# Patient Record
Sex: Female | Born: 1955 | Race: White | Hispanic: No | Marital: Married | Smoking: Never smoker
Health system: Southern US, Community
[De-identification: ages and names within clinical notes are randomized; demographics above are authoritative.]

## PROBLEM LIST (undated history)

## (undated) DIAGNOSIS — E0590 Thyrotoxicosis, unspecified without thyrotoxic crisis or storm: Secondary | ICD-10-CM

## (undated) DIAGNOSIS — F329 Major depressive disorder, single episode, unspecified: Secondary | ICD-10-CM

## (undated) DIAGNOSIS — N12 Tubulo-interstitial nephritis, not specified as acute or chronic: Secondary | ICD-10-CM

## (undated) DIAGNOSIS — E785 Hyperlipidemia, unspecified: Secondary | ICD-10-CM

## (undated) DIAGNOSIS — K219 Gastro-esophageal reflux disease without esophagitis: Secondary | ICD-10-CM

## (undated) DIAGNOSIS — C4491 Basal cell carcinoma of skin, unspecified: Secondary | ICD-10-CM

## (undated) DIAGNOSIS — T7840XA Allergy, unspecified, initial encounter: Secondary | ICD-10-CM

## (undated) DIAGNOSIS — G709 Myoneural disorder, unspecified: Secondary | ICD-10-CM

## (undated) HISTORY — DX: Hyperlipidemia, unspecified: E78.5

## (undated) HISTORY — DX: Tubulo-interstitial nephritis, not specified as acute or chronic: N12

## (undated) HISTORY — DX: Gastro-esophageal reflux disease without esophagitis: K21.9

## (undated) HISTORY — DX: Basal cell carcinoma of skin, unspecified: C44.91

## (undated) HISTORY — DX: Major depressive disorder, single episode, unspecified: F32.9

## (undated) HISTORY — PX: EYE SURGERY: SHX253

## (undated) HISTORY — PX: OTHER SURGICAL HISTORY: SHX169

## (undated) HISTORY — DX: Myoneural disorder, unspecified: G70.9

## (undated) HISTORY — DX: Thyrotoxicosis, unspecified without thyrotoxic crisis or storm: E05.90

## (undated) HISTORY — DX: Allergy, unspecified, initial encounter: T78.40XA

## (undated) HISTORY — PX: DILATION AND CURETTAGE OF UTERUS: SHX78

---
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
## 1983-01-28 DIAGNOSIS — N12 Tubulo-interstitial nephritis, not specified as acute or chronic: Secondary | ICD-10-CM

## 1983-01-28 HISTORY — DX: Tubulo-interstitial nephritis, not specified as acute or chronic: N12

## 2011-03-16 LAB — HM MAMMOGRAPHY

## 2011-03-16 LAB — HM PAP SMEAR

## 2011-03-21 DIAGNOSIS — K219 Gastro-esophageal reflux disease without esophagitis: Secondary | ICD-10-CM | POA: Insufficient documentation

## 2011-03-21 DIAGNOSIS — F411 Generalized anxiety disorder: Secondary | ICD-10-CM | POA: Insufficient documentation

## 2011-03-21 HISTORY — DX: Generalized anxiety disorder: F41.1

## 2012-01-05 DIAGNOSIS — N950 Postmenopausal bleeding: Secondary | ICD-10-CM | POA: Insufficient documentation

## 2012-01-05 DIAGNOSIS — B9689 Other specified bacterial agents as the cause of diseases classified elsewhere: Secondary | ICD-10-CM | POA: Insufficient documentation

## 2012-01-19 DIAGNOSIS — D219 Benign neoplasm of connective and other soft tissue, unspecified: Secondary | ICD-10-CM | POA: Insufficient documentation

## 2012-03-09 ENCOUNTER — Encounter: Payer: Self-pay | Admitting: *Deleted

## 2012-03-09 ENCOUNTER — Other Ambulatory Visit: Payer: Self-pay | Admitting: *Deleted

## 2012-03-11 ENCOUNTER — Ambulatory Visit: Payer: Self-pay | Admitting: Internal Medicine

## 2012-03-15 ENCOUNTER — Encounter: Payer: Self-pay | Admitting: Internal Medicine

## 2012-03-15 ENCOUNTER — Ambulatory Visit (INDEPENDENT_AMBULATORY_CARE_PROVIDER_SITE_OTHER): Payer: BC Managed Care – PPO | Admitting: Internal Medicine

## 2012-03-15 VITALS — BP 120/80 | HR 54 | Temp 98.3°F | Ht 67.71 in | Wt 153.0 lb

## 2012-03-15 DIAGNOSIS — E0590 Thyrotoxicosis, unspecified without thyrotoxic crisis or storm: Secondary | ICD-10-CM

## 2012-03-15 DIAGNOSIS — R5381 Other malaise: Secondary | ICD-10-CM

## 2012-03-15 DIAGNOSIS — C4491 Basal cell carcinoma of skin, unspecified: Secondary | ICD-10-CM

## 2012-03-15 DIAGNOSIS — R5383 Other fatigue: Secondary | ICD-10-CM

## 2012-03-15 DIAGNOSIS — E7800 Pure hypercholesterolemia, unspecified: Secondary | ICD-10-CM

## 2012-03-15 DIAGNOSIS — Z1211 Encounter for screening for malignant neoplasm of colon: Secondary | ICD-10-CM

## 2012-03-17 ENCOUNTER — Encounter: Payer: Self-pay | Admitting: Internal Medicine

## 2012-03-17 ENCOUNTER — Telehealth: Payer: Self-pay | Admitting: Internal Medicine

## 2012-03-17 DIAGNOSIS — C4491 Basal cell carcinoma of skin, unspecified: Secondary | ICD-10-CM | POA: Insufficient documentation

## 2012-03-17 DIAGNOSIS — E0590 Thyrotoxicosis, unspecified without thyrotoxic crisis or storm: Secondary | ICD-10-CM | POA: Insufficient documentation

## 2012-03-17 NOTE — Assessment & Plan Note (Signed)
Off tapazole.  Thyroid function just checked and within normal limits.  Follow. Sees endocrinology.

## 2012-03-17 NOTE — Assessment & Plan Note (Signed)
Followed by dermatology.  Up to date with skin checks.

## 2012-03-17 NOTE — Telephone Encounter (Signed)
Lab appointment 2/25 office visit 6/23 pt aware of both appointments

## 2012-03-17 NOTE — Telephone Encounter (Signed)
Pt left without scheduling appt or labs.  Please schedule her for a fasting lab appt within the next 2 weeks.  Also schedule her for a 4 month follow up (30 min)

## 2012-03-17 NOTE — Progress Notes (Signed)
Subjective:    Patient ID: Amber Richardson, female    DOB: 03-03-1955, 57 y.o.   MRN: [id number]  HPI 57 year old female with past history of hyperthyroidism and basal cell carcinoma who comes in today to follow up on these issues as well as to establish care.  She has been having some issues with increased congestion.  Ears - in barrell.  Throat on fire.  Some increased coughing.  Some congestion with mucus production.  Colored mucus production.  Swallowing ok.  No chest congestion.  No sob.  Symptoms progressing.  She has been seeing an endocrinologist for her thyroid.  Was diagnosed with Graves disease.  Was on Tapazole.  Now off.  Thyroid function just checked and wnl.  She reports some increased fatigue and decreased energy.  Hands and feet cold.  More irritable.  Gets angry - quicker.  Dry skin.  Cold all of the time.  Has been seeing GYN (Dr Agapito Games).  Had previously had some post menopausal spotting.  Had work up - negative.  Some pain with intercourse.  Dryness.  Has cervical prolapse.     Past Medical History  Diagnosis Date  . Pyelonephritis 1985  . Hyperthyroidism   . GERD (gastroesophageal reflux disease)   . Allergy   . Basal cell carcinoma     Outpatient Encounter Prescriptions as of 03/15/2012  Medication Sig Dispense Refill  . ALPRAZolam (XANAX) 0.25 MG tablet Take 0.25 mg by mouth at bedtime as needed for sleep.      . citalopram (CELEXA) 20 MG tablet Take 20 mg by mouth daily.      Marland Kitchen ibuprofen (ADVIL,MOTRIN) 600 MG tablet Take 600 mg by mouth as needed for pain.      . Multiple Vitamin (MULTIVITAMIN) tablet Take 1 tablet by mouth daily.      . valACYclovir (VALTREX) 500 MG tablet Take one to two tablets by mouth daily      . Prenatal Vit-Fe Fumarate-FA (PRENATAL MULTIVITAMIN) TABS Take 1 tablet by mouth daily.       No facility-administered encounter medications on file as of 03/15/2012.    Review of Systems Patient denies any headache, lightheadedness or  dizziness.  She does report the increased congestion, sinus issues and sore throat as outlined.  No chest pain, tightness or palpitations.  No increased shortness of breath.  No chest congestion.   No nausea or vomiting.  No acid reflux.  No abdominal pain or cramping.  No bowel change, such as diarrhea, constipation, BRBPR or melana.  No urine change.  Some vaginal dryness as outlined.  No further spotting.  Decreased energy.       Objective:   Physical Exam Filed Vitals:   03/15/12 1618  BP: 120/80  Pulse: 54  Temp: 98.3 F (36.8 C)   Pulse 2  57 year old female in no acute distress.   HEENT:  Nares- clear/slightly erythematous turbinates.   Oropharynx - without lesions.  TMs without erythema.  No significant tenderness to palpation over the sinus.  NECK:  Supple.  Nontender.  No audible bruit.  HEART:  Appears to be regular. LUNGS:  No crackles or wheezing audible.  Respirations even and unlabored.  RADIAL PULSE:  Equal bilaterally.    BREASTS:  Exam performed by gyn.  ABDOMEN:  Soft, nontender.  Bowel sounds present and normal.  No audible abdominal bruit.  GU:  Exam performed by gyn.    EXTREMITIES:  No increased edema present.  DP pulses  palpable and equal bilaterally.   SKIN:  No increased erythema or discoloration of the lower extremities.           Assessment & Plan:  FATIGUE.  Worsening.  See above.  Will check cbc, met c and tsh.    CARDIOVASCULAR.  No chest pain or tightness.  Currently asymptomatic.   PROBABLE SINUSITIS/URI.  Will have her use saline nasal spray and restart Flonase.  Gave her a rx for amoxicillin.  If progression or worsening or symptoms, can start abx.    PSYCH.  Describes being more irritable.  Increased stress.  Takes xanax regularly.  On citalopram.  She just saw her therapist today [employer]).  Plans to increase her citalopram to 20mg  q day.  Follow.    MSK.  Describes cold feet and hands.  Circulation appears to be ok.  No skin changes  visible.  Check ESR.    HEALTH MAINTENANCE.  Gets her breast, pelvic and pap smears through gyn.  Mammograms through there as well.  Has not had a colonoscopy.  Will refer for screening colonoscopy.

## 2012-03-23 ENCOUNTER — Other Ambulatory Visit (INDEPENDENT_AMBULATORY_CARE_PROVIDER_SITE_OTHER): Payer: BC Managed Care – PPO

## 2012-03-23 DIAGNOSIS — R5381 Other malaise: Secondary | ICD-10-CM

## 2012-03-23 DIAGNOSIS — E7800 Pure hypercholesterolemia, unspecified: Secondary | ICD-10-CM

## 2012-03-23 LAB — COMPREHENSIVE METABOLIC PANEL
ALT: 23 U/L (ref 0–35)
AST: 28 U/L (ref 0–37)
Alkaline Phosphatase: 76 U/L (ref 39–117)
CO2: 29 mEq/L (ref 19–32)
Chloride: 105 mEq/L (ref 96–112)
Potassium: 4.6 mEq/L (ref 3.5–5.1)
Total Bilirubin: 0.6 mg/dL (ref 0.3–1.2)
Total Protein: 6.8 g/dL (ref 6.0–8.3)

## 2012-03-23 LAB — CBC WITH DIFFERENTIAL/PLATELET
Basophils Absolute: 0 10*3/uL (ref 0.0–0.1)
HCT: 39.1 % (ref 36.0–46.0)
Lymphs Abs: 2.4 10*3/uL (ref 0.7–4.0)
MCV: 96.5 fl (ref 78.0–100.0)
Monocytes Absolute: 0.5 10*3/uL (ref 0.1–1.0)
Platelets: 203 10*3/uL (ref 150.0–400.0)
RDW: 12.7 % (ref 11.5–14.6)

## 2012-03-23 LAB — LDL CHOLESTEROL, DIRECT: Direct LDL: 165.6 mg/dL

## 2012-03-23 LAB — LIPID PANEL
Cholesterol: 261 mg/dL — ABNORMAL HIGH (ref 0–200)
Total CHOL/HDL Ratio: 3

## 2012-05-10 ENCOUNTER — Ambulatory Visit: Payer: Self-pay | Admitting: Internal Medicine

## 2012-07-19 ENCOUNTER — Encounter: Payer: Self-pay | Admitting: Internal Medicine

## 2012-07-19 ENCOUNTER — Ambulatory Visit (INDEPENDENT_AMBULATORY_CARE_PROVIDER_SITE_OTHER): Payer: BC Managed Care – PPO | Admitting: Internal Medicine

## 2012-07-19 VITALS — BP 110/70 | HR 62 | Temp 98.0°F | Ht 67.71 in | Wt 152.2 lb

## 2012-07-19 DIAGNOSIS — E7800 Pure hypercholesterolemia, unspecified: Secondary | ICD-10-CM

## 2012-07-19 DIAGNOSIS — C4491 Basal cell carcinoma of skin, unspecified: Secondary | ICD-10-CM

## 2012-07-19 DIAGNOSIS — E0590 Thyrotoxicosis, unspecified without thyrotoxic crisis or storm: Secondary | ICD-10-CM

## 2012-07-19 MED ORDER — METAXALONE 800 MG PO TABS: 800.0000 mg | ORAL_TABLET | Freq: Every evening | ORAL | Status: DC | PRN

## 2012-07-30 ENCOUNTER — Encounter: Payer: Self-pay | Admitting: Internal Medicine

## 2012-07-30 NOTE — Progress Notes (Signed)
Subjective:    Patient ID: Amber Richardson, female    DOB: 1955-05-22, 57 y.o.   MRN: [id number]  HPI 57 year old female with past history of hyperthyroidism and basal cell carcinoma who comes in today for a scheduled follow up.  No chest congestion.  No sob.  She has been seeing an endocrinologist for her thyroid.  Was diagnosed with Graves disease.  Was on Tapazole.  Now off.  Thyroid function just checked and wnl.  Last visit we decreased her citalopram to 10mg  q day.  She feels better on this dose.  Work is better.  She did get hit with a shelf.  A shelf fell down stairs and she was knocked on her back.  No head injury.  (Workman's comp).  Still with pain in her upper shoulders, neck and mid back.  She was evaluated.  Had xray T-spine, C-spine, L-spine, right forearm and right hip.  Has been taking Flexeril and Naproxen.  Tolerates skelaxin better.  Went to physical therapy for a few weeks.       Past Medical History  Diagnosis Date  . Pyelonephritis 1985  . Hyperthyroidism   . GERD (gastroesophageal reflux disease)   . Allergy   . Basal cell carcinoma     Outpatient Encounter Prescriptions as of 07/19/2012  Medication Sig Dispense Refill  . ALPRAZolam (XANAX) 0.25 MG tablet Take 0.25 mg by mouth at bedtime as needed for sleep.      . citalopram (CELEXA) 20 MG tablet Take 20 mg by mouth daily.      Marland Kitchen ibuprofen (ADVIL,MOTRIN) 600 MG tablet Take 600 mg by mouth as needed for pain.      . Multiple Vitamin (MULTIVITAMIN) tablet Take 1 tablet by mouth daily.      . valACYclovir (VALTREX) 500 MG tablet Take one to two tablets by mouth daily      . metaxalone (SKELAXIN) 800 MG tablet Take 1 tablet (800 mg total) by mouth at bedtime as needed for pain.  30 tablet  0  . [DISCONTINUED] Prenatal Vit-Fe Fumarate-FA (PRENATAL MULTIVITAMIN) TABS Take 1 tablet by mouth daily.       No facility-administered encounter medications on file as of 07/19/2012.    Review of Systems Patient denies any  headache, lightheadedness or dizziness.  Previous congestion has resolved.  No chest pain, tightness or palpitations.  No increased shortness of breath.  No chest congestion.   No nausea or vomiting.  No acid reflux.  No abdominal pain or cramping.  No bowel change, such as diarrhea, constipation, BRBPR or melana.  No urine change.  Feels better since decreasing the dose of citalopram.  Persistent shoulder, neck and mid back pain as outlined.         Objective:   Physical Exam  Filed Vitals:   07/19/12 1130  BP: 110/70  Pulse: 62  Temp: 98 F (18.32 C)   57 year old female in no acute distress.   HEENT:  Nares- clear.   Oropharynx - without lesions.   NECK:  Supple.  Nontender.  No audible bruit.  HEART:  Appears to be regular. LUNGS:  No crackles or wheezing audible.  Respirations even and unlabored.  RADIAL PULSE:  Equal bilaterally.   ABDOMEN:  Soft, nontender.  Bowel sounds present and normal.  No audible abdominal bruit.    EXTREMITIES:  No increased edema present.  DP pulses palpable and equal bilaterally.   MSK:  Some minimal discomfort with palpation over the neck  and upper back.  Some minimal discomfort with rotation of her head.           Assessment & Plan:  FATIGUE.  Better since decreasing citalopram dose.     CARDIOVASCULAR.  No chest pain or tightness.  Currently asymptomatic.    PSYCH.  On citalopram.  Feeling better on the lower dose.  Sees a therapist [employer]).  Work is better.  Follow.   MSK.  Persistent pain s/p injury.  Wants to continue to monitor.  Will notify me if desires any further testing or work up.       HEALTH MAINTENANCE.  Gets her breast, pelvic and pap smears through gyn.  Mammograms through there as well.  Has not had a colonoscopy.  Was referred for screening colonoscopy.

## 2012-07-30 NOTE — Assessment & Plan Note (Signed)
Followed by dermatology.  Up to date with skin checks.

## 2012-07-30 NOTE — Assessment & Plan Note (Signed)
Off tapazole.  Thyroid function just checked and within normal limits.  Follow. Sees endocrinology.

## 2012-09-08 LAB — HM COLONOSCOPY

## 2012-09-28 ENCOUNTER — Encounter: Payer: Self-pay | Admitting: Internal Medicine

## 2012-09-28 DIAGNOSIS — M6780 Other specified disorders of synovium and tendon, unspecified site: Secondary | ICD-10-CM

## 2012-10-14 ENCOUNTER — Encounter: Payer: Self-pay | Admitting: Internal Medicine

## 2012-12-02 ENCOUNTER — Other Ambulatory Visit: Payer: Self-pay

## 2012-12-16 ENCOUNTER — Ambulatory Visit: Payer: Self-pay | Admitting: Unknown Physician Specialty

## 2013-09-12 DIAGNOSIS — M6780 Other specified disorders of synovium and tendon, unspecified site: Secondary | ICD-10-CM

## 2013-09-12 HISTORY — DX: Other specified disorders of synovium and tendon, unspecified site: M67.80

## 2014-08-29 ENCOUNTER — Other Ambulatory Visit: Payer: Self-pay | Admitting: Unknown Physician Specialty

## 2014-08-29 ENCOUNTER — Encounter: Payer: Self-pay | Admitting: Internal Medicine

## 2014-08-29 ENCOUNTER — Ambulatory Visit (INDEPENDENT_AMBULATORY_CARE_PROVIDER_SITE_OTHER): Payer: 59 | Admitting: Internal Medicine

## 2014-08-29 VITALS — BP 110/70 | HR 52 | Temp 98.0°F | Ht 67.5 in | Wt 154.2 lb

## 2014-08-29 DIAGNOSIS — Z13220 Encounter for screening for lipoid disorders: Secondary | ICD-10-CM

## 2014-08-29 DIAGNOSIS — M79661 Pain in right lower leg: Secondary | ICD-10-CM

## 2014-08-29 DIAGNOSIS — R1084 Generalized abdominal pain: Secondary | ICD-10-CM

## 2014-08-29 DIAGNOSIS — E0590 Thyrotoxicosis, unspecified without thyrotoxic crisis or storm: Secondary | ICD-10-CM

## 2014-08-29 DIAGNOSIS — R109 Unspecified abdominal pain: Secondary | ICD-10-CM | POA: Insufficient documentation

## 2014-08-29 DIAGNOSIS — R079 Chest pain, unspecified: Secondary | ICD-10-CM | POA: Diagnosis not present

## 2014-08-29 DIAGNOSIS — Z658 Other specified problems related to psychosocial circumstances: Secondary | ICD-10-CM | POA: Diagnosis not present

## 2014-08-29 DIAGNOSIS — F439 Reaction to severe stress, unspecified: Secondary | ICD-10-CM

## 2014-08-29 DIAGNOSIS — Z0000 Encounter for general adult medical examination without abnormal findings: Secondary | ICD-10-CM

## 2014-08-29 DIAGNOSIS — R5383 Other fatigue: Secondary | ICD-10-CM

## 2014-08-29 LAB — CBC WITH DIFFERENTIAL/PLATELET
BASOS ABS: 0 10*3/uL (ref 0.0–0.1)
BASOS PCT: 0.6 % (ref 0.0–3.0)
EOS ABS: 0 10*3/uL (ref 0.0–0.7)
Eosinophils Relative: 0.6 % (ref 0.0–5.0)
HCT: 42.1 % (ref 36.0–46.0)
Hemoglobin: 13.9 g/dL (ref 12.0–15.0)
LYMPHS PCT: 40.5 % (ref 12.0–46.0)
Lymphs Abs: 2.2 10*3/uL (ref 0.7–4.0)
MCHC: 33 g/dL (ref 30.0–36.0)
MCV: 97 fl (ref 78.0–100.0)
MONOS PCT: 8.4 % (ref 3.0–12.0)
Monocytes Absolute: 0.4 10*3/uL (ref 0.1–1.0)
Neutro Abs: 2.7 10*3/uL (ref 1.4–7.7)
Neutrophils Relative %: 49.9 % (ref 43.0–77.0)
PLATELETS: 212 10*3/uL (ref 150.0–400.0)
RBC: 4.34 Mil/uL (ref 3.87–5.11)
RDW: 13.7 % (ref 11.5–15.5)
WBC: 5.4 10*3/uL (ref 4.0–10.5)

## 2014-08-29 LAB — HEPATIC FUNCTION PANEL
ALBUMIN: 4.7 g/dL (ref 3.5–5.2)
ALT: 30 U/L (ref 0–35)
AST: 31 U/L (ref 0–37)
Alkaline Phosphatase: 107 U/L (ref 39–117)
BILIRUBIN TOTAL: 0.7 mg/dL (ref 0.2–1.2)
Bilirubin, Direct: 0.1 mg/dL (ref 0.0–0.3)
Total Protein: 7 g/dL (ref 6.0–8.3)

## 2014-08-29 LAB — AMYLASE: Amylase: 43 U/L (ref 27–131)

## 2014-08-29 LAB — BASIC METABOLIC PANEL
BUN: 14 mg/dL (ref 6–23)
CHLORIDE: 100 meq/L (ref 96–112)
CO2: 33 mEq/L — ABNORMAL HIGH (ref 19–32)
CREATININE: 0.76 mg/dL (ref 0.40–1.20)
Calcium: 9.6 mg/dL (ref 8.4–10.5)
GFR: 83.48 mL/min (ref >60.00)
GLUCOSE: 76 mg/dL (ref 70–99)
POTASSIUM: 4.5 meq/L (ref 3.5–5.1)
SODIUM: 138 meq/L (ref 135–145)

## 2014-08-29 LAB — LIPASE: LIPASE: 21 U/L (ref 11.0–59.0)

## 2014-08-29 LAB — LIPID PANEL
Cholesterol: 292 mg/dL — ABNORMAL HIGH (ref 0–200)
HDL: 77.3 mg/dL (ref >39.00)
LDL CALC: 185 mg/dL — AB (ref 0–99)
NonHDL: 214.27
Total CHOL/HDL Ratio: 4
Triglycerides: 144 mg/dL (ref 0.0–149.0)
VLDL: 28.8 mg/dL (ref 0.0–40.0)

## 2014-08-29 LAB — TSH: TSH: 4.91 u[IU]/mL — AB (ref 0.35–4.50)

## 2014-08-29 NOTE — Progress Notes (Signed)
Pre visit review using our clinic review tool, if applicable. No additional management support is needed unless otherwise documented below in the visit note. 

## 2014-08-29 NOTE — Progress Notes (Signed)
Patient ID: Amber Richardson, female   DOB: August 31, 1955, 59 y.o.   MRN: [id number]   Subjective:    Patient ID: Amber Richardson, female    DOB: 05/14/1955, 59 y.o.   MRN: [id number]  HPI  Patient here for a follow up.  She was scheduled for a physical, but she sees gyn for her breast, pelvic and pap smears.  Is up to date.  Has f/u planned in 09/2014.  She is seeing psychiatry.  On cymbalta now.  Takes xanax bid.  Feels she is doing better on the cymbalta.  She has developed RUQ pain and right side pain.  She also describes central pelvic pain.  Had urinalysis yesterday - negative.  No nausea or vomiting.  No bowel change.  She also reports increased fatigue, weight gain and has been more angry and more irritable.  She also reports dry skin and thin nails.  Due to see Dr Tiffany Kocher this pm.  No reflux.  She also reports pain - right calf - localized pain.  Wears compression hose.  Previously noticed some chest heaviness. Fatigue as outlined.     Past Medical History  Diagnosis Date  . Pyelonephritis 1985  . Hyperthyroidism   . GERD (gastroesophageal reflux disease)   . Allergy   . Basal cell carcinoma     Outpatient Encounter Prescriptions as of 08/29/2014  Medication Sig  . ALPRAZolam (XANAX) 0.25 MG tablet Take 0.25 mg by mouth 3 (three) times daily as needed for anxiety or sleep.   . DULoxetine (CYMBALTA) 20 MG capsule Take 20 mg by mouth daily.  Marland Kitchen ibuprofen (ADVIL,MOTRIN) 600 MG tablet Take 600 mg by mouth as needed for pain.  . metaxalone (SKELAXIN) 800 MG tablet Take 1 tablet (800 mg total) by mouth at bedtime as needed for pain.  . Multiple Vitamin (MULTIVITAMIN) tablet Take 1 tablet by mouth daily.  . valACYclovir (VALTREX) 500 MG tablet Take one to two tablets by mouth daily  . [DISCONTINUED] citalopram (CELEXA) 20 MG tablet Take 20 mg by mouth daily.   No facility-administered encounter medications on file as of 08/29/2014.    Review of Systems  Constitutional: Positive for fatigue.         Concerned about weight gain.    HENT: Negative for congestion and sinus pressure.   Respiratory: Negative for cough, chest tightness and shortness of breath.   Cardiovascular: Negative for chest pain, palpitations and leg swelling.  Gastrointestinal: Positive for abdominal pain (RUQ and right side pain.  ). Negative for nausea, vomiting and diarrhea.  Genitourinary: Negative for dysuria and difficulty urinating.  Musculoskeletal: Negative for joint swelling and neck pain.  Skin: Negative for color change and rash.  Neurological: Negative for dizziness, light-headedness and headaches.  Psychiatric/Behavioral: Negative for dysphoric mood and agitation.       Objective:    Physical Exam  Constitutional: She appears well-developed and well-nourished. No distress.  HENT:  Nose: Nose normal.  Mouth/Throat: Oropharynx is clear and moist.  Neck: Neck supple. No thyromegaly present.  Cardiovascular: Normal rate and regular rhythm.   Pulmonary/Chest: Breath sounds normal. No respiratory distress. She has no wheezes.  Abdominal: Soft. Bowel sounds are normal.  Minimal tenderness to palpation - right side.   Musculoskeletal: She exhibits no edema or tenderness.  Lymphadenopathy:    She has no cervical adenopathy.  Skin: No rash noted. No erythema.  Psychiatric: She has a normal mood and affect. Her behavior is normal.    BP 110/70 mmHg  Pulse  52  Temp(Src) 98 F (36.7 C) (Oral)  Ht 5' 7.5" (1.715 m)  Wt 154 lb 4 oz (69.967 kg)  BMI 23.79 kg/m2  SpO2 98%  LMP 01/13/2008 Wt Readings from Last 3 Encounters:  08/29/14 154 lb 4 oz (69.967 kg)  07/19/12 152 lb 4 oz (69.06 kg)  03/15/12 153 lb (69.4 kg)     Lab Results  Component Value Date   WBC 5.4 08/29/2014   HGB 13.9 08/29/2014   HCT 42.1 08/29/2014   PLT 212.0 08/29/2014   GLUCOSE 76 08/29/2014   CHOL 292* 08/29/2014   TRIG 144.0 08/29/2014   HDL 77.30 08/29/2014   LDLDIRECT 165.6 03/23/2012   LDLCALC 185*  08/29/2014   ALT 30 08/29/2014   AST 31 08/29/2014   NA 138 08/29/2014   K 4.5 08/29/2014   CL 100 08/29/2014   CREATININE 0.76 08/29/2014   BUN 14 08/29/2014   CO2 33* 08/29/2014   TSH 4.91* 08/29/2014       Assessment & Plan:   Problem List Items Addressed This Visit    Abdominal pain - Primary   Relevant Orders   TSH (Completed)   CBC with Differential/Platelet (Completed)   Hepatic function panel (Completed)   Basic metabolic panel (Completed)   Amylase (Completed)   Lipase (Completed)   Chest pain    Chest tightness as outlined.  Discussed further cardiac w/up.  Will refer to cardiology for further evaluation and testing.   Has strong family history.        Relevant Orders   EKG 12-Lead (Completed)   Ambulatory referral to Cardiology   Fatigue    Check cbc, metabolic panel and thyroid test.  Further w/up for abdominal pain as outlined.        Health care maintenance    Sees gyn.  Up to date with breast, pelvic and pap smears.  Also states up to date with mammograms.  Will need to get records.        Hyperthyroidism    Off tapazole.  Check thyroid function tests.        Right calf pain    No erythema.  No significant pain to palpation.  Continue support hose.  Follow.        Right sided abdominal pain    Persistent pain.  Symptoms as outlined.  Discussed further w/up.  We discussed CT.  She is seeing Dr Tiffany Kocher this pm.  Will discuss further w/up with him.  Check cbc, liver panel, pancreas tests, etc.        Stress    On cymbalta.  Seeing psychiatry.         Other Visit Diagnoses    Screening cholesterol level        Relevant Orders    Lipid panel (Completed)      I spent 40 minutes with the patient and more than 50% of the time was spent in consultation regarding the above.     Einar Pheasant, MD

## 2014-08-30 ENCOUNTER — Encounter: Payer: Self-pay | Admitting: Internal Medicine

## 2014-08-30 DIAGNOSIS — M79661 Pain in right lower leg: Secondary | ICD-10-CM | POA: Insufficient documentation

## 2014-08-30 DIAGNOSIS — F439 Reaction to severe stress, unspecified: Secondary | ICD-10-CM | POA: Insufficient documentation

## 2014-08-30 DIAGNOSIS — Z0000 Encounter for general adult medical examination without abnormal findings: Secondary | ICD-10-CM | POA: Insufficient documentation

## 2014-08-30 DIAGNOSIS — R5383 Other fatigue: Secondary | ICD-10-CM

## 2014-08-30 DIAGNOSIS — R079 Chest pain, unspecified: Secondary | ICD-10-CM | POA: Insufficient documentation

## 2014-08-30 DIAGNOSIS — R109 Unspecified abdominal pain: Secondary | ICD-10-CM | POA: Insufficient documentation

## 2014-08-30 HISTORY — DX: Other fatigue: R53.83

## 2014-08-30 NOTE — Assessment & Plan Note (Signed)
On cymbalta.  Seeing psychiatry.

## 2014-08-30 NOTE — Assessment & Plan Note (Signed)
Off tapazole.  Check thyroid function tests.

## 2014-08-30 NOTE — Assessment & Plan Note (Signed)
Chest tightness as outlined.  Discussed further cardiac w/up.  Will refer to cardiology for further evaluation and testing.   Has strong family history.

## 2014-08-30 NOTE — Assessment & Plan Note (Signed)
Persistent pain.  Symptoms as outlined.  Discussed further w/up.  We discussed CT.  She is seeing Dr Tiffany Kocher this pm.  Will discuss further w/up with him.  Check cbc, liver panel, pancreas tests, etc.

## 2014-08-30 NOTE — Assessment & Plan Note (Signed)
Sees gyn.  Up to date with breast, pelvic and pap smears.  Also states up to date with mammograms.  Will need to get records.

## 2014-08-30 NOTE — Assessment & Plan Note (Signed)
No erythema.  No significant pain to palpation.  Continue support hose.  Follow.

## 2014-08-30 NOTE — Assessment & Plan Note (Signed)
Check cbc, metabolic panel and thyroid test.  Further w/up for abdominal pain as outlined.

## 2014-09-01 NOTE — Telephone Encounter (Signed)
Unread mychart message mailed to patient 

## 2014-09-06 ENCOUNTER — Ambulatory Visit
Admission: RE | Admit: 2014-09-06 | Discharge: 2014-09-06 | Disposition: A | Discharge status: Home / Self Care | Payer: 59 | Source: Ambulatory Visit | Attending: Unknown Physician Specialty | Admitting: Unknown Physician Specialty

## 2014-09-06 DIAGNOSIS — R1084 Generalized abdominal pain: Secondary | ICD-10-CM | POA: Insufficient documentation

## 2014-09-20 DIAGNOSIS — N952 Postmenopausal atrophic vaginitis: Secondary | ICD-10-CM | POA: Insufficient documentation

## 2014-09-20 DIAGNOSIS — R102 Pelvic and perineal pain: Secondary | ICD-10-CM | POA: Insufficient documentation

## 2014-09-20 DIAGNOSIS — B009 Herpesviral infection, unspecified: Secondary | ICD-10-CM

## 2014-09-20 HISTORY — DX: Herpesviral infection, unspecified: B00.9

## 2014-10-27 ENCOUNTER — Encounter: Payer: Self-pay | Admitting: Internal Medicine

## 2014-10-27 ENCOUNTER — Ambulatory Visit
Admission: RE | Admit: 2014-10-27 | Discharge: 2014-10-27 | Disposition: A | Discharge status: Home / Self Care | Payer: 59 | Source: Ambulatory Visit | Attending: Internal Medicine | Admitting: Internal Medicine

## 2014-10-27 ENCOUNTER — Ambulatory Visit (INDEPENDENT_AMBULATORY_CARE_PROVIDER_SITE_OTHER): Payer: 59 | Admitting: Internal Medicine

## 2014-10-27 VITALS — BP 120/78 | HR 53 | Temp 97.9°F | Resp 18 | Ht 67.5 in | Wt 158.8 lb

## 2014-10-27 DIAGNOSIS — R1084 Generalized abdominal pain: Secondary | ICD-10-CM | POA: Diagnosis not present

## 2014-10-27 DIAGNOSIS — Z658 Other specified problems related to psychosocial circumstances: Secondary | ICD-10-CM | POA: Diagnosis not present

## 2014-10-27 DIAGNOSIS — M25572 Pain in left ankle and joints of left foot: Secondary | ICD-10-CM | POA: Diagnosis not present

## 2014-10-27 DIAGNOSIS — M79672 Pain in left foot: Secondary | ICD-10-CM

## 2014-10-27 DIAGNOSIS — F439 Reaction to severe stress, unspecified: Secondary | ICD-10-CM

## 2014-10-27 NOTE — Progress Notes (Signed)
Pre visit review using our clinic review tool, if applicable. No additional management support is needed unless otherwise documented below in the visit note. 

## 2014-10-27 NOTE — Progress Notes (Signed)
Patient ID: Amber Richardson, female   DOB: [birth year], 57 y.o.   MRN: [id number]   Subjective:    Patient ID: Amber Richardson, female    DOB: 15-Jan-1956, 59 y.o.   MRN: [id number]  HPI  Patient with past history of uterine fibroids and increased stress.  She comes in today for a scheduled follow up.  She is accompanied by her husband.  History obtained from both of them.  Her abdominal pain is better.  Saw gyn.  Had pelvic ultrasound.  Stable fibroids.  Had abdominal ultrasound with results as outlined.  Her main complaint now is that of left ler leg pain (ankle pain) and pain in her feet.  Describes burning around the left ankle.  Increased pain in her foot - with inversion.  No cardiac symptoms with increased activity or exertion.  No sob.     Past Medical History  Diagnosis Date  . Pyelonephritis 1985  . Hyperthyroidism   . GERD (gastroesophageal reflux disease)   . Allergy   . Basal cell carcinoma    Past Surgical History  Procedure Laterality Date  . Tonsillectomy  1963  . Miscarriage      x1  . Dilation and curettage of uterus     Family History  Problem Relation Age of Onset  . Hypertension Mother   . Heart disease Father   . Hyperlipidemia Father   . Hypercholesterolemia Father   . Hypercholesterolemia Mother    Social History   Social History  . Marital Status: Married    Spouse Name: N/A  . Number of Children: 0  . Years of Education: N/A   Social History Main Topics  . Smoking status: Never Smoker   . Smokeless tobacco: Never Used  . Alcohol Use: No  . Drug Use: No  . Sexual Activity: Not Asked   Other Topics Concern  . None   Social History Narrative    Outpatient Encounter Prescriptions as of 10/27/2014  Medication Sig  . ALPRAZolam (XANAX) 0.25 MG tablet Take 0.25 mg by mouth 3 (three) times daily as needed for anxiety or sleep.   . DULoxetine (CYMBALTA) 20 MG capsule Take 20 mg by mouth daily.  Marland Kitchen ibuprofen (ADVIL,MOTRIN) 600 MG tablet Take 600  mg by mouth as needed for pain.  . Multiple Vitamin (MULTIVITAMIN) tablet Take 1 tablet by mouth daily.  . valACYclovir (VALTREX) 500 MG tablet Take one to two tablets by mouth daily  . [DISCONTINUED] metaxalone (SKELAXIN) 800 MG tablet Take 1 tablet (800 mg total) by mouth at bedtime as needed for pain.   No facility-administered encounter medications on file as of 10/27/2014.    Review of Systems  Constitutional: Negative for appetite change and unexpected weight change.  HENT: Negative for congestion and sinus pressure.   Eyes: Negative for pain and visual disturbance.  Respiratory: Negative for cough, chest tightness and shortness of breath.   Cardiovascular: Negative for chest pain, palpitations and leg swelling.  Gastrointestinal: Negative for nausea, vomiting, abdominal pain and diarrhea.  Genitourinary: Negative for dysuria and difficulty urinating.  Musculoskeletal:       Increased foot and leg pain as outlined.    Skin: Negative for color change and rash.  Neurological: Negative for dizziness, light-headedness and headaches.  Psychiatric/Behavioral: Negative for dysphoric mood and agitation.       Increased stress with her job.        Objective:    Physical Exam  Constitutional: She appears well-developed and well-nourished. No distress.  HENT:  Nose: Nose normal.  Mouth/Throat: Oropharynx is clear and moist.  Eyes: Conjunctivae are normal. Right eye exhibits no discharge. Left eye exhibits no discharge.  Neck: Neck supple. No thyromegaly present.  Cardiovascular: Normal rate and regular rhythm.   Pulmonary/Chest: Breath sounds normal. No respiratory distress. She has no wheezes.  Abdominal: Soft. Bowel sounds are normal. There is no tenderness.  Musculoskeletal: She exhibits no edema or tenderness.  Lymphadenopathy:    She has no cervical adenopathy.  Skin: No rash noted. No erythema.  Psychiatric: She has a normal mood and affect. Her behavior is normal.    BP  120/78 mmHg  Pulse 53  Temp(Src) 97.9 F (36.6 C) (Oral)  Resp 18  Ht 5' 7.5" (1.715 m)  Wt 158 lb 12 oz (72.009 kg)  BMI 24.48 kg/m2  SpO2 96%  LMP 01/13/2008 Wt Readings from Last 3 Encounters:  10/27/14 158 lb 12 oz (72.009 kg)  08/29/14 154 lb 4 oz (69.967 kg)  07/19/12 152 lb 4 oz (69.06 kg)     Lab Results  Component Value Date   WBC 5.4 08/29/2014   HGB 13.9 08/29/2014   HCT 42.1 08/29/2014   PLT 212.0 08/29/2014   GLUCOSE 76 08/29/2014   CHOL 292* 08/29/2014   TRIG 144.0 08/29/2014   HDL 77.30 08/29/2014   LDLDIRECT 165.6 03/23/2012   LDLCALC 185* 08/29/2014   ALT 30 08/29/2014   AST 31 08/29/2014   NA 138 08/29/2014   K 4.5 08/29/2014   CL 100 08/29/2014   CREATININE 0.76 08/29/2014   BUN 14 08/29/2014   CO2 33* 08/29/2014   TSH 4.91* 08/29/2014    US Abdomen Complete  09/06/2014   CLINICAL DATA:  2-3 month history of generalized abdominal pain without nausea or vomiting.  EXAM: ULTRASOUND ABDOMEN COMPLETE  COMPARISON:  Abdominal and pelvic CT scan dated December 16, 2012  FINDINGS: Gallbladder: No gallstones or wall thickening visualized. No sonographic Murphy sign noted.  Common bile duct: Diameter: 1.6 cm  Liver: No focal lesion identified. Within normal limits in parenchymal echogenicity.  IVC: No abnormality visualized.  Pancreas: The pancreatic head and body are normal in echotexture and contour. The pancreatic tail is obscured by bowel gas.  Spleen: Size and appearance within normal limits.  Right Kidney: Length: 10.8 cm. In the upper there is a 6 x 8 x 10 mm hypoechoic focus with enhanced through transmission and a mural echogenic focus. Elsewhere the cortical echotexture is normal. There is no hydronephrosis.  Left Kidney: Length: 12 cm. In the mid pole there is a 9 x 6 x 7 mm cystic structure with focal calcification. There is no hydronephrosis.  Abdominal aorta: No aneurysm visualized.  Other findings: There is no ascites.  IMPRESSION: 1. There is no acute  hepatobiliary abnormality. The pancreatic tail was obscured by bowel gas. 2. No acute renal abnormality is observed. There are tiny cystic appearing structures with possible mural calcifications bilaterally.   Electronically Signed   By: David  Martinique M.D.   On: 09/06/2014 11:14       Assessment & Plan:   Problem List Items Addressed This Visit    Abdominal pain    Abdominal and pelvic ultrasound as outlined.  Has resolved.  Saw gyn.  Follow.       Left foot pain    Persistent left foot and ankle pain.  Has seen podiatry.  Tried orthotics.  Will xray.  Will probably need referral back to podiatry.  Stress    Followed by psychiatry.  On cymbalta.         Other Visit Diagnoses    Foot pain, left    -  Primary    Relevant Orders    DG Foot 2 Views Left (Completed)    Left ankle pain        Relevant Orders    DG Ankle 2 Views Left (Completed)        Einar Pheasant, MD

## 2014-10-28 ENCOUNTER — Encounter: Payer: Self-pay | Admitting: Internal Medicine

## 2014-10-28 DIAGNOSIS — M79672 Pain in left foot: Secondary | ICD-10-CM

## 2014-10-31 NOTE — Telephone Encounter (Signed)
Unread mychart message mailed to patient 

## 2014-11-02 NOTE — Addendum Note (Signed)
Addended by: Alisa Graff on: 11/02/2014 03:52 AM   Modules accepted: Orders

## 2014-11-02 NOTE — Telephone Encounter (Signed)
Order placed for referral to podiatry.   

## 2014-11-04 ENCOUNTER — Encounter: Payer: Self-pay | Admitting: Internal Medicine

## 2014-11-04 DIAGNOSIS — M79672 Pain in left foot: Secondary | ICD-10-CM | POA: Insufficient documentation

## 2014-11-04 NOTE — Assessment & Plan Note (Signed)
Abdominal and pelvic ultrasound as outlined.  Has resolved.  Saw gyn.  Follow.

## 2014-11-04 NOTE — Assessment & Plan Note (Signed)
Followed by psychiatry.  On cymbalta.

## 2014-11-04 NOTE — Assessment & Plan Note (Signed)
Persistent left foot and ankle pain.  Has seen podiatry.  Tried orthotics.  Will xray.  Will probably need referral back to podiatry.

## 2014-11-15 ENCOUNTER — Other Ambulatory Visit: Payer: Self-pay | Admitting: Podiatry

## 2014-11-15 DIAGNOSIS — M25572 Pain in left ankle and joints of left foot: Secondary | ICD-10-CM

## 2014-11-22 ENCOUNTER — Ambulatory Visit
Admission: RE | Admit: 2014-11-22 | Discharge: 2014-11-22 | Disposition: A | Discharge status: Home / Self Care | Payer: 59 | Source: Ambulatory Visit | Attending: Podiatry | Admitting: Podiatry

## 2014-11-22 DIAGNOSIS — M25572 Pain in left ankle and joints of left foot: Secondary | ICD-10-CM | POA: Insufficient documentation

## 2014-11-22 DIAGNOSIS — M659 Synovitis and tenosynovitis, unspecified: Secondary | ICD-10-CM | POA: Diagnosis present

## 2015-01-25 ENCOUNTER — Ambulatory Visit: Payer: 59 | Admitting: Internal Medicine

## 2015-02-08 ENCOUNTER — Encounter: Payer: Self-pay | Admitting: Internal Medicine

## 2015-02-08 ENCOUNTER — Ambulatory Visit (INDEPENDENT_AMBULATORY_CARE_PROVIDER_SITE_OTHER): Payer: 59 | Admitting: Internal Medicine

## 2015-02-08 VITALS — BP 110/80 | HR 67 | Temp 97.5°F | Resp 18 | Ht 67.5 in

## 2015-02-08 DIAGNOSIS — Z87312 Personal history of (healed) stress fracture: Secondary | ICD-10-CM

## 2015-02-08 DIAGNOSIS — E7800 Pure hypercholesterolemia, unspecified: Secondary | ICD-10-CM

## 2015-02-08 DIAGNOSIS — Z658 Other specified problems related to psychosocial circumstances: Secondary | ICD-10-CM

## 2015-02-08 DIAGNOSIS — R946 Abnormal results of thyroid function studies: Secondary | ICD-10-CM | POA: Diagnosis not present

## 2015-02-08 DIAGNOSIS — E2839 Other primary ovarian failure: Secondary | ICD-10-CM | POA: Diagnosis not present

## 2015-02-08 DIAGNOSIS — M79672 Pain in left foot: Secondary | ICD-10-CM

## 2015-02-08 DIAGNOSIS — F439 Reaction to severe stress, unspecified: Secondary | ICD-10-CM

## 2015-02-08 DIAGNOSIS — R7989 Other specified abnormal findings of blood chemistry: Secondary | ICD-10-CM

## 2015-02-08 DIAGNOSIS — R109 Unspecified abdominal pain: Secondary | ICD-10-CM

## 2015-02-08 DIAGNOSIS — E0590 Thyrotoxicosis, unspecified without thyrotoxic crisis or storm: Secondary | ICD-10-CM

## 2015-02-08 HISTORY — DX: Pure hypercholesterolemia, unspecified: E78.00

## 2015-02-08 LAB — COMPREHENSIVE METABOLIC PANEL
ALBUMIN: 4.9 g/dL (ref 3.5–5.2)
ALT: 22 U/L (ref 0–35)
AST: 26 U/L (ref 0–37)
Alkaline Phosphatase: 95 U/L (ref 39–117)
BILIRUBIN TOTAL: 0.7 mg/dL (ref 0.2–1.2)
BUN: 22 mg/dL (ref 6–23)
CALCIUM: 10 mg/dL (ref 8.4–10.5)
CO2: 33 mEq/L — ABNORMAL HIGH (ref 19–32)
CREATININE: 0.96 mg/dL (ref 0.40–1.20)
Chloride: 100 mEq/L (ref 96–112)
GFR: 63.65 mL/min (ref >60.00)
Glucose, Bld: 90 mg/dL (ref 70–99)
Potassium: 4.6 mEq/L (ref 3.5–5.1)
Sodium: 138 mEq/L (ref 135–145)
TOTAL PROTEIN: 7 g/dL (ref 6.0–8.3)

## 2015-02-08 LAB — LIPID PANEL
CHOLESTEROL: 335 mg/dL — AB (ref 0–200)
HDL: 78.5 mg/dL (ref >39.00)
LDL Cholesterol: 229 mg/dL — ABNORMAL HIGH (ref 0–99)
NonHDL: 256.86
TRIGLYCERIDES: 137 mg/dL (ref 0.0–149.0)
Total CHOL/HDL Ratio: 4
VLDL: 27.4 mg/dL (ref 0.0–40.0)

## 2015-02-08 LAB — TSH: TSH: 4.91 u[IU]/mL — ABNORMAL HIGH (ref 0.35–4.50)

## 2015-02-08 NOTE — Progress Notes (Signed)
Pre-visit discussion using our clinic review tool. No additional management support is needed unless otherwise documented below in the visit note.  

## 2015-02-08 NOTE — Progress Notes (Signed)
Patient ID: Amber Richardson, female   DOB: December 10, 1955, 60 y.o.   MRN: [id number]   Subjective:    Patient ID: Amber Richardson, female    DOB: 10/20/1955, 60 y.o.   MRN: [id number]  HPI  Patient with past history of hyperthyroidism, GERD and allergies.  Is being followed by Dr Vickki Muff for left foot fracture.  In a boot.  Non weight bearing.  Not exercising.  Gaining weight.  Discussed her cholesterol.  Discussed diet and exercise.  She is unable to exercise.  We discussed obtaining a bone density.  She has noticed being more irritable.  She would like her thyroid checked.  No abdominal pain or cramping.  Bowels stable.  No acid reflux.     Past Medical History  Diagnosis Date  . Pyelonephritis 1985  . Hyperthyroidism   . GERD (gastroesophageal reflux disease)   . Allergy   . Basal cell carcinoma    Past Surgical History  Procedure Laterality Date  . Tonsillectomy  1963  . Miscarriage      x1  . Dilation and curettage of uterus     Family History  Problem Relation Age of Onset  . Hypertension Mother   . Heart disease Father   . Hyperlipidemia Father   . Hypercholesterolemia Father   . Hypercholesterolemia Mother    Social History   Social History  . Marital Status: Married    Spouse Name: N/A  . Number of Children: 0  . Years of Education: N/A   Social History Main Topics  . Smoking status: Never Smoker   . Smokeless tobacco: Never Used  . Alcohol Use: No  . Drug Use: No  . Sexual Activity: Not Asked   Other Topics Concern  . None   Social History Narrative    Outpatient Encounter Prescriptions as of 02/08/2015  Medication Sig  . ALPRAZolam (XANAX) 0.25 MG tablet Take 0.25 mg by mouth 3 (three) times daily as needed for anxiety or sleep.   . Calcium Citrate-Vitamin D (CITRACAL + D PO) Take by mouth. Take one in the morning & 2 in the afternoon  . DULoxetine (CYMBALTA) 20 MG capsule Take 20 mg by mouth daily.  Marland Kitchen ibuprofen (ADVIL,MOTRIN) 600 MG tablet Take 600 mg  by mouth as needed for pain.  . Multiple Vitamin (MULTIVITAMIN) tablet Take 1 tablet by mouth daily.  . valACYclovir (VALTREX) 500 MG tablet Take one to two tablets by mouth daily   No facility-administered encounter medications on file as of 02/08/2015.    Review of Systems  Constitutional: Negative for appetite change.       Has gained weight.  Not as active.    HENT: Negative for congestion and sinus pressure.   Respiratory: Negative for cough, chest tightness and shortness of breath.   Cardiovascular: Negative for chest pain, palpitations and leg swelling.  Gastrointestinal: Negative for nausea, vomiting, abdominal pain and diarrhea.  Genitourinary: Negative for dysuria and difficulty urinating.  Musculoskeletal: Negative for back pain and joint swelling.  Skin: Negative for color change and rash.  Neurological: Negative for dizziness, light-headedness and headaches.  Psychiatric/Behavioral: Negative for dysphoric mood.       Does feel she is more irritable.         Objective:     Blood pressure rechecked by me:  122/78  Physical Exam  Constitutional: She appears well-developed and well-nourished. No distress.  HENT:  Nose: Nose normal.  Mouth/Throat: Oropharynx is clear and moist.  Eyes: Conjunctivae are normal. Right  eye exhibits no discharge. Left eye exhibits no discharge.  Neck: Neck supple. No thyromegaly present.  Cardiovascular: Normal rate and regular rhythm.   Pulmonary/Chest: Breath sounds normal. No respiratory distress. She has no wheezes.  Abdominal: Soft. Bowel sounds are normal. There is no tenderness.  Musculoskeletal: She exhibits no edema or tenderness.  Lymphadenopathy:    She has no cervical adenopathy.  Skin: No rash noted. No erythema.  Psychiatric: She has a normal mood and affect. Her behavior is normal.    BP 110/80 mmHg  Pulse 67  Temp(Src) 97.5 F (36.4 C) (Oral)  Resp 18  Ht 5' 7.5" (1.715 m)  Wt   SpO2 95%  LMP 01/13/2008 Wt  Readings from Last 3 Encounters:  10/27/14 158 lb 12 oz (72.009 kg)  08/29/14 154 lb 4 oz (69.967 kg)  07/19/12 152 lb 4 oz (69.06 kg)     Lab Results  Component Value Date   WBC 5.4 08/29/2014   HGB 13.9 08/29/2014   HCT 42.1 08/29/2014   PLT 212.0 08/29/2014   GLUCOSE 90 02/08/2015   CHOL 335* 02/08/2015   TRIG 137.0 02/08/2015   HDL 78.50 02/08/2015   LDLDIRECT 165.6 03/23/2012   LDLCALC 229* 02/08/2015   ALT 22 02/08/2015   AST 26 02/08/2015   NA 138 02/08/2015   K 4.6 02/08/2015   CL 100 02/08/2015   CREATININE 0.96 02/08/2015   BUN 22 02/08/2015   CO2 33* 02/08/2015   TSH 4.91* 02/08/2015    Mr Ankle Left  Wo Contrast  11/22/2014  CLINICAL DATA:  Left ankle and foot pain and swelling since August, 2016. Left ankle weakness. No known injury. Subsequent encounter. EXAM: MRI OF THE LEFT ANKLE WITHOUT CONTRAST TECHNIQUE: Multiplanar, multisequence MR imaging of the ankle was performed. No intravenous contrast was administered. COMPARISON:  Plain films of the left foot and ankle 10/27/2014. FINDINGS: TENDONS Peroneal: Intact. Posteromedial: Intact. Anterior: Intact. Achilles: Intact. Plantar Fascia: Appears normal. LIGAMENTS Lateral: The patient has a chronic appearing tear of the anterior talofibular ligament. The ligament is markedly attenuated without surrounding soft tissue edema. There appears to be a thin band of intact fibers present. Lateral ligaments are otherwise unremarkable. Medial: Intact. CARTILAGE Ankle Joint: Unremarkable. Subtalar Joints/Sinus Tarsi: Unremarkable. Bones: Marked marrow edema is seen in the head and neck of the talus. Sagittal T1 weighted images demonstrate a subtle hypointense line through the neck of the talus most compatible with a nondisplaced fracture. IMPRESSION: Findings most consistent with an acute or subacute nondisplaced and incomplete stress fracture of the talar neck with associated marrow edema. Markedly attenuated anterior talofibular  ligament is compatible with chronic sprain and partial tear. Electronically Signed   By: Inge Rise M.D.   On: 11/22/2014 11:56       Assessment & Plan:   Problem List Items Addressed This Visit    Abdominal pain   Relevant Orders   Urinalysis, Routine w reflex microscopic (not at [facility])   CULTURE, URINE COMPREHENSIVE (Completed)   Hypercholesterolemia    Last cholesterol checked - elevated.  Recheck cholesterol level today.  Low cholesterol diet and exercise.        Relevant Orders   Lipid panel (Completed)   Comprehensive metabolic panel (Completed)   Hyperthyroidism    Off tapazole.  Last tsh slightly elevated.  Recheck tsh.        Left foot pain    S/p fracture.  Followed by podiatry.  In a boot.  Non weight bearing.  Stress    Followed by psychiatry.  Feels more irritible.  On cymbalta.  Check thyroid.         Other Visit Diagnoses    Estrogen deficiency    -  Primary    Relevant Orders    DG Bone Density    History of stress fracture        Relevant Orders    DG Bone Density    Elevated TSH        Relevant Orders    TSH (Completed)        Einar Pheasant, MD

## 2015-02-09 ENCOUNTER — Encounter: Payer: Self-pay | Admitting: Internal Medicine

## 2015-02-10 LAB — CULTURE, URINE COMPREHENSIVE
Colony Count: NO GROWTH
Organism ID, Bacteria: NO GROWTH

## 2015-02-10 MED ORDER — ROSUVASTATIN CALCIUM 10 MG PO TABS
10.0000 mg | ORAL_TABLET | Freq: Every day | ORAL | Status: DC
Start: 2015-02-10 — End: 2015-04-03

## 2015-02-10 NOTE — Telephone Encounter (Signed)
rx sent in for crestor.  

## 2015-02-11 ENCOUNTER — Encounter: Payer: Self-pay | Admitting: Internal Medicine

## 2015-02-11 NOTE — Assessment & Plan Note (Signed)
S/p fracture.  Followed by podiatry.  In a boot.  Non weight bearing.

## 2015-02-11 NOTE — Assessment & Plan Note (Signed)
Followed by psychiatry.  Feels more irritible.  On cymbalta.  Check thyroid.

## 2015-02-11 NOTE — Assessment & Plan Note (Signed)
Last cholesterol checked - elevated.  Recheck cholesterol level today.  Low cholesterol diet and exercise.

## 2015-02-11 NOTE — Assessment & Plan Note (Signed)
Off tapazole.  Last tsh slightly elevated.  Recheck tsh.

## 2015-03-02 ENCOUNTER — Other Ambulatory Visit: Payer: Self-pay | Admitting: Podiatry

## 2015-03-02 DIAGNOSIS — S92115G Nondisplaced fracture of neck of left talus, subsequent encounter for fracture with delayed healing: Secondary | ICD-10-CM

## 2015-03-05 ENCOUNTER — Ambulatory Visit
Admission: RE | Admit: 2015-03-05 | Discharge: 2015-03-05 | Disposition: A | Discharge status: Home / Self Care | Payer: 59 | Source: Ambulatory Visit | Attending: Podiatry | Admitting: Podiatry

## 2015-03-05 DIAGNOSIS — S92115S Nondisplaced fracture of neck of left talus, sequela: Secondary | ICD-10-CM | POA: Insufficient documentation

## 2015-03-05 DIAGNOSIS — M2548 Effusion, other site: Secondary | ICD-10-CM | POA: Diagnosis not present

## 2015-03-05 DIAGNOSIS — M24272 Disorder of ligament, left ankle: Secondary | ICD-10-CM | POA: Diagnosis not present

## 2015-03-05 DIAGNOSIS — S92115G Nondisplaced fracture of neck of left talus, subsequent encounter for fracture with delayed healing: Secondary | ICD-10-CM | POA: Diagnosis present

## 2015-03-27 ENCOUNTER — Other Ambulatory Visit: Payer: 59

## 2015-03-29 ENCOUNTER — Telehealth: Payer: Self-pay | Admitting: *Deleted

## 2015-03-29 ENCOUNTER — Other Ambulatory Visit (INDEPENDENT_AMBULATORY_CARE_PROVIDER_SITE_OTHER): Payer: 59

## 2015-03-29 DIAGNOSIS — E7800 Pure hypercholesterolemia, unspecified: Secondary | ICD-10-CM | POA: Diagnosis not present

## 2015-03-29 DIAGNOSIS — R946 Abnormal results of thyroid function studies: Secondary | ICD-10-CM

## 2015-03-29 DIAGNOSIS — R7989 Other specified abnormal findings of blood chemistry: Secondary | ICD-10-CM

## 2015-03-29 LAB — HEPATIC FUNCTION PANEL
ALT: 21 U/L (ref 0–35)
AST: 26 U/L (ref 0–37)
Albumin: 4.4 g/dL (ref 3.5–5.2)
Alkaline Phosphatase: 85 U/L (ref 39–117)
BILIRUBIN DIRECT: 0.1 mg/dL (ref 0.0–0.3)
BILIRUBIN TOTAL: 0.7 mg/dL (ref 0.2–1.2)
TOTAL PROTEIN: 6.3 g/dL (ref 6.0–8.3)

## 2015-03-29 LAB — TSH: TSH: 2.9 u[IU]/mL (ref 0.35–4.50)

## 2015-03-29 NOTE — Telephone Encounter (Signed)
Labs and dx?  

## 2015-03-29 NOTE — Telephone Encounter (Signed)
Order placed for labs.

## 2015-03-30 ENCOUNTER — Encounter: Payer: Self-pay | Admitting: Internal Medicine

## 2015-04-02 ENCOUNTER — Encounter: Payer: Self-pay | Admitting: Internal Medicine

## 2015-04-02 DIAGNOSIS — M79672 Pain in left foot: Secondary | ICD-10-CM

## 2015-04-02 DIAGNOSIS — M25572 Pain in left ankle and joints of left foot: Secondary | ICD-10-CM

## 2015-04-03 ENCOUNTER — Other Ambulatory Visit: Payer: Self-pay | Admitting: Surgical

## 2015-04-03 MED ORDER — ROSUVASTATIN CALCIUM 10 MG PO TABS: 10.0000 mg | ORAL_TABLET | Freq: Every day | ORAL | Status: DC

## 2015-04-04 NOTE — Telephone Encounter (Signed)
Unread mychart message mailed to patient 

## 2015-04-06 NOTE — Telephone Encounter (Signed)
Order placed for ortho referral.   

## 2015-04-06 NOTE — Addendum Note (Signed)
Addended by: Alisa Graff on: 04/06/2015 04:19 AM   Modules accepted: Orders

## 2015-05-08 ENCOUNTER — Encounter: Payer: Self-pay | Admitting: Internal Medicine

## 2015-05-08 ENCOUNTER — Ambulatory Visit (INDEPENDENT_AMBULATORY_CARE_PROVIDER_SITE_OTHER): Payer: 59 | Admitting: Internal Medicine

## 2015-05-08 VITALS — BP 110/70 | HR 55 | Temp 97.6°F | Resp 18 | Ht 67.5 in | Wt 157.4 lb

## 2015-05-08 DIAGNOSIS — M810 Age-related osteoporosis without current pathological fracture: Secondary | ICD-10-CM

## 2015-05-08 DIAGNOSIS — M79672 Pain in left foot: Secondary | ICD-10-CM

## 2015-05-08 DIAGNOSIS — Z658 Other specified problems related to psychosocial circumstances: Secondary | ICD-10-CM

## 2015-05-08 DIAGNOSIS — E7800 Pure hypercholesterolemia, unspecified: Secondary | ICD-10-CM | POA: Diagnosis not present

## 2015-05-08 DIAGNOSIS — F439 Reaction to severe stress, unspecified: Secondary | ICD-10-CM

## 2015-05-08 MED ORDER — ATORVASTATIN CALCIUM 10 MG PO TABS
10.0000 mg | ORAL_TABLET | Freq: Every day | ORAL | Status: DC
Start: 2015-05-08 — End: 2015-09-10

## 2015-05-08 MED ORDER — ALENDRONATE SODIUM 70 MG PO TABS: 70.0000 mg | ORAL_TABLET | ORAL | Status: DC

## 2015-05-08 NOTE — Progress Notes (Signed)
Patient ID: Amber Richardson, female   DOB: [birth year], 60 y.o.   MRN: [id number]   Subjective:    Patient ID: Amber Richardson, female    DOB: [birth year], 60 y.o.   MRN: [id number]  HPI  Patient here for a scheduled follow up.  She has been seeing podiatry for stress fracture.  Also found to have a Morton's neuroma.   S/p injection.  Helped.  Still with some pain.  Request referral to physical therapy for evaluation and treatment.  Also had recent bone density.  Reviewed results.  Discussed osteoporosis.  Discussed treatment options.  Discussed possible side effects of the medication. Also had aching with crestor.  The aching stopped when she stopped the crestor.  Discussed her cholesterol levels and other treatment.  She agreed to try lipitor.  No chest pain or tightness.  No sob.  No acid reflux.  No abdominal pain or cramping.  Bowels stable.     Past Medical History  Diagnosis Date  . Pyelonephritis 1985  . Hyperthyroidism   . GERD (gastroesophageal reflux disease)   . Allergy   . Basal cell carcinoma    Past Surgical History  Procedure Laterality Date  . Tonsillectomy  1963  . Miscarriage      x1  . Dilation and curettage of uterus     Family History  Problem Relation Age of Onset  . Hypertension Mother   . Heart disease Father   . Hyperlipidemia Father   . Hypercholesterolemia Father   . Hypercholesterolemia Mother    Social History   Social History  . Marital Status: Married    Spouse Name: N/A  . Number of Children: 0  . Years of Education: N/A   Social History Main Topics  . Smoking status: Never Smoker   . Smokeless tobacco: Never Used  . Alcohol Use: No  . Drug Use: No  . Sexual Activity: Not Asked   Other Topics Concern  . None   Social History Narrative    Outpatient Encounter Prescriptions as of 05/08/2015  Medication Sig  . ALPRAZolam (XANAX) 0.25 MG tablet Take 0.25 mg by mouth 3 (three) times daily as needed for anxiety or sleep.   . Calcium  Citrate-Vitamin D (CITRACAL + D PO) Take by mouth. Take one in the morning & 2 in the afternoon  . DULoxetine HCl 40 MG CPEP TAKE 1 ORAL CAPSULE DAILY  . ibuprofen (ADVIL,MOTRIN) 600 MG tablet Take 600 mg by mouth as needed for pain.  . Multiple Vitamin (MULTIVITAMIN) tablet Take 1 tablet by mouth daily.  . valACYclovir (VALTREX) 500 MG tablet Take one to two tablets by mouth daily  . [DISCONTINUED] DULoxetine (CYMBALTA) 20 MG capsule Take 20 mg by mouth daily.  Marland Kitchen alendronate (FOSAMAX) 70 MG tablet Take 1 tablet (70 mg total) by mouth every 7 (seven) days. Take with a full glass of water on an empty stomach.  Marland Kitchen atorvastatin (LIPITOR) 10 MG tablet Take 1 tablet (10 mg total) by mouth daily.  . [DISCONTINUED] rosuvastatin (CRESTOR) 10 MG tablet Take 1 tablet (10 mg total) by mouth daily. (Patient not taking: Reported on 05/08/2015)   No facility-administered encounter medications on file as of 05/08/2015.    Review of Systems  Constitutional: Negative for appetite change and unexpected weight change.  HENT: Negative for congestion and sinus pressure.   Respiratory: Negative for cough, chest tightness and shortness of breath.   Cardiovascular: Negative for chest pain, palpitations and leg swelling.  Gastrointestinal: Negative for nausea,  vomiting, abdominal pain and diarrhea.  Genitourinary: Negative for dysuria and difficulty urinating.  Musculoskeletal: Negative for back pain.       Persistent foot pain.    Skin: Negative for color change and rash.  Neurological: Negative for dizziness, light-headedness and headaches.  Psychiatric/Behavioral: Negative for dysphoric mood and agitation.       Objective:    Physical Exam  Constitutional: She appears well-developed and well-nourished. No distress.  HENT:  Nose: Nose normal.  Mouth/Throat: Oropharynx is clear and moist.  Neck: Neck supple. No thyromegaly present.  Cardiovascular: Normal rate and regular rhythm.   Pulmonary/Chest: Breath  sounds normal. No respiratory distress. She has no wheezes.  Abdominal: Soft. Bowel sounds are normal. There is no tenderness.  Musculoskeletal: She exhibits no edema or tenderness.  Lymphadenopathy:    She has no cervical adenopathy.  Skin: No rash noted. No erythema.  Psychiatric: She has a normal mood and affect. Her behavior is normal.    BP 110/70 mmHg  Pulse 55  Temp(Src) 97.6 F (36.4 C) (Oral)  Resp 18  Ht 5' 7.5" (1.715 m)  Wt 157 lb 6 oz (71.385 kg)  BMI 24.27 kg/m2  SpO2 97%  LMP 01/13/2008 Wt Readings from Last 3 Encounters:  05/08/15 157 lb 6 oz (71.385 kg)  10/27/14 158 lb 12 oz (72.009 kg)  08/29/14 154 lb 4 oz (69.967 kg)     Lab Results  Component Value Date   WBC 5.4 08/29/2014   HGB 13.9 08/29/2014   HCT 42.1 08/29/2014   PLT 212.0 08/29/2014   GLUCOSE 90 02/08/2015   CHOL 335* 02/08/2015   TRIG 137.0 02/08/2015   HDL 78.50 02/08/2015   LDLDIRECT 165.6 03/23/2012   LDLCALC 229* 02/08/2015   ALT 21 03/29/2015   AST 26 03/29/2015   NA 138 02/08/2015   K 4.6 02/08/2015   CL 100 02/08/2015   CREATININE 0.96 02/08/2015   BUN 22 02/08/2015   CO2 33* 02/08/2015   TSH 2.90 03/29/2015    Mr Ankle Left  Wo Contrast  03/05/2015  CLINICAL DATA:  Persistent ankle pain for 6 months. The patient was diagnosed with a stress fracture of the distal talus in October 2016. EXAM: MRI OF THE LEFT ANKLE WITHOUT CONTRAST TECHNIQUE: Multiplanar, multisequence MR imaging of the ankle was performed. No intravenous contrast was administered. COMPARISON:  MRI dated 11/22/2014 FINDINGS: TENDONS Peroneal: Normal. Posteromedial: Minimal fluid in the tendon sheath of the posterior tibialis tendon, essentially unchanged. Anterior: Normal. Achilles: Slight inflammation around the distal Achilles tendon with a tiny amount of fluid in the retrocalcaneal bursa. This is slightly more prominent than on the prior exam. Plantar Fascia: Normal. LIGAMENTS Lateral: Again noted is a very  diminutive anterior talofibular ligament, unchanged. Posterior talofibular ligament and calcaneofibular ligaments are intact. Medial: Normal. CARTILAGE Ankle Joint: Small ankle effusion, unchanged. Subtalar Joints/Sinus Tarsi: Small subtalar joint effusion, unchanged. Bones: The stress fracture in the distal talus has completely healed. There is some minimal subcortical edema of the head of the talus at the talonavicular joint. IMPRESSION: 1. Complete healing of the stress fracture of the distal talus. 2. Small nonspecific ankle and subtalar joint effusions. 3. Slight inflammation around the distal Achilles tendon. 4. Chronic sprain/partial tear of the anterior talofibular ligament, unchanged. In Electronically Signed   By: Lorriane Shire M.D.   On: 03/05/2015 10:00       Assessment & Plan:   Problem List Items Addressed This Visit    Hypercholesterolemia  Had aching with crestor.  Resolved with stopping the medication.  Start lipitor.  Pt in agreement.        Relevant Medications   atorvastatin (LIPITOR) 10 MG tablet   Other Relevant Orders   Lipid panel   Hepatic function panel   Basic metabolic panel   Left foot pain    S/p fracture.  Has been followed by podiatry.  Has seen ortho.  Refer to physical therapy.        Relevant Orders   Ambulatory referral to Physical Therapy   Osteoporosis    Discussed bone density results and treatment options.  She was in agreement to start fosamax.  Discussed proper way to take medications and possible side effects of medication.        Relevant Medications   alendronate (FOSAMAX) 70 MG tablet   Other Relevant Orders   VITAMIN D 25 Hydroxy (Vit-D Deficiency, Fractures)   Stress - Primary    Followed by psychiatry.  On cymbalta.  Increased stress.  Does not feel needs any further intervention at this time.  Follow.           Einar Pheasant, MD

## 2015-05-08 NOTE — Progress Notes (Signed)
Pre-visit discussion using our clinic review tool. No additional management support is needed unless otherwise documented below in the visit note.  

## 2015-05-13 ENCOUNTER — Encounter: Payer: Self-pay | Admitting: Internal Medicine

## 2015-05-13 DIAGNOSIS — M810 Age-related osteoporosis without current pathological fracture: Secondary | ICD-10-CM | POA: Insufficient documentation

## 2015-05-13 HISTORY — DX: Age-related osteoporosis without current pathological fracture: M81.0

## 2015-05-13 NOTE — Assessment & Plan Note (Signed)
S/p fracture.  Has been followed by podiatry.  Has seen ortho.  Refer to physical therapy.

## 2015-05-13 NOTE — Assessment & Plan Note (Addendum)
Discussed bone density results and treatment options.  She was in agreement to start fosamax.  Discussed proper way to take medications and possible side effects of medication.

## 2015-05-13 NOTE — Assessment & Plan Note (Signed)
Had aching with crestor.  Resolved with stopping the medication.  Start lipitor.  Pt in agreement.

## 2015-05-13 NOTE — Assessment & Plan Note (Signed)
Followed by psychiatry.  On cymbalta.  Increased stress.  Does not feel needs any further intervention at this time.  Follow.

## 2015-06-13 ENCOUNTER — Encounter: Payer: Self-pay | Admitting: Internal Medicine

## 2015-06-13 ENCOUNTER — Ambulatory Visit (INDEPENDENT_AMBULATORY_CARE_PROVIDER_SITE_OTHER): Payer: 59 | Admitting: Internal Medicine

## 2015-06-13 VITALS — BP 98/60 | HR 55 | Temp 97.5°F | Resp 18

## 2015-06-13 DIAGNOSIS — Z658 Other specified problems related to psychosocial circumstances: Secondary | ICD-10-CM

## 2015-06-13 DIAGNOSIS — R208 Other disturbances of skin sensation: Secondary | ICD-10-CM

## 2015-06-13 DIAGNOSIS — E7800 Pure hypercholesterolemia, unspecified: Secondary | ICD-10-CM

## 2015-06-13 DIAGNOSIS — F439 Reaction to severe stress, unspecified: Secondary | ICD-10-CM

## 2015-06-13 DIAGNOSIS — R200 Anesthesia of skin: Secondary | ICD-10-CM

## 2015-06-13 NOTE — Progress Notes (Signed)
Pre visit review using our clinic review tool, if applicable. No additional management support is needed unless otherwise documented below in the visit note. 

## 2015-06-13 NOTE — Progress Notes (Signed)
Patient ID: Amber Richardson, female   DOB: 1955-06-09, 60 y.o.   MRN: [id number]   Subjective:    Patient ID: Amber Richardson, female    DOB: 1955/03/01, 60 y.o.   MRN: [id number]  HPI  Patient here as a work in with concerns regarding left side head - numbness and tingling.  She report that she had been standing and cooking a lot on Mother's Day.  Noticed the numbness and tingling started acutely.  Has continued since.  Only noticed one time yesterday, but has been intermittent today.  The episodes today are short episodes.  No pain.  Previous very mild headache.  No severe headache. No vision change.  States her head does not feel the same.  No facial drooping.  No other neurological symptoms.  Does report increased stress and some tension in her neck.  Increased stress with her job and her family.     Past Medical History  Diagnosis Date  . Pyelonephritis 1985  . Hyperthyroidism   . GERD (gastroesophageal reflux disease)   . Allergy   . Basal cell carcinoma    Past Surgical History  Procedure Laterality Date  . Tonsillectomy  1963  . Miscarriage      x1  . Dilation and curettage of uterus     Family History  Problem Relation Age of Onset  . Hypertension Mother   . Heart disease Father   . Hyperlipidemia Father   . Hypercholesterolemia Father   . Hypercholesterolemia Mother    Social History   Social History  . Marital Status: Married    Spouse Name: N/A  . Number of Children: 0  . Years of Education: N/A   Social History Main Topics  . Smoking status: Never Smoker   . Smokeless tobacco: Never Used  . Alcohol Use: No  . Drug Use: No  . Sexual Activity: Not Asked   Other Topics Concern  . None   Social History Narrative    Review of Systems  Constitutional: Negative for appetite change and unexpected weight change.  HENT: Negative for congestion and sinus pressure.   Eyes: Negative for visual disturbance.  Respiratory: Negative for chest tightness and  shortness of breath.   Cardiovascular: Negative for chest pain and leg swelling.  Gastrointestinal: Negative for nausea and vomiting.  Skin: Negative for color change and rash.  Neurological: Positive for numbness (facial - left side. ) and headaches (only minimal headache. ). Negative for dizziness and tremors.  Psychiatric/Behavioral: Negative for dysphoric mood.       Increased stress.        Objective:    Physical Exam  Constitutional: She appears well-developed and well-nourished. No distress.  HENT:  Nose: Nose normal.  Mouth/Throat: Oropharynx is clear and moist.  Eyes: EOM are normal.  Neck: Neck supple.  Cardiovascular: Normal rate and regular rhythm.   Pulmonary/Chest: Breath sounds normal. No respiratory distress. She has no wheezes.  Abdominal: Soft. Bowel sounds are normal. There is no tenderness.  Musculoskeletal: She exhibits no edema or tenderness.  Lymphadenopathy:    She has no cervical adenopathy.  Neurological:  CNs appear to be intact.  Sensation appears to be equal on both sides of face.    Skin: No rash noted. No erythema.  Psychiatric: She has a normal mood and affect. Her behavior is normal.    BP 98/60 mmHg  Pulse 55  Temp(Src) 97.5 F (36.4 C) (Oral)  Resp 18  SpO2 97%  LMP 01/13/2008 Wt Readings from  Last 3 Encounters:  05/08/15 157 lb 6 oz (71.385 kg)  10/27/14 158 lb 12 oz (72.009 kg)  08/29/14 154 lb 4 oz (69.967 kg)     Lab Results  Component Value Date   WBC 5.4 08/29/2014   HGB 13.9 08/29/2014   HCT 42.1 08/29/2014   PLT 212.0 08/29/2014   GLUCOSE 90 02/08/2015   CHOL 335* 02/08/2015   TRIG 137.0 02/08/2015   HDL 78.50 02/08/2015   LDLDIRECT 165.6 03/23/2012   LDLCALC 229* 02/08/2015   ALT 21 03/29/2015   AST 26 03/29/2015   NA 138 02/08/2015   K 4.6 02/08/2015   CL 100 02/08/2015   CREATININE 0.96 02/08/2015   BUN 22 02/08/2015   CO2 33* 02/08/2015   TSH 2.90 03/29/2015       Assessment & Plan:   Problem List  Items Addressed This Visit    Hypercholesterolemia    Had intolerance to crestor.  On lipitor now.  Follow.       Left facial numbness - Primary    Acute onset of left facial numbness.  Intermittent episodes as outlined.  No significant pain.  No significant headache.  Some different sensation in her head.  No dizziness or visual disturbance.  No rash.  No facial drooping.  Unclear etiology.  Is persistent.  Discussed trigeminal neuralgia.  Discussed further w/up.  Will obtain MRI brain.  Start baby aspirin.  Will hold on any further testing.  Rest.  Treat muscle tension - neck with conservative measures.  No neck pain on exam.  Good rom.  Follow.        Relevant Orders   MR Brain W Wo Contrast   Stress    Increased stress.  Discussed with her today.  Does not feel needs any further intervention.  Follow.          I spent 25 minutes with the patient and more than 50% of the time was spent in consultation regarding the above.     Einar Pheasant, MD

## 2015-06-14 DIAGNOSIS — R200 Anesthesia of skin: Secondary | ICD-10-CM | POA: Insufficient documentation

## 2015-06-14 NOTE — Assessment & Plan Note (Signed)
Acute onset of left facial numbness.  Intermittent episodes as outlined.  No significant pain.  No significant headache.  Some different sensation in her head.  No dizziness or visual disturbance.  No rash.  No facial drooping.  Unclear etiology.  Is persistent.  Discussed trigeminal neuralgia.  Discussed further w/up.  Will obtain MRI brain.  Start baby aspirin.  Will hold on any further testing.  Rest.  Treat muscle tension - neck with conservative measures.  No neck pain on exam.  Good rom.  Follow.

## 2015-06-14 NOTE — Assessment & Plan Note (Signed)
Increased stress.  Discussed with her today.  Does not feel needs any further intervention.  Follow.   

## 2015-06-14 NOTE — Assessment & Plan Note (Signed)
Had intolerance to crestor.  On lipitor now.  Follow.

## 2015-06-19 ENCOUNTER — Other Ambulatory Visit (INDEPENDENT_AMBULATORY_CARE_PROVIDER_SITE_OTHER): Payer: 59

## 2015-06-19 DIAGNOSIS — E7800 Pure hypercholesterolemia, unspecified: Secondary | ICD-10-CM | POA: Diagnosis not present

## 2015-06-19 DIAGNOSIS — M810 Age-related osteoporosis without current pathological fracture: Secondary | ICD-10-CM

## 2015-06-19 LAB — LIPID PANEL
CHOLESTEROL: 196 mg/dL (ref 0–200)
HDL: 66.9 mg/dL (ref >39.00)
LDL Cholesterol: 112 mg/dL — ABNORMAL HIGH (ref 0–99)
NonHDL: 128.75
Total CHOL/HDL Ratio: 3
Triglycerides: 84 mg/dL (ref 0.0–149.0)
VLDL: 16.8 mg/dL (ref 0.0–40.0)

## 2015-06-19 LAB — BASIC METABOLIC PANEL
BUN: 19 mg/dL (ref 6–23)
CALCIUM: 9.5 mg/dL (ref 8.4–10.5)
CO2: 31 mEq/L (ref 19–32)
Chloride: 103 mEq/L (ref 96–112)
Creatinine, Ser: 0.88 mg/dL (ref 0.40–1.20)
GFR: 70.29 mL/min (ref >60.00)
GLUCOSE: 83 mg/dL (ref 70–99)
Potassium: 4.3 mEq/L (ref 3.5–5.1)
Sodium: 138 mEq/L (ref 135–145)

## 2015-06-19 LAB — HEPATIC FUNCTION PANEL
ALT: 30 U/L (ref 0–35)
AST: 29 U/L (ref 0–37)
Albumin: 4.6 g/dL (ref 3.5–5.2)
Alkaline Phosphatase: 81 U/L (ref 39–117)
BILIRUBIN DIRECT: 0.1 mg/dL (ref 0.0–0.3)
BILIRUBIN TOTAL: 0.6 mg/dL (ref 0.2–1.2)
Total Protein: 6.8 g/dL (ref 6.0–8.3)

## 2015-06-19 LAB — VITAMIN D 25 HYDROXY (VIT D DEFICIENCY, FRACTURES): VITD: 33.47 ng/mL (ref 30.00–100.00)

## 2015-06-20 ENCOUNTER — Encounter: Payer: Self-pay | Admitting: Internal Medicine

## 2015-06-26 ENCOUNTER — Other Ambulatory Visit: Payer: Self-pay | Admitting: Internal Medicine

## 2015-06-26 NOTE — Telephone Encounter (Signed)
Error

## 2015-06-28 NOTE — Telephone Encounter (Signed)
Unread mychart message mailed to patient 

## 2015-06-29 ENCOUNTER — Other Ambulatory Visit: Payer: Self-pay | Admitting: *Deleted

## 2015-06-29 MED ORDER — ALENDRONATE SODIUM 70 MG PO TABS: 70.0000 mg | ORAL_TABLET | ORAL | Status: DC

## 2015-07-04 ENCOUNTER — Ambulatory Visit: Payer: 59

## 2015-07-11 ENCOUNTER — Ambulatory Visit
Admission: RE | Admit: 2015-07-11 | Discharge: 2015-07-11 | Disposition: A | Discharge status: Home / Self Care | Payer: 59 | Source: Ambulatory Visit | Attending: Internal Medicine | Admitting: Internal Medicine

## 2015-07-11 DIAGNOSIS — R200 Anesthesia of skin: Secondary | ICD-10-CM

## 2015-07-11 DIAGNOSIS — R208 Other disturbances of skin sensation: Secondary | ICD-10-CM | POA: Diagnosis present

## 2015-07-11 MED ORDER — GADOBENATE DIMEGLUMINE 529 MG/ML IV SOLN
15.0000 mL | Freq: Once | INTRAVENOUS | Status: AC | PRN
  Administered 2015-07-11: 14 mL via INTRAVENOUS

## 2015-07-12 ENCOUNTER — Encounter: Payer: Self-pay | Admitting: Internal Medicine

## 2015-07-24 NOTE — Telephone Encounter (Signed)
Unread mychart message mailed to patient 

## 2015-09-10 ENCOUNTER — Encounter: Payer: Self-pay | Admitting: Internal Medicine

## 2015-09-10 ENCOUNTER — Ambulatory Visit (INDEPENDENT_AMBULATORY_CARE_PROVIDER_SITE_OTHER): Payer: 59 | Admitting: Internal Medicine

## 2015-09-10 DIAGNOSIS — E0590 Thyrotoxicosis, unspecified without thyrotoxic crisis or storm: Secondary | ICD-10-CM

## 2015-09-10 DIAGNOSIS — F439 Reaction to severe stress, unspecified: Secondary | ICD-10-CM

## 2015-09-10 DIAGNOSIS — R208 Other disturbances of skin sensation: Secondary | ICD-10-CM | POA: Diagnosis not present

## 2015-09-10 DIAGNOSIS — Z658 Other specified problems related to psychosocial circumstances: Secondary | ICD-10-CM

## 2015-09-10 DIAGNOSIS — E7800 Pure hypercholesterolemia, unspecified: Secondary | ICD-10-CM | POA: Diagnosis not present

## 2015-09-10 DIAGNOSIS — M79672 Pain in left foot: Secondary | ICD-10-CM | POA: Diagnosis not present

## 2015-09-10 DIAGNOSIS — R200 Anesthesia of skin: Secondary | ICD-10-CM

## 2015-09-10 DIAGNOSIS — M810 Age-related osteoporosis without current pathological fracture: Secondary | ICD-10-CM

## 2015-09-10 NOTE — Progress Notes (Signed)
Pre-visit discussion using our clinic review tool. No additional management support is needed unless otherwise documented below in the visit note.  

## 2015-09-10 NOTE — Progress Notes (Signed)
Patient ID: Amber Richardson, female   DOB: 1955/04/23, 60 y.o.   MRN: [id number]   Subjective:    Patient ID: Amber Richardson, female    DOB: 1955-10-17, 60 y.o.   MRN: [id number]  HPI  Patient here for a scheduled follow up.  She is accompanied by her husband.  History obtained from both of them.  She has been under increased stress recently.  Her husband recently had heart cath.  S/p stent placement.  Doing well.  Persistent foot pain.  Has been seeing ortho.  Is planning to have arthroscopic surgery 10/26/15.  Seeing Dr Diona Browner.  Has been to PT.  Persistent pain.  Taking fosamax.  No chest pain.  No sob.  Tolerating her cholesterol medication.  Last LDL has overall improved.  No abdominal pain or cramping.  Bowels stable.     Past Medical History:  Diagnosis Date  . Allergy   . Basal cell carcinoma   . GERD (gastroesophageal reflux disease)   . Hyperthyroidism   . Pyelonephritis 1985   Past Surgical History:  Procedure Laterality Date  . DILATION AND CURETTAGE OF UTERUS    . Miscarriage     x1  . TONSILLECTOMY  1963   Family History  Problem Relation Age of Onset  . Hypertension Mother   . Hypercholesterolemia Mother   . Heart disease Father   . Hyperlipidemia Father   . Hypercholesterolemia Father    Social History   Social History  . Marital status: Married    Spouse name: N/A  . Number of children: 0  . Years of education: N/A   Social History Main Topics  . Smoking status: Never Smoker  . Smokeless tobacco: Never Used  . Alcohol use No  . Drug use: No  . Sexual activity: Not Asked   Other Topics Concern  . None   Social History Narrative  . None    Outpatient Encounter Prescriptions as of 09/10/2015  Medication Sig  . alendronate (FOSAMAX) 70 MG tablet Take 1 tablet (70 mg total) by mouth every 7 (seven) days. Take with a full glass of water on an empty stomach.  . ALPRAZolam (XANAX) 0.25 MG tablet Take 0.25 mg by mouth 3 (three) times daily as needed for  anxiety or sleep.   Marland Kitchen atorvastatin (LIPITOR) 10 MG tablet Take 1 tablet (10 mg total) by mouth daily.  . Calcium Citrate-Vitamin D (CITRACAL + D PO) Take by mouth. Take one in the morning & 2 in the afternoon  . DULoxetine HCl 40 MG CPEP TAKE 1 ORAL CAPSULE DAILY  . ibuprofen (ADVIL,MOTRIN) 600 MG tablet Take 600 mg by mouth as needed for pain.  . Multiple Vitamin (MULTIVITAMIN) tablet Take 1 tablet by mouth daily.  . valACYclovir (VALTREX) 500 MG tablet Take one to two tablets by mouth daily  . [DISCONTINUED] atorvastatin (LIPITOR) 10 MG tablet Take 1 tablet (10 mg total) by mouth daily.   No facility-administered encounter medications on file as of 09/10/2015.     Review of Systems  Constitutional: Negative for appetite change and unexpected weight change.  HENT: Negative for congestion and sinus pressure.   Respiratory: Negative for cough, chest tightness and shortness of breath.   Cardiovascular: Negative for chest pain, palpitations and leg swelling.  Gastrointestinal: Negative for abdominal pain, diarrhea, nausea and vomiting.  Genitourinary: Negative for difficulty urinating and dysuria.  Musculoskeletal: Negative for myalgias.       Persistent left foot pain.   Skin: Negative for color change  and rash.  Neurological: Negative for dizziness, light-headedness and headaches.  Psychiatric/Behavioral: Negative for agitation and dysphoric mood.       Increased stress as outlined.        Objective:     Blood pressure rechecked by me:  134/80  Physical Exam  Constitutional: She appears well-developed and well-nourished. No distress.  HENT:  Nose: Nose normal.  Mouth/Throat: Oropharynx is clear and moist.  Neck: Neck supple. No thyromegaly present.  Cardiovascular: Normal rate and regular rhythm.   Pulmonary/Chest: Breath sounds normal. No respiratory distress. She has no wheezes.  Abdominal: Soft. Bowel sounds are normal. There is no tenderness.  Musculoskeletal: She exhibits  no edema or tenderness.  Lymphadenopathy:    She has no cervical adenopathy.  Skin: No rash noted. No erythema.  Psychiatric: She has a normal mood and affect. Her behavior is normal.    BP 110/80   Pulse (!) 59   Temp 97.9 F (36.6 C) (Oral)   Resp 17   Ht 5' 7.5" (1.715 m)   Wt 157 lb 4 oz (71.3 kg)   LMP 01/13/2008   SpO2 98%   BMI 24.27 kg/m  Wt Readings from Last 3 Encounters:  09/10/15 157 lb 4 oz (71.3 kg)  05/08/15 157 lb 6 oz (71.4 kg)  10/27/14 158 lb 12 oz (72 kg)     Lab Results  Component Value Date   WBC 5.4 08/29/2014   HGB 13.9 08/29/2014   HCT 42.1 08/29/2014   PLT 212.0 08/29/2014   GLUCOSE 83 06/19/2015   CHOL 196 06/19/2015   TRIG 84.0 06/19/2015   HDL 66.90 06/19/2015   LDLDIRECT 165.6 03/23/2012   LDLCALC 112 (H) 06/19/2015   ALT 30 06/19/2015   AST 29 06/19/2015   NA 138 06/19/2015   K 4.3 06/19/2015   CL 103 06/19/2015   CREATININE 0.88 06/19/2015   BUN 19 06/19/2015   CO2 31 06/19/2015   TSH 2.90 03/29/2015    Mr Brain W Wo Contrast  Result Date: 07/11/2015 CLINICAL DATA:  Left facial numbness EXAM: MRI HEAD WITHOUT AND WITH CONTRAST TECHNIQUE: Multiplanar, multiecho pulse sequences of the brain and surrounding structures were obtained without and with intravenous contrast. CONTRAST:  13mL MULTIHANCE GADOBENATE DIMEGLUMINE 529 MG/ML IV SOLN COMPARISON:  None. FINDINGS: Ventricle size normal.  Cerebral volume normal. Negative for acute or chronic infarction Negative for demyelinating disease. Cerebral white matter normal. Brainstem and cerebellum normal. Negative for hemorrhage or fluid collection. Negative for mass or edema. Cavernous sinus is normal. Skull base normal. Pituitary not enlarged. Postcontrast imaging demonstrates normal enhancement. No enhancing mass lesion. Normal vascular enhancement. Paranasal sinuses clear.  Normal orbit. IMPRESSION: Negative Electronically Signed   By: Franchot Gallo M.D.   On: 07/11/2015 09:49         Assessment & Plan:   Problem List Items Addressed This Visit    Hypercholesterolemia    Tolerating lipitor.  Cholesterol has improved.  Low cholesterol diet and exercise.  Follow lipid panel and liver function tests.        Relevant Medications   atorvastatin (LIPITOR) 10 MG tablet   Other Relevant Orders   Lipid panel   Hepatic function panel   Basic metabolic panel   Hyperthyroidism    Off tapazole.  Follow tsh.       Relevant Orders   CBC with Differential/Platelet   TSH   Left facial numbness    Had MRI.  Not an issue for her now.  Follow.        Left foot pain    Persistent.  Has been to physical therapy.  Pain persistent.  Planning for arthroscopic surgery.  Continue f/u with ortho.       Osteoporosis    On fosamax.  Follow.       Stress    Followed by psychiatry.  Increased stress recently with her husband's health issues.  Follow.  Discussed with her today.  Continue f/u with psych.        Other Visit Diagnoses   None.      Einar Pheasant, MD

## 2015-09-18 ENCOUNTER — Encounter: Payer: Self-pay | Admitting: Internal Medicine

## 2015-09-18 MED ORDER — ATORVASTATIN CALCIUM 10 MG PO TABS: 10.0000 mg | ORAL_TABLET | Freq: Every day | ORAL | 1 refills | Status: DC

## 2015-09-18 NOTE — Assessment & Plan Note (Addendum)
Followed by psychiatry.  Increased stress recently with her husband's health issues.  Follow.  Discussed with her today.  Continue f/u with psych.

## 2015-09-18 NOTE — Assessment & Plan Note (Signed)
Had MRI.  Not an issue for her now.  Follow.

## 2015-09-18 NOTE — Assessment & Plan Note (Signed)
Tolerating lipitor.  Cholesterol has improved.  Low cholesterol diet and exercise.  Follow lipid panel and liver function tests.

## 2015-09-18 NOTE — Assessment & Plan Note (Signed)
Off tapazole.  Follow tsh.

## 2015-09-18 NOTE — Assessment & Plan Note (Signed)
On fosamax.  Follow  

## 2015-09-18 NOTE — Assessment & Plan Note (Signed)
Persistent.  Has been to physical therapy.  Pain persistent.  Planning for arthroscopic surgery.  Continue f/u with ortho.

## 2015-10-17 ENCOUNTER — Other Ambulatory Visit: Payer: 59

## 2015-10-18 ENCOUNTER — Other Ambulatory Visit (INDEPENDENT_AMBULATORY_CARE_PROVIDER_SITE_OTHER): Payer: 59

## 2015-10-18 DIAGNOSIS — E7800 Pure hypercholesterolemia, unspecified: Secondary | ICD-10-CM

## 2015-10-18 DIAGNOSIS — E0590 Thyrotoxicosis, unspecified without thyrotoxic crisis or storm: Secondary | ICD-10-CM | POA: Diagnosis not present

## 2015-10-18 LAB — BASIC METABOLIC PANEL
BUN: 18 mg/dL (ref 6–23)
CALCIUM: 9.1 mg/dL (ref 8.4–10.5)
CO2: 32 mEq/L (ref 19–32)
Chloride: 105 mEq/L (ref 96–112)
Creatinine, Ser: 0.94 mg/dL (ref 0.40–1.20)
GFR: 65.06 mL/min (ref >60.00)
Glucose, Bld: 89 mg/dL (ref 70–99)
POTASSIUM: 4.5 meq/L (ref 3.5–5.1)
SODIUM: 141 meq/L (ref 135–145)

## 2015-10-18 LAB — LIPID PANEL
CHOLESTEROL: 211 mg/dL — AB (ref 0–200)
HDL: 82.1 mg/dL (ref >39.00)
LDL Cholesterol: 118 mg/dL — ABNORMAL HIGH (ref 0–99)
NonHDL: 128.87
Total CHOL/HDL Ratio: 3
Triglycerides: 56 mg/dL (ref 0.0–149.0)
VLDL: 11.2 mg/dL (ref 0.0–40.0)

## 2015-10-18 LAB — HEPATIC FUNCTION PANEL
ALBUMIN: 4.3 g/dL (ref 3.5–5.2)
ALT: 31 U/L (ref 0–35)
AST: 26 U/L (ref 0–37)
Alkaline Phosphatase: 73 U/L (ref 39–117)
BILIRUBIN TOTAL: 0.6 mg/dL (ref 0.2–1.2)
Bilirubin, Direct: 0 mg/dL (ref 0.0–0.3)
Total Protein: 7.1 g/dL (ref 6.0–8.3)

## 2015-10-18 LAB — CBC WITH DIFFERENTIAL/PLATELET
BASOS ABS: 0 10*3/uL (ref 0.0–0.1)
Basophils Relative: 0.4 % (ref 0.0–3.0)
EOS ABS: 0 10*3/uL (ref 0.0–0.7)
Eosinophils Relative: 0.7 % (ref 0.0–5.0)
HCT: 39.9 % (ref 36.0–46.0)
HEMOGLOBIN: 13.4 g/dL (ref 12.0–15.0)
Lymphocytes Relative: 33.6 % (ref 12.0–46.0)
Lymphs Abs: 2 10*3/uL (ref 0.7–4.0)
MCHC: 33.6 g/dL (ref 30.0–36.0)
MCV: 96 fl (ref 78.0–100.0)
MONO ABS: 0.5 10*3/uL (ref 0.1–1.0)
Monocytes Relative: 8.3 % (ref 3.0–12.0)
Neutro Abs: 3.4 10*3/uL (ref 1.4–7.7)
Neutrophils Relative %: 57 % (ref 43.0–77.0)
Platelets: 237 10*3/uL (ref 150.0–400.0)
RBC: 4.16 Mil/uL (ref 3.87–5.11)
RDW: 14 % (ref 11.5–15.5)
WBC: 5.9 10*3/uL (ref 4.0–10.5)

## 2015-10-18 LAB — TSH: TSH: 7.27 u[IU]/mL — AB (ref 0.35–4.50)

## 2015-10-19 ENCOUNTER — Other Ambulatory Visit: Payer: Self-pay | Admitting: Internal Medicine

## 2015-10-19 DIAGNOSIS — R7989 Other specified abnormal findings of blood chemistry: Secondary | ICD-10-CM

## 2015-10-21 ENCOUNTER — Encounter: Payer: Self-pay | Admitting: Internal Medicine

## 2015-10-22 ENCOUNTER — Telehealth: Payer: Self-pay | Admitting: Internal Medicine

## 2015-10-22 ENCOUNTER — Telehealth: Payer: Self-pay | Admitting: *Deleted

## 2015-10-22 NOTE — Telephone Encounter (Signed)
Pt given results see results note for information.

## 2015-10-22 NOTE — Telephone Encounter (Signed)
Pt called returning your call regarding labs and a question about medication dosage. Thank you!  Call pt @ [phone]

## 2015-10-22 NOTE — Telephone Encounter (Signed)
See lab result note.   TSH is addressed in note.  Just need to recheck.  See note.   Please inform pt.  Thanks

## 2015-10-22 NOTE — Telephone Encounter (Signed)
Patient requested labs result Pt contact [phone]

## 2015-11-29 ENCOUNTER — Other Ambulatory Visit (INDEPENDENT_AMBULATORY_CARE_PROVIDER_SITE_OTHER): Payer: 59

## 2015-11-29 DIAGNOSIS — R7989 Other specified abnormal findings of blood chemistry: Secondary | ICD-10-CM

## 2015-11-29 DIAGNOSIS — R946 Abnormal results of thyroid function studies: Secondary | ICD-10-CM

## 2015-11-29 LAB — TSH: TSH: 7.95 u[IU]/mL — AB (ref 0.35–4.50)

## 2015-12-03 ENCOUNTER — Other Ambulatory Visit: Payer: Self-pay | Admitting: Internal Medicine

## 2015-12-03 ENCOUNTER — Telehealth: Payer: Self-pay

## 2015-12-03 DIAGNOSIS — E039 Hypothyroidism, unspecified: Secondary | ICD-10-CM

## 2015-12-03 MED ORDER — LEVOTHYROXINE SODIUM 50 MCG PO TABS: 50.0000 ug | ORAL_TABLET | Freq: Every day | ORAL | 0 refills | Status: DC

## 2015-12-03 NOTE — Telephone Encounter (Signed)
FYI pt uses the [facility] in [place]

## 2015-12-03 NOTE — Progress Notes (Signed)
Order placed for f/u tsh.  

## 2015-12-03 NOTE — Telephone Encounter (Signed)
-----   Message from Einar Pheasant, MD sent at 12/03/2015  5:38 AM EST ----- Notify pt that her tsh is still slightly elevated.  Given persistent elevation, I would like to start her on synthroid 35mcg q day.  Will need tsh checked in 6 weeks.  Will need to send in rx for synthroid and schedule lab appt.

## 2016-01-10 ENCOUNTER — Other Ambulatory Visit (INDEPENDENT_AMBULATORY_CARE_PROVIDER_SITE_OTHER): Payer: 59

## 2016-01-10 DIAGNOSIS — E039 Hypothyroidism, unspecified: Secondary | ICD-10-CM | POA: Diagnosis not present

## 2016-01-10 LAB — TSH: TSH: 4.03 u[IU]/mL (ref 0.35–4.50)

## 2016-01-11 ENCOUNTER — Encounter: Payer: Self-pay | Admitting: Internal Medicine

## 2016-01-14 ENCOUNTER — Ambulatory Visit (INDEPENDENT_AMBULATORY_CARE_PROVIDER_SITE_OTHER): Payer: 59 | Admitting: Internal Medicine

## 2016-01-14 ENCOUNTER — Encounter: Payer: Self-pay | Admitting: Internal Medicine

## 2016-01-14 DIAGNOSIS — M810 Age-related osteoporosis without current pathological fracture: Secondary | ICD-10-CM

## 2016-01-14 DIAGNOSIS — E7800 Pure hypercholesterolemia, unspecified: Secondary | ICD-10-CM

## 2016-01-14 DIAGNOSIS — E0590 Thyrotoxicosis, unspecified without thyrotoxic crisis or storm: Secondary | ICD-10-CM | POA: Diagnosis not present

## 2016-01-14 DIAGNOSIS — F439 Reaction to severe stress, unspecified: Secondary | ICD-10-CM | POA: Diagnosis not present

## 2016-01-14 NOTE — Progress Notes (Signed)
Pre visit review using our clinic review tool, if applicable. No additional management support is needed unless otherwise documented below in the visit note. 

## 2016-01-14 NOTE — Progress Notes (Signed)
Patient ID: Natelie Felder, female   DOB: 03-30-1955, 60 y.o.   MRN: [id number]   Subjective:    Patient ID: [name], female    DOB: 10/29/1955, 60 y.o.   MRN: [id number]  HPI  Patient here for a scheduled follow up.  She is accompanied by her husband.  History obtained from both of them.  She has been having persistent problems with her ankle.  Saw ortho (Dr Daniel Nones).  S/p injection.  She has also been having problems with her legs aching.  She feels this is aggravated by lipitor.  Off lipitor since 12/24/15.  Being off has helped.  Discussed remaining off and seeing if symptoms completely resolve.  She feels the ankle is better since being off.  She is concerned that being off the cholesterol medication and her thyroid issues are contributing to her change in mood.  States she is more irritable.  States gets angry easier.  Discussed that her thyroid function test is now wnl.  Discussed her increased stress.  She is seeing psychiatry.  Discussed her talking with her psychiatrist about her feelings, etc.  She is in agreement.  She saw gyn 12/26/15.  No chest pain.  Tries to stay active.  No sob.  No abdominal pain or cramping.  Bowels stable.    Past Medical History:  Diagnosis Date  . Allergy   . Basal cell carcinoma   . GERD (gastroesophageal reflux disease)   . Hyperthyroidism   . Pyelonephritis 1985   Past Surgical History:  Procedure Laterality Date  . DILATION AND CURETTAGE OF UTERUS    . Miscarriage     x1  . TONSILLECTOMY  1963   Family History  Problem Relation Age of Onset  . Hypertension Mother   . Hypercholesterolemia Mother   . Heart disease Father   . Hyperlipidemia Father   . Hypercholesterolemia Father    Social History   Social History  . Marital status: Married    Spouse name: N/A  . Number of children: 0  . Years of education: N/A   Social History Main Topics  . Smoking status: Never Smoker  . Smokeless tobacco: Never Used  . Alcohol use  No  . Drug use: No  . Sexual activity: Not Asked   Other Topics Concern  . None   Social History Narrative  . None    Outpatient Encounter Prescriptions as of 01/14/2016  Medication Sig  . alendronate (FOSAMAX) 70 MG tablet Take 1 tablet (70 mg total) by mouth every 7 (seven) days. Take with a full glass of water on an empty stomach.  . ALPRAZolam (XANAX) 0.25 MG tablet Take 0.25 mg by mouth 3 (three) times daily as needed for anxiety or sleep.   . Calcium Citrate-Vitamin D (CITRACAL + D PO) Take by mouth. Take one in the morning & 2 in the afternoon  . DULoxetine HCl 40 MG CPEP TAKE 1 ORAL CAPSULE DAILY  . ibuprofen (ADVIL,MOTRIN) 600 MG tablet Take 600 mg by mouth as needed for pain.  Marland Kitchen levothyroxine (SYNTHROID, LEVOTHROID) 50 MCG tablet Take 1 tablet (50 mcg total) by mouth daily.  . Multiple Vitamin (MULTIVITAMIN) tablet Take 1 tablet by mouth daily.  . valACYclovir (VALTREX) 500 MG tablet Take one to two tablets by mouth daily  . atorvastatin (LIPITOR) 10 MG tablet Take 1 tablet (10 mg total) by mouth daily. (Patient not taking: Reported on 01/14/2016)   No facility-administered encounter medications on file as of 01/14/2016.  Review of Systems  Constitutional: Negative for appetite change and unexpected weight change.  HENT: Negative for congestion and sinus pressure.   Respiratory: Negative for cough, chest tightness and shortness of breath.   Cardiovascular: Negative for chest pain, palpitations and leg swelling.  Gastrointestinal: Negative for abdominal pain, diarrhea, nausea and vomiting.  Genitourinary: Negative for difficulty urinating and dysuria.  Musculoskeletal:       Leg aching better since stopping lipitor.  Ankle pain has improved.    Skin: Negative for color change and rash.  Neurological: Negative for dizziness, light-headedness and headaches.  Psychiatric/Behavioral:       Increased stress.  Gets angry easier.         Objective:    Physical Exam    Constitutional: She appears well-developed and well-nourished. No distress.  HENT:  Nose: Nose normal.  Mouth/Throat: Oropharynx is clear and moist.  Neck: Neck supple. No thyromegaly present.  Cardiovascular: Normal rate and regular rhythm.   Pulmonary/Chest: Breath sounds normal. No respiratory distress. She has no wheezes.  Abdominal: Soft. Bowel sounds are normal. There is no tenderness.  Musculoskeletal: She exhibits no edema or tenderness.  Lymphadenopathy:    She has no cervical adenopathy.  Skin: No rash noted. No erythema.  Psychiatric: She has a normal mood and affect. Her behavior is normal.    BP 108/64   Pulse 67   Temp 97.4 F (36.3 C) (Oral)   Wt 157 lb 9.6 oz (71.5 kg)   LMP 01/13/2008   SpO2 96%   BMI 24.32 kg/m  Wt Readings from Last 3 Encounters:  01/14/16 157 lb 9.6 oz (71.5 kg)  09/10/15 157 lb 4 oz (71.3 kg)  05/08/15 157 lb 6 oz (71.4 kg)     Lab Results  Component Value Date   WBC 5.9 10/18/2015   HGB 13.4 10/18/2015   HCT 39.9 10/18/2015   PLT 237.0 10/18/2015   GLUCOSE 89 10/18/2015   CHOL 211 (H) 10/18/2015   TRIG 56.0 10/18/2015   HDL 82.10 10/18/2015   LDLDIRECT 165.6 03/23/2012   LDLCALC 118 (H) 10/18/2015   ALT 31 10/18/2015   AST 26 10/18/2015   NA 141 10/18/2015   K 4.5 10/18/2015   CL 105 10/18/2015   CREATININE 0.94 10/18/2015   BUN 18 10/18/2015   CO2 32 10/18/2015   TSH 4.03 01/10/2016    Mr Brain W Wo Contrast  Result Date: 07/11/2015 CLINICAL DATA:  Left facial numbness EXAM: MRI HEAD WITHOUT AND WITH CONTRAST TECHNIQUE: Multiplanar, multiecho pulse sequences of the brain and surrounding structures were obtained without and with intravenous contrast. CONTRAST:  35mL MULTIHANCE GADOBENATE DIMEGLUMINE 529 MG/ML IV SOLN COMPARISON:  None. FINDINGS: Ventricle size normal.  Cerebral volume normal. Negative for acute or chronic infarction Negative for demyelinating disease. Cerebral white matter normal. Brainstem and  cerebellum normal. Negative for hemorrhage or fluid collection. Negative for mass or edema. Cavernous sinus is normal. Skull base normal. Pituitary not enlarged. Postcontrast imaging demonstrates normal enhancement. No enhancing mass lesion. Normal vascular enhancement. Paranasal sinuses clear.  Normal orbit. IMPRESSION: Negative Electronically Signed   By: Franchot Gallo M.D.   On: 07/11/2015 09:49       Assessment & Plan:   Problem List Items Addressed This Visit    Hypercholesterolemia    Off lipitor.  Will remain off for now.  Call with update over the next two weeks.  Aching better.  May consider starting pravastatin.        Relevant Orders  Lipid panel   Hepatic function panel   Basic metabolic panel   Hyperthyroidism    Off tapazole.  Recent elevated tsh.  On levothyroxine.  tsh wnl now.  Follow.        Relevant Orders   TSH   Osteoporosis    On fosamax.  Continue.        Stress    Increased stress.  Discussed with her today.  Discussed normal thyroid level.  She sees psychiatry.  Plans to f/u with them to discuss her current concerns.            Einar Pheasant, MD

## 2016-01-24 ENCOUNTER — Encounter: Payer: Self-pay | Admitting: Internal Medicine

## 2016-01-24 NOTE — Assessment & Plan Note (Signed)
Off tapazole.  Recent elevated tsh.  On levothyroxine.  tsh wnl now.  Follow.

## 2016-01-24 NOTE — Assessment & Plan Note (Signed)
Off lipitor.  Will remain off for now.  Call with update over the next two weeks.  Aching better.  May consider starting pravastatin.

## 2016-01-24 NOTE — Assessment & Plan Note (Signed)
Increased stress.  Discussed with her today.  Discussed normal thyroid level.  She sees psychiatry.  Plans to f/u with them to discuss her current concerns.

## 2016-01-24 NOTE — Assessment & Plan Note (Signed)
On fosamax.  Continue.

## 2016-02-15 ENCOUNTER — Encounter: Payer: Self-pay | Admitting: Internal Medicine

## 2016-02-15 NOTE — Telephone Encounter (Signed)
If she is having chest pain and some sob (even though she relates it to the mammogram), I would like for her to go ahead and be evaluated to confirm nothing more acute going on.

## 2016-02-15 NOTE — Telephone Encounter (Signed)
Pain started after the mammogram on 01/10/16, but the pain comes and goes with Exerction, if lying around or just sitting she does not notice any pain. Patient stated at the time of mammogram it was sore at her sternum but this has changed, now just pain in sternum and and in left breast. Patient stated that a couple of times going up stairs she has become SOB. Patient has had no pain today but is going into work this afternoon has just been lying around today.

## 2016-02-15 NOTE — Telephone Encounter (Signed)
Patient stated she will go to Urgent care [facility].

## 2016-03-20 ENCOUNTER — Other Ambulatory Visit (INDEPENDENT_AMBULATORY_CARE_PROVIDER_SITE_OTHER): Payer: 59

## 2016-03-20 DIAGNOSIS — E7800 Pure hypercholesterolemia, unspecified: Secondary | ICD-10-CM

## 2016-03-20 DIAGNOSIS — E0590 Thyrotoxicosis, unspecified without thyrotoxic crisis or storm: Secondary | ICD-10-CM

## 2016-03-20 LAB — HEPATIC FUNCTION PANEL
ALT: 26 U/L (ref 0–35)
AST: 27 U/L (ref 0–37)
Albumin: 4.4 g/dL (ref 3.5–5.2)
Alkaline Phosphatase: 75 U/L (ref 39–117)
BILIRUBIN TOTAL: 0.5 mg/dL (ref 0.2–1.2)
Bilirubin, Direct: 0.1 mg/dL (ref 0.0–0.3)
Total Protein: 6.3 g/dL (ref 6.0–8.3)

## 2016-03-20 LAB — BASIC METABOLIC PANEL
BUN: 21 mg/dL (ref 6–23)
CALCIUM: 9.2 mg/dL (ref 8.4–10.5)
CHLORIDE: 104 meq/L (ref 96–112)
CO2: 32 mEq/L (ref 19–32)
CREATININE: 0.85 mg/dL (ref 0.40–1.20)
GFR: 72.97 mL/min (ref >60.00)
Glucose, Bld: 90 mg/dL (ref 70–99)
Potassium: 4.4 mEq/L (ref 3.5–5.1)
Sodium: 139 mEq/L (ref 135–145)

## 2016-03-20 LAB — LIPID PANEL
CHOL/HDL RATIO: 3
CHOLESTEROL: 256 mg/dL — AB (ref 0–200)
HDL: 76.2 mg/dL (ref >39.00)
LDL CALC: 164 mg/dL — AB (ref 0–99)
NONHDL: 180.11
Triglycerides: 82 mg/dL (ref 0.0–149.0)
VLDL: 16.4 mg/dL (ref 0.0–40.0)

## 2016-03-20 LAB — TSH: TSH: 3.88 u[IU]/mL (ref 0.35–4.50)

## 2016-03-21 ENCOUNTER — Encounter: Payer: Self-pay | Admitting: Internal Medicine

## 2016-03-25 ENCOUNTER — Encounter: Payer: Self-pay | Admitting: Internal Medicine

## 2016-03-25 ENCOUNTER — Ambulatory Visit (INDEPENDENT_AMBULATORY_CARE_PROVIDER_SITE_OTHER): Payer: 59 | Admitting: Internal Medicine

## 2016-03-25 VITALS — BP 110/68 | HR 60 | Temp 98.5°F | Resp 16 | Ht 68.0 in | Wt 154.6 lb

## 2016-03-25 DIAGNOSIS — E0590 Thyrotoxicosis, unspecified without thyrotoxic crisis or storm: Secondary | ICD-10-CM | POA: Diagnosis not present

## 2016-03-25 DIAGNOSIS — F439 Reaction to severe stress, unspecified: Secondary | ICD-10-CM

## 2016-03-25 DIAGNOSIS — R079 Chest pain, unspecified: Secondary | ICD-10-CM

## 2016-03-25 DIAGNOSIS — R0781 Pleurodynia: Secondary | ICD-10-CM | POA: Diagnosis not present

## 2016-03-25 DIAGNOSIS — R0789 Other chest pain: Secondary | ICD-10-CM

## 2016-03-25 DIAGNOSIS — E7800 Pure hypercholesterolemia, unspecified: Secondary | ICD-10-CM | POA: Diagnosis not present

## 2016-03-25 LAB — SEDIMENTATION RATE: SED RATE: 3 mm/h (ref 0–30)

## 2016-03-25 LAB — C-REACTIVE PROTEIN: CRP: 0.1 mg/dL — ABNORMAL LOW (ref 0.5–20.0)

## 2016-03-25 LAB — CK: Total CK: 141 U/L (ref 7–177)

## 2016-03-25 MED ORDER — LEVOTHYROXINE SODIUM 75 MCG PO TABS: 75.0000 ug | ORAL_TABLET | Freq: Every day | ORAL | 1 refills | Status: DC

## 2016-03-25 NOTE — Progress Notes (Signed)
Pre-visit discussion using our clinic review tool. No additional management support is needed unless otherwise documented below in the visit note.  

## 2016-03-25 NOTE — Progress Notes (Signed)
Patient ID: Amber Richardson, female   DOB: 01/06/1956, 60 y.o.   MRN: [id number]   Subjective:    Patient ID: Amber Richardson, female    DOB: 06/04/1955, 60 y.o.   MRN: [id number]  HPI  Patient here for a scheduled follow up.  She reports she feels exhausted.  She is sleeping.  States still feels tired throughout the day.  We discussed the possibility of sleep apnea.  States she does not believe she has sleep apnea.  She feels all of her symptoms are related to her thyroid.  We discussed that her thyroid was wnl.  It has been the upper limits of normal, but wnl.  I explained that I did not feel her symptoms were related to her thyroid.  We discussed her increased stress.  She is getting angry easier.  Increased stress at work.  She has recently switched locations.  Hopefully will be less stress.  Discussed the need to f/u with her psychiatrist and discuss the above concerns.  She was admitted recently with chest pain.  Notes reviewed.  Had negative cardiac w/up (negative stress test).  States still having rib pain since her mammogram.  Reproducible on exam.  Desires further w/up.  Had negative cxr.  No sob.  No acid reflux.  No abdominal pain.  Bowels moving.  Discussed cholesterol.  Discussed diet and exercise.  Had issues with statin medication.     Past Medical History:  Diagnosis Date  . Allergy   . Basal cell carcinoma   . GERD (gastroesophageal reflux disease)   . Hyperthyroidism   . Pyelonephritis 1985   Past Surgical History:  Procedure Laterality Date  . DILATION AND CURETTAGE OF UTERUS    . Miscarriage     x1  . TONSILLECTOMY  1963   Family History  Problem Relation Age of Onset  . Hypertension Mother   . Hypercholesterolemia Mother   . Heart disease Father   . Hyperlipidemia Father   . Hypercholesterolemia Father    Social History   Social History  . Marital status: Married    Spouse name: N/A  . Number of children: 0  . Years of education: N/A   Social History  Main Topics  . Smoking status: Never Smoker  . Smokeless tobacco: Never Used  . Alcohol use No  . Drug use: No  . Sexual activity: Not Asked   Other Topics Concern  . None   Social History Narrative  . None    Outpatient Encounter Prescriptions as of 03/25/2016  Medication Sig  . alendronate (FOSAMAX) 70 MG tablet Take 1 tablet (70 mg total) by mouth every 7 (seven) days. Take with a full glass of water on an empty stomach.  . ALPRAZolam (XANAX) 0.25 MG tablet Take 0.25 mg by mouth 3 (three) times daily as needed for anxiety or sleep.   . Calcium Citrate-Vitamin D (CITRACAL + D PO) Take by mouth. Take one in the morning & 2 in the afternoon  . DULoxetine HCl 40 MG CPEP TAKE 1 ORAL CAPSULE DAILY  . ibuprofen (ADVIL,MOTRIN) 600 MG tablet Take 600 mg by mouth as needed for pain.  . Multiple Vitamin (MULTIVITAMIN) tablet Take 1 tablet by mouth daily.  . valACYclovir (VALTREX) 500 MG tablet Take one to two tablets by mouth daily  . [DISCONTINUED] levothyroxine (SYNTHROID, LEVOTHROID) 50 MCG tablet Take 1 tablet (50 mcg total) by mouth daily.  . levothyroxine (SYNTHROID) 75 MCG tablet Take 1 tablet (75 mcg total) by mouth daily   before breakfast.  . [DISCONTINUED] atorvastatin (LIPITOR) 10 MG tablet Take 1 tablet (10 mg total) by mouth daily. (Patient not taking: Reported on 01/14/2016)   No facility-administered encounter medications on file as of 03/25/2016.     Review of Systems  Constitutional: Positive for fatigue. Negative for appetite change and unexpected weight change.  HENT: Negative for congestion and sinus pressure.   Respiratory: Negative for cough, chest tightness and shortness of breath.   Cardiovascular: Negative for palpitations and leg swelling.       Rib pain as outlined.    Gastrointestinal: Negative for abdominal pain, diarrhea, nausea and vomiting.  Genitourinary: Negative for difficulty urinating and dysuria.  Musculoskeletal: Negative for back pain and joint  swelling.  Skin: Negative for color change and rash.  Neurological: Negative for dizziness, light-headedness and headaches.  Psychiatric/Behavioral: Negative for agitation and dysphoric mood.       Objective:    Physical Exam  Constitutional: She appears well-developed and well-nourished. No distress.  HENT:  Nose: Nose normal.  Mouth/Throat: Oropharynx is clear and moist.  Neck: Neck supple. No thyromegaly present.  Cardiovascular: Normal rate and regular rhythm.   Pulmonary/Chest: Breath sounds normal. No respiratory distress. She has no wheezes.  Good breath sounds bilaterally.  No pain with deep breathing.   Abdominal: Soft. Bowel sounds are normal. There is no tenderness.  Musculoskeletal: She exhibits no edema or tenderness.  Lymphadenopathy:    She has no cervical adenopathy.  Skin: No rash noted. No erythema.  Psychiatric: She has a normal mood and affect. Her behavior is normal.    BP 110/68 (BP Location: Left Arm, Patient Position: Sitting, Cuff Size: Large)   Pulse 60   Temp 98.5 F (36.9 C) (Oral)   Resp 16   Ht 5' 8" (1.727 m)   Wt 154 lb 9.6 oz (70.1 kg)   LMP 01/13/2008   SpO2 99%   BMI 23.51 kg/m  Wt Readings from Last 3 Encounters:  03/25/16 154 lb 9.6 oz (70.1 kg)  01/14/16 157 lb 9.6 oz (71.5 kg)  09/10/15 157 lb 4 oz (71.3 kg)     Lab Results  Component Value Date   WBC 5.9 10/18/2015   HGB 13.4 10/18/2015   HCT 39.9 10/18/2015   PLT 237.0 10/18/2015   GLUCOSE 90 03/20/2016   CHOL 256 (H) 03/20/2016   TRIG 82.0 03/20/2016   HDL 76.20 03/20/2016   LDLDIRECT 165.6 03/23/2012   LDLCALC 164 (H) 03/20/2016   ALT 26 03/20/2016   AST 27 03/20/2016   NA 139 03/20/2016   K 4.4 03/20/2016   CL 104 03/20/2016   CREATININE 0.85 03/20/2016   BUN 21 03/20/2016   CO2 32 03/20/2016   TSH 3.88 03/20/2016    Mr Brain W Wo Contrast  Result Date: 07/11/2015 CLINICAL DATA:  Left facial numbness EXAM: MRI HEAD WITHOUT AND WITH CONTRAST TECHNIQUE:  Multiplanar, multiecho pulse sequences of the brain and surrounding structures were obtained without and with intravenous contrast. CONTRAST:  14mL MULTIHANCE GADOBENATE DIMEGLUMINE 529 MG/ML IV SOLN COMPARISON:  None. FINDINGS: Ventricle size normal.  Cerebral volume normal. Negative for acute or chronic infarction Negative for demyelinating disease. Cerebral white matter normal. Brainstem and cerebellum normal. Negative for hemorrhage or fluid collection. Negative for mass or edema. Cavernous sinus is normal. Skull base normal. Pituitary not enlarged. Postcontrast imaging demonstrates normal enhancement. No enhancing mass lesion. Normal vascular enhancement. Paranasal sinuses clear.  Normal orbit. IMPRESSION: Negative Electronically Signed   By: Charles    Clark M.D.   On: 07/11/2015 09:49       Assessment & Plan:   Problem List Items Addressed This Visit    Chest pain    She was admitted recently with chest pain.  Cardiac w/up unrevealing.  Had negative stress test.  Still with rib pain since her mammogram.  Check ck and esr.  Treat with tylenol and antiinflammatories.  Follow.  cxr ok.        Hypercholesterolemia    Off lipitor.  Had problems with crestor.  Low cholesterol diet and exercise.  Follow  Lipid panel and liver function tests.        Relevant Orders   TSH   Hyperthyroidism    Off tapazole.  Recent tsh elevated.  On thyroid medication now.  She feels a lot of her symptoms related to her thyroid.  Since tsh upper limits of normal, will increased synthroid to 75mcg q day.  Follow tsh.  If suppressed, will need to decrease the dose.  Follow closely.        Relevant Medications   levothyroxine (SYNTHROID) 75 MCG tablet   Stress    Discussed at length with her.  Discussed the need to see her psychiatrist and discuss.  Follow.         Other Visit Diagnoses    Rib pain    -  Primary   Relevant Orders   Sedimentation rate (Completed)   C-reactive protein (Completed)   CK  (Creatine Kinase) (Completed)   Anterior chest wall pain       Relevant Orders   Sedimentation rate (Completed)   C-reactive protein (Completed)   CK (Creatine Kinase) (Completed)       SCOTT, CHARLENE, MD 

## 2016-03-26 ENCOUNTER — Encounter: Payer: Self-pay | Admitting: Internal Medicine

## 2016-04-02 NOTE — Telephone Encounter (Signed)
Letter mailed

## 2016-04-06 ENCOUNTER — Encounter: Payer: Self-pay | Admitting: Internal Medicine

## 2016-04-06 NOTE — Assessment & Plan Note (Signed)
Off tapazole.  Recent tsh elevated.  On thyroid medication now.  She feels a lot of her symptoms related to her thyroid.  Since tsh upper limits of normal, will increased synthroid to 8mcg q day.  Follow tsh.  If suppressed, will need to decrease the dose.  Follow closely.

## 2016-04-06 NOTE — Assessment & Plan Note (Signed)
Off lipitor.  Had problems with crestor.  Low cholesterol diet and exercise.  Follow  Lipid panel and liver function tests.

## 2016-04-06 NOTE — Assessment & Plan Note (Signed)
She was admitted recently with chest pain.  Cardiac w/up unrevealing.  Had negative stress test.  Still with rib pain since her mammogram.  Check ck and esr.  Treat with tylenol and antiinflammatories.  Follow.  cxr ok.

## 2016-04-06 NOTE — Assessment & Plan Note (Signed)
Discussed at length with her.  Discussed the need to see her psychiatrist and discuss.  Follow.

## 2016-05-15 ENCOUNTER — Other Ambulatory Visit: Payer: 59

## 2016-05-15 ENCOUNTER — Other Ambulatory Visit (INDEPENDENT_AMBULATORY_CARE_PROVIDER_SITE_OTHER): Payer: 59

## 2016-05-15 DIAGNOSIS — E7800 Pure hypercholesterolemia, unspecified: Secondary | ICD-10-CM

## 2016-05-15 LAB — TSH: TSH: 0.22 u[IU]/mL — AB (ref 0.35–4.50)

## 2016-05-16 ENCOUNTER — Other Ambulatory Visit: Payer: Self-pay

## 2016-05-16 MED ORDER — LEVOTHYROXINE SODIUM 50 MCG PO TABS: 50.0000 ug | ORAL_TABLET | Freq: Every day | ORAL | 1 refills | Status: DC

## 2016-05-20 ENCOUNTER — Encounter: Payer: Self-pay | Admitting: Internal Medicine

## 2016-05-20 ENCOUNTER — Ambulatory Visit (INDEPENDENT_AMBULATORY_CARE_PROVIDER_SITE_OTHER): Payer: 59 | Admitting: Internal Medicine

## 2016-05-20 VITALS — BP 110/64 | HR 65 | Temp 97.5°F | Resp 12 | Ht 68.5 in | Wt 156.2 lb

## 2016-05-20 DIAGNOSIS — E7800 Pure hypercholesterolemia, unspecified: Secondary | ICD-10-CM | POA: Diagnosis not present

## 2016-05-20 DIAGNOSIS — F439 Reaction to severe stress, unspecified: Secondary | ICD-10-CM

## 2016-05-20 DIAGNOSIS — E0590 Thyrotoxicosis, unspecified without thyrotoxic crisis or storm: Secondary | ICD-10-CM | POA: Diagnosis not present

## 2016-05-20 DIAGNOSIS — R1030 Lower abdominal pain, unspecified: Secondary | ICD-10-CM | POA: Diagnosis not present

## 2016-05-20 MED ORDER — RISEDRONATE SODIUM 150 MG PO TABS: 150.0000 mg | ORAL_TABLET | ORAL | 3 refills | Status: DC

## 2016-05-20 NOTE — Progress Notes (Signed)
Patient ID: Amber Richardson, female   DOB: 1955-07-02, 61 y.o.   MRN: [id number]   Subjective:    Patient ID: Amber Richardson, female    DOB: September 04, 1955, 61 y.o.   MRN: [id number]  HPI  Patient here for a scheduled follow up.  She reports she is doing better.  Feels better.  Just adjusted her thyroid medication.  She is in a new job.  Stress is much better.  More time at home. Plans to start exercising more.  No chest pain.  No sob.  No acid reflux.  No abdominal pain.  Bowels moving.  Did have some lower abdominal discomfort yesterday.  Felt similar to way she feels when has uti.  No pain now.  No hematuria.  No dysuria.  No vaginal symptoms.     Past Medical History:  Diagnosis Date  . Allergy   . Basal cell carcinoma   . GERD (gastroesophageal reflux disease)   . Hyperthyroidism   . Pyelonephritis 1985   Past Surgical History:  Procedure Laterality Date  . DILATION AND CURETTAGE OF UTERUS    . Miscarriage     x1  . TONSILLECTOMY  1963   Family History  Problem Relation Age of Onset  . Hypertension Mother   . Hypercholesterolemia Mother   . Heart disease Father   . Hyperlipidemia Father   . Hypercholesterolemia Father    Social History   Social History  . Marital status: Married    Spouse name: N/A  . Number of children: 0  . Years of education: N/A   Social History Main Topics  . Smoking status: Never Smoker  . Smokeless tobacco: Never Used  . Alcohol use No  . Drug use: No  . Sexual activity: Not Asked   Other Topics Concern  . None   Social History Narrative  . None    Outpatient Encounter Prescriptions as of 05/20/2016  Medication Sig  . alendronate (FOSAMAX) 70 MG tablet Take 1 tablet (70 mg total) by mouth every 7 (seven) days. Take with a full glass of water on an empty stomach.  . ALPRAZolam (XANAX) 0.25 MG tablet Take 0.25 mg by mouth 3 (three) times daily as needed for anxiety or sleep.   . Calcium Citrate-Vitamin D (CITRACAL + D PO) Take by  mouth. Take one in the morning & 2 in the afternoon  . DULoxetine HCl 40 MG CPEP TAKE 1 ORAL CAPSULE DAILY  . ibuprofen (ADVIL,MOTRIN) 600 MG tablet Take 600 mg by mouth as needed for pain.  Marland Kitchen levothyroxine (SYNTHROID) 75 MCG tablet Take 1 tablet (75 mcg total) by mouth daily before breakfast.  . levothyroxine (SYNTHROID, LEVOTHROID) 50 MCG tablet Take 1 tablet (50 mcg total) by mouth daily.  . Multiple Vitamin (MULTIVITAMIN) tablet Take 1 tablet by mouth daily.  . valACYclovir (VALTREX) 500 MG tablet Take one to two tablets by mouth daily  . [DISCONTINUED] levothyroxine (SYNTHROID) 75 MCG tablet Take 1 tablet (75 mcg total) by mouth daily before breakfast.  . risedronate (ACTONEL) 150 MG tablet Take 1 tablet (150 mg total) by mouth every 30 (thirty) days. with water on empty stomach, nothing by mouth or lie down for next 30 minutes.   No facility-administered encounter medications on file as of 05/20/2016.     Review of Systems  Constitutional: Negative for appetite change and unexpected weight change.  HENT: Negative for congestion and sinus pressure.   Respiratory: Negative for cough, chest tightness and shortness of breath.   Cardiovascular:  Negative for chest pain, palpitations and leg swelling.  Gastrointestinal: Negative for diarrhea, nausea and vomiting.       Lower abdominal pain now.  Did occur yesterday.   Genitourinary: Negative for difficulty urinating and dysuria.  Musculoskeletal: Negative for back pain and joint swelling.  Skin: Negative for color change and rash.  Neurological: Negative for dizziness, light-headedness and headaches.  Psychiatric/Behavioral: Negative for agitation and dysphoric mood.       Objective:    Physical Exam  Constitutional: She appears well-developed and well-nourished. No distress.  HENT:  Nose: Nose normal.  Mouth/Throat: Oropharynx is clear and moist.  Neck: Neck supple. No thyromegaly present.  Cardiovascular: Normal rate and regular  rhythm.   Pulmonary/Chest: Breath sounds normal. No respiratory distress. She has no wheezes.  Abdominal: Soft. Bowel sounds are normal. There is no tenderness.  Musculoskeletal: She exhibits no edema or tenderness.  Lymphadenopathy:    She has no cervical adenopathy.  Skin: No rash noted. No erythema.  Psychiatric: She has a normal mood and affect. Her behavior is normal.    BP 110/64 (BP Location: Left Arm, Patient Position: Sitting, Cuff Size: Normal)   Pulse 65   Temp 97.5 F (36.4 C) (Oral)   Resp 12   Ht 5' 8.5" (1.74 m)   Wt 156 lb 3.2 oz (70.9 kg)   LMP 01/13/2008   SpO2 97%   BMI 23.40 kg/m  Wt Readings from Last 3 Encounters:  05/20/16 156 lb 3.2 oz (70.9 kg)  03/25/16 154 lb 9.6 oz (70.1 kg)  01/14/16 157 lb 9.6 oz (71.5 kg)     Lab Results  Component Value Date   WBC 5.9 10/18/2015   HGB 13.4 10/18/2015   HCT 39.9 10/18/2015   PLT 237.0 10/18/2015   GLUCOSE 90 03/20/2016   CHOL 256 (H) 03/20/2016   TRIG 82.0 03/20/2016   HDL 76.20 03/20/2016   LDLDIRECT 165.6 03/23/2012   LDLCALC 164 (H) 03/20/2016   ALT 26 03/20/2016   AST 27 03/20/2016   NA 139 03/20/2016   K 4.4 03/20/2016   CL 104 03/20/2016   CREATININE 0.85 03/20/2016   BUN 21 03/20/2016   CO2 32 03/20/2016   TSH 0.22 (L) 05/15/2016    Mr Brain W Wo Contrast  Result Date: 07/11/2015 CLINICAL DATA:  Left facial numbness EXAM: MRI HEAD WITHOUT AND WITH CONTRAST TECHNIQUE: Multiplanar, multiecho pulse sequences of the brain and surrounding structures were obtained without and with intravenous contrast. CONTRAST:  44mL MULTIHANCE GADOBENATE DIMEGLUMINE 529 MG/ML IV SOLN COMPARISON:  None. FINDINGS: Ventricle size normal.  Cerebral volume normal. Negative for acute or chronic infarction Negative for demyelinating disease. Cerebral white matter normal. Brainstem and cerebellum normal. Negative for hemorrhage or fluid collection. Negative for mass or edema. Cavernous sinus is normal. Skull base normal.  Pituitary not enlarged. Postcontrast imaging demonstrates normal enhancement. No enhancing mass lesion. Normal vascular enhancement. Paranasal sinuses clear.  Normal orbit. IMPRESSION: Negative Electronically Signed   By: Franchot Gallo M.D.   On: 07/11/2015 09:49       Assessment & Plan:   Problem List Items Addressed This Visit    Abdominal pain - Primary   Hypercholesterolemia    Low cholesterol diet and exercise.  Follow lipid panel.        Relevant Orders   Lipid panel   Hepatic function panel   Basic metabolic panel   Hyperthyroidism    Off tapazole.  Recently adjusted thyroid medication.  Follow tsh.  Relevant Medications   levothyroxine (SYNTHROID) 75 MCG tablet   Other Relevant Orders   TSH   Stress    Stress is better.  She is doing better.  Still seeing psychiatry.            Einar Pheasant, MD

## 2016-05-20 NOTE — Progress Notes (Signed)
Pre-visit discussion using our clinic review tool. No additional management support is needed unless otherwise documented below in the visit note.  

## 2016-05-24 ENCOUNTER — Other Ambulatory Visit: Payer: Self-pay | Admitting: Internal Medicine

## 2016-05-26 ENCOUNTER — Encounter: Payer: Self-pay | Admitting: Internal Medicine

## 2016-05-26 MED ORDER — LEVOTHYROXINE SODIUM 75 MCG PO TABS: 75.0000 ug | ORAL_TABLET | Freq: Every day | ORAL | 6 refills | Status: DC

## 2016-05-26 NOTE — Assessment & Plan Note (Signed)
Stress is better.  She is doing better.  Still seeing psychiatry.

## 2016-05-26 NOTE — Assessment & Plan Note (Signed)
Off tapazole.  Recently adjusted thyroid medication.  Follow tsh.

## 2016-05-26 NOTE — Assessment & Plan Note (Signed)
Low cholesterol diet and exercise.  Follow lipid panel.   

## 2016-05-27 NOTE — Telephone Encounter (Signed)
Please call her and let her know that apparently the urine was not run.  This is the pt that had abdominal discomfort the day prior to her appt. Was better, but we were going to check a urine.  Not sure what happened.  Can she give [place] another sample.  Sorry for the inconvenience.

## 2016-07-02 ENCOUNTER — Other Ambulatory Visit (INDEPENDENT_AMBULATORY_CARE_PROVIDER_SITE_OTHER): Payer: 59

## 2016-07-02 DIAGNOSIS — E0590 Thyrotoxicosis, unspecified without thyrotoxic crisis or storm: Secondary | ICD-10-CM

## 2016-07-02 DIAGNOSIS — E7800 Pure hypercholesterolemia, unspecified: Secondary | ICD-10-CM | POA: Diagnosis not present

## 2016-07-02 LAB — HEPATIC FUNCTION PANEL
ALBUMIN: 4.5 g/dL (ref 3.5–5.2)
ALK PHOS: 73 U/L (ref 39–117)
ALT: 18 U/L (ref 0–35)
AST: 21 U/L (ref 0–37)
Bilirubin, Direct: 0.1 mg/dL (ref 0.0–0.3)
Total Bilirubin: 0.6 mg/dL (ref 0.2–1.2)
Total Protein: 6.8 g/dL (ref 6.0–8.3)

## 2016-07-02 LAB — BASIC METABOLIC PANEL
BUN: 19 mg/dL (ref 6–23)
CHLORIDE: 103 meq/L (ref 96–112)
CO2: 31 meq/L (ref 19–32)
CREATININE: 0.78 mg/dL (ref 0.40–1.20)
Calcium: 9.4 mg/dL (ref 8.4–10.5)
GFR: 80.49 mL/min (ref >60.00)
Glucose, Bld: 81 mg/dL (ref 70–99)
POTASSIUM: 4.4 meq/L (ref 3.5–5.1)
Sodium: 138 mEq/L (ref 135–145)

## 2016-07-02 LAB — LIPID PANEL
CHOL/HDL RATIO: 3
Cholesterol: 239 mg/dL — ABNORMAL HIGH (ref 0–200)
HDL: 75.8 mg/dL (ref >39.00)
LDL Cholesterol: 147 mg/dL — ABNORMAL HIGH (ref 0–99)
NONHDL: 163.19
Triglycerides: 80 mg/dL (ref 0.0–149.0)
VLDL: 16 mg/dL (ref 0.0–40.0)

## 2016-07-02 LAB — TSH: TSH: 0.15 u[IU]/mL — ABNORMAL LOW (ref 0.35–4.50)

## 2016-07-03 ENCOUNTER — Other Ambulatory Visit: Payer: Self-pay

## 2016-07-03 ENCOUNTER — Other Ambulatory Visit: Payer: Self-pay | Admitting: Internal Medicine

## 2016-07-03 DIAGNOSIS — E039 Hypothyroidism, unspecified: Secondary | ICD-10-CM

## 2016-07-03 MED ORDER — LEVOTHYROXINE SODIUM 50 MCG PO TABS: 50.0000 ug | ORAL_TABLET | Freq: Every day | ORAL | 1 refills | Status: DC

## 2016-07-03 NOTE — Progress Notes (Signed)
Order placed for f/u tsh.  

## 2016-07-05 ENCOUNTER — Telehealth: Payer: Self-pay | Admitting: Internal Medicine

## 2016-07-05 NOTE — Telephone Encounter (Signed)
Message sent to patient regarding changing synthroid dose.

## 2016-07-21 NOTE — Telephone Encounter (Signed)
Per your message I have called patient she had been taking the 96mcg daily and and is not having problems. If anything changes she will call our office

## 2016-07-21 NOTE — Telephone Encounter (Signed)
Noted.  Will check tsh at f/u appt.

## 2016-09-01 ENCOUNTER — Other Ambulatory Visit: Payer: 59

## 2016-09-03 ENCOUNTER — Other Ambulatory Visit (INDEPENDENT_AMBULATORY_CARE_PROVIDER_SITE_OTHER): Payer: 59

## 2016-09-03 DIAGNOSIS — E039 Hypothyroidism, unspecified: Secondary | ICD-10-CM

## 2016-09-03 LAB — TSH: TSH: 10.52 u[IU]/mL — AB (ref 0.35–4.50)

## 2016-09-04 ENCOUNTER — Telehealth: Payer: Self-pay | Admitting: Internal Medicine

## 2016-09-04 ENCOUNTER — Other Ambulatory Visit: Payer: Self-pay | Admitting: Internal Medicine

## 2016-09-04 ENCOUNTER — Ambulatory Visit: Payer: 59 | Admitting: Internal Medicine

## 2016-09-04 MED ORDER — LEVOTHYROXINE SODIUM 75 MCG PO TABS: 75.0000 ug | ORAL_TABLET | Freq: Every day | ORAL | 1 refills | Status: DC

## 2016-09-04 NOTE — Telephone Encounter (Signed)
My chart message sent to pt to notify new dose of synthroid 61mcg q day.  rx sent in.

## 2016-09-04 NOTE — Progress Notes (Signed)
rx sent in for synthroid 48mcg #30 with one refill.

## 2016-10-10 ENCOUNTER — Encounter: Payer: Self-pay | Admitting: Internal Medicine

## 2016-10-10 DIAGNOSIS — E0590 Thyrotoxicosis, unspecified without thyrotoxic crisis or storm: Secondary | ICD-10-CM

## 2016-10-13 NOTE — Telephone Encounter (Signed)
Ok. Thank you.

## 2016-10-15 ENCOUNTER — Other Ambulatory Visit (INDEPENDENT_AMBULATORY_CARE_PROVIDER_SITE_OTHER): Payer: 59

## 2016-10-15 DIAGNOSIS — E0590 Thyrotoxicosis, unspecified without thyrotoxic crisis or storm: Secondary | ICD-10-CM

## 2016-10-15 LAB — TSH: TSH: 4.71 u[IU]/mL — ABNORMAL HIGH (ref 0.35–4.50)

## 2016-10-30 ENCOUNTER — Ambulatory Visit: Payer: 59 | Admitting: Internal Medicine

## 2016-12-25 ENCOUNTER — Telehealth: Payer: Self-pay

## 2016-12-25 NOTE — Telephone Encounter (Signed)
Can we mail this to her?

## 2016-12-25 NOTE — Telephone Encounter (Signed)
Copied from [place]. Topic: General - Other >> Dec 25, 2016 10:12 AM Neva Seat wrote: Pt wants a DPR Form mailed to her ASAP.

## 2016-12-25 NOTE — Telephone Encounter (Signed)
Called pt and let her know it was mailed

## 2016-12-30 ENCOUNTER — Encounter: Payer: Self-pay | Admitting: Internal Medicine

## 2016-12-30 ENCOUNTER — Ambulatory Visit (INDEPENDENT_AMBULATORY_CARE_PROVIDER_SITE_OTHER): Payer: 59 | Admitting: Internal Medicine

## 2016-12-30 VITALS — BP 120/78 | HR 60 | Temp 97.4°F | Resp 18 | Ht 68.5 in | Wt 157.4 lb

## 2016-12-30 DIAGNOSIS — F439 Reaction to severe stress, unspecified: Secondary | ICD-10-CM

## 2016-12-30 DIAGNOSIS — E0590 Thyrotoxicosis, unspecified without thyrotoxic crisis or storm: Secondary | ICD-10-CM | POA: Diagnosis not present

## 2016-12-30 DIAGNOSIS — R5383 Other fatigue: Secondary | ICD-10-CM | POA: Diagnosis not present

## 2016-12-30 DIAGNOSIS — E7800 Pure hypercholesterolemia, unspecified: Secondary | ICD-10-CM

## 2016-12-30 LAB — CBC WITH DIFFERENTIAL/PLATELET
BASOS ABS: 0 10*3/uL (ref 0.0–0.1)
Basophils Relative: 0.6 % (ref 0.0–3.0)
Eosinophils Absolute: 0.1 10*3/uL (ref 0.0–0.7)
Eosinophils Relative: 1.2 % (ref 0.0–5.0)
HCT: 41.9 % (ref 36.0–46.0)
Hemoglobin: 13.9 g/dL (ref 12.0–15.0)
LYMPHS ABS: 2 10*3/uL (ref 0.7–4.0)
Lymphocytes Relative: 40.8 % (ref 12.0–46.0)
MCHC: 33.1 g/dL (ref 30.0–36.0)
MCV: 98.9 fl (ref 78.0–100.0)
MONO ABS: 0.5 10*3/uL (ref 0.1–1.0)
Monocytes Relative: 9.7 % (ref 3.0–12.0)
NEUTROS ABS: 2.4 10*3/uL (ref 1.4–7.7)
NEUTROS PCT: 47.7 % (ref 43.0–77.0)
PLATELETS: 229 10*3/uL (ref 150.0–400.0)
RBC: 4.24 Mil/uL (ref 3.87–5.11)
RDW: 12.9 % (ref 11.5–15.5)
WBC: 5 10*3/uL (ref 4.0–10.5)

## 2016-12-30 LAB — SEDIMENTATION RATE: SED RATE: 3 mm/h (ref 0–30)

## 2016-12-30 LAB — BASIC METABOLIC PANEL
BUN: 17 mg/dL (ref 6–23)
CALCIUM: 9.2 mg/dL (ref 8.4–10.5)
CHLORIDE: 102 meq/L (ref 96–112)
CO2: 29 meq/L (ref 19–32)
Creatinine, Ser: 0.77 mg/dL (ref 0.40–1.20)
GFR: 81.56 mL/min (ref >60.00)
Glucose, Bld: 94 mg/dL (ref 70–99)
Potassium: 4.7 mEq/L (ref 3.5–5.1)
SODIUM: 136 meq/L (ref 135–145)

## 2016-12-30 LAB — TSH: TSH: 3.61 u[IU]/mL (ref 0.35–4.50)

## 2016-12-30 LAB — LIPID PANEL
Cholesterol: 287 mg/dL — ABNORMAL HIGH (ref 0–200)
HDL: 80.5 mg/dL (ref >39.00)
LDL Cholesterol: 184 mg/dL — ABNORMAL HIGH (ref 0–99)
NonHDL: 206.72
Total CHOL/HDL Ratio: 4
Triglycerides: 115 mg/dL (ref 0.0–149.0)
VLDL: 23 mg/dL (ref 0.0–40.0)

## 2016-12-30 LAB — HEPATIC FUNCTION PANEL
ALT: 19 U/L (ref 0–35)
AST: 25 U/L (ref 0–37)
Albumin: 4.6 g/dL (ref 3.5–5.2)
Alkaline Phosphatase: 74 U/L (ref 39–117)
Bilirubin, Direct: 0.1 mg/dL (ref 0.0–0.3)
Total Bilirubin: 0.6 mg/dL (ref 0.2–1.2)
Total Protein: 6.8 g/dL (ref 6.0–8.3)

## 2016-12-30 NOTE — Progress Notes (Signed)
Patient ID: Amber Richardson, female   DOB: [birth year], 61 y.o.   MRN: [id number]   Subjective:    Patient ID: Amber Richardson, female    DOB: 11/11/1955, 62 y.o.   MRN: [id number]  HPI  Patient here for a scheduled follow up.  She is accompanied by her mother.  History obtained from both of them.  She reports increased stress.  States she gets angry or upset easily.  Short tempered.  No actual depression.  Relates to her increased stress at work.  Has good support at home.  Sees psychiatry.  Has appt tomorrow.  She has thyroid issues.  She feels that some of her issues are related to her thyroid.  We have discussed this previously and again discussed today.  We have had to regulate her medications some lately.  Last check almost wnl.  Discussed doubt that all of her issues were related to her thyroid - near normal level on last check.  She request referral back to Dr Lanier Clam (endocrinologist she saw previously when had similar symptoms and thyroid issues).  Discussed her work stress.  She has talked to her boss.  Does not see any changes in the near future.  She is eating.  No vomiting.  No abdominal pain.  Bowels moving.  Breathing stable.     Past Medical History:  Diagnosis Date  . Allergy   . Basal cell carcinoma   . GERD (gastroesophageal reflux disease)   . Hyperthyroidism   . Pyelonephritis 1985   Past Surgical History:  Procedure Laterality Date  . DILATION AND CURETTAGE OF UTERUS    . Miscarriage     x1  . TONSILLECTOMY  1963   Family History  Problem Relation Age of Onset  . Hypertension Mother   . Hypercholesterolemia Mother   . Heart disease Father   . Hyperlipidemia Father   . Hypercholesterolemia Father    Social History   Socioeconomic History  . Marital status: Married    Spouse name: None  . Number of children: 0  . Years of education: None  . Highest education level: None  Social Needs  . Financial resource strain: None  . Food insecurity - worry:  None  . Food insecurity - inability: None  . Transportation needs - medical: None  . Transportation needs - non-medical: None  Occupational History  . None  Tobacco Use  . Smoking status: Never Smoker  . Smokeless tobacco: Never Used  Substance and Sexual Activity  . Alcohol use: No    Alcohol/week: 0.0 oz  . Drug use: No  . Sexual activity: None  Other Topics Concern  . None  Social History Narrative  . None    Outpatient Encounter Medications as of 12/30/2016  Medication Sig  . ALPRAZolam (XANAX) 0.25 MG tablet Take 0.25 mg by mouth 2 (two) times daily as needed for anxiety or sleep.   . Calcium Citrate-Vitamin D (CITRACAL + D PO) Take by mouth. Take one in the morning & 2 in the afternoon  . DULoxetine HCl 40 MG CPEP TAKE 1 ORAL CAPSULE DAILY  . ibuprofen (ADVIL,MOTRIN) 600 MG tablet Take 600 mg by mouth as needed for pain.  Marland Kitchen levothyroxine (SYNTHROID, LEVOTHROID) 75 MCG tablet Take 1 tablet (75 mcg total) by mouth daily before breakfast.  . Multiple Vitamin (MULTIVITAMIN) tablet Take 1 tablet by mouth daily.  . risedronate (ACTONEL) 150 MG tablet Take 1 tablet (150 mg total) by mouth every 30 (thirty) days. with water on  empty stomach, nothing by mouth or lie down for next 30 minutes.  . valACYclovir (VALTREX) 500 MG tablet Take one to two tablets by mouth daily  . [DISCONTINUED] alendronate (FOSAMAX) 70 MG tablet Take 1 tablet (70 mg total) by mouth every 7 (seven) days. Take with a full glass of water on an empty stomach.   No facility-administered encounter medications on file as of 12/30/2016.     Review of Systems  Constitutional: Positive for fatigue. Negative for unexpected weight change.  HENT: Negative for congestion and sinus pressure.   Respiratory: Negative for cough and chest tightness.        Breathing stable.   Cardiovascular: Negative for chest pain, palpitations and leg swelling.  Gastrointestinal: Negative for abdominal pain, diarrhea, nausea and vomiting.   Genitourinary: Negative for difficulty urinating and dysuria.  Musculoskeletal: Negative for gait problem and joint swelling.  Skin: Negative for color change and rash.  Neurological: Negative for dizziness, light-headedness and headaches.  Psychiatric/Behavioral:       Increased stress as outlined.  Denies depression.         Objective:    Physical Exam  Constitutional: She appears well-developed and well-nourished. No distress.  HENT:  Nose: Nose normal.  Mouth/Throat: Oropharynx is clear and moist.  Neck: Neck supple. No thyromegaly present.  Cardiovascular: Normal rate and regular rhythm.  Pulmonary/Chest: Breath sounds normal. No respiratory distress. She has no wheezes.  Abdominal: Soft. Bowel sounds are normal. There is no tenderness.  Musculoskeletal: She exhibits no edema or tenderness.  Lymphadenopathy:    She has no cervical adenopathy.  Skin: No rash noted. No erythema.  Psychiatric: She has a normal mood and affect. Her behavior is normal.    BP 120/78   Pulse 60   Temp (!) 97.4 F (36.3 C)   Resp 18   Ht 5' 8.5" (1.74 m)   Wt 157 lb 6 oz (71.4 kg)   LMP 01/13/2008   SpO2 98%   BMI 23.58 kg/m  Wt Readings from Last 3 Encounters:  12/30/16 157 lb 6 oz (71.4 kg)  05/20/16 156 lb 3.2 oz (70.9 kg)  03/25/16 154 lb 9.6 oz (70.1 kg)     Lab Results  Component Value Date   WBC 5.0 12/30/2016   HGB 13.9 12/30/2016   HCT 41.9 12/30/2016   PLT 229.0 12/30/2016   GLUCOSE 94 12/30/2016   CHOL 287 (H) 12/30/2016   TRIG 115.0 12/30/2016   HDL 80.50 12/30/2016   LDLDIRECT 165.6 03/23/2012   LDLCALC 184 (H) 12/30/2016   ALT 19 12/30/2016   AST 25 12/30/2016   NA 136 12/30/2016   K 4.7 12/30/2016   CL 102 12/30/2016   CREATININE 0.77 12/30/2016   BUN 17 12/30/2016   CO2 29 12/30/2016   TSH 3.61 12/30/2016       Assessment & Plan:   Problem List Items Addressed This Visit    Fatigue    Feel is multifactorial.  Increased stress.  Will check cbc,  met c and tsh. She also request inflammation test.        Relevant Orders   CBC with Differential/Platelet (Completed)   Basic metabolic panel (Completed)   Sedimentation rate (Completed)   Hypercholesterolemia    Low cholesterol diet and exercise.  She is off cholesterol medication.  Recheck lipid panel.        Relevant Orders   Lipid panel (Completed)   Hepatic function panel (Completed)   Hyperthyroidism - Primary  Off tapazole.  TSH has varied recently.  On synthroid.  Last check near normal.  Will recheck today.  She feels a lot of her symptoms are related to her thyroid.  We discussed this at length.  I explained to her that I did not feel all of her symptoms related to her thyroid - especially given near normal tsh on last check.  Discussed stress.  Plans to see psychiatry.  She request referral back to Dr Lanier Clam - endocrinologist she saw previously.        Relevant Orders   TSH (Completed)   Ambulatory referral to Endocrinology   Stress    Increased stress.  Discussed at length with her today.  Increased job stress. Has appt with psychiatry tomorrow.  Denies depression.            Einar Pheasant, MD

## 2016-12-31 ENCOUNTER — Encounter: Payer: Self-pay | Admitting: Internal Medicine

## 2017-01-02 ENCOUNTER — Encounter: Payer: Self-pay | Admitting: Internal Medicine

## 2017-01-02 NOTE — Assessment & Plan Note (Signed)
Low cholesterol diet and exercise.  She is off cholesterol medication.  Recheck lipid panel.

## 2017-01-02 NOTE — Telephone Encounter (Signed)
Copy of lab results mailed to patient.

## 2017-01-02 NOTE — Assessment & Plan Note (Signed)
Feel is multifactorial.  Increased stress.  Will check cbc, met c and tsh. She also request inflammation test.

## 2017-01-02 NOTE — Assessment & Plan Note (Signed)
Increased stress.  Discussed at length with her today.  Increased job stress. Has appt with psychiatry tomorrow.  Denies depression.

## 2017-01-02 NOTE — Assessment & Plan Note (Signed)
Off tapazole.  TSH has varied recently.  On synthroid.  Last check near normal.  Will recheck today.  She feels a lot of her symptoms are related to her thyroid.  We discussed this at length.  I explained to her that I did not feel all of her symptoms related to her thyroid - especially given near normal tsh on last check.  Discussed stress.  Plans to see psychiatry.  She request referral back to Dr Lanier Clam - endocrinologist she saw previously.

## 2017-02-19 ENCOUNTER — Ambulatory Visit: Payer: 59 | Admitting: Internal Medicine

## 2017-02-26 ENCOUNTER — Other Ambulatory Visit: Payer: Self-pay | Admitting: Internal Medicine

## 2017-06-04 ENCOUNTER — Other Ambulatory Visit: Payer: Self-pay | Admitting: Internal Medicine

## 2017-06-28 ENCOUNTER — Other Ambulatory Visit: Payer: Self-pay | Admitting: Internal Medicine

## 2017-07-24 ENCOUNTER — Other Ambulatory Visit: Payer: Self-pay | Admitting: Internal Medicine

## 2017-09-08 ENCOUNTER — Ambulatory Visit (INDEPENDENT_AMBULATORY_CARE_PROVIDER_SITE_OTHER): Payer: 59 | Admitting: Internal Medicine

## 2017-09-08 VITALS — BP 118/76 | HR 70 | Temp 98.1°F | Ht 69.0 in | Wt 152.0 lb

## 2017-09-08 DIAGNOSIS — R0600 Dyspnea, unspecified: Secondary | ICD-10-CM | POA: Diagnosis not present

## 2017-09-08 DIAGNOSIS — E7800 Pure hypercholesterolemia, unspecified: Secondary | ICD-10-CM

## 2017-09-08 DIAGNOSIS — E0590 Thyrotoxicosis, unspecified without thyrotoxic crisis or storm: Secondary | ICD-10-CM | POA: Diagnosis not present

## 2017-09-08 DIAGNOSIS — M810 Age-related osteoporosis without current pathological fracture: Secondary | ICD-10-CM | POA: Diagnosis not present

## 2017-09-08 DIAGNOSIS — R5383 Other fatigue: Secondary | ICD-10-CM

## 2017-09-08 DIAGNOSIS — F439 Reaction to severe stress, unspecified: Secondary | ICD-10-CM

## 2017-09-08 NOTE — Progress Notes (Signed)
Subjective:    Patient ID: Amber Richardson, female    DOB: 09-May-1955, 62 y.o.   MRN: [id number]  HPI  Patient here for a scheduled follow up.  She reports increased stress.  Stress with her job and her family's medical issues.  Discussed with her today.  She is seeing a psychiatrist.  Plans to see a therapist for counseling as well.  Has been clinching her teeth.  This contributes to some occasional headaches.  No headache reported currently.  Increased fatigue.  Has noticed some chest pressure - intermittent.  With some increased activity, has noticed some sob.  No acid reflux.  No abdominal pain.  Bowels moving.  No urine change.     Past Medical History:  Diagnosis Date  . Allergy   . Basal cell carcinoma   . GERD (gastroesophageal reflux disease)   . Hyperthyroidism   . Pyelonephritis 1985   Past Surgical History:  Procedure Laterality Date  . DILATION AND CURETTAGE OF UTERUS    . Miscarriage     x1  . TONSILLECTOMY  1963   Family History  Problem Relation Age of Onset  . Hypertension Mother   . Hypercholesterolemia Mother   . Heart disease Father   . Hyperlipidemia Father   . Hypercholesterolemia Father    Social History   Socioeconomic History  . Marital status: Married    Spouse name: Not on file  . Number of children: 0  . Years of education: Not on file  . Highest education level: Not on file  Occupational History  . Not on file  Social Needs  . Financial resource strain: Not on file  . Food insecurity:    Worry: Not on file    Inability: Not on file  . Transportation needs:    Medical: Not on file    Non-medical: Not on file  Tobacco Use  . Smoking status: Never Smoker  . Smokeless tobacco: Never Used  Substance and Sexual Activity  . Alcohol use: No    Alcohol/week: 0.0 standard drinks  . Drug use: No  . Sexual activity: Not on file  Lifestyle  . Physical activity:    Days per week: Not on file    Minutes per session: Not on file  . Stress:  Not on file  Relationships  . Social connections:    Talks on phone: Not on file    Gets together: Not on file    Attends religious service: Not on file    Active member of club or organization: Not on file    Attends meetings of clubs or organizations: Not on file    Relationship status: Not on file  Other Topics Concern  . Not on file  Social History Narrative  . Not on file    Outpatient Encounter Medications as of 09/08/2017  Medication Sig  . ALPRAZolam (XANAX) 0.25 MG tablet Take 0.25 mg by mouth 2 (two) times daily as needed for anxiety or sleep.   . Calcium Citrate-Vitamin D (CITRACAL + D PO) Take by mouth. Take one in the morning & 2 in the afternoon  . DULoxetine HCl 40 MG CPEP TAKE 1 ORAL CAPSULE DAILY  . ibuprofen (ADVIL,MOTRIN) 600 MG tablet Take 600 mg by mouth as needed for pain.  Marland Kitchen levothyroxine (SYNTHROID, LEVOTHROID) 75 MCG tablet Take 1 tablet (75 mcg total) by mouth daily before breakfast.  . levothyroxine (SYNTHROID, LEVOTHROID) 75 MCG tablet TAKE 1 TABLET (75 MCG TOTAL) BY MOUTH DAILY BEFORE BREAKFAST.  Marland Kitchen  Multiple Vitamin (MULTIVITAMIN) tablet Take 1 tablet by mouth daily.  . risedronate (ACTONEL) 150 MG tablet TAKE 1 TAB EVERY 30 DAYS WITH WATER ON EMPTY STOMACH-NOTHING BY MOUTH OR LIE DOWN FOR 30MIN  . valACYclovir (VALTREX) 500 MG tablet Take one to two tablets by mouth daily   No facility-administered encounter medications on file as of 09/08/2017.     Review of Systems  Constitutional: Positive for fatigue. Negative for appetite change and unexpected weight change.  HENT: Negative for congestion and sinus pressure.   Respiratory: Positive for chest tightness and shortness of breath. Negative for cough.   Cardiovascular: Negative for palpitations and leg swelling.  Gastrointestinal: Negative for abdominal pain, diarrhea, nausea and vomiting.  Genitourinary: Negative for difficulty urinating and dysuria.  Musculoskeletal: Negative for joint swelling and  myalgias.  Skin: Negative for color change and rash.  Neurological: Negative for dizziness and light-headedness.       Occasional headache.    Psychiatric/Behavioral: Negative for agitation and dysphoric mood.       Objective:     Blood pressure rechecked by me:  128/78  Physical Exam  Constitutional: She appears well-developed and well-nourished. No distress.  HENT:  Nose: Nose normal.  Mouth/Throat: Oropharynx is clear and moist.  Neck: Neck supple. No thyromegaly present.  Cardiovascular: Normal rate, regular rhythm and normal pulses.  Pulmonary/Chest: Breath sounds normal. No respiratory distress. She has no wheezes.  Abdominal: Soft. Bowel sounds are normal. There is no tenderness.  Musculoskeletal: She exhibits no edema or tenderness.       Right lower leg: She exhibits no tenderness and no edema.       Left lower leg: She exhibits no tenderness and no edema.  Lymphadenopathy:    She has no cervical adenopathy.  Skin: No rash noted. No erythema.  Psychiatric: She has a normal mood and affect. Her behavior is normal.    BP 118/76   Pulse 70   Temp 98.1 F (36.7 C) (Oral)   Ht 5\' 9"  (1.753 m)   Wt 152 lb (68.9 kg)   LMP 01/13/2008   SpO2 98%   BMI 22.45 kg/m  Wt Readings from Last 3 Encounters:  09/08/17 152 lb (68.9 kg)  12/30/16 157 lb 6 oz (71.4 kg)  05/20/16 156 lb 3.2 oz (70.9 kg)     Lab Results  Component Value Date   WBC 5.0 12/30/2016   HGB 13.9 12/30/2016   HCT 41.9 12/30/2016   PLT 229.0 12/30/2016   GLUCOSE 94 12/30/2016   CHOL 287 (H) 12/30/2016   TRIG 115.0 12/30/2016   HDL 80.50 12/30/2016   LDLDIRECT 165.6 03/23/2012   LDLCALC 184 (H) 12/30/2016   ALT 19 12/30/2016   AST 25 12/30/2016   NA 136 12/30/2016   K 4.7 12/30/2016   CL 102 12/30/2016   CREATININE 0.77 12/30/2016   BUN 17 12/30/2016   CO2 29 12/30/2016   TSH 3.61 12/30/2016    Mr Brain W Wo Contrast  Result Date: 07/11/2015 CLINICAL DATA:  Left facial numbness EXAM:  MRI HEAD WITHOUT AND WITH CONTRAST TECHNIQUE: Multiplanar, multiecho pulse sequences of the brain and surrounding structures were obtained without and with intravenous contrast. CONTRAST:  62mL MULTIHANCE GADOBENATE DIMEGLUMINE 529 MG/ML IV SOLN COMPARISON:  None. FINDINGS: Ventricle size normal.  Cerebral volume normal. Negative for acute or chronic infarction Negative for demyelinating disease. Cerebral white matter normal. Brainstem and cerebellum normal. Negative for hemorrhage or fluid collection. Negative for mass or edema. Cavernous sinus  is normal. Skull base normal. Pituitary not enlarged. Postcontrast imaging demonstrates normal enhancement. No enhancing mass lesion. Normal vascular enhancement. Paranasal sinuses clear.  Normal orbit. IMPRESSION: Negative Electronically Signed   By: Franchot Gallo M.D.   On: 07/11/2015 09:49       Assessment & Plan:   Problem List Items Addressed This Visit    Dyspnea - Primary    SOB with exertion.  Intermittent chest pressure.  EKG - SR with no acute ischemic changes.  Discussed further cardiac evaluation with referral to cardiology - for question of need for any further cardiac testing.  Treat anxiety.  Follow.        Relevant Orders   EKG 12-Lead (Completed)   Fatigue    Feel is multifactorial.  Will check labs as outlined, including cbc and tsh.  Also EKG - SR with no acute ischemic changes.  Discussed further cardiac w/up - referral to cardiology to confirm no further w/up warranted.  Pt agrees.  Increased stress.  Seeing psychiatry.  Planning to start counseling.        Hypercholesterolemia    Off cholesterol medication.  Low cholesterol diet and exercise.  Check lipid panel.  May need to restart.        Relevant Orders   CBC with Differential/Platelet   Hepatic function panel   Lipid panel   Basic metabolic panel   Hyperthyroidism    Off tapazole.  TSH has varied.  On synthroid.  Recheck tsh with next labs.        Relevant Orders    TSH   Osteoporosis    Continue on fosamax.       Relevant Orders   VITAMIN D 25 Hydroxy (Vit-D Deficiency, Fractures)   Stress    Increased stress as outlined.  Discussed with her today.  Seeing psychiatry.  Planning to start counseling.  Follow.           Einar Pheasant, MD

## 2017-09-10 ENCOUNTER — Telehealth: Payer: Self-pay | Admitting: *Deleted

## 2017-09-10 NOTE — Telephone Encounter (Signed)
Please place future orders for lab appt next week.

## 2017-09-11 NOTE — Telephone Encounter (Signed)
Orders placed for labs

## 2017-09-13 ENCOUNTER — Encounter: Payer: Self-pay | Admitting: Internal Medicine

## 2017-09-13 DIAGNOSIS — R0600 Dyspnea, unspecified: Secondary | ICD-10-CM | POA: Insufficient documentation

## 2017-09-13 NOTE — Assessment & Plan Note (Signed)
Continue on fosamax.

## 2017-09-13 NOTE — Assessment & Plan Note (Signed)
SOB with exertion.  Intermittent chest pressure.  EKG - SR with no acute ischemic changes.  Discussed further cardiac evaluation with referral to cardiology - for question of need for any further cardiac testing.  Treat anxiety.  Follow.

## 2017-09-13 NOTE — Assessment & Plan Note (Signed)
Increased stress as outlined.  Discussed with her today.  Seeing psychiatry.  Planning to start counseling.  Follow.

## 2017-09-13 NOTE — Assessment & Plan Note (Signed)
Feel is multifactorial.  Will check labs as outlined, including cbc and tsh.  Also EKG - SR with no acute ischemic changes.  Discussed further cardiac w/up - referral to cardiology to confirm no further w/up warranted.  Pt agrees.  Increased stress.  Seeing psychiatry.  Planning to start counseling.

## 2017-09-13 NOTE — Assessment & Plan Note (Signed)
Off cholesterol medication.  Low cholesterol diet and exercise.  Check lipid panel.  May need to restart.

## 2017-09-13 NOTE — Assessment & Plan Note (Signed)
Off tapazole.  TSH has varied.  On synthroid.  Recheck tsh with next labs.

## 2017-09-17 ENCOUNTER — Other Ambulatory Visit (INDEPENDENT_AMBULATORY_CARE_PROVIDER_SITE_OTHER): Payer: 59

## 2017-09-17 DIAGNOSIS — E0590 Thyrotoxicosis, unspecified without thyrotoxic crisis or storm: Secondary | ICD-10-CM | POA: Diagnosis not present

## 2017-09-17 DIAGNOSIS — M810 Age-related osteoporosis without current pathological fracture: Secondary | ICD-10-CM | POA: Diagnosis not present

## 2017-09-17 DIAGNOSIS — E7800 Pure hypercholesterolemia, unspecified: Secondary | ICD-10-CM

## 2017-09-17 LAB — CBC WITH DIFFERENTIAL/PLATELET
BASOS PCT: 0.5 % (ref 0.0–3.0)
Basophils Absolute: 0 10*3/uL (ref 0.0–0.1)
EOS PCT: 1.3 % (ref 0.0–5.0)
Eosinophils Absolute: 0.1 10*3/uL (ref 0.0–0.7)
HEMATOCRIT: 39.9 % (ref 36.0–46.0)
Hemoglobin: 13.3 g/dL (ref 12.0–15.0)
Lymphocytes Relative: 38.8 % (ref 12.0–46.0)
Lymphs Abs: 2 10*3/uL (ref 0.7–4.0)
MCHC: 33.2 g/dL (ref 30.0–36.0)
MCV: 97 fl (ref 78.0–100.0)
MONOS PCT: 8.8 % (ref 3.0–12.0)
Monocytes Absolute: 0.4 10*3/uL (ref 0.1–1.0)
Neutro Abs: 2.6 10*3/uL (ref 1.4–7.7)
Neutrophils Relative %: 50.6 % (ref 43.0–77.0)
Platelets: 224 10*3/uL (ref 150.0–400.0)
RBC: 4.12 Mil/uL (ref 3.87–5.11)
RDW: 13.1 % (ref 11.5–15.5)
WBC: 5.1 10*3/uL (ref 4.0–10.5)

## 2017-09-17 LAB — LIPID PANEL
Cholesterol: 242 mg/dL — ABNORMAL HIGH (ref 0–200)
HDL: 74 mg/dL (ref >39.00)
LDL Cholesterol: 144 mg/dL — ABNORMAL HIGH (ref 0–99)
NonHDL: 168.19
TRIGLYCERIDES: 120 mg/dL (ref 0.0–149.0)
Total CHOL/HDL Ratio: 3
VLDL: 24 mg/dL (ref 0.0–40.0)

## 2017-09-17 LAB — BASIC METABOLIC PANEL
BUN: 19 mg/dL (ref 6–23)
CALCIUM: 9.3 mg/dL (ref 8.4–10.5)
CHLORIDE: 105 meq/L (ref 96–112)
CO2: 29 meq/L (ref 19–32)
CREATININE: 0.91 mg/dL (ref 0.40–1.20)
GFR: 67.1 mL/min (ref >60.00)
Glucose, Bld: 88 mg/dL (ref 70–99)
Potassium: 3.9 mEq/L (ref 3.5–5.1)
Sodium: 140 mEq/L (ref 135–145)

## 2017-09-17 LAB — HEPATIC FUNCTION PANEL
ALK PHOS: 80 U/L (ref 39–117)
ALT: 16 U/L (ref 0–35)
AST: 22 U/L (ref 0–37)
Albumin: 4.2 g/dL (ref 3.5–5.2)
BILIRUBIN DIRECT: 0.1 mg/dL (ref 0.0–0.3)
TOTAL PROTEIN: 6.7 g/dL (ref 6.0–8.3)
Total Bilirubin: 0.7 mg/dL (ref 0.2–1.2)

## 2017-09-17 LAB — VITAMIN D 25 HYDROXY (VIT D DEFICIENCY, FRACTURES): VITD: 24.96 ng/mL — AB (ref 30.00–100.00)

## 2017-09-17 LAB — TSH: TSH: 1.44 u[IU]/mL (ref 0.35–4.50)

## 2017-09-22 ENCOUNTER — Encounter: Payer: Self-pay | Admitting: Internal Medicine

## 2017-12-16 ENCOUNTER — Ambulatory Visit (INDEPENDENT_AMBULATORY_CARE_PROVIDER_SITE_OTHER): Payer: 59 | Admitting: Internal Medicine

## 2017-12-16 VITALS — BP 122/70 | HR 60 | Temp 97.6°F | Resp 18 | Wt 156.8 lb

## 2017-12-16 DIAGNOSIS — E039 Hypothyroidism, unspecified: Secondary | ICD-10-CM | POA: Diagnosis not present

## 2017-12-16 DIAGNOSIS — F439 Reaction to severe stress, unspecified: Secondary | ICD-10-CM

## 2017-12-16 DIAGNOSIS — M810 Age-related osteoporosis without current pathological fracture: Secondary | ICD-10-CM

## 2017-12-16 DIAGNOSIS — E0590 Thyrotoxicosis, unspecified without thyrotoxic crisis or storm: Secondary | ICD-10-CM

## 2017-12-16 DIAGNOSIS — R42 Dizziness and giddiness: Secondary | ICD-10-CM

## 2017-12-16 DIAGNOSIS — E7800 Pure hypercholesterolemia, unspecified: Secondary | ICD-10-CM

## 2017-12-16 DIAGNOSIS — R079 Chest pain, unspecified: Secondary | ICD-10-CM

## 2017-12-16 LAB — TSH: TSH: 1.08 u[IU]/mL (ref 0.35–4.50)

## 2017-12-16 MED ORDER — IPRATROPIUM BROMIDE 0.03 % NA SOLN: 2.0000 | Freq: Two times a day (BID) | NASAL | 1 refills | Status: DC

## 2017-12-16 NOTE — Progress Notes (Signed)
Patient ID: Amber Richardson, female   DOB: [birth year], 62 y.o.   MRN: [id number]   Subjective:    Patient ID: Amber Richardson, female    DOB: Dec 19, 1955, 62 y.o.   MRN: [id number]  HPI  Patient here for a scheduled follow up.  She is accompanied by her husband.  History obtained from both of them.  She has recently been evaluated for chest discomfort and light headedness - by cardiology.  S/p CTA coronaries - no atherosclerotic disease and no luminal narrowing.  Planning f/u with cardiology.  Still having intermittent chest pressure.  States occurs most days.  Localized over the sternum and anterior chest.  No cough or congestion.  No acid reflux.  No abdominal pain.  Bowels moving.  Does report having persistent issues with dizziness/room spinning.  Notices when rolls over in bed and certain position changes.  No headache.  No sinus congestion.     Past Medical History:  Diagnosis Date  . Allergy   . Basal cell carcinoma   . GERD (gastroesophageal reflux disease)   . Hyperthyroidism   . Pyelonephritis 1985   Past Surgical History:  Procedure Laterality Date  . DILATION AND CURETTAGE OF UTERUS    . Miscarriage     x1  . TONSILLECTOMY  1963   Family History  Problem Relation Age of Onset  . Hypertension Mother   . Hypercholesterolemia Mother   . Heart disease Father   . Hyperlipidemia Father   . Hypercholesterolemia Father    Social History   Socioeconomic History  . Marital status: Married    Spouse name: Not on file  . Number of children: 0  . Years of education: Not on file  . Highest education level: Not on file  Occupational History  . Not on file  Social Needs  . Financial resource strain: Not on file  . Food insecurity:    Worry: Not on file    Inability: Not on file  . Transportation needs:    Medical: Not on file    Non-medical: Not on file  Tobacco Use  . Smoking status: Never Smoker  . Smokeless tobacco: Never Used  Substance and Sexual Activity  .  Alcohol use: No    Alcohol/week: 0.0 standard drinks  . Drug use: No  . Sexual activity: Not on file  Lifestyle  . Physical activity:    Days per week: Not on file    Minutes per session: Not on file  . Stress: Not on file  Relationships  . Social connections:    Talks on phone: Not on file    Gets together: Not on file    Attends religious service: Not on file    Active member of club or organization: Not on file    Attends meetings of clubs or organizations: Not on file    Relationship status: Not on file  Other Topics Concern  . Not on file  Social History Narrative  . Not on file    Outpatient Encounter Medications as of 12/16/2017  Medication Sig  . ALPRAZolam (XANAX) 0.5 MG tablet Take by mouth.  . Calcium Citrate-Vitamin D (CITRACAL + D PO) Take by mouth. Take one in the morning & 2 in the afternoon  . DULoxetine HCl 40 MG CPEP TAKE 1 ORAL CAPSULE DAILY  . ibuprofen (ADVIL,MOTRIN) 600 MG tablet Take 600 mg by mouth as needed for pain.  Marland Kitchen ipratropium (ATROVENT) 0.03 % nasal spray Place 2 sprays into both nostrils every 12 (  twelve) hours.  Marland Kitchen levothyroxine (SYNTHROID, LEVOTHROID) 75 MCG tablet TAKE 1 TABLET (75 MCG TOTAL) BY MOUTH DAILY BEFORE BREAKFAST.  . Multiple Vitamin (MULTIVITAMIN) tablet Take 1 tablet by mouth daily.  . risedronate (ACTONEL) 150 MG tablet TAKE 1 TAB EVERY 30 DAYS WITH WATER ON EMPTY STOMACH-NOTHING BY MOUTH OR LIE DOWN FOR 30MIN  . valACYclovir (VALTREX) 500 MG tablet Take one to two tablets by mouth daily  . [DISCONTINUED] ALPRAZolam (XANAX) 0.25 MG tablet Take 0.25 mg by mouth 2 (two) times daily as needed for anxiety or sleep.   . [DISCONTINUED] levothyroxine (SYNTHROID, LEVOTHROID) 75 MCG tablet Take 1 tablet (75 mcg total) by mouth daily before breakfast.   No facility-administered encounter medications on file as of 12/16/2017.     Review of Systems  Constitutional: Negative for appetite change and unexpected weight change.  HENT: Negative  for congestion and sinus pressure.   Respiratory: Negative for cough, chest tightness and shortness of breath.   Cardiovascular: Negative for chest pain, palpitations and leg swelling.  Gastrointestinal: Negative for abdominal pain, diarrhea, nausea and vomiting.  Genitourinary: Negative for difficulty urinating and dysuria.  Musculoskeletal: Negative for joint swelling and myalgias.  Skin: Negative for color change and rash.  Neurological: Positive for dizziness and light-headedness. Negative for headaches.  Psychiatric/Behavioral: Negative for agitation and dysphoric mood.       Increased stress as outlined.         Objective:    Physical Exam  Constitutional: She appears well-developed and well-nourished. No distress.  HENT:  Nose: Nose normal.  Mouth/Throat: Oropharynx is clear and moist.  Neck: Neck supple. No thyromegaly present.  Cardiovascular: Normal rate and regular rhythm.  Pulmonary/Chest: Breath sounds normal. No respiratory distress. She has no wheezes.  Abdominal: Soft. Bowel sounds are normal. There is no tenderness.  Musculoskeletal: She exhibits no edema or tenderness.  Lymphadenopathy:    She has no cervical adenopathy.  Skin: No rash noted. No erythema.  Psychiatric: She has a normal mood and affect. Her behavior is normal.    BP 122/70 (BP Location: Left Arm, Patient Position: Sitting, Cuff Size: Normal)   Pulse 60   Temp 97.6 F (36.4 C) (Oral)   Resp 18   Wt 156 lb 12.8 oz (71.1 kg)   LMP 01/13/2008   SpO2 98%   BMI 23.16 kg/m  Wt Readings from Last 3 Encounters:  12/16/17 156 lb 12.8 oz (71.1 kg)  09/08/17 152 lb (68.9 kg)  12/30/16 157 lb 6 oz (71.4 kg)     Lab Results  Component Value Date   WBC 5.1 09/17/2017   HGB 13.3 09/17/2017   HCT 39.9 09/17/2017   PLT 224.0 09/17/2017   GLUCOSE 88 09/17/2017   CHOL 242 (H) 09/17/2017   TRIG 120.0 09/17/2017   HDL 74.00 09/17/2017   LDLDIRECT 165.6 03/23/2012   LDLCALC 144 (H) 09/17/2017    ALT 16 09/17/2017   AST 22 09/17/2017   NA 140 09/17/2017   K 3.9 09/17/2017   CL 105 09/17/2017   CREATININE 0.91 09/17/2017   BUN 19 09/17/2017   CO2 29 09/17/2017   TSH 1.08 12/16/2017    Mr Brain W Wo Contrast  Result Date: 07/11/2015 CLINICAL DATA:  Left facial numbness EXAM: MRI HEAD WITHOUT AND WITH CONTRAST TECHNIQUE: Multiplanar, multiecho pulse sequences of the brain and surrounding structures were obtained without and with intravenous contrast. CONTRAST:  49mL MULTIHANCE GADOBENATE DIMEGLUMINE 529 MG/ML IV SOLN COMPARISON:  None. FINDINGS: Ventricle size normal.  Cerebral volume normal. Negative for acute or chronic infarction Negative for demyelinating disease. Cerebral white matter normal. Brainstem and cerebellum normal. Negative for hemorrhage or fluid collection. Negative for mass or edema. Cavernous sinus is normal. Skull base normal. Pituitary not enlarged. Postcontrast imaging demonstrates normal enhancement. No enhancing mass lesion. Normal vascular enhancement. Paranasal sinuses clear.  Normal orbit. IMPRESSION: Negative Electronically Signed   By: Franchot Gallo M.D.   On: 07/11/2015 09:49       Assessment & Plan:   Problem List Items Addressed This Visit    Chest pain    Has been worked up by cardiology.  CTA coronaries - ok.  Persistent pain.  Discussed further w/up.  She declines.  Wants to follow.        Dizziness    Dizziness as outlined.  Room spinning.  Not reproducible on exam.  Is a persistent problem.  Refer to ENT as outlined.        Relevant Orders   Ambulatory referral to ENT   Hypercholesterolemia    Off cholesterol medication.  Low cholesterol diet and exercise.  Follow lipid panel.        Hyperthyroidism    Off tapazole.  TSH has varied.  On synthroid.  Follow tsh.        Osteoporosis    Continue on actonel.        Stress    Increased stress as outlined.  Discussed with her today.  Seeing psychiatry.  Seeing a Social worker.  Overall she  feels she is doing better.  Follow.         Other Visit Diagnoses    Hypothyroidism, unspecified type    -  Primary   Relevant Orders   TSH (Completed)       Einar Pheasant, MD

## 2017-12-17 ENCOUNTER — Encounter: Payer: Self-pay | Admitting: Internal Medicine

## 2017-12-19 ENCOUNTER — Encounter: Payer: Self-pay | Admitting: Internal Medicine

## 2017-12-19 DIAGNOSIS — R42 Dizziness and giddiness: Secondary | ICD-10-CM | POA: Insufficient documentation

## 2017-12-19 NOTE — Assessment & Plan Note (Signed)
Continue on actonel.

## 2017-12-19 NOTE — Assessment & Plan Note (Signed)
Increased stress as outlined.  Discussed with her today.  Seeing psychiatry.  Seeing a Social worker.  Overall she feels she is doing better.  Follow.

## 2017-12-19 NOTE — Assessment & Plan Note (Signed)
Off cholesterol medication.  Low cholesterol diet and exercise.  Follow lipid panel.  

## 2017-12-19 NOTE — Assessment & Plan Note (Signed)
Has been worked up by cardiology.  CTA coronaries - ok.  Persistent pain.  Discussed further w/up.  She declines.  Wants to follow.

## 2017-12-19 NOTE — Assessment & Plan Note (Signed)
Dizziness as outlined.  Room spinning.  Not reproducible on exam.  Is a persistent problem.  Refer to ENT as outlined.

## 2017-12-19 NOTE — Assessment & Plan Note (Signed)
Off tapazole.  TSH has varied.  On synthroid.  Follow tsh.

## 2018-02-15 ENCOUNTER — Other Ambulatory Visit: Payer: Self-pay | Admitting: Internal Medicine

## 2018-03-03 ENCOUNTER — Encounter: Payer: Self-pay | Admitting: Internal Medicine

## 2018-04-07 ENCOUNTER — Ambulatory Visit: Payer: 59 | Admitting: Internal Medicine

## 2018-05-06 ENCOUNTER — Other Ambulatory Visit: Payer: Self-pay | Admitting: Internal Medicine

## 2018-06-30 DIAGNOSIS — F4322 Adjustment disorder with anxiety: Secondary | ICD-10-CM | POA: Diagnosis not present

## 2018-06-30 DIAGNOSIS — R69 Illness, unspecified: Secondary | ICD-10-CM | POA: Diagnosis not present

## 2018-06-30 DIAGNOSIS — Z733 Stress, not elsewhere classified: Secondary | ICD-10-CM | POA: Diagnosis not present

## 2018-07-10 DIAGNOSIS — J029 Acute pharyngitis, unspecified: Secondary | ICD-10-CM | POA: Diagnosis not present

## 2018-07-10 DIAGNOSIS — R05 Cough: Secondary | ICD-10-CM | POA: Diagnosis not present

## 2018-07-28 DIAGNOSIS — Z733 Stress, not elsewhere classified: Secondary | ICD-10-CM | POA: Diagnosis not present

## 2018-07-28 DIAGNOSIS — R69 Illness, unspecified: Secondary | ICD-10-CM | POA: Diagnosis not present

## 2018-07-28 DIAGNOSIS — F4322 Adjustment disorder with anxiety: Secondary | ICD-10-CM | POA: Diagnosis not present

## 2018-08-25 DIAGNOSIS — Z733 Stress, not elsewhere classified: Secondary | ICD-10-CM | POA: Diagnosis not present

## 2018-08-25 DIAGNOSIS — F4322 Adjustment disorder with anxiety: Secondary | ICD-10-CM | POA: Diagnosis not present

## 2018-08-25 DIAGNOSIS — R69 Illness, unspecified: Secondary | ICD-10-CM | POA: Diagnosis not present

## 2018-08-26 ENCOUNTER — Other Ambulatory Visit: Payer: Self-pay

## 2018-08-30 ENCOUNTER — Other Ambulatory Visit: Payer: Self-pay

## 2018-08-30 ENCOUNTER — Ambulatory Visit (INDEPENDENT_AMBULATORY_CARE_PROVIDER_SITE_OTHER): Payer: 59 | Admitting: Internal Medicine

## 2018-08-30 ENCOUNTER — Encounter: Payer: Self-pay | Admitting: Internal Medicine

## 2018-08-30 DIAGNOSIS — M546 Pain in thoracic spine: Secondary | ICD-10-CM | POA: Diagnosis not present

## 2018-08-30 DIAGNOSIS — M542 Cervicalgia: Secondary | ICD-10-CM

## 2018-08-30 DIAGNOSIS — M79601 Pain in right arm: Secondary | ICD-10-CM | POA: Diagnosis not present

## 2018-08-30 DIAGNOSIS — E7800 Pure hypercholesterolemia, unspecified: Secondary | ICD-10-CM | POA: Diagnosis not present

## 2018-08-30 DIAGNOSIS — F439 Reaction to severe stress, unspecified: Secondary | ICD-10-CM

## 2018-08-30 DIAGNOSIS — R5383 Other fatigue: Secondary | ICD-10-CM | POA: Diagnosis not present

## 2018-08-30 DIAGNOSIS — M79602 Pain in left arm: Secondary | ICD-10-CM | POA: Diagnosis not present

## 2018-08-30 DIAGNOSIS — R69 Illness, unspecified: Secondary | ICD-10-CM | POA: Diagnosis not present

## 2018-08-30 NOTE — Progress Notes (Signed)
Virtual Visit via video Note  This visit type was conducted due to national recommendations for restrictions regarding the COVID-19 pandemic (e.g. social distancing).  This format is felt to be most appropriate for this patient at this time.  All issues noted in this document were discussed and addressed.  No physical exam was performed (except for noted visual exam findings with Video Visits).   I connected with Amber Richardson by a video enabled telemedicine application and verified that I am speaking with the correct person using two identifiers. Location patient: home Location provider: work  Persons participating in the virtual visit: patient, provider  I discussed the limitations, risks, security and privacy concerns of performing an evaluation and management service by video and the availability of in person appointments. The patient expressed understanding and agreed to proceed.   Reason for visit: scheduled follow up.    HPI: Reports increased stress.  Increased stress with work and family health issues.  She is seeing psychiatry every 4 weeks.  On cymbalta and scheduled xanax.  Discussed counseling.  She declines.  Does have good relationship with her psychiatrist.  Not exercising as much.  No chest pain reported.  Breathing stable.  No acid reflux.  No abdominal pain.  Bowels moving.  Does report persistent neck and upper back pain.  States pain started weeks ago.  Occurred after she was setting on the floor and was pushing heavy boxes with her feet and legs.  Pain involving upper back- across shoulders.  No numbness or tingling.  Sees gyn for pelvic exams.     ROS: See pertinent positives and negatives per HPI.  Past Medical History:  Diagnosis Date  . Allergy   . Basal cell carcinoma   . GERD (gastroesophageal reflux disease)   . Hyperthyroidism   . Pyelonephritis 1985    Past Surgical History:  Procedure Laterality Date  . DILATION AND CURETTAGE OF UTERUS    .  Miscarriage     x1  . TONSILLECTOMY  1963    Family History  Problem Relation Age of Onset  . Hypertension Mother   . Hypercholesterolemia Mother   . Heart disease Father   . Hyperlipidemia Father   . Hypercholesterolemia Father     SOCIAL HX: reviewed.    Current Outpatient Medications:  .  ALPRAZolam (XANAX) 0.5 MG tablet, Take by mouth., Disp: , Rfl:  .  Calcium Citrate-Vitamin D (CITRACAL + D PO), Take by mouth. Take one in the morning & 2 in the afternoon, Disp: , Rfl:  .  DULoxetine HCl 40 MG CPEP, TAKE 1 ORAL CAPSULE DAILY, Disp: , Rfl: 1 .  ibuprofen (ADVIL,MOTRIN) 600 MG tablet, Take 600 mg by mouth as needed for pain., Disp: , Rfl:  .  ipratropium (ATROVENT) 0.03 % nasal spray, PLACE 2 SPRAYS INTO BOTH NOSTRILS EVERY 12 (TWELVE) HOURS., Disp: 16 mL, Rfl: 1 .  levothyroxine (SYNTHROID, LEVOTHROID) 75 MCG tablet, TAKE 1 TABLET (75 MCG TOTAL) BY MOUTH DAILY BEFORE BREAKFAST., Disp: 90 tablet, Rfl: 2 .  valACYclovir (VALTREX) 500 MG tablet, Take one to two tablets by mouth daily, Disp: , Rfl:  .  Multiple Vitamin (MULTIVITAMIN) tablet, Take 1 tablet by mouth daily., Disp: , Rfl:  .  risedronate (ACTONEL) 150 MG tablet, TAKE 1 TAB EVERY 30 DAYS WITH WATER ON EMPTY STOMACH-NOTHING BY MOUTH OR LIE DOWN FOR 30MIN (Patient not taking: Reported on 08/30/2018), Disp: 12 tablet, Rfl: 0  EXAM:  GENERAL: alert, oriented, appears well and in  no acute distress  HEENT: atraumatic, conjunttiva clear, no obvious abnormalities on inspection of external nose and ears  NECK: normal movements of the head and neck  LUNGS: on inspection no signs of respiratory distress, breathing rate appears normal, no obvious gross SOB, gasping or wheezing  CV: no obvious cyanosis  PSYCH/NEURO: pleasant and cooperative, no obvious depression or anxiety, speech and thought processing grossly intact  ASSESSMENT AND PLAN:  Discussed the following assessment and plan:  Hypercholesterolemia Off cholesterol  medication.  Low cholesterol diet and exercise.  Follow lipid panel.    Fatigue Reports increased fatigue.  States she is concerned her "thyroid is off".  Discussed fatigue is probably multifactorial.  Increased stress.  Working and taking care of her family.  Will check routine labs including cbc and tsh.    Stress Increased stress as outlined.  Discussed with her today.  Seeing psychiatry.  On cymbalta.  Follow.    Neck pain Persistent neck pain and upper back pain.  Persistent.  Check c-spine xray and thoracic spine xray.  Further w/up and treatment pending results.      I discussed the assessment and treatment plan with the patient. The patient was provided an opportunity to ask questions and all were answered. The patient agreed with the plan and demonstrated an understanding of the instructions.   The patient was advised to call back or seek an in-person evaluation if the symptoms worsen or if the condition fails to improve as anticipated.   Einar Pheasant, MD

## 2018-09-02 ENCOUNTER — Other Ambulatory Visit: Payer: Self-pay

## 2018-09-02 ENCOUNTER — Other Ambulatory Visit (INDEPENDENT_AMBULATORY_CARE_PROVIDER_SITE_OTHER): Payer: 59

## 2018-09-02 ENCOUNTER — Ambulatory Visit (INDEPENDENT_AMBULATORY_CARE_PROVIDER_SITE_OTHER): Payer: 59

## 2018-09-02 DIAGNOSIS — R5383 Other fatigue: Secondary | ICD-10-CM

## 2018-09-02 DIAGNOSIS — M47812 Spondylosis without myelopathy or radiculopathy, cervical region: Secondary | ICD-10-CM | POA: Diagnosis not present

## 2018-09-02 DIAGNOSIS — M79601 Pain in right arm: Secondary | ICD-10-CM

## 2018-09-02 DIAGNOSIS — M79602 Pain in left arm: Secondary | ICD-10-CM

## 2018-09-02 DIAGNOSIS — M546 Pain in thoracic spine: Secondary | ICD-10-CM | POA: Diagnosis not present

## 2018-09-02 LAB — CBC WITH DIFFERENTIAL/PLATELET
Basophils Absolute: 0 10*3/uL (ref 0.0–0.1)
Basophils Relative: 0.5 % (ref 0.0–3.0)
Eosinophils Absolute: 0 10*3/uL (ref 0.0–0.7)
Eosinophils Relative: 0.8 % (ref 0.0–5.0)
HCT: 40 % (ref 36.0–46.0)
Hemoglobin: 13.1 g/dL (ref 12.0–15.0)
Lymphocytes Relative: 36.2 % (ref 12.0–46.0)
Lymphs Abs: 2.2 10*3/uL (ref 0.7–4.0)
MCHC: 32.8 g/dL (ref 30.0–36.0)
MCV: 98.7 fl (ref 78.0–100.0)
Monocytes Absolute: 0.4 10*3/uL (ref 0.1–1.0)
Monocytes Relative: 7.1 % (ref 3.0–12.0)
Neutro Abs: 3.3 10*3/uL (ref 1.4–7.7)
Neutrophils Relative %: 55.4 % (ref 43.0–77.0)
Platelets: 241 10*3/uL (ref 150.0–400.0)
RBC: 4.05 Mil/uL (ref 3.87–5.11)
RDW: 13.3 % (ref 11.5–15.5)
WBC: 6 10*3/uL (ref 4.0–10.5)

## 2018-09-02 LAB — TSH: TSH: 3.29 u[IU]/mL (ref 0.35–4.50)

## 2018-09-02 LAB — COMPREHENSIVE METABOLIC PANEL
ALT: 15 U/L (ref 0–35)
AST: 18 U/L (ref 0–37)
Albumin: 4.7 g/dL (ref 3.5–5.2)
Alkaline Phosphatase: 98 U/L (ref 39–117)
BUN: 18 mg/dL (ref 6–23)
CO2: 26 mEq/L (ref 19–32)
Calcium: 9.7 mg/dL (ref 8.4–10.5)
Chloride: 104 mEq/L (ref 96–112)
Creatinine, Ser: 0.95 mg/dL (ref 0.40–1.20)
GFR: 59.88 mL/min — ABNORMAL LOW (ref >60.00)
Glucose, Bld: 151 mg/dL — ABNORMAL HIGH (ref 70–99)
Potassium: 4.3 mEq/L (ref 3.5–5.1)
Sodium: 139 mEq/L (ref 135–145)
Total Bilirubin: 0.5 mg/dL (ref 0.2–1.2)
Total Protein: 6.8 g/dL (ref 6.0–8.3)

## 2018-09-03 ENCOUNTER — Other Ambulatory Visit: Payer: Self-pay | Admitting: Internal Medicine

## 2018-09-03 DIAGNOSIS — R739 Hyperglycemia, unspecified: Secondary | ICD-10-CM

## 2018-09-03 DIAGNOSIS — E7800 Pure hypercholesterolemia, unspecified: Secondary | ICD-10-CM

## 2018-09-03 NOTE — Progress Notes (Signed)
Orders placed for f/u labs.  

## 2018-09-04 ENCOUNTER — Encounter: Payer: Self-pay | Admitting: Internal Medicine

## 2018-09-04 DIAGNOSIS — M542 Cervicalgia: Secondary | ICD-10-CM | POA: Insufficient documentation

## 2018-09-04 NOTE — Assessment & Plan Note (Signed)
Persistent neck pain and upper back pain.  Persistent.  Check c-spine xray and thoracic spine xray.  Further w/up and treatment pending results.

## 2018-09-04 NOTE — Assessment & Plan Note (Signed)
Increased stress as outlined.  Discussed with her today.  Seeing psychiatry.  On cymbalta.  Follow.

## 2018-09-04 NOTE — Assessment & Plan Note (Signed)
Reports increased fatigue.  States she is concerned her "thyroid is off".  Discussed fatigue is probably multifactorial.  Increased stress.  Working and taking care of her family.  Will check routine labs including cbc and tsh.

## 2018-09-04 NOTE — Assessment & Plan Note (Signed)
Off cholesterol medication.  Low cholesterol diet and exercise.  Follow lipid panel.  

## 2018-09-22 ENCOUNTER — Other Ambulatory Visit: Payer: Self-pay

## 2018-09-22 ENCOUNTER — Other Ambulatory Visit (INDEPENDENT_AMBULATORY_CARE_PROVIDER_SITE_OTHER): Payer: 59

## 2018-09-22 DIAGNOSIS — R739 Hyperglycemia, unspecified: Secondary | ICD-10-CM | POA: Diagnosis not present

## 2018-09-22 DIAGNOSIS — E7800 Pure hypercholesterolemia, unspecified: Secondary | ICD-10-CM | POA: Diagnosis not present

## 2018-09-22 LAB — LIPID PANEL
Cholesterol: 295 mg/dL — ABNORMAL HIGH (ref 0–200)
HDL: 81.6 mg/dL (ref >39.00)
LDL Cholesterol: 187 mg/dL — ABNORMAL HIGH (ref 0–99)
NonHDL: 213.84
Total CHOL/HDL Ratio: 4
Triglycerides: 135 mg/dL (ref 0.0–149.0)
VLDL: 27 mg/dL (ref 0.0–40.0)

## 2018-09-22 LAB — BASIC METABOLIC PANEL
BUN: 13 mg/dL (ref 6–23)
CO2: 29 mEq/L (ref 19–32)
Calcium: 9.4 mg/dL (ref 8.4–10.5)
Chloride: 102 mEq/L (ref 96–112)
Creatinine, Ser: 0.88 mg/dL (ref 0.40–1.20)
GFR: 65.39 mL/min (ref >60.00)
Glucose, Bld: 94 mg/dL (ref 70–99)
Potassium: 4.3 mEq/L (ref 3.5–5.1)
Sodium: 137 mEq/L (ref 135–145)

## 2018-09-22 LAB — HEMOGLOBIN A1C: Hgb A1c MFr Bld: 5.7 % (ref 4.6–6.5)

## 2018-09-27 ENCOUNTER — Ambulatory Visit: Payer: Self-pay | Admitting: *Deleted

## 2018-09-27 MED ORDER — PRAVASTATIN SODIUM 20 MG PO TABS: 10.0000 mg | ORAL_TABLET | ORAL | 0 refills | Status: DC

## 2018-09-27 NOTE — Telephone Encounter (Signed)
Message from Loma Boston sent at 09/27/2018 4:35 PM EDT  Summary: FU lab   pls follow up with pt on lab results/ NT has a busy signal

## 2018-09-27 NOTE — Telephone Encounter (Signed)
Patient notified of lab results.  She is agreeable to taking Pravastatin 10 mg Monday, Wednesday, and Friday per recommendations from Dr. Nicki Reaper on lab results note from 09/23/2018.  Pravastatin 10 mg sent in per Dr. Bary Leriche order.  Patient scheduled for 6 week liver function test 11/08/2018.

## 2018-09-30 ENCOUNTER — Other Ambulatory Visit: Payer: Self-pay | Admitting: Internal Medicine

## 2018-09-30 DIAGNOSIS — E0590 Thyrotoxicosis, unspecified without thyrotoxic crisis or storm: Secondary | ICD-10-CM

## 2018-09-30 NOTE — Progress Notes (Signed)
Order placed for f/u tsh.  

## 2018-10-01 ENCOUNTER — Other Ambulatory Visit: Payer: Self-pay

## 2018-10-01 ENCOUNTER — Other Ambulatory Visit (INDEPENDENT_AMBULATORY_CARE_PROVIDER_SITE_OTHER): Payer: 59

## 2018-10-01 DIAGNOSIS — E0590 Thyrotoxicosis, unspecified without thyrotoxic crisis or storm: Secondary | ICD-10-CM | POA: Diagnosis not present

## 2018-10-01 LAB — TSH: TSH: 2.83 u[IU]/mL (ref 0.35–4.50)

## 2018-10-01 NOTE — Addendum Note (Signed)
Addended by: Leeanne Rio on: 10/01/2018 02:40 PM   Modules accepted: Orders

## 2018-10-01 NOTE — Addendum Note (Signed)
Addended by: Leeanne Rio on: 10/01/2018 02:10 PM   Modules accepted: Orders

## 2018-10-02 ENCOUNTER — Encounter: Payer: Self-pay | Admitting: Internal Medicine

## 2018-10-11 DIAGNOSIS — Z733 Stress, not elsewhere classified: Secondary | ICD-10-CM | POA: Diagnosis not present

## 2018-10-11 DIAGNOSIS — R69 Illness, unspecified: Secondary | ICD-10-CM | POA: Diagnosis not present

## 2018-10-11 DIAGNOSIS — F4322 Adjustment disorder with anxiety: Secondary | ICD-10-CM | POA: Diagnosis not present

## 2018-10-23 ENCOUNTER — Ambulatory Visit: Payer: 59

## 2018-11-01 DIAGNOSIS — R0789 Other chest pain: Secondary | ICD-10-CM | POA: Diagnosis not present

## 2018-11-01 DIAGNOSIS — E7800 Pure hypercholesterolemia, unspecified: Secondary | ICD-10-CM | POA: Diagnosis not present

## 2018-11-05 ENCOUNTER — Telehealth: Payer: Self-pay | Admitting: *Deleted

## 2018-11-05 DIAGNOSIS — E7800 Pure hypercholesterolemia, unspecified: Secondary | ICD-10-CM

## 2018-11-05 NOTE — Telephone Encounter (Signed)
Order placed for f/u liver panel.  

## 2018-11-05 NOTE — Telephone Encounter (Signed)
Please place future orders for lab appt.  

## 2018-11-08 ENCOUNTER — Encounter: Payer: Self-pay | Admitting: Internal Medicine

## 2018-11-08 ENCOUNTER — Other Ambulatory Visit (INDEPENDENT_AMBULATORY_CARE_PROVIDER_SITE_OTHER): Payer: 59

## 2018-11-08 ENCOUNTER — Other Ambulatory Visit: Payer: Self-pay

## 2018-11-08 DIAGNOSIS — E7800 Pure hypercholesterolemia, unspecified: Secondary | ICD-10-CM | POA: Diagnosis not present

## 2018-11-08 LAB — HEPATIC FUNCTION PANEL
ALT: 16 U/L (ref 0–35)
AST: 22 U/L (ref 0–37)
Albumin: 4.4 g/dL (ref 3.5–5.2)
Alkaline Phosphatase: 98 U/L (ref 39–117)
Bilirubin, Direct: 0.1 mg/dL (ref 0.0–0.3)
Total Bilirubin: 0.5 mg/dL (ref 0.2–1.2)
Total Protein: 6.8 g/dL (ref 6.0–8.3)

## 2018-11-27 DIAGNOSIS — B9689 Other specified bacterial agents as the cause of diseases classified elsewhere: Secondary | ICD-10-CM | POA: Diagnosis not present

## 2018-11-27 DIAGNOSIS — J329 Chronic sinusitis, unspecified: Secondary | ICD-10-CM | POA: Diagnosis not present

## 2018-12-09 DIAGNOSIS — F4322 Adjustment disorder with anxiety: Secondary | ICD-10-CM | POA: Diagnosis not present

## 2018-12-09 DIAGNOSIS — R69 Illness, unspecified: Secondary | ICD-10-CM | POA: Diagnosis not present

## 2018-12-09 DIAGNOSIS — Z733 Stress, not elsewhere classified: Secondary | ICD-10-CM | POA: Diagnosis not present

## 2019-01-06 ENCOUNTER — Ambulatory Visit (INDEPENDENT_AMBULATORY_CARE_PROVIDER_SITE_OTHER): Payer: 59 | Admitting: Internal Medicine

## 2019-01-06 ENCOUNTER — Other Ambulatory Visit: Payer: Self-pay

## 2019-01-06 ENCOUNTER — Telehealth: Payer: Self-pay | Admitting: Internal Medicine

## 2019-01-06 DIAGNOSIS — M79602 Pain in left arm: Secondary | ICD-10-CM

## 2019-01-06 DIAGNOSIS — E0590 Thyrotoxicosis, unspecified without thyrotoxic crisis or storm: Secondary | ICD-10-CM | POA: Diagnosis not present

## 2019-01-06 DIAGNOSIS — R739 Hyperglycemia, unspecified: Secondary | ICD-10-CM

## 2019-01-06 DIAGNOSIS — F439 Reaction to severe stress, unspecified: Secondary | ICD-10-CM | POA: Diagnosis not present

## 2019-01-06 DIAGNOSIS — E7800 Pure hypercholesterolemia, unspecified: Secondary | ICD-10-CM

## 2019-01-06 DIAGNOSIS — R69 Illness, unspecified: Secondary | ICD-10-CM | POA: Diagnosis not present

## 2019-01-06 NOTE — Progress Notes (Signed)
Patient ID: Amber Richardson, female   DOB: [birth year], 63 y.o.   MRN: [id number]   Virtual Visit via video Note  This visit type was conducted due to national recommendations for restrictions regarding the COVID-19 pandemic (e.g. social distancing).  This format is felt to be most appropriate for this patient at this time.  All issues noted in this document were discussed and addressed.  No physical exam was performed (except for noted visual exam findings with Video Visits).   I connected with  Amber Richardson by a video enabled telemedicine application and verified that I am speaking with the correct person using two identifiers. Location patient: home Location provider: work or home office Persons participating in the virtual visit: patient, provider  I discussed the limitations, risks, security and privacy concerns of performing an evaluation and management service by video and the availability of in person appointments.The patient expressed understanding and agreed to proceed.    Reason for visit:  Scheduled follow up.   HPI: She reports she is doing relatively well.  Still with increased stress.  Increased stress with work, family and dealing with caregivers, etc.  She is seeing psychiatry.  On cymbalta and xanax bid.  This is helping.  Able to better manage the stress.  Had a week off last week.  Had four days where she was able to get caught up on some things she needed to do (for work).  This has taken some pressure off her.  She is taking care of her mother and father and trying to help with her brother's dental and medical care.  Working long hours at [employer].  Overall managing.  Not exercising as much.  Had f/u with her cardiologist.  Decided to hold on cholesterol medication at this time. Intolerant to previous statin medication.  Discussed the possibility of repatha in the future.  Did not report chest pain or sob.  May need a laxative q month, but overall bowels are stable.  Is  having some persistent left arm pain.  Worse if lifts.  Discussed f/u with ortho.  She will call.    ROS: See pertinent positives and negatives per HPI.  Past Medical History:  Diagnosis Date  . Allergy   . Basal cell carcinoma   . GERD (gastroesophageal reflux disease)   . Hyperthyroidism   . Pyelonephritis 1985    Past Surgical History:  Procedure Laterality Date  . DILATION AND CURETTAGE OF UTERUS    . Miscarriage     x1  . TONSILLECTOMY  1963    Family History  Problem Relation Age of Onset  . Hypertension Mother   . Hypercholesterolemia Mother   . Heart disease Father   . Hyperlipidemia Father   . Hypercholesterolemia Father     SOCIAL HX: reviewed.    Current Outpatient Medications:  .  ALPRAZolam (XANAX) 0.5 MG tablet, Take by mouth., Disp: , Rfl:  .  Calcium Citrate-Vitamin D (CITRACAL + D PO), Take by mouth. Take one in the morning & 2 in the afternoon, Disp: , Rfl:  .  DULoxetine HCl 40 MG CPEP, TAKE 1 ORAL CAPSULE DAILY, Disp: , Rfl: 1 .  ibuprofen (ADVIL,MOTRIN) 600 MG tablet, Take 600 mg by mouth as needed for pain., Disp: , Rfl:  .  ipratropium (ATROVENT) 0.03 % nasal spray, PLACE 2 SPRAYS INTO BOTH NOSTRILS EVERY 12 (TWELVE) HOURS., Disp: 16 mL, Rfl: 1 .  levothyroxine (SYNTHROID, LEVOTHROID) 75 MCG tablet, TAKE 1 TABLET (75 MCG TOTAL) BY  MOUTH DAILY BEFORE BREAKFAST., Disp: 90 tablet, Rfl: 2 .  Multiple Vitamin (MULTIVITAMIN) tablet, Take 1 tablet by mouth daily., Disp: , Rfl:  .  valACYclovir (VALTREX) 500 MG tablet, Take one to two tablets by mouth daily, Disp: , Rfl:   EXAM:  GENERAL: alert, oriented, appears well and in no acute distress  HEENT: atraumatic, conjunttiva clear, no obvious abnormalities on inspection of external nose and ears  NECK: normal movements of the head and neck  LUNGS: on inspection no signs of respiratory distress, breathing rate appears normal, no obvious gross SOB, gasping or wheezing  CV: no obvious  cyanosis  PSYCH/NEURO: pleasant and cooperative, no obvious depression or anxiety, speech and thought processing grossly intact  ASSESSMENT AND PLAN:  Discussed the following assessment and plan:  Hypercholesterolemia Off cholesterol medication.  She discussed with her cardiologist.  Holding on starting cholesterol medication at this time.  May consider repatha in future.  Low cholesterol diet and exercise.    Hyperthyroidism Off tapazole.  TSH has varied.  Currently on synthroid.  Follow tsh.   Stress Increased stress as outlined.  Discussed with her today.  Had a week off last week.  Seeing psychiatry.  On duloxetine and alprazolam.  Overall handling things relatively well.  Follow.    Left arm pain Left arm pain as outlined.  Plans to f/u with ortho.     I discussed the assessment and treatment plan with the patient. The patient was provided an opportunity to ask questions and all were answered. The patient agreed with the plan and demonstrated an understanding of the instructions.   The patient was advised to call back or seek an in-person evaluation if the symptoms worsen or if the condition fails to improve as anticipated.   Einar Pheasant, MD

## 2019-01-06 NOTE — Telephone Encounter (Signed)
Called and lm for pt to call back and schedule the following appt Fasting labs in 72m and a follow up in 43m

## 2019-01-09 ENCOUNTER — Encounter: Payer: Self-pay | Admitting: Internal Medicine

## 2019-01-09 DIAGNOSIS — M79602 Pain in left arm: Secondary | ICD-10-CM | POA: Insufficient documentation

## 2019-01-09 NOTE — Assessment & Plan Note (Signed)
Off cholesterol medication.  She discussed with her cardiologist.  Holding on starting cholesterol medication at this time.  May consider repatha in future.  Low cholesterol diet and exercise.

## 2019-01-09 NOTE — Assessment & Plan Note (Signed)
Off tapazole.  TSH has varied.  Currently on synthroid.  Follow tsh.

## 2019-01-09 NOTE — Assessment & Plan Note (Signed)
Left arm pain as outlined.  Plans to f/u with ortho.

## 2019-01-09 NOTE — Assessment & Plan Note (Signed)
Increased stress as outlined.  Discussed with her today.  Had a week off last week.  Seeing psychiatry.  On duloxetine and alprazolam.  Overall handling things relatively well.  Follow.

## 2019-01-17 ENCOUNTER — Other Ambulatory Visit: Payer: Self-pay | Admitting: Internal Medicine

## 2019-02-03 DIAGNOSIS — R69 Illness, unspecified: Secondary | ICD-10-CM | POA: Diagnosis not present

## 2019-02-03 DIAGNOSIS — Z733 Stress, not elsewhere classified: Secondary | ICD-10-CM | POA: Diagnosis not present

## 2019-02-10 ENCOUNTER — Other Ambulatory Visit: Payer: Self-pay | Admitting: Internal Medicine

## 2019-02-12 DIAGNOSIS — Z20828 Contact with and (suspected) exposure to other viral communicable diseases: Secondary | ICD-10-CM | POA: Diagnosis not present

## 2019-02-12 DIAGNOSIS — Z03818 Encounter for observation for suspected exposure to other biological agents ruled out: Secondary | ICD-10-CM | POA: Diagnosis not present

## 2019-02-14 ENCOUNTER — Ambulatory Visit (INDEPENDENT_AMBULATORY_CARE_PROVIDER_SITE_OTHER): Payer: No Typology Code available for payment source | Admitting: Internal Medicine

## 2019-02-14 ENCOUNTER — Other Ambulatory Visit: Payer: Self-pay

## 2019-02-14 ENCOUNTER — Telehealth: Payer: Self-pay

## 2019-02-14 DIAGNOSIS — F439 Reaction to severe stress, unspecified: Secondary | ICD-10-CM

## 2019-02-14 DIAGNOSIS — Z20822 Contact with and (suspected) exposure to covid-19: Secondary | ICD-10-CM | POA: Diagnosis not present

## 2019-02-14 DIAGNOSIS — R69 Illness, unspecified: Secondary | ICD-10-CM | POA: Diagnosis not present

## 2019-02-14 NOTE — Telephone Encounter (Signed)
Pt has multiple questions. Scheduled virtual follow up this PM to discuss with PCP

## 2019-02-14 NOTE — Progress Notes (Signed)
Patient ID: Amber Richardson, female   DOB: 1955-04-26, 64 y.o.   MRN: [id number]   Virtual Visit via video Note  This visit type was conducted due to national recommendations for restrictions regarding the COVID-19 pandemic (e.g. social distancing).  This format is felt to be most appropriate for this patient at this time.  All issues noted in this document were discussed and addressed.  No physical exam was performed (except for noted visual exam findings with Video Visits).   I connected with [name] by a video enabled telemedicine application and verified that I am speaking with the correct person using two identifiers. Location patient: home Location provider: work Persons participating in the virtual visit: patient, provider  The limitations, risks, security and privacy concerns of performing an evaluation and management service by telephone and the availability of in person appointments have been discussed. The patient expressed understanding and agreed to proceed.   Reason for visit: work in appt  HPI: Reports that her father's caretaker tested positive.  (her boyfriend tested positive) and then she tested positive - Tuesday 02/08/19. Caretaker in her father's home and her and her family - exposed.  She is currently doing well.  no significant symptoms.  No fever, chills or body aches.  No sinus congestion, chest congestion, cough or sob.  Eating.  No nausea or vomiting.  Occasional runny nose.  Was tested - negative.  Nasal swab caused headache.  Resolved now.  She had questions about returning to work.  Her work is requiring her to return given negative test.  Discussed the need to quarantine.  Offered work note.      ROS: See pertinent positives and negatives per HPI.  Past Medical History:  Diagnosis Date  . Allergy   . Basal cell carcinoma   . GERD (gastroesophageal reflux disease)   . Hyperthyroidism   . Pyelonephritis 1985    Past Surgical History:  Procedure  Laterality Date  . DILATION AND CURETTAGE OF UTERUS    . Miscarriage     x1  . TONSILLECTOMY  1963    Family History  Problem Relation Age of Onset  . Hypertension Mother   . Hypercholesterolemia Mother   . Heart disease Father   . Hyperlipidemia Father   . Hypercholesterolemia Father     SOCIAL HX: reviewed.    Current Outpatient Medications:  .  ALPRAZolam (XANAX) 0.5 MG tablet, Take by mouth., Disp: , Rfl:  .  Calcium Citrate-Vitamin D (CITRACAL + D PO), Take by mouth. Take one in the morning & 2 in the afternoon, Disp: , Rfl:  .  DULoxetine HCl 40 MG CPEP, TAKE 1 ORAL CAPSULE DAILY, Disp: , Rfl: 1 .  ibuprofen (ADVIL,MOTRIN) 600 MG tablet, Take 600 mg by mouth as needed for pain., Disp: , Rfl:  .  ipratropium (ATROVENT) 0.03 % nasal spray, PLACE 2 SPRAYS INTO BOTH NOSTRILS EVERY 12 (TWELVE) HOURS., Disp: 16 mL, Rfl: 1 .  levothyroxine (SYNTHROID) 75 MCG tablet, TAKE 1 TABLET (75 MCG TOTAL) BY MOUTH DAILY BEFORE BREAKFAST., Disp: 90 tablet, Rfl: 1 .  Multiple Vitamin (MULTIVITAMIN) tablet, Take 1 tablet by mouth daily., Disp: , Rfl:  .  valACYclovir (VALTREX) 500 MG tablet, Take one to two tablets by mouth daily, Disp: , Rfl:   EXAM:  GENERAL: alert, oriented, appears well and in no acute distress  HEENT: atraumatic, conjunttiva clear, no obvious abnormalities on inspection of external nose and ears  NECK: normal movements of the head and neck  LUNGS: on inspection no signs of respiratory distress, breathing rate appears normal, no obvious gross SOB, gasping or wheezing  CV: no obvious cyanosis  PSYCH/NEURO: pleasant and cooperative, no obvious depression or anxiety, speech and thought processing grossly intact  ASSESSMENT AND PLAN:  Discussed the following assessment and plan:  Close exposure to COVID-19 virus Discussed exposure.  She is currently without significant symptoms. covid test negative.  Discussed the need to quarantine.  Work requiring her to return to  work.  Offered work note.  She reports having to return.  Discussed importance of social distancing, wearing mask, etc.  Follow closely. Call if questions or problems.    Stress Increased stress.  Have discussed with her.  On duloxetine.  Follow.    I discussed the assessment and treatment plan with the patient. The patient was provided an opportunity to ask questions and all were answered. The patient agreed with the plan and demonstrated an understanding of the instructions.   The patient was advised to call back or seek an in-person evaluation if the symptoms worsen or if the condition fails to improve as anticipated.   Einar Pheasant, MD

## 2019-02-14 NOTE — Telephone Encounter (Signed)
Received call from pt stating that she was exposed to [place]. Dads care giver was in the house Tuesday morning (1/12) for about 2 hours. No masks were worn. Care givers boyfriend was positive. Care givers test came back positive. Patient and her whole family was tested on Saturday (1/16) and resulted negative. Patient stated she is not having any symptoms and neither are either one of her parents. It is required for her to return to work tomorrow since her test is negative. She is fine with going back to work but stated if you wanted to keep her out she would need note stating that it is medically necessary.

## 2019-02-14 NOTE — Telephone Encounter (Signed)
I would recommend since in the house for extended time and exposed, that she follow the 14 day quarantine even though test is negative.

## 2019-02-17 ENCOUNTER — Other Ambulatory Visit: Payer: No Typology Code available for payment source

## 2019-02-20 ENCOUNTER — Encounter: Payer: Self-pay | Admitting: Internal Medicine

## 2019-02-20 DIAGNOSIS — Z8616 Personal history of COVID-19: Secondary | ICD-10-CM | POA: Insufficient documentation

## 2019-02-20 NOTE — Assessment & Plan Note (Signed)
Discussed exposure.  She is currently without significant symptoms. covid test negative.  Discussed the need to quarantine.  Work requiring her to return to work.  Offered work note.  She reports having to return.  Discussed importance of social distancing, wearing mask, etc.  Follow closely. Call if questions or problems.

## 2019-02-20 NOTE — Assessment & Plan Note (Signed)
Increased stress.  Have discussed with her.  On duloxetine.  Follow.

## 2019-02-22 ENCOUNTER — Ambulatory Visit: Payer: No Typology Code available for payment source | Attending: Internal Medicine

## 2019-02-22 DIAGNOSIS — Z20822 Contact with and (suspected) exposure to covid-19: Secondary | ICD-10-CM

## 2019-02-22 DIAGNOSIS — Z8616 Personal history of COVID-19: Secondary | ICD-10-CM

## 2019-02-22 HISTORY — DX: Personal history of COVID-19: Z86.16

## 2019-02-23 LAB — NOVEL CORONAVIRUS, NAA: SARS-CoV-2, NAA: DETECTED — AB

## 2019-02-27 ENCOUNTER — Encounter: Payer: Self-pay | Admitting: Internal Medicine

## 2019-02-28 ENCOUNTER — Ambulatory Visit (INDEPENDENT_AMBULATORY_CARE_PROVIDER_SITE_OTHER): Payer: No Typology Code available for payment source | Admitting: Family

## 2019-02-28 ENCOUNTER — Telehealth: Payer: Self-pay | Admitting: Family

## 2019-02-28 ENCOUNTER — Encounter: Payer: Self-pay | Admitting: Family

## 2019-02-28 ENCOUNTER — Telehealth: Payer: Self-pay | Admitting: Lab

## 2019-02-28 ENCOUNTER — Telehealth: Payer: Self-pay | Admitting: Pulmonary Disease

## 2019-02-28 ENCOUNTER — Other Ambulatory Visit: Payer: Self-pay

## 2019-02-28 ENCOUNTER — Telehealth: Payer: Self-pay | Admitting: Internal Medicine

## 2019-02-28 VITALS — Ht 68.0 in | Wt 160.0 lb

## 2019-02-28 DIAGNOSIS — U071 COVID-19: Secondary | ICD-10-CM

## 2019-02-28 DIAGNOSIS — Z20822 Contact with and (suspected) exposure to covid-19: Secondary | ICD-10-CM | POA: Diagnosis not present

## 2019-02-28 MED ORDER — ALBUTEROL SULFATE HFA 108 (90 BASE) MCG/ACT IN AERS: 2.0000 | INHALATION_SPRAY | Freq: Four times a day (QID) | RESPIRATORY_TRACT | 1 refills | Status: DC | PRN

## 2019-02-28 MED ORDER — HYDROCODONE-HOMATROPINE 5-1.5 MG/5ML PO SYRP: 5.0000 mL | ORAL_SOLUTION | Freq: Every evening | ORAL | 0 refills | Status: DC | PRN

## 2019-02-28 MED ORDER — PREDNISONE 10 MG PO TABS: ORAL_TABLET | ORAL | 0 refills | Status: DC

## 2019-02-28 MED ORDER — ONDANSETRON 4 MG PO TBDP: 4.0000 mg | ORAL_TABLET | Freq: Three times a day (TID) | ORAL | 0 refills | Status: DC | PRN

## 2019-02-28 NOTE — Telephone Encounter (Signed)
Call pt I have sent in a prednisone taper and I am happy to do so. ensure patient plans to go to [facility] for chest x-ray.  I sent in the taper to [facility].

## 2019-02-28 NOTE — Telephone Encounter (Signed)
Please call and notify Ms Amber Richardson or her daughter [name] - that I have placed the order for the cxr.

## 2019-02-28 NOTE — Telephone Encounter (Signed)
See below

## 2019-02-28 NOTE — Telephone Encounter (Signed)
See other message I sent you.  Duplicate.

## 2019-02-28 NOTE — Telephone Encounter (Signed)
This is the patient you saw this am (that we discussed).

## 2019-02-28 NOTE — Telephone Encounter (Signed)
LM that she was able to go to [facility] to have xray done but needed to make sure she let them know upon arrival that she was COVID positive. I told patient that I would try her back to  make sure that she heard my VM.

## 2019-02-28 NOTE — Telephone Encounter (Signed)
Called back and spoke to Pt mother.  Pt mother wanted to know if Dr.Scott will be sending a steroid pack in for the Pt. Ot would like it sent to [facility].

## 2019-02-28 NOTE — Telephone Encounter (Signed)
02/28/2019  Received referral via the monoclonal antibody infusion hotline.  I was able to successfully contact the patient.  Patient reports that symptom onset was on 02/14/2019 or earlier.  Patient is unsure.  Judging by chart review patient completed a televisit on 02/14/2019 with her PCP Dr. Nicki Reaper where she reported runny nose and nasal drainage.  She was exposed from a caregiver.  Her initial rapid test was negative.  Patient tested positive for Covid on 02/22/2019. Given symptom onset she would not qualify for the monoclonal antibody infusion.   Patient has having ongoing coughing fits on the call.'s difficult to fully communicate with her due to this.  She had another televisit with her primary care team today regarding this.  I will route a message to both Dr. Nicki Reaper as well as Ms. Mable Paris, FNP.   I agree with the cough suppression that was prescribed today.  I think that that is a good start.  I also do not believe would be unreasonable to consider x-ray imaging.  Patient is specifically reporting an inspiratory cough.  There is a concern that she may have interstitial lung disease changes based off of Covid.  The first step would be obtaining chest x-ray imaging.  If symptoms persist also not unreasonable to refer to pulmonary due to COVID-19 and ongoing cough.  Where she could obtain pulmonary function testing as well as additional imaging.  If you are referring to [facility] Dr. Chase Caller or Dr. Vaughan Browner would be the 2 providers I would recommend as they are specializing in post Covid pulmonary care.  We also have [facility] in [place] - Dr. Mortimer Fries or Dr. Patsey Berthold.   Given patient's location in [place], [place] is also not unreasonable to refer to [facility] or [facility] based off of logistics.    Wyn Quaker FNP

## 2019-02-28 NOTE — Telephone Encounter (Signed)
Has 9 am virtual with Arnett.

## 2019-02-28 NOTE — Telephone Encounter (Signed)
Dr Nicki Reaper,  I sent in prednisone for Ms Heiges. I ordered CXR for her as well  Now.. it looks like Ms Jeffie Pollock would like CXR. Not sure what you discussed with her.Marland KitchenMarland Kitchen

## 2019-02-28 NOTE — Assessment & Plan Note (Addendum)
Patient coughed a lot over video.  She was not labored her speech and I was pleased by her oxygen saturation.  At this time, she does appear to be stable to be at home however I want  her to start using an inhaler around the clock and she also may use cough syrup as needed primarily at bedtime.  Unfortunately she is outside of the window for monoclonal antibodies.  I have advised if she become short of breath, labored her speech her oxygen falls below 90%, she is to go the nearest emergency room.  Patient verbalized understanding.  She will remain under quarantine.  COVID home monitoring ordered. Pending chest x-ray and close follow-up ( f/u sch 2/9 with Dr Nicki Reaper) at this time.

## 2019-02-28 NOTE — Telephone Encounter (Signed)
Thank you Aaron Edelman for being so quick and ALL the advice below.   Im ordering CXR and will speak with patient about pulmonology referral.   THANK YOU again.

## 2019-02-28 NOTE — Telephone Encounter (Signed)
Pt mom called if pt will need a Rx to sent to [facility] @ [phone].  Call Vernard Gambles if it will be called in @ [phone].

## 2019-02-28 NOTE — Telephone Encounter (Signed)
Called and spoke to Pt mother. Pt mother wanted to know if Dr.Scott will be sending a steroid pack in for the Pt. Ot would like it sent to [facility].

## 2019-02-28 NOTE — Telephone Encounter (Signed)
I ordered for [facility].  Couldn't she just walk in?

## 2019-02-28 NOTE — Telephone Encounter (Signed)
Call patient I received word from infusion clinic Wyn Quaker, NP the patient was not a candidate for monoclonal antibodies based on symptom onset.  I will do a better job of triaging this and you were right.   He did however recommend what I think is a good idea- chest x-ray.  Have ordered this.  Would you speak with patient about the chest x-ray and see if she is able to get over to [facility]?    Please also make a close follow-up appointment with myself or Dr. Nicki Reaper in one  Week. .  Advised her to certainly call us with any concerns prior to her appointment.

## 2019-02-28 NOTE — Telephone Encounter (Signed)
I have spoke with patient & mom to let her know that patient has been sent in prednisone taper. They will be going tomorrow for chest xray.

## 2019-02-28 NOTE — Telephone Encounter (Signed)
Yes she can but must notify on arrival COVID positive.

## 2019-02-28 NOTE — Telephone Encounter (Signed)
Any time happy to help.  I agree with the cough suppression I think that would be a great start.  Not unreasonable though does go ahead and order the x-ray to ensure that that is clear.Pulmonary referral is always there if the patient would like or if you guys believe is clinically appropriate.Aaron Edelman

## 2019-02-28 NOTE — Progress Notes (Signed)
Virtual Visit via Video Note  I connected with@  on 02/28/19 at  9:00 AM EST by a video enabled telemedicine application and verified that I am speaking with the correct person using two identifiers.  Location patient: home Location provider:work  Persons participating in the virtual visit: patient, provider  I discussed the limitations of evaluation and management by telemedicine and the availability of in person appointments. The patient expressed understanding and agreed to proceed.   HPI: Complains of productive cough, worsening.  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 No SOB, wheezing,fever, diarrhea  She has sa02 at home, reading  93-95% when still or walking.    Taking delsym with control of cough for 2 hours.  Over the weekend, bilious vomiting episodes, since stopped 2 days ago. Continues to feel nauseated. Drinking water.  Urine is pale yellow.    Seen by PCP January 18 for close exposure to Covid.  Tested + 02/08/2019  ROS: See pertinent positives and negatives per HPI.  Past Medical History:  Diagnosis Date  . Allergy   . Basal cell carcinoma   . GERD (gastroesophageal reflux disease)   . Hyperthyroidism   . Pyelonephritis 1985    Past Surgical History:  Procedure Laterality Date  . DILATION AND CURETTAGE OF UTERUS    . Miscarriage     x1  . TONSILLECTOMY  1963    Family History  Problem Relation Age of Onset  . Hypertension Mother   . Hypercholesterolemia Mother   . Heart disease Father   . Hyperlipidemia Father   . Hypercholesterolemia Father     SOCIAL HX: never smoker   Current Outpatient Medications:  .  ALPRAZolam (XANAX) 0.5 MG tablet, Take by mouth., Disp: , Rfl:  .  Calcium Citrate-Vitamin D (CITRACAL + D PO), Take by mouth. Take one in the morning & 2 in the afternoon, Disp: , Rfl:  .  DULoxetine HCl 40 MG CPEP, TAKE 1 ORAL CAPSULE DAILY, Disp: ,  Rfl: 1 .  ibuprofen (ADVIL,MOTRIN) 600 MG tablet, Take 600 mg by mouth as needed for pain., Disp: , Rfl:  .  ipratropium (ATROVENT) 0.03 % nasal spray, PLACE 2 SPRAYS INTO BOTH NOSTRILS EVERY 12 (TWELVE) HOURS., Disp: 16 mL, Rfl: 1 .  levothyroxine (SYNTHROID) 75 MCG tablet, TAKE 1 TABLET (75 MCG TOTAL) BY MOUTH DAILY BEFORE BREAKFAST., Disp: 90 tablet, Rfl: 1 .  Multiple Vitamin (MULTIVITAMIN) tablet, Take 1 tablet by mouth daily., Disp: , Rfl:  .  valACYclovir (VALTREX) 500 MG tablet, Take one to two tablets by mouth daily, Disp: , Rfl:  .  albuterol (VENTOLIN HFA) 108 (90 Base) MCG/ACT inhaler, Inhale 2 puffs into the lungs every 6 (six) hours as needed for wheezing or shortness of breath., Disp: 6.7 g, Rfl: 1 .  HYDROcodone-homatropine (HYCODAN) 5-1.5 MG/5ML syrup, Take 5 mLs by mouth at bedtime as needed for cough., Disp: 45 mL, Rfl: 0 .  ondansetron (ZOFRAN ODT) 4 MG disintegrating tablet, Take 1 tablet (4 mg total) by mouth every 8 (eight) hours as needed for nausea or vomiting., Disp: 20 tablet, Rfl: 0  EXAM:  VITALS per patient if applicable:  GENERAL: alert, oriented, appears well and in no acute distress  HEENT: atraumatic, conjunttiva clear, no obvious abnormalities on inspection of external nose and ears  NECK: normal movements of the head and neck  LUNGS: on inspection no signs of respiratory distress,  breathing rate appears normal, no obvious gross SOB, gasping or wheezing  CV: no obvious cyanosis.  Respiratory: not labored in speech when talking or walking in house. Persistent cough noted.   MS: moves all visible extremities without noticeable abnormality  PSYCH/NEURO: pleasant and cooperative, no obvious depression or anxiety, speech and thought processing grossly intact  ASSESSMENT AND PLAN:  Discussed the following assessment and plan:  COVID-19 - Plan: MyChart COVID-19 home monitoring program, Temperature monitoring  Close exposure to COVID-19 virus - Plan:  HYDROcodone-homatropine (HYCODAN) 5-1.5 MG/5ML syrup, albuterol (VENTOLIN HFA) 108 (90 Base) MCG/ACT inhaler, ondansetron (ZOFRAN ODT) 4 MG disintegrating tablet, DG Chest 2 View Problem List Items Addressed This Visit      Other   COVID-19 - Primary    Patient coughed a lot over video.  She was not labored her speech and I was pleased by her oxygen saturation.  At this time, she does appear to be stable to be at home however I want  her to start using an inhaler around the clock and she also may use cough syrup as needed primarily at bedtime.  Unfortunately she is outside of the window for monoclonal antibodies.  I have advised if she become short of breath, labored her speech her oxygen falls below 90%, she is to go the nearest emergency room.  Patient verbalized understanding.  She will remain under quarantine.  COVID home monitoring ordered. Pending chest x-ray and close follow-up ( f/u sch 2/9 with Dr Nicki Reaper) at this time.      Relevant Medications   HYDROcodone-homatropine (HYCODAN) 5-1.5 MG/5ML syrup   albuterol (VENTOLIN HFA) 108 (90 Base) MCG/ACT inhaler   ondansetron (ZOFRAN ODT) 4 MG disintegrating tablet   Other Relevant Orders   MyChart COVID-19 home monitoring program   Temperature monitoring      -we discussed possible serious and likely etiologies, options for evaluation and workup, limitations of telemedicine visit vs in person visit, treatment, treatment risks and precautions. Pt prefers to treat via telemedicine empirically rather then risking or undertaking an in person visit at this moment. Patient agrees to seek prompt in person care if worsening, new symptoms arise, or if is not improving with treatment.   I discussed the assessment and treatment plan with the patient. The patient was provided an opportunity to ask questions and all were answered. The patient agreed with the plan and demonstrated an understanding of the instructions.   The patient was advised to call back  or seek an in-person evaluation if the symptoms worsen or if the condition fails to improve as anticipated.   Mable Paris, FNP

## 2019-02-28 NOTE — Telephone Encounter (Signed)
Please call.  With symptoms - needs to be seen as we discussed.

## 2019-02-28 NOTE — Telephone Encounter (Signed)
Called Pt back to tell her X-Ray orders has been put in.

## 2019-03-01 ENCOUNTER — Ambulatory Visit
Admission: RE | Admit: 2019-03-01 | Discharge: 2019-03-01 | Disposition: A | Discharge status: Home / Self Care | Payer: No Typology Code available for payment source | Source: Ambulatory Visit | Attending: Family | Admitting: Family

## 2019-03-01 DIAGNOSIS — Z20822 Contact with and (suspected) exposure to covid-19: Secondary | ICD-10-CM | POA: Diagnosis not present

## 2019-03-01 DIAGNOSIS — R05 Cough: Secondary | ICD-10-CM | POA: Diagnosis not present

## 2019-03-02 ENCOUNTER — Telehealth: Payer: Self-pay | Admitting: Internal Medicine

## 2019-03-02 ENCOUNTER — Encounter: Payer: Self-pay | Admitting: Family

## 2019-03-02 ENCOUNTER — Other Ambulatory Visit: Payer: Self-pay | Admitting: Family

## 2019-03-02 DIAGNOSIS — U071 COVID-19: Secondary | ICD-10-CM

## 2019-03-02 MED ORDER — DOXYCYCLINE HYCLATE 100 MG PO TABS: 100.0000 mg | ORAL_TABLET | Freq: Two times a day (BID) | ORAL | 0 refills | Status: DC

## 2019-03-02 NOTE — Telephone Encounter (Signed)
Pt was returning call to [place]

## 2019-03-02 NOTE — Telephone Encounter (Signed)
LMTCB

## 2019-03-02 NOTE — Telephone Encounter (Signed)
Pt called back about wanting to see if something else can be done for the coughing. Pt states the study investigator for monoclono antibody was going to call the ofc to give two name of a pulmonologist that the pt can see in his ofc. Please advise and Thank you!  Call pt @ [phone].

## 2019-03-03 MED ORDER — HYDROCODONE-HOMATROPINE 5-1.5 MG/5ML PO SYRP: 5.0000 mL | ORAL_SOLUTION | Freq: Two times a day (BID) | ORAL | 0 refills | Status: DC | PRN

## 2019-03-03 NOTE — Telephone Encounter (Signed)
rx sent in for cough syrup to [facility].  I can place order for referral to pulmonary if she desires.  Joycelyn Schmid was talking to her about this.

## 2019-03-03 NOTE — Telephone Encounter (Signed)
Patients mother is aware. Pt was resting when I called but will discuss pulmonary referral with pt

## 2019-03-03 NOTE — Addendum Note (Signed)
Addended by: Alisa Graff on: 03/03/2019 03:40 PM   Modules accepted: Orders

## 2019-03-03 NOTE — Telephone Encounter (Signed)
Spoke with patient. Requesting to increase cough syrup to BID prn. Using delsym during the day with minimal relief. Does feel like prednisone is helping some. Going to start abx today. Stated she was doing ok overall but cannot even have a conversation without coughing. No SOB. She is requesting a refill on rx cough syrup be sent to [facility]. Her aunt is going to pick up this afternoon. Advised I would call once sent in. There is a note in her chart that looks like it was routed to [facility] from Wyn Quaker, NP regarding which pulmonologists are recommended.

## 2019-03-03 NOTE — Telephone Encounter (Signed)
See other note

## 2019-03-04 ENCOUNTER — Telehealth: Payer: Self-pay | Admitting: Internal Medicine

## 2019-03-04 NOTE — Telephone Encounter (Signed)
Patient was exposed on 1/18. Tested positive on 03/06/2022. Father passed away this morning. They are supposed to go to the funeral home on Sunday for private viewing and then funeral on Monday.

## 2019-03-04 NOTE — Telephone Encounter (Signed)
Pt would like Dr Nicki Reaper to give the ok to go to funeral home to view father on Sunday and funeral on Monday. Pt states that her and her mother are still sick but funeral home needs to know if they are still contagious. Pt would like a call back today. Please advise.

## 2019-03-04 NOTE — Telephone Encounter (Signed)
It is recommended to follow quarantine guidelines - to keep from infected other people.

## 2019-03-04 NOTE — Telephone Encounter (Signed)
Spoke with Amber Richardson. She is feeling better today than yesterday. Cough is some better. No fever at all. She has taken 2 doses of her abx. She stated that they are wanting to do the funeral on Monday due to bad weather next week. Her quarantine date initially is supposed to be 03/08/19. Advised of message below and pt asked if I would just give you the update and see if that makes a difference. Stated they really need to be able to get everything arranged before the bad weather.

## 2019-03-04 NOTE — Telephone Encounter (Signed)
If she is still having fever or increased cough, would recommend to continue to quarantine.

## 2019-03-04 NOTE — Telephone Encounter (Signed)
Pt advised of below

## 2019-03-07 ENCOUNTER — Ambulatory Visit (INDEPENDENT_AMBULATORY_CARE_PROVIDER_SITE_OTHER): Payer: No Typology Code available for payment source | Admitting: Internal Medicine

## 2019-03-07 ENCOUNTER — Other Ambulatory Visit: Payer: Self-pay

## 2019-03-07 DIAGNOSIS — U071 COVID-19: Secondary | ICD-10-CM

## 2019-03-07 DIAGNOSIS — R69 Illness, unspecified: Secondary | ICD-10-CM | POA: Diagnosis not present

## 2019-03-07 DIAGNOSIS — F439 Reaction to severe stress, unspecified: Secondary | ICD-10-CM | POA: Diagnosis not present

## 2019-03-07 MED ORDER — FLUTICASONE PROPIONATE 50 MCG/ACT NA SUSP: 2.0000 | Freq: Every day | NASAL | 1 refills | Status: DC

## 2019-03-07 MED ORDER — DOXYCYCLINE HYCLATE 100 MG PO TABS: 100.0000 mg | ORAL_TABLET | Freq: Two times a day (BID) | ORAL | 0 refills | Status: DC

## 2019-03-07 NOTE — Progress Notes (Signed)
Patient ID: Amber Richardson, female   DOB: Apr 06, 1955, 64 y.o.   MRN: [id number]   Virtual Visit via video Note  This visit type was conducted due to national recommendations for restrictions regarding the COVID-19 pandemic (e.g. social distancing).  This format is felt to be most appropriate for this patient at this time.  All issues noted in this document were discussed and addressed.  No physical exam was performed (except for noted visual exam findings with Video Visits).   I connected with Amber Richardson by a video enabled telemedicine application and verified that I am speaking with the correct person using two identifiers. Location patient: home Location provider: work  Persons participating in the virtual visit: patient, provider  The limitations, risks, security and privacy concerns of performing an evaluation and management service by video and the availability of in person appointments have been discussed.  The patient expressed understanding and agreed to proceed.   Reason for visit: work in appt  HPI: Tested positive for covid 02/21/19.  CXR - bilateral faint areas of opacity in both lungs - suspicious for viral pneumonia.  Had been having increased cough and congestion.  Being treated with doxycycline and prednisone (02/28/19).  She is doing better.  Able to talk now with decreased cough.  Has developed some sinus congestion with some yellow mucus production.  Some drainage.  Still with some cough, but has improved.  No chest tightness.  Upper back hurts and lower sternum discomfort.  Oxygen saturation 96-97%.  She is eating.  Only had 5 days of doxycycline.  Request to extend.  Discussed flonase.  No vomiting and diarrhea.  Will need FMLA paperwork completed.  Discussed f/u with pulmonary.  Referral has already been placed.  Increased stress with her ongoing medical issues and her father just recently passed away.  Mother diagnosed with covid as well.     ROS: See pertinent positives  and negatives per HPI.  Past Medical History:  Diagnosis Date  . Allergy   . Basal cell carcinoma   . GERD (gastroesophageal reflux disease)   . Hyperthyroidism   . Pyelonephritis 1985    Past Surgical History:  Procedure Laterality Date  . DILATION AND CURETTAGE OF UTERUS    . Miscarriage     x1  . TONSILLECTOMY  1963    Family History  Problem Relation Age of Onset  . Hypertension Mother   . Hypercholesterolemia Mother   . Heart disease Father   . Hyperlipidemia Father   . Hypercholesterolemia Father     SOCIAL HX: reviewed.    Current Outpatient Medications:  .  albuterol (VENTOLIN HFA) 108 (90 Base) MCG/ACT inhaler, Inhale 2 puffs into the lungs every 6 (six) hours as needed for wheezing or shortness of breath., Disp: 6.7 g, Rfl: 1 .  ALPRAZolam (XANAX) 0.5 MG tablet, Take by mouth., Disp: , Rfl:  .  Calcium Citrate-Vitamin D (CITRACAL + D PO), Take by mouth. Take one in the morning & 2 in the afternoon, Disp: , Rfl:  .  doxycycline (VIBRA-TABS) 100 MG tablet, Take 1 tablet (100 mg total) by mouth 2 (two) times daily., Disp: 10 tablet, Rfl: 0 .  DULoxetine HCl 40 MG CPEP, TAKE 1 ORAL CAPSULE DAILY, Disp: , Rfl: 1 .  fluticasone (FLONASE) 50 MCG/ACT nasal spray, Place 2 sprays into both nostrils daily., Disp: 16 g, Rfl: 1 .  HYDROcodone-homatropine (HYCODAN) 5-1.5 MG/5ML syrup, Take 5 mLs by mouth 2 (two) times daily as needed for cough., Disp:  120 mL, Rfl: 0 .  ibuprofen (ADVIL,MOTRIN) 600 MG tablet, Take 600 mg by mouth as needed for pain., Disp: , Rfl:  .  ipratropium (ATROVENT) 0.03 % nasal spray, PLACE 2 SPRAYS INTO BOTH NOSTRILS EVERY 12 (TWELVE) HOURS., Disp: 16 mL, Rfl: 1 .  levothyroxine (SYNTHROID) 75 MCG tablet, TAKE 1 TABLET (75 MCG TOTAL) BY MOUTH DAILY BEFORE BREAKFAST., Disp: 90 tablet, Rfl: 1 .  Multiple Vitamin (MULTIVITAMIN) tablet, Take 1 tablet by mouth daily., Disp: , Rfl:  .  ondansetron (ZOFRAN ODT) 4 MG disintegrating tablet, Take 1 tablet (4 mg  total) by mouth every 8 (eight) hours as needed for nausea or vomiting., Disp: 20 tablet, Rfl: 0 .  predniSONE (DELTASONE) 10 MG tablet, Take 4 tablets x 1 day and then decrease by 1/2 tablet per day until down to zero mg., Disp: 18 tablet, Rfl: 0 .  valACYclovir (VALTREX) 500 MG tablet, Take one to two tablets by mouth daily, Disp: , Rfl:   EXAM:  VITALS per patient if applicable: 123456  GENERAL: alert, oriented, appears well and in no acute distress  HEENT: atraumatic, conjunttiva clear, no obvious abnormalities on inspection of external nose and ears  NECK: normal movements of the head and neck  LUNGS: on inspection no signs of respiratory distress, breathing rate appears normal, no obvious gross SOB, gasping or wheezing.  Able to talk without increased cough.   CV: no obvious cyanosis  PSYCH/NEURO: pleasant and cooperative, no obvious depression or anxiety, speech and thought processing grossly intact  ASSESSMENT AND PLAN:  Discussed the following assessment and plan:  COVID-19 Recently diagnosed.  Treated with prednisone and doxycycline.  Will extend out doxycycline for 5 more days. Flonase nasal spray and mucinex as directed. Rest.  Fluids.  CXR as outlined.  Referral already in place for f/u with pumonary.  Oxygen saturation 96-97%.  Follow closely.  Will need to remain out of work for now.  Schedule f/u appt to reassess.   Stress Increased stress as outlined.  Does not feel needs anything more at this time.  Sees psychiatry.  Follow.     Meds ordered this encounter  Medications  . doxycycline (VIBRA-TABS) 100 MG tablet    Sig: Take 1 tablet (100 mg total) by mouth 2 (two) times daily.    Dispense:  10 tablet    Refill:  0  . fluticasone (FLONASE) 50 MCG/ACT nasal spray    Sig: Place 2 sprays into both nostrils daily.    Dispense:  16 g    Refill:  1     I discussed the assessment and treatment plan with the patient. The patient was provided an opportunity to ask  questions and all were answered. The patient agreed with the plan and demonstrated an understanding of the instructions.   The patient was advised to call back or seek an in-person evaluation if the symptoms worsen or if the condition fails to improve as anticipated.   Einar Pheasant, MD

## 2019-03-11 ENCOUNTER — Other Ambulatory Visit: Payer: Self-pay | Admitting: Family

## 2019-03-11 ENCOUNTER — Encounter: Payer: Self-pay | Admitting: Internal Medicine

## 2019-03-11 DIAGNOSIS — U071 COVID-19: Secondary | ICD-10-CM

## 2019-03-11 NOTE — Telephone Encounter (Signed)
Pt called to see if we received her FMLA paperwork. I told her we did. She wants this done ASAP. I told her Dr. Nicki Reaper was not in the office today

## 2019-03-13 ENCOUNTER — Encounter: Payer: Self-pay | Admitting: Internal Medicine

## 2019-03-13 NOTE — Assessment & Plan Note (Signed)
Increased stress as outlined.  Does not feel needs anything more at this time.  Sees psychiatry.  Follow.

## 2019-03-13 NOTE — Assessment & Plan Note (Signed)
Recently diagnosed.  Treated with prednisone and doxycycline.  Will extend out doxycycline for 5 more days. Flonase nasal spray and mucinex as directed. Rest.  Fluids.  CXR as outlined.  Referral already in place for f/u with pumonary.  Oxygen saturation 96-97%.  Follow closely.  Will need to remain out of work for now.  Schedule f/u appt to reassess.

## 2019-03-14 ENCOUNTER — Telehealth: Payer: Self-pay | Admitting: Internal Medicine

## 2019-03-14 ENCOUNTER — Encounter: Payer: Self-pay | Admitting: Family

## 2019-03-14 NOTE — Telephone Encounter (Signed)
Forms are being worked on. Will update through my chart

## 2019-03-14 NOTE — Telephone Encounter (Signed)
Pt wanted to make sure that her work forms are filled out. She sent them through [employer]. Just FYI.

## 2019-03-15 NOTE — Telephone Encounter (Signed)
Will complete and place in box.

## 2019-03-15 NOTE — Telephone Encounter (Signed)
See other note

## 2019-03-16 ENCOUNTER — Other Ambulatory Visit: Payer: 59

## 2019-03-16 NOTE — Telephone Encounter (Signed)
Paperwork faxed °

## 2019-03-17 ENCOUNTER — Ambulatory Visit (INDEPENDENT_AMBULATORY_CARE_PROVIDER_SITE_OTHER): Payer: 59 | Admitting: Internal Medicine

## 2019-03-17 ENCOUNTER — Encounter: Payer: Self-pay | Admitting: Internal Medicine

## 2019-03-17 ENCOUNTER — Other Ambulatory Visit: Payer: Self-pay

## 2019-03-17 DIAGNOSIS — R69 Illness, unspecified: Secondary | ICD-10-CM | POA: Diagnosis not present

## 2019-03-17 DIAGNOSIS — R5383 Other fatigue: Secondary | ICD-10-CM

## 2019-03-17 DIAGNOSIS — F439 Reaction to severe stress, unspecified: Secondary | ICD-10-CM | POA: Diagnosis not present

## 2019-03-17 DIAGNOSIS — U071 COVID-19: Secondary | ICD-10-CM | POA: Diagnosis not present

## 2019-03-17 MED ORDER — IPRATROPIUM BROMIDE 0.03 % NA SOLN: 2.0000 | Freq: Two times a day (BID) | NASAL | 1 refills | Status: DC

## 2019-03-17 NOTE — Progress Notes (Signed)
Patient ID: Amber Richardson, female   DOB: 06-01-1955, 64 y.o.   MRN: [id number]   Virtual Visit via video Note  This visit type was conducted due to national recommendations for restrictions regarding the COVID-19 pandemic (e.g. social distancing).  This format is felt to be most appropriate for this patient at this time.  All issues noted in this document were discussed and addressed.  No physical exam was performed (except for noted visual exam findings with Video Visits).   I connected with Amber Richardson by a video enabled telemedicine application and verified that I am speaking with the correct person using two identifiers. Location patient: home Location provider: work  Persons participating in the virtual visit: patient, provider  The limitations, risks, security and privacy concerns of performing an evaluation and management service by video and the availability of in person appointments have been discussed.  The patient expressed understanding and agreed to proceed.   Reason for visit: scheduled follow up.   HPI: Diagnosed with covid 02/21/19.  cxr with faint opacities.  Was treated with doxycycline and prednisone.  Also has inhaler.  Previous - increased cough, congestion and sinus congestion.  Is doing better.  Cough is much better.  Able to talk without coughing.  Still with runny nose and some stuffiness.  States if she moves around a lot/working/cleaning out - will notice being more winded.  Overall chest congestion and breathing improved.  Eating.  No nausea or vomiting.  No diarrhea.  Does request to have something to help with runny nose.  Increased stress.  Staying with her mother now.  Trying to help get things cleaned out.  Trying to cope with her father's recent death.  Sees psychiatry.     ROS: See pertinent positives and negatives per HPI.  Past Medical History:  Diagnosis Date  . Allergy   . Basal cell carcinoma   . GERD (gastroesophageal reflux disease)   .  Hyperthyroidism   . Pyelonephritis 1985    Past Surgical History:  Procedure Laterality Date  . DILATION AND CURETTAGE OF UTERUS    . Miscarriage     x1  . TONSILLECTOMY  1963    Family History  Problem Relation Age of Onset  . Hypertension Mother   . Hypercholesterolemia Mother   . Heart disease Father   . Hyperlipidemia Father   . Hypercholesterolemia Father     SOCIAL HX: reviewed.    Current Outpatient Medications:  .  albuterol (VENTOLIN HFA) 108 (90 Base) MCG/ACT inhaler, Inhale 2 puffs into the lungs every 6 (six) hours as needed for wheezing or shortness of breath., Disp: 6.7 g, Rfl: 1 .  ALPRAZolam (XANAX) 0.5 MG tablet, Take by mouth., Disp: , Rfl:  .  Calcium Citrate-Vitamin D (CITRACAL + D PO), Take by mouth. Take one in the morning & 2 in the afternoon, Disp: , Rfl:  .  doxycycline (VIBRA-TABS) 100 MG tablet, Take 1 tablet (100 mg total) by mouth 2 (two) times daily., Disp: 10 tablet, Rfl: 0 .  DULoxetine HCl 40 MG CPEP, TAKE 1 ORAL CAPSULE DAILY, Disp: , Rfl: 1 .  fluticasone (FLONASE) 50 MCG/ACT nasal spray, Place 2 sprays into both nostrils daily., Disp: 16 g, Rfl: 1 .  HYDROcodone-homatropine (HYCODAN) 5-1.5 MG/5ML syrup, Take 5 mLs by mouth 2 (two) times daily as needed for cough., Disp: 120 mL, Rfl: 0 .  ibuprofen (ADVIL,MOTRIN) 600 MG tablet, Take 600 mg by mouth as needed for pain., Disp: , Rfl:  .  ipratropium (ATROVENT) 0.03 % nasal spray, Place 2 sprays into both nostrils every 12 (twelve) hours., Disp: 16 mL, Rfl: 1 .  levothyroxine (SYNTHROID) 75 MCG tablet, TAKE 1 TABLET (75 MCG TOTAL) BY MOUTH DAILY BEFORE BREAKFAST., Disp: 90 tablet, Rfl: 1 .  Multiple Vitamin (MULTIVITAMIN) tablet, Take 1 tablet by mouth daily., Disp: , Rfl:  .  ondansetron (ZOFRAN ODT) 4 MG disintegrating tablet, Take 1 tablet (4 mg total) by mouth every 8 (eight) hours as needed for nausea or vomiting., Disp: 20 tablet, Rfl: 0 .  predniSONE (DELTASONE) 10 MG tablet, Take 4 tablets  x 1 day and then decrease by 1/2 tablet per day until down to zero mg., Disp: 18 tablet, Rfl: 0 .  valACYclovir (VALTREX) 500 MG tablet, Take one to two tablets by mouth daily, Disp: , Rfl:   EXAM:  GENERAL: alert, oriented, appears well and in no acute distress  HEENT: atraumatic, conjunttiva clear, no obvious abnormalities on inspection of external nose and ears  NECK: normal movements of the head and neck  LUNGS: on inspection no signs of respiratory distress, breathing rate appears normal, no obvious gross SOB, gasping or wheezing  CV: no obvious cyanosis  PSYCH/NEURO: pleasant and cooperative, no obvious depression or anxiety, speech and thought processing grossly intact  ASSESSMENT AND PLAN:  Discussed the following assessment and plan:  COVID-19 Recently diagnosed as outlined.  Treated with rescue inhaler, prednisone and doxycycline.  Also using saline nasal spray and flonase.  Symptoms better.  Breathing better.  Cough and congestion better.  Still with runny nose.  Trial of atrovent nasal spray.  Continue robitussin DM if needed.  Rest.  Stay hydrated.  Has referral in to pulmonary for f/u.  Will need f/u cxr to confirm clearance.    Fatigue Still with fatigue, but has improved.  Follow.  Discussed may take time to get back to her normal.   Stress Increased stress as outlined.  Sees psychiatry.  Does not feel needs any further intervention at this time.  Follow.     Meds ordered this encounter  Medications  . ipratropium (ATROVENT) 0.03 % nasal spray    Sig: Place 2 sprays into both nostrils every 12 (twelve) hours.    Dispense:  16 mL    Refill:  1     I discussed the assessment and treatment plan with the patient. The patient was provided an opportunity to ask questions and all were answered. The patient agreed with the plan and demonstrated an understanding of the instructions.   The patient was advised to call back or seek an in-person evaluation if the symptoms  worsen or if the condition fails to improve as anticipated.   Einar Pheasant, MD

## 2019-03-22 ENCOUNTER — Encounter: Payer: Self-pay | Admitting: Internal Medicine

## 2019-03-22 NOTE — Assessment & Plan Note (Signed)
Recently diagnosed as outlined.  Treated with rescue inhaler, prednisone and doxycycline.  Also using saline nasal spray and flonase.  Symptoms better.  Breathing better.  Cough and congestion better.  Still with runny nose.  Trial of atrovent nasal spray.  Continue robitussin DM if needed.  Rest.  Stay hydrated.  Has referral in to pulmonary for f/u.  Will need f/u cxr to confirm clearance.

## 2019-03-22 NOTE — Assessment & Plan Note (Signed)
Still with fatigue, but has improved.  Follow.  Discussed may take time to get back to her normal.

## 2019-03-22 NOTE — Assessment & Plan Note (Signed)
Increased stress as outlined.  Sees psychiatry.  Does not feel needs any further intervention at this time.  Follow.

## 2019-03-23 ENCOUNTER — Telehealth: Payer: Self-pay

## 2019-03-23 ENCOUNTER — Telehealth: Payer: Self-pay | Admitting: Internal Medicine

## 2019-03-23 NOTE — Telephone Encounter (Signed)
Can sign form.  Just need to clarify - what date - why extension.

## 2019-03-23 NOTE — Telephone Encounter (Signed)
Pt has been scheduled for a consult with Dr. Melvyn Novas 3/8. Pt was positive for covid 1/27.   Attempted to call pt but unable to reach. Left message for pt to return call.

## 2019-03-23 NOTE — Telephone Encounter (Signed)
Patient called stating she needs an extension on her LOA paperwork from work with new suspected return date based off of exam 03/17/19. I have completed the form and asked Rasheedah to call and follow up on pulmonary folder. Form needs to be faxed before 3/1. Placed in your quick sign.

## 2019-03-24 ENCOUNTER — Institutional Professional Consult (permissible substitution): Payer: 59 | Admitting: Pulmonary Disease

## 2019-03-24 NOTE — Telephone Encounter (Signed)
Extension is for SOB on exertion- unable to work for 12 hours and wear mask etc. She is asking for 2 more weeks out of work. I have her scheduled to see pulmonary 3/8 at 3:45

## 2019-03-24 NOTE — Telephone Encounter (Signed)
Spoke with Larena Glassman at [employer]. Advised her that since the pt is more than 21 days out from her positive result, she would be find to come in to the office for her consult. Nothing further was needed.

## 2019-03-24 NOTE — Telephone Encounter (Signed)
Signed and placed in box.   

## 2019-03-25 NOTE — Telephone Encounter (Signed)
Paperwork faxed and patient is aware 

## 2019-03-31 DIAGNOSIS — Z634 Disappearance and death of family member: Secondary | ICD-10-CM | POA: Diagnosis not present

## 2019-03-31 DIAGNOSIS — Z598 Other problems related to housing and economic circumstances: Secondary | ICD-10-CM | POA: Diagnosis not present

## 2019-03-31 DIAGNOSIS — Z730 Burn-out: Secondary | ICD-10-CM | POA: Diagnosis not present

## 2019-03-31 DIAGNOSIS — R69 Illness, unspecified: Secondary | ICD-10-CM | POA: Diagnosis not present

## 2019-03-31 DIAGNOSIS — Z566 Other physical and mental strain related to work: Secondary | ICD-10-CM | POA: Diagnosis not present

## 2019-04-01 ENCOUNTER — Telehealth: Payer: Self-pay | Admitting: Internal Medicine

## 2019-04-01 NOTE — Telephone Encounter (Signed)
Amber Richardson [phone] called they are needing the following information The date of service after 02/21/19 for Covid  And if pt tested positive for Covid   The claim number is [id number]

## 2019-04-01 NOTE — Telephone Encounter (Signed)
Pt said she doesn't need a call back but she has opened up a disability claim with [employer] and they will be sending over paperwork. She said it is different from what she filled out recently.

## 2019-04-04 ENCOUNTER — Institutional Professional Consult (permissible substitution): Payer: 59 | Admitting: Internal Medicine

## 2019-04-05 NOTE — Telephone Encounter (Signed)
Spoke with [employer] this morning and gave information needed below.

## 2019-04-05 NOTE — Telephone Encounter (Signed)
Pt dropped off [employer] Disability form to be filled out  Placed in Dr. Bary Leriche color folder upfront

## 2019-04-07 ENCOUNTER — Other Ambulatory Visit: Payer: Self-pay

## 2019-04-07 ENCOUNTER — Encounter: Payer: Self-pay | Admitting: Critical Care Medicine

## 2019-04-07 ENCOUNTER — Ambulatory Visit (INDEPENDENT_AMBULATORY_CARE_PROVIDER_SITE_OTHER): Payer: 59 | Admitting: Critical Care Medicine

## 2019-04-07 ENCOUNTER — Ambulatory Visit (INDEPENDENT_AMBULATORY_CARE_PROVIDER_SITE_OTHER): Payer: 59

## 2019-04-07 VITALS — BP 130/80 | HR 66 | Temp 98.2°F | Ht 68.0 in | Wt 157.8 lb

## 2019-04-07 DIAGNOSIS — R05 Cough: Secondary | ICD-10-CM | POA: Diagnosis not present

## 2019-04-07 DIAGNOSIS — R0602 Shortness of breath: Secondary | ICD-10-CM | POA: Diagnosis not present

## 2019-04-07 DIAGNOSIS — Z8616 Personal history of COVID-19: Secondary | ICD-10-CM

## 2019-04-07 MED ORDER — FLUTTER DEVI: 1.0000 | Freq: Two times a day (BID) | 0 refills | Status: DC

## 2019-04-07 MED ORDER — FLUTICASONE-SALMETEROL 250-50 MCG/DOSE IN AEPB: 1.0000 | INHALATION_SPRAY | Freq: Two times a day (BID) | RESPIRATORY_TRACT | 5 refills | Status: DC

## 2019-04-07 NOTE — Telephone Encounter (Signed)
Paper work received and will notify patient once completed

## 2019-04-07 NOTE — Patient Instructions (Addendum)
Thank you for visiting Dr. Carlis Abbott at [facility]. We recommend the following: Orders Placed This Encounter  Procedures  . CT Chest High Resolution  . DG Chest 2 View  . Pulmonary function test   Orders Placed This Encounter  Procedures  . CT Chest High Resolution    Early April    Standing Status:   Future    Standing Expiration Date:   06/06/2020    Order Specific Question:   ** REASON FOR EXAM (FREE TEXT)    Answer:   post-covid    Order Specific Question:   Preferred imaging location?    Answer:   [facility]    Order Specific Question:   Radiology Contrast Protocol - do NOT remove file path    Answer:   \\[email or web address]  . DG Chest 2 View    Standing Status:   Future    Number of Occurrences:   1    Standing Expiration Date:   06/06/2020    Order Specific Question:   Reason for Exam (SYMPTOM  OR DIAGNOSIS REQUIRED)    Answer:   sob    Order Specific Question:   Preferred imaging location?    Answer:   [facility]    Order Specific Question:   Radiology Contrast Protocol - do NOT remove file path    Answer:   \\[email or web address]  . Pulmonary function test    Standing Status:   Future    Standing Expiration Date:   04/06/2020    Scheduling Instructions:     Early May 3 months post-covid    Order Specific Question:   Where should this test be performed?    Answer:   [facility]    Order Specific Question:   Full PFT: includes the following: basic spirometry, spirometry pre & post bronchodilator, diffusion capacity (DLCO), lung volumes    Answer:   Full PFT    Meds ordered this encounter  Medications  . Respiratory Therapy Supplies (FLUTTER) DEVI    Sig: 1 each by Does not apply route 2 (two) times daily.    Dispense:  1 each    Refill:  0  . Fluticasone-Salmeterol (ADVAIR DISKUS) 250-50 MCG/DOSE AEPB    Sig: Inhale 1 puff into the lungs 2 (two) times daily.    Dispense:  60 each     Refill:  5    Return in about 2 months (around 06/07/2019). after PFTs   [facility] [address] at [employer] [facility] [place].     Please do your part to reduce the spread of COVID-19.

## 2019-04-07 NOTE — Progress Notes (Signed)
Synopsis: Referred in March 2021 for post Covid by Burnard Hawthorne, FNP  Subjective:   PATIENT ID: Amber Richardson GENDER: female DOB: 04-22-1955, MRN: [id number]  Chief Complaint  Patient presents with  . Consult    Patient is here post covid. Patient does not feel any better since being diagnosed. Patient has shortness of breath with exertion and daily activites. Patient has a cough that is productive in the morning and then goes to dry thoughout the day. Patient is very tired and weak. Her mother and brother are currently being treated for pneumonia.     Amber Richardson is a 63 year old woman who has a history of recent COVID-19 infection diagnosed on 1/26.  She had symptoms of severe headache, severe cough leading to shortness of breath from coughing episodes, fatigue, rhinorrhea, congestion, chest pressure.  She was treated with doxycycline, a prednisone taper, opiate cough syrup, and albuterol in early February.  The prednisone helped, but there is no significant benefit from albuterol at that time.  She has been using albuterol some last few weeks with severe dyspnea on exertion, and has noted some benefit recently.  Her symptoms worsened after stopping her steroid taper.  In the last few weeks her symptoms have gotten progressively worse.  She has persistent symptoms of ongoing weakness and fatigue, significant activity limitation, dyspnea that limits her activities, dry coughing throughout the day.  She has some clear sputum production and frequently clears her throat.  She has had sneezing episodes associated with rhinorrhea and congestion, but no throat, nose, soft palate itching or itchy, watery eyes.  No postnasal drip.  No history of significant seasonal allergies.  No fever, chills, sweats.  She has no previous history of asthma or lung disease.  No family history of lung disease.  No history of smoking or vaping.  She has always been very active and healthy.  She works as a Software engineer  at [employer], but has recently not been working due to caring for with her parents while they had Covid and during her own recovery.     Past Medical History:  Diagnosis Date  . Allergy   . Basal cell carcinoma   . GERD (gastroesophageal reflux disease)   . HLD (hyperlipidemia)   . Hyperthyroidism   . Pyelonephritis 1985     Family History  Problem Relation Age of Onset  . Hypertension Mother   . Hypercholesterolemia Mother   . Heart disease Father   . Hyperlipidemia Father   . Hypercholesterolemia Father      Past Surgical History:  Procedure Laterality Date  . DILATION AND CURETTAGE OF UTERUS    . Miscarriage     x1  . TONSILLECTOMY  1963    Social History   Socioeconomic History  . Marital status: Married    Spouse name: Not on file  . Number of children: 0  . Years of education: Not on file  . Highest education level: Not on file  Occupational History  . Not on file  Tobacco Use  . Smoking status: Never Smoker  . Smokeless tobacco: Never Used  Substance and Sexual Activity  . Alcohol use: No    Alcohol/week: 0.0 standard drinks  . Drug use: No  . Sexual activity: Not on file  Other Topics Concern  . Not on file  Social History Narrative  . Not on file   Social Determinants of Health   Financial Resource Strain:   . Difficulty of Paying Living Expenses:  Food Insecurity:   . Worried About Charity fundraiser in the Last Year:   . Arboriculturist in the Last Year:   Transportation Needs:   . Film/video editor (Medical):   Marland Kitchen Lack of Transportation (Non-Medical):   Physical Activity:   . Days of Exercise per Week:   . Minutes of Exercise per Session:   Stress:   . Feeling of Stress :   Social Connections:   . Frequency of Communication with Friends and Family:   . Frequency of Social Gatherings with Friends and Family:   . Attends Religious Services:   . Active Member of Clubs or Organizations:   . Attends Archivist Meetings:   Marland Kitchen  Marital Status:   Intimate Partner Violence:   . Fear of Current or Ex-Partner:   . Emotionally Abused:   Marland Kitchen Physically Abused:   . Sexually Abused:      Allergies  Allergen Reactions  . Levaquin [Levofloxacin In D5w] Other (See Comments)    Numbness and tingling  . Sertraline Other (See Comments)    Trimmers   . Sulfa Antibiotics Rash     Immunization History  Administered Date(s) Administered  . DTaP 01/28/2011  . DTaP, 5 pertussis antigens 01/28/2011  . Influenza Inj Mdck Quad With Preservative 11/01/2017  . Influenza Split 12/05/2015  . Influenza,inj,Quad PF,6+ Mos 10/22/2018  . Influenza-Unspecified 11/01/2017    Outpatient Medications Prior to Visit  Medication Sig Dispense Refill  . albuterol (VENTOLIN HFA) 108 (90 Base) MCG/ACT inhaler Inhale 2 puffs into the lungs every 6 (six) hours as needed for wheezing or shortness of breath. 6.7 g 1  . ALPRAZolam (XANAX) 0.5 MG tablet Take by mouth.    . Calcium Citrate-Vitamin D (CITRACAL + D PO) Take by mouth. Take one in the morning & 2 in the afternoon    . doxycycline (VIBRA-TABS) 100 MG tablet Take 1 tablet (100 mg total) by mouth 2 (two) times daily. 10 tablet 0  . DULoxetine HCl 40 MG CPEP TAKE 1 ORAL CAPSULE DAILY  1  . fluticasone (FLONASE) 50 MCG/ACT nasal spray Place 2 sprays into both nostrils daily. 16 g 1  . HYDROcodone-homatropine (HYCODAN) 5-1.5 MG/5ML syrup Take 5 mLs by mouth 2 (two) times daily as needed for cough. 120 mL 0  . ibuprofen (ADVIL,MOTRIN) 600 MG tablet Take 600 mg by mouth as needed for pain.    Marland Kitchen ipratropium (ATROVENT) 0.03 % nasal spray Place 2 sprays into both nostrils every 12 (twelve) hours. 16 mL 1  . levothyroxine (SYNTHROID) 75 MCG tablet TAKE 1 TABLET (75 MCG TOTAL) BY MOUTH DAILY BEFORE BREAKFAST. 90 tablet 1  . Multiple Vitamin (MULTIVITAMIN) tablet Take 1 tablet by mouth daily.    . ondansetron (ZOFRAN ODT) 4 MG disintegrating tablet Take 1 tablet (4 mg total) by mouth every 8 (eight)  hours as needed for nausea or vomiting. 20 tablet 0  . predniSONE (DELTASONE) 10 MG tablet Take 4 tablets x 1 day and then decrease by 1/2 tablet per day until down to zero mg. 18 tablet 0  . valACYclovir (VALTREX) 500 MG tablet Take one to two tablets by mouth daily     No facility-administered medications prior to visit.    Review of Systems  Constitutional: Negative for chills, fever and weight loss.  HENT: Positive for congestion.   Respiratory: Positive for shortness of breath. Negative for wheezing.   Cardiovascular: Negative for leg swelling.  Gastrointestinal: Negative.  Endo/Heme/Allergies: Negative for environmental allergies.     Objective:   Vitals:   04/07/19 1043  BP: 130/80  Pulse: 66  Temp: 98.2 F (36.8 C)  TempSrc: Temporal  SpO2: 94%  Weight: 157 lb 12.8 oz (71.6 kg)  Height: 5\' 8"  (1.727 m)   94% on   RA BMI Readings from Last 3 Encounters:  04/07/19 23.99 kg/m  03/17/19 24.33 kg/m  02/28/19 24.33 kg/m   Wt Readings from Last 3 Encounters:  04/07/19 157 lb 12.8 oz (71.6 kg)  03/17/19 160 lb (72.6 kg)  02/28/19 160 lb (72.6 kg)    Physical Exam Vitals reviewed.  Constitutional:      Appearance: Normal appearance. She is not ill-appearing.  HENT:     Head: Normocephalic and atraumatic.  Eyes:     General: No scleral icterus. Cardiovascular:     Rate and Rhythm: Normal rate and regular rhythm.     Heart sounds: No murmur.  Pulmonary:     Comments: Breathing comfortably on RA, no conversational dyspnea. CTAB. Abdominal:     General: There is no distension.     Palpations: Abdomen is soft.     Tenderness: There is no abdominal tenderness.  Musculoskeletal:        General: No swelling or deformity.     Cervical back: Neck supple.  Lymphadenopathy:     Cervical: No cervical adenopathy.  Skin:    General: Skin is warm and dry.     Findings: No rash.  Neurological:     General: No focal deficit present.     Mental Status: She is  alert.     Motor: No weakness.     Coordination: Coordination normal.  Psychiatric:        Mood and Affect: Mood normal.        Behavior: Behavior normal.      CBC    Component Value Date/Time   WBC 6.0 09/02/2018 1429   RBC 4.05 09/02/2018 1429   HGB 13.1 09/02/2018 1429   HCT 40.0 09/02/2018 1429   PLT 241.0 09/02/2018 1429   MCV 98.7 09/02/2018 1429   MCHC 32.8 09/02/2018 1429   RDW 13.3 09/02/2018 1429   LYMPHSABS 2.2 09/02/2018 1429   MONOABS 0.4 09/02/2018 1429   EOSABS 0.0 09/02/2018 1429   BASOSABS 0.0 09/02/2018 1429    CHEMISTRY No results for input(s): NA, K, CL, CO2, GLUCOSE, BUN, CREATININE, CALCIUM, MG, PHOS in the last 168 hours. CrCl cannot be calculated (Patient's most recent lab result is older than the maximum 21 days allowed.).   Chest Imaging- films reviewed: CXR, 2 view 03/01/2019-mild patchy peripheral opacities bilaterally.  Scoliosis.  CXR 04/07/2019- resolution of previous peripheral opacities.  Pulmonary Functions Testing Results: No flowsheet data found.      Assessment & Plan:     ICD-10-CM   1. History of 2019 novel coronavirus disease (COVID-19)  Z86.16 Pulmonary function test    CT Chest High Resolution    DG Chest 2 View  2. Shortness of breath  R06.02 Pulmonary function test    CT Chest High Resolution    Chronic shortness of breath and cough since COVID-19 viral pneumonia. -CXR today-no significant opacities to suggest COP. Suspect that she may benefit from bronchodilators. The risk of steroids seems higher than the potential benefit with improvement in her CXR.  Start Advair 250-50 twice daily.  Inhaler technique reviewed and after every use. -Continue albuterol as needed -HRCT chest 2 months after infection, late April -  PFTs 3 months after diagnosis -Recommend that she stay out of work until follow-up given severity of symptoms limiting her activity. -Flutter valve twice daily  RTC in about 2 months after  PFTs.    Current Outpatient Medications:  .  albuterol (VENTOLIN HFA) 108 (90 Base) MCG/ACT inhaler, Inhale 2 puffs into the lungs every 6 (six) hours as needed for wheezing or shortness of breath., Disp: 6.7 g, Rfl: 1 .  ALPRAZolam (XANAX) 0.5 MG tablet, Take by mouth., Disp: , Rfl:  .  Calcium Citrate-Vitamin D (CITRACAL + D PO), Take by mouth. Take one in the morning & 2 in the afternoon, Disp: , Rfl:  .  doxycycline (VIBRA-TABS) 100 MG tablet, Take 1 tablet (100 mg total) by mouth 2 (two) times daily., Disp: 10 tablet, Rfl: 0 .  DULoxetine HCl 40 MG CPEP, TAKE 1 ORAL CAPSULE DAILY, Disp: , Rfl: 1 .  fluticasone (FLONASE) 50 MCG/ACT nasal spray, Place 2 sprays into both nostrils daily., Disp: 16 g, Rfl: 1 .  HYDROcodone-homatropine (HYCODAN) 5-1.5 MG/5ML syrup, Take 5 mLs by mouth 2 (two) times daily as needed for cough., Disp: 120 mL, Rfl: 0 .  ibuprofen (ADVIL,MOTRIN) 600 MG tablet, Take 600 mg by mouth as needed for pain., Disp: , Rfl:  .  ipratropium (ATROVENT) 0.03 % nasal spray, Place 2 sprays into both nostrils every 12 (twelve) hours., Disp: 16 mL, Rfl: 1 .  levothyroxine (SYNTHROID) 75 MCG tablet, TAKE 1 TABLET (75 MCG TOTAL) BY MOUTH DAILY BEFORE BREAKFAST., Disp: 90 tablet, Rfl: 1 .  Multiple Vitamin (MULTIVITAMIN) tablet, Take 1 tablet by mouth daily., Disp: , Rfl:  .  ondansetron (ZOFRAN ODT) 4 MG disintegrating tablet, Take 1 tablet (4 mg total) by mouth every 8 (eight) hours as needed for nausea or vomiting., Disp: 20 tablet, Rfl: 0 .  predniSONE (DELTASONE) 10 MG tablet, Take 4 tablets x 1 day and then decrease by 1/2 tablet per day until down to zero mg., Disp: 18 tablet, Rfl: 0 .  valACYclovir (VALTREX) 500 MG tablet, Take one to two tablets by mouth daily, Disp: , Rfl:  .  Respiratory Therapy Supplies (FLUTTER) DEVI, 1 each by Does not apply route 2 (two) times daily., Disp: 1 each, Rfl: 0     Julian Hy, DO [facility] 04/07/2019 11:25 AM

## 2019-04-08 DIAGNOSIS — Z0279 Encounter for issue of other medical certificate: Secondary | ICD-10-CM

## 2019-04-08 NOTE — Telephone Encounter (Signed)
Paperwork filled out and placed in your forms folder for your review. There are a couple questions for you to fill out and also a blank copy attached.

## 2019-04-11 ENCOUNTER — Telehealth: Payer: Self-pay | Admitting: Critical Care Medicine

## 2019-04-11 NOTE — Telephone Encounter (Signed)
Forms signed and sent back to [employer]

## 2019-04-12 NOTE — Telephone Encounter (Signed)
Pt aware.

## 2019-04-12 NOTE — Telephone Encounter (Signed)
Paperwork sent to [employer]

## 2019-04-12 NOTE — Telephone Encounter (Signed)
Form completed.  Placed in box.   

## 2019-04-13 ENCOUNTER — Other Ambulatory Visit: Payer: Self-pay | Admitting: Family

## 2019-04-13 DIAGNOSIS — Z20822 Contact with and (suspected) exposure to covid-19: Secondary | ICD-10-CM

## 2019-04-14 DIAGNOSIS — Z730 Burn-out: Secondary | ICD-10-CM | POA: Diagnosis not present

## 2019-04-14 DIAGNOSIS — R69 Illness, unspecified: Secondary | ICD-10-CM | POA: Diagnosis not present

## 2019-04-14 DIAGNOSIS — Z634 Disappearance and death of family member: Secondary | ICD-10-CM | POA: Diagnosis not present

## 2019-04-29 ENCOUNTER — Other Ambulatory Visit: Payer: Self-pay | Admitting: Internal Medicine

## 2019-05-03 ENCOUNTER — Other Ambulatory Visit: Payer: Self-pay

## 2019-05-03 ENCOUNTER — Ambulatory Visit (INDEPENDENT_AMBULATORY_CARE_PROVIDER_SITE_OTHER)
Admission: RE | Admit: 2019-05-03 | Discharge: 2019-05-03 | Disposition: A | Discharge status: Home / Self Care | Payer: 59 | Source: Ambulatory Visit | Attending: Critical Care Medicine | Admitting: Critical Care Medicine

## 2019-05-03 DIAGNOSIS — R0602 Shortness of breath: Secondary | ICD-10-CM | POA: Diagnosis not present

## 2019-05-03 DIAGNOSIS — Z8616 Personal history of COVID-19: Secondary | ICD-10-CM

## 2019-05-03 NOTE — Progress Notes (Signed)
Please let Amber Richardson know that her CT scan showed very mild scarring in her lungs after having covid. This may get better over time or it may not. We will have to follow it to determine if there is progression.  Thanks!

## 2019-05-05 ENCOUNTER — Telehealth: Payer: Self-pay | Admitting: Critical Care Medicine

## 2019-05-05 NOTE — Telephone Encounter (Signed)
Rec'd completed paperwork - fwd to [employer] via interoffice mail -pr  

## 2019-05-10 ENCOUNTER — Other Ambulatory Visit: Payer: Self-pay | Admitting: Internal Medicine

## 2019-05-16 ENCOUNTER — Telehealth: Payer: Self-pay | Admitting: Critical Care Medicine

## 2019-05-16 MED ORDER — NYSTATIN 100000 UNIT/ML MT SUSP: 5.0000 mL | Freq: Two times a day (BID) | OROMUCOSAL | 0 refills | Status: DC

## 2019-05-16 NOTE — Telephone Encounter (Signed)
Spoke with pt, she states she has thrush in the back part of her mouth, more towards the throat. She reports her epiglottis is inflamed and she can see white patches on them. She rinses her mouth and brushes her teeth after every use of Advair. If we decide to treat her, when should she restart the Advair? She has her PFT scheduled on 05/23/2019.  Dr. Carlis Abbott please advise.

## 2019-05-16 NOTE — Telephone Encounter (Signed)
Amber Richardson developed oral thrush while on Advair.  Recommend hold Advair for a week and use nystatin p.o. twice daily for 7 days.  She can resume Advair after 1 week.  Julian Hy, DO 05/16/19 12:04 PM [facility]

## 2019-05-16 NOTE — Telephone Encounter (Signed)
Called pt and advised message from the provider. Pt understood and verbalized understanding. Nothing further is needed.    Rx sent in. 

## 2019-05-19 NOTE — Progress Notes (Signed)
Pt tested positive for COVID-19 within the last 90 days of procedure. Re-testing will not be required per hospital guidelines. Pt is aware and appointment has been cancelled.   Jacqlyn Larsen, RN  Status:  Final result  Visible to patient:  Yes (MyChart)  Next appt:  05/23/2019 at 12:00 PM in Pulmonology ([facility])  Dx:  Suspected COVID-19 virus infection Specimen Information: Nasopharyngeal(NP) swabs in vial transport medium   NASOPHARYNGE TESTING      Ref Range & Units 2 mo ago  SARS-CoV-2, NAA Not Detected DetectedAbnormal    Comment: This nucleic acid amplification test was developed and its performance  characteristics determined by [employer]. Nucleic acid  amplification tests include RT-PCR and TMA. This test has not been  FDA cleared or approved. This test has been authorized by FDA under  an Emergency Use Authorization (EUA). This test is only authorized  for the duration of time the declaration that circumstances exist  justifying the authorization of the emergency use of in vitro  diagnostic tests for detection of SARS-CoV-2 virus and/or diagnosis  of COVID-19 infection under section 564(b)(1) of the Act, 21 U.S.C.  PT:[id number]) (1), unless the authorization is terminated or revoked  sooner.  When diagnostic testing is negative, the possibility of a false  negative result should be considered in the context of a patient's  recent exposures and the presence of clinical signs and symptoms  consistent with COVID-19. An individual without symptoms of COVID-19  and who is not shedding SARS-CoV-2 virus would expect to have a  negative (not detected) result in this assay.   Resulting Agency  [facility]    Narrative Performed by: Maryan Puls Performed at: [facility], [place], [place] [id number]  Lab Director: Katina Degree MDPhD, Phone: [phone]    Specimen Collected: 02/22/19 15:10 Last Resulted: 02/23/19 14:36

## 2019-05-20 ENCOUNTER — Inpatient Hospital Stay (HOSPITAL_COMMUNITY): Admission: RE | Admit: 2019-05-20 | Payer: 59 | Source: Ambulatory Visit

## 2019-05-23 ENCOUNTER — Ambulatory Visit (INDEPENDENT_AMBULATORY_CARE_PROVIDER_SITE_OTHER): Payer: 59 | Admitting: Critical Care Medicine

## 2019-05-23 ENCOUNTER — Other Ambulatory Visit: Payer: Self-pay

## 2019-05-23 DIAGNOSIS — R0602 Shortness of breath: Secondary | ICD-10-CM

## 2019-05-23 DIAGNOSIS — Z8616 Personal history of COVID-19: Secondary | ICD-10-CM | POA: Diagnosis not present

## 2019-05-23 LAB — PULMONARY FUNCTION TEST
DL/VA % pred: 111 %
DL/VA: 4.56 ml/min/mmHg/L
DLCO cor % pred: 101 %
DLCO cor: 23.34 ml/min/mmHg
DLCO unc % pred: 101 %
DLCO unc: 23.34 ml/min/mmHg
FEF 25-75 Post: 3.3 L/sec
FEF 25-75 Pre: 2.55 L/sec
FEF2575-%Change-Post: 29 %
FEF2575-%Pred-Post: 129 %
FEF2575-%Pred-Pre: 100 %
FEV1-%Change-Post: 5 %
FEV1-%Pred-Post: 92 %
FEV1-%Pred-Pre: 87 %
FEV1-Post: 2.7 L
FEV1-Pre: 2.56 L
FEV1FVC-%Change-Post: 3 %
FEV1FVC-%Pred-Pre: 103 %
FEV6-%Change-Post: 2 %
FEV6-%Pred-Post: 89 %
FEV6-%Pred-Pre: 86 %
FEV6-Post: 3.26 L
FEV6-Pre: 3.17 L
FEV6FVC-%Pred-Post: 103 %
FEV6FVC-%Pred-Pre: 103 %
FVC-%Change-Post: 2 %
FVC-%Pred-Post: 86 %
FVC-%Pred-Pre: 83 %
FVC-Post: 3.26 L
FVC-Pre: 3.17 L
Post FEV1/FVC ratio: 83 %
Post FEV6/FVC ratio: 100 %
Pre FEV1/FVC ratio: 81 %
Pre FEV6/FVC Ratio: 100 %
RV % pred: 85 %
RV: 1.89 L
TLC % pred: 94 %
TLC: 5.35 L

## 2019-05-23 NOTE — Progress Notes (Signed)
Full PFT performed today. °

## 2019-06-06 ENCOUNTER — Other Ambulatory Visit: Payer: Self-pay | Admitting: Internal Medicine

## 2019-06-13 ENCOUNTER — Ambulatory Visit: Payer: 59 | Admitting: Critical Care Medicine

## 2019-06-15 DIAGNOSIS — R69 Illness, unspecified: Secondary | ICD-10-CM | POA: Diagnosis not present

## 2019-06-15 DIAGNOSIS — F4322 Adjustment disorder with anxiety: Secondary | ICD-10-CM | POA: Diagnosis not present

## 2019-06-15 DIAGNOSIS — Z730 Burn-out: Secondary | ICD-10-CM | POA: Diagnosis not present

## 2019-06-16 ENCOUNTER — Telehealth: Payer: Self-pay | Admitting: Critical Care Medicine

## 2019-06-16 NOTE — Telephone Encounter (Signed)
[employer]  Placed on long hold  Unfortunately, no ext was provided  Before we call back- Dr Carlis Abbott- can you please advise on what date pt can return to work? It looks like this was th reason for the call. Thanks!

## 2019-06-20 NOTE — Telephone Encounter (Signed)
If she is up for it, I am fine for her to go back now. If she wants to stay out longer, I am fine to write a note for her to stay out until follow up with me, which should be within the next month.  LPC

## 2019-06-21 NOTE — Telephone Encounter (Signed)
[employer], was on a long hold with no one coming to the line. Will try back.

## 2019-06-23 NOTE — Telephone Encounter (Signed)
Called and talked to Amber Richardson at [facility]. They needed the dates for the last OV and for her follow up OV which is 6/9 at 2:45. Nothing further needed at this time

## 2019-06-30 ENCOUNTER — Other Ambulatory Visit: Payer: 59

## 2019-07-06 ENCOUNTER — Other Ambulatory Visit: Payer: Self-pay

## 2019-07-06 ENCOUNTER — Ambulatory Visit (INDEPENDENT_AMBULATORY_CARE_PROVIDER_SITE_OTHER): Payer: 59 | Admitting: Critical Care Medicine

## 2019-07-06 ENCOUNTER — Encounter: Payer: Self-pay | Admitting: Critical Care Medicine

## 2019-07-06 VITALS — BP 130/80 | HR 73 | Temp 98.3°F | Ht 68.0 in | Wt 163.4 lb

## 2019-07-06 DIAGNOSIS — Z8616 Personal history of COVID-19: Secondary | ICD-10-CM

## 2019-07-06 DIAGNOSIS — R0602 Shortness of breath: Secondary | ICD-10-CM

## 2019-07-06 DIAGNOSIS — R413 Other amnesia: Secondary | ICD-10-CM | POA: Diagnosis not present

## 2019-07-06 NOTE — Patient Instructions (Addendum)
Thank you for visiting Dr. Carlis Abbott at [facility]. We recommend the following: Orders Placed This Encounter  Procedures  . Ambulatory referral to Neurology   Orders Placed This Encounter  Procedures  . Ambulatory referral to Neurology    Referral Priority:   Routine    Referral Type:   Consultation    Referral Reason:   Specialty Services Required    Requested Specialty:   Neurology    Number of Visits Requested:   1   Stay on Advair twice daily. Rinse your mouth after every use. Keep using albuterol as needed.  Have neurology make recommendations regarding return to work.    Return in about 4 months (around 11/05/2019).    Please do your part to reduce the spread of COVID-19.

## 2019-07-06 NOTE — Progress Notes (Signed)
Synopsis: Referred in March 2021 for post Covid by Einar Pheasant, MD.  Subjective:   PATIENT ID: Amber Arenas GENDER: female DOB: 21-Jan-1956, MRN: [id number]  Chief Complaint  Patient presents with  . Follow-up    c/o diffuculty breathing with exertion, increase in HR with activity, 'foggy' memory has worsened in last 6 weeks, fatigue    Amber Richardson is a 64 year old woman who presents for follow-up with shortness of breath post Covid.  Since starting Advair her symptoms have improved.  She has been working on increasing her exercise tolerance with walking.  The Advair has helped more than the albuterol.  She has been doing better overall, but is not back to her baseline.  Walking up stairs and exertion such as taking a shower still significantly depletes her.  She is not yet back to work.  She is concerned about her memory issues.  This is been more noticeable in the last 6 weeks.  She has left the stove on when cooking in his left water running.  She works as a Software engineer and is concerned that she would be too fatigued and not cognitively able to handle her work.     OV 04/07/19: Amber Richardson is a 64 year old woman who has a history of recent COVID-19 infection diagnosed on 1/26.  She had symptoms of severe headache, severe cough leading to shortness of breath from coughing episodes, fatigue, rhinorrhea, congestion, chest pressure.  She was treated with doxycycline, a prednisone taper, opiate cough syrup, and albuterol in early February.  The prednisone helped, but there is no significant benefit from albuterol at that time.  She has been using albuterol some last few weeks with severe dyspnea on exertion, and has noted some benefit recently.  Her symptoms worsened after stopping her steroid taper.  In the last few weeks her symptoms have gotten progressively worse.  She has persistent symptoms of ongoing weakness and fatigue, significant activity limitation, dyspnea that limits her  activities, dry coughing throughout the day.  She has some clear sputum production and frequently clears her throat.  She has had sneezing episodes associated with rhinorrhea and congestion, but no throat, nose, soft palate itching or itchy, watery eyes.  No postnasal drip.  No history of significant seasonal allergies.  No fever, chills, sweats.  She has no previous history of asthma or lung disease.  No family history of lung disease.  No history of smoking or vaping.  She has always been very active and healthy.  She works as a Software engineer at [employer], but has recently not been working due to caring for with her parents while they had Covid and during her own recovery.   Past Medical History:  Diagnosis Date  . Allergy   . Basal cell carcinoma   . COVID-19 01/2019  . GERD (gastroesophageal reflux disease)   . HLD (hyperlipidemia)   . Hyperthyroidism   . Pyelonephritis 1985     Family History  Problem Relation Age of Onset  . Hypertension Mother   . Hypercholesterolemia Mother   . Heart disease Father   . Hyperlipidemia Father   . Hypercholesterolemia Father      Past Surgical History:  Procedure Laterality Date  . DILATION AND CURETTAGE OF UTERUS    . Miscarriage     x1  . TONSILLECTOMY  1963    Social History   Socioeconomic History  . Marital status: Married    Spouse name: Not on file  . Number of children: 0  .  Years of education: Not on file  . Highest education level: Not on file  Occupational History  . Not on file  Tobacco Use  . Smoking status: Never Smoker  . Smokeless tobacco: Never Used  Substance and Sexual Activity  . Alcohol use: No    Alcohol/week: 0.0 standard drinks  . Drug use: No  . Sexual activity: Not on file  Other Topics Concern  . Not on file  Social History Narrative  . Not on file   Social Determinants of Health   Financial Resource Strain:   . Difficulty of Paying Living Expenses:   Food Insecurity:   . Worried About Sales executive in the Last Year:   . Arboriculturist in the Last Year:   Transportation Needs:   . Film/video editor (Medical):   Marland Kitchen Lack of Transportation (Non-Medical):   Physical Activity:   . Days of Exercise per Week:   . Minutes of Exercise per Session:   Stress:   . Feeling of Stress :   Social Connections:   . Frequency of Communication with Friends and Family:   . Frequency of Social Gatherings with Friends and Family:   . Attends Religious Services:   . Active Member of Clubs or Organizations:   . Attends Archivist Meetings:   Marland Kitchen Marital Status:   Intimate Partner Violence:   . Fear of Current or Ex-Partner:   . Emotionally Abused:   Marland Kitchen Physically Abused:   . Sexually Abused:      Allergies  Allergen Reactions  . Levaquin [Levofloxacin In D5w] Other (See Comments)    Numbness and tingling  . Sertraline Other (See Comments)    Trimmers   . Sulfa Antibiotics Rash     Immunization History  Administered Date(s) Administered  . DTaP 01/28/2011  . DTaP, 5 pertussis antigens 01/28/2011  . Influenza Inj Mdck Quad With Preservative 11/01/2017  . Influenza Split 12/05/2015  . Influenza,inj,Quad PF,6+ Mos 10/22/2018  . Influenza-Unspecified 11/01/2017    Outpatient Medications Prior to Visit  Medication Sig Dispense Refill  . albuterol (VENTOLIN HFA) 108 (90 Base) MCG/ACT inhaler TAKE 2 PUFFS BY MOUTH EVERY 6 HOURS AS NEEDED FOR WHEEZE OR SHORTNESS OF BREATH 6.7 Richardson 1  . ALPRAZolam (XANAX) 0.5 MG tablet Take by mouth.    . Calcium Citrate-Vitamin D (CITRACAL + D PO) Take by mouth. Take one in the morning & 2 in the afternoon    . DULoxetine HCl 40 MG CPEP TAKE 1 ORAL CAPSULE DAILY  1  . fluticasone (FLONASE) 50 MCG/ACT nasal spray SPRAY 2 SPRAYS INTO EACH NOSTRIL EVERY DAY 16 mL 1  . Fluticasone-Salmeterol (ADVAIR DISKUS) 250-50 MCG/DOSE AEPB Inhale 1 puff into the lungs 2 (two) times daily. 60 each 5  . ibuprofen (ADVIL,MOTRIN) 600 MG tablet Take 600 mg by mouth as  needed for pain.    Marland Kitchen ipratropium (ATROVENT) 0.03 % nasal spray PLACE 2 SPRAYS INTO BOTH NOSTRILS EVERY 12 (TWELVE) HOURS. 30 mL 1  . levothyroxine (SYNTHROID) 75 MCG tablet TAKE 1 TABLET (75 MCG TOTAL) BY MOUTH DAILY BEFORE BREAKFAST. 90 tablet 1  . Multiple Vitamin (MULTIVITAMIN) tablet Take 1 tablet by mouth daily.    Marland Kitchen nystatin (MYCOSTATIN) 100000 UNIT/ML suspension Take 5 mLs (500,000 Units total) by mouth 2 (two) times daily. 120 mL 0  . Respiratory Therapy Supplies (FLUTTER) DEVI 1 each by Does not apply route 2 (two) times daily. 1 each 0  . valACYclovir (VALTREX)  500 MG tablet Take one to two tablets by mouth daily    . doxycycline (VIBRA-TABS) 100 MG tablet Take 1 tablet (100 mg total) by mouth 2 (two) times daily. 10 tablet 0  . HYDROcodone-homatropine (HYCODAN) 5-1.5 MG/5ML syrup Take 5 mLs by mouth 2 (two) times daily as needed for cough. 120 mL 0  . ondansetron (ZOFRAN ODT) 4 MG disintegrating tablet Take 1 tablet (4 mg total) by mouth every 8 (eight) hours as needed for nausea or vomiting. 20 tablet 0  . predniSONE (DELTASONE) 10 MG tablet Take 4 tablets x 1 day and then decrease by 1/2 tablet per day until down to zero mg. 18 tablet 0   No facility-administered medications prior to visit.    Review of Systems  Constitutional: Negative for chills, fever and weight loss.  HENT: Positive for congestion.   Respiratory: Positive for shortness of breath. Negative for wheezing.   Cardiovascular: Negative for leg swelling.  Gastrointestinal: Negative.   Endo/Heme/Allergies: Negative for environmental allergies.     Objective:   Vitals:   07/06/19 1515  BP: 130/80  Pulse: 73  Temp: 98.3 F (36.8 C)  TempSrc: Oral  SpO2: 99%  Weight: 163 lb 6.4 oz (74.1 kg)  Height: 5\' 8"  (1.727 m)   99% on   RA BMI Readings from Last 3 Encounters:  07/06/19 24.84 kg/m  04/07/19 23.99 kg/m  03/17/19 24.33 kg/m   Wt Readings from Last 3 Encounters:  07/06/19 163 lb 6.4 oz (74.1  kg)  04/07/19 157 lb 12.8 oz (71.6 kg)  03/17/19 160 lb (72.6 kg)    Physical Exam Vitals reviewed.  Constitutional:      General: She is not in acute distress.    Appearance: Normal appearance. She is not ill-appearing.  HENT:     Head: Normocephalic and atraumatic.  Eyes:     General: No scleral icterus. Cardiovascular:     Rate and Rhythm: Normal rate and regular rhythm.     Heart sounds: No murmur.  Pulmonary:     Comments: Breathing comfortably on RA, speaking in full sentences. CTAB. Abdominal:     General: There is no distension.     Palpations: Abdomen is soft.  Musculoskeletal:        General: No swelling or deformity.     Cervical back: Neck supple.  Lymphadenopathy:     Cervical: No cervical adenopathy.  Skin:    General: Skin is warm and dry.     Findings: No rash.  Neurological:     General: No focal deficit present.     Mental Status: She is alert.     Coordination: Coordination normal.  Psychiatric:        Mood and Affect: Mood normal.        Behavior: Behavior normal.      CBC    Component Value Date/Time   WBC 6.0 09/02/2018 1429   RBC 4.05 09/02/2018 1429   HGB 13.1 09/02/2018 1429   HCT 40.0 09/02/2018 1429   PLT 241.0 09/02/2018 1429   MCV 98.7 09/02/2018 1429   MCHC 32.8 09/02/2018 1429   RDW 13.3 09/02/2018 1429   LYMPHSABS 2.2 09/02/2018 1429   MONOABS 0.4 09/02/2018 1429   EOSABS 0.0 09/02/2018 1429   BASOSABS 0.0 09/02/2018 1429    CHEMISTRY No results for input(s): NA, K, CL, CO2, GLUCOSE, BUN, CREATININE, CALCIUM, MG, PHOS in the last 168 hours. CrCl cannot be calculated (Patient's most recent lab result is older than the  maximum 21 days allowed.).   Chest Imaging- films reviewed: CXR, 2 view 03/01/2019-mild patchy peripheral opacities bilaterally.  Scoliosis.  CXR 04/07/2019- resolution of previous peripheral opacities.  HRCT chest 05/03/2019- No significant fibrosis. Tiny area in dependent right lower lobe with GG.    Pulmonary Functions Testing Results: PFT Results Latest Ref Rng & Units 05/23/2019  FVC-Pre L 3.17  FVC-Predicted Pre % 83  FVC-Post L 3.26  FVC-Predicted Post % 86  Pre FEV1/FVC % % 81  Post FEV1/FCV % % 83  FEV1-Pre L 2.56  FEV1-Predicted Pre % 87  FEV1-Post L 2.70  DLCO UNC% % 101  DLCO COR %Predicted % 111  TLC L 5.35  TLC % Predicted % 94  RV % Predicted % 85   2021-no significant obstruction or bronchodilator reversibility.  No restriction.  Normal diffusion capacity.  Normal PFT.     Assessment & Plan:     ICD-10-CM   1. History of 2019 novel coronavirus disease (COVID-19)  Z86.16 Ambulatory referral to Neurology  2. Shortness of breath  R06.02   3. Memory impairment  R41.3 Ambulatory referral to Neurology    Chronic shortness of breath and cough since COVID-19 viral pneumonia.  No evidence of ILD or uncontrolled obstructive lung disease.  Possible mild persistent asthma as PFTs were done after starting Advair. -Continue Advair 250-50 twice daily.  Continue to rinse her mouth after use.  At follow-up we will determine if we can de-escalate to lower dose versus ICS monotherapy.  Would avoid doing this until the weather cools off in late summer/early fall. -Continue albuterol as needed -Likely with normal PFTs and CT chest, her symptoms are likely more related to chronic fatigue and deconditioning.  I do not anticipate that she will have permanent pulmonary limitations after she recovers fully from Covid.  Memory impairment, fatigue post covid -Recommend that she stay out of work until she can be evaluated by neurology.  I will defer to them when she would be most appropriate to return to work.  From the fatigue and shortness of breath standpoint, she likely should start back with part-time work to start with.   RTC in about 4 months.    Current Outpatient Medications:  .  albuterol (VENTOLIN HFA) 108 (90 Base) MCG/ACT inhaler, TAKE 2 PUFFS BY MOUTH EVERY 6 HOURS AS  NEEDED FOR WHEEZE OR SHORTNESS OF BREATH, Disp: 6.7 Richardson, Rfl: 1 .  ALPRAZolam (XANAX) 0.5 MG tablet, Take by mouth., Disp: , Rfl:  .  Calcium Citrate-Vitamin D (CITRACAL + D PO), Take by mouth. Take one in the morning & 2 in the afternoon, Disp: , Rfl:  .  DULoxetine HCl 40 MG CPEP, TAKE 1 ORAL CAPSULE DAILY, Disp: , Rfl: 1 .  fluticasone (FLONASE) 50 MCG/ACT nasal spray, SPRAY 2 SPRAYS INTO EACH NOSTRIL EVERY DAY, Disp: 16 mL, Rfl: 1 .  Fluticasone-Salmeterol (ADVAIR DISKUS) 250-50 MCG/DOSE AEPB, Inhale 1 puff into the lungs 2 (two) times daily., Disp: 60 each, Rfl: 5 .  ibuprofen (ADVIL,MOTRIN) 600 MG tablet, Take 600 mg by mouth as needed for pain., Disp: , Rfl:  .  ipratropium (ATROVENT) 0.03 % nasal spray, PLACE 2 SPRAYS INTO BOTH NOSTRILS EVERY 12 (TWELVE) HOURS., Disp: 30 mL, Rfl: 1 .  levothyroxine (SYNTHROID) 75 MCG tablet, TAKE 1 TABLET (75 MCG TOTAL) BY MOUTH DAILY BEFORE BREAKFAST., Disp: 90 tablet, Rfl: 1 .  Multiple Vitamin (MULTIVITAMIN) tablet, Take 1 tablet by mouth daily., Disp: , Rfl:  .  nystatin (MYCOSTATIN) 100000  UNIT/ML suspension, Take 5 mLs (500,000 Units total) by mouth 2 (two) times daily., Disp: 120 mL, Rfl: 0 .  Respiratory Therapy Supplies (FLUTTER) DEVI, 1 each by Does not apply route 2 (two) times daily., Disp: 1 each, Rfl: 0 .  valACYclovir (VALTREX) 500 MG tablet, Take one to two tablets by mouth daily, Disp: , Rfl:      Julian Hy, DO [facility] 07/06/2019 3:42 PM

## 2019-07-08 ENCOUNTER — Encounter: Payer: Self-pay | Admitting: Psychology

## 2019-07-10 ENCOUNTER — Other Ambulatory Visit: Payer: Self-pay | Admitting: Internal Medicine

## 2019-07-21 DIAGNOSIS — Z598 Other problems related to housing and economic circumstances: Secondary | ICD-10-CM | POA: Diagnosis not present

## 2019-07-21 DIAGNOSIS — F4322 Adjustment disorder with anxiety: Secondary | ICD-10-CM | POA: Diagnosis not present

## 2019-07-21 DIAGNOSIS — R69 Illness, unspecified: Secondary | ICD-10-CM | POA: Diagnosis not present

## 2019-07-21 DIAGNOSIS — Z566 Other physical and mental strain related to work: Secondary | ICD-10-CM | POA: Diagnosis not present

## 2019-07-28 DIAGNOSIS — R197 Diarrhea, unspecified: Secondary | ICD-10-CM | POA: Diagnosis not present

## 2019-07-29 DIAGNOSIS — R3915 Urgency of urination: Secondary | ICD-10-CM | POA: Diagnosis not present

## 2019-07-29 DIAGNOSIS — A09 Infectious gastroenteritis and colitis, unspecified: Secondary | ICD-10-CM | POA: Diagnosis not present

## 2019-08-11 ENCOUNTER — Telehealth: Payer: Self-pay | Admitting: Internal Medicine

## 2019-08-11 NOTE — Telephone Encounter (Signed)
[employer] called and requested copy of positive covid test for pt

## 2019-08-16 NOTE — Telephone Encounter (Signed)
No CB number or fax number to send to.

## 2019-08-18 ENCOUNTER — Other Ambulatory Visit: Payer: Self-pay | Admitting: Family

## 2019-08-18 DIAGNOSIS — Z20822 Contact with and (suspected) exposure to covid-19: Secondary | ICD-10-CM

## 2019-08-25 ENCOUNTER — Telehealth (INDEPENDENT_AMBULATORY_CARE_PROVIDER_SITE_OTHER): Payer: 59 | Admitting: Internal Medicine

## 2019-08-25 DIAGNOSIS — R5383 Other fatigue: Secondary | ICD-10-CM | POA: Diagnosis not present

## 2019-08-25 DIAGNOSIS — Z8616 Personal history of COVID-19: Secondary | ICD-10-CM | POA: Diagnosis not present

## 2019-08-25 DIAGNOSIS — F439 Reaction to severe stress, unspecified: Secondary | ICD-10-CM

## 2019-08-25 DIAGNOSIS — R202 Paresthesia of skin: Secondary | ICD-10-CM

## 2019-08-25 DIAGNOSIS — R413 Other amnesia: Secondary | ICD-10-CM | POA: Diagnosis not present

## 2019-08-25 DIAGNOSIS — E0590 Thyrotoxicosis, unspecified without thyrotoxic crisis or storm: Secondary | ICD-10-CM

## 2019-08-25 DIAGNOSIS — R739 Hyperglycemia, unspecified: Secondary | ICD-10-CM | POA: Diagnosis not present

## 2019-08-25 DIAGNOSIS — R251 Tremor, unspecified: Secondary | ICD-10-CM | POA: Diagnosis not present

## 2019-08-25 DIAGNOSIS — E7800 Pure hypercholesterolemia, unspecified: Secondary | ICD-10-CM | POA: Diagnosis not present

## 2019-08-25 DIAGNOSIS — R69 Illness, unspecified: Secondary | ICD-10-CM | POA: Diagnosis not present

## 2019-08-25 NOTE — Progress Notes (Signed)
Patient ID: Amber Richardson, female   DOB: 05/09/1955, 63 y.o.   MRN: [id number]   Virtual Visit via video Note  This visit type was conducted due to national recommendations for restrictions regarding the COVID-19 pandemic (e.g. social distancing).  This format is felt to be most appropriate for this patient at this time.  All issues noted in this document were discussed and addressed.  No physical exam was performed (except for noted visual exam findings with Video Visits).   I connected with Deby Cumpian by a video enabled telemedicine application and verified that I am speaking with the correct person using two identifiers. Location patient: home Location provider: work  Persons participating in the virtual visit: patient, provider  The limitations, risks, security and privacy concerns of performing an evaluation and management service by video and the availability of in person appointments have been discussed.  It has also been discussed with the patient that there may be a patient responsible charge related to this service. The patient expressed understanding and agreed to proceed.   Reason for visit: scheduled follow up.   HPI: History of covid infection.  Has had persistent sob s/p covid.  Seeing pulmonary.  Using advair.  Has albuterol if needed. Breathing is better, but still has problems with increased sob.  Walking up stairs and taking a shower - increased fatigue.  Never experienced loss of taste or loss of smell.  She is losing her hair.  Has gained weight.  Would like thyroid checked.  Has been having issues with her memory - immediate short term memory.  States while cooking, walked away and left stove on.  Also has turned water on and forgot she turned it on.  When unloading groceries, has left some in the car.  Also hard for her to remember what she is saying.  Concerned about returning to work - with memory issues.  Increased fatigue.  Also having issues with panic attacks.   Seeing psychiatry.  She also describes side scalp tingling - bilateral.  Will last approximately one minute - to longest episode a few minutes.  Occurs daily to qod.  Arms and legs will sometimes feel like tingling as well.  Has noticed tremors. Hard to type.  Scheduled to see neuropsych 09/01/19.  She is eating.  No vomiting.  Bowels stable.    ROS: See pertinent positives and negatives per HPI.  Past Medical History:  Diagnosis Date  . Allergy   . Basal cell carcinoma   . COVID-19 01/2019  . GERD (gastroesophageal reflux disease)   . HLD (hyperlipidemia)   . Hyperthyroidism   . Pyelonephritis 1985    Past Surgical History:  Procedure Laterality Date  . DILATION AND CURETTAGE OF UTERUS    . Miscarriage     x1  . TONSILLECTOMY  1963    Family History  Problem Relation Age of Onset  . Hypertension Mother   . Hypercholesterolemia Mother   . Heart disease Father   . Hyperlipidemia Father   . Hypercholesterolemia Father     SOCIAL HX: reviewed.    Current Outpatient Medications:  .  albuterol (VENTOLIN HFA) 108 (90 Base) MCG/ACT inhaler, TAKE 2 PUFFS BY MOUTH EVERY 6 HOURS AS NEEDED FOR WHEEZE OR SHORTNESS OF BREATH, Disp: 18 g, Rfl: 1 .  ALPRAZolam (XANAX) 0.5 MG tablet, Take by mouth., Disp: , Rfl:  .  Calcium Citrate-Vitamin D (CITRACAL + D PO), Take by mouth. Take one in the morning & 2 in the afternoon,  Disp: , Rfl:  .  DULoxetine HCl 40 MG CPEP, TAKE 1 ORAL CAPSULE DAILY, Disp: , Rfl: 1 .  fluticasone (FLONASE) 50 MCG/ACT nasal spray, SPRAY 2 SPRAYS INTO EACH NOSTRIL EVERY DAY, Disp: 16 mL, Rfl: 1 .  Fluticasone-Salmeterol (ADVAIR DISKUS) 250-50 MCG/DOSE AEPB, Inhale 1 puff into the lungs 2 (two) times daily., Disp: 60 each, Rfl: 5 .  ibuprofen (ADVIL,MOTRIN) 600 MG tablet, Take 600 mg by mouth as needed for pain., Disp: , Rfl:  .  ipratropium (ATROVENT) 0.03 % nasal spray, PLACE 2 SPRAYS INTO BOTH NOSTRILS EVERY 12 (TWELVE) HOURS., Disp: 30 mL, Rfl: 1 .  levothyroxine  (SYNTHROID) 75 MCG tablet, TAKE 1 TABLET (75 MCG TOTAL) BY MOUTH DAILY BEFORE BREAKFAST., Disp: 90 tablet, Rfl: 1 .  Multiple Vitamin (MULTIVITAMIN) tablet, Take 1 tablet by mouth daily., Disp: , Rfl:  .  nystatin (MYCOSTATIN) 100000 UNIT/ML suspension, Take 5 mLs (500,000 Units total) by mouth 2 (two) times daily., Disp: 120 mL, Rfl: 0 .  Respiratory Therapy Supplies (FLUTTER) DEVI, 1 each by Does not apply route 2 (two) times daily., Disp: 1 each, Rfl: 0 .  valACYclovir (VALTREX) 500 MG tablet, Take one to two tablets by mouth daily, Disp: , Rfl:   EXAM:  GENERAL: alert, oriented, appears well and in no acute distress  HEENT: atraumatic, conjunttiva clear, no obvious abnormalities on inspection of external nose and ears  NECK: normal movements of the head and neck  LUNGS: on inspection no signs of respiratory distress, breathing rate appears normal, no obvious gross SOB, gasping or wheezing  CV: no obvious cyanosis  PSYCH/NEURO: pleasant and cooperative, no obvious depression or anxiety, speech and thought processing grossly intact  ASSESSMENT AND PLAN:  Discussed the following assessment and plan:  Stress Increased stress with current medical issues, etc.  Experiencing some panic attacks.  Seeing psychiatry.    Hypercholesterolemia Off cholesterol medication.  Low cholesterol diet and exercise.  Follow lipid panel.    Fatigue Increased fatigue.  Persistent.  S/p covid.  Request to have thyroid rechecked.  Check routine labs.    History of COVID-19 History of covid infection.  Residual sob as outlined.  CT chest ok.  PFTs ok.  Continue advair.  Has albuterol to use prn.  Continue to work on Comptroller.    Memory change Issues with short term memory and forgetfulness.  Has appt scheduled with neuropsych for evaluation.  Further w/up pending their assessment.    Tingling Has noticed tingling scalp.  May only last 1-3 minutes.  Also some occasional tingling in her  extremities.  Discussed further neurological w/up.  Keep scheduled appt.   Tremor Tremors as outlined.  Neurology evaluation.   Hyperthyroidism Off tapazole.  Check tsh.  On synthroid.   Hyperglycemia Low carb diet and exercise.  Follow met b and a1c.    Orders Placed This Encounter  Procedures  . CBC with Differential/Platelet    Standing Status:   Future    Standing Expiration Date:   08/27/2020  . Hemoglobin A1c    Standing Status:   Future    Standing Expiration Date:   08/27/2020  . Hepatic function panel    Standing Status:   Future    Standing Expiration Date:   08/27/2020  . Lipid panel    Standing Status:   Future    Standing Expiration Date:   08/27/2020  . TSH    Standing Status:   Future    Standing Expiration Date:  08/27/2020  . Basic metabolic panel    Standing Status:   Future    Standing Expiration Date:   08/27/2020     I discussed the assessment and treatment plan with the patient. The patient was provided an opportunity to ask questions and all were answered. The patient agreed with the plan and demonstrated an understanding of the instructions.   The patient was advised to call back or seek an in-person evaluation if the symptoms worsen or if the condition fails to improve as anticipated.   Einar Pheasant, MD

## 2019-08-26 DIAGNOSIS — S40862A Insect bite (nonvenomous) of left upper arm, initial encounter: Secondary | ICD-10-CM | POA: Diagnosis not present

## 2019-08-26 DIAGNOSIS — S40861A Insect bite (nonvenomous) of right upper arm, initial encounter: Secondary | ICD-10-CM | POA: Diagnosis not present

## 2019-08-26 DIAGNOSIS — S90862A Insect bite (nonvenomous), left foot, initial encounter: Secondary | ICD-10-CM | POA: Diagnosis not present

## 2019-08-26 DIAGNOSIS — S30860A Insect bite (nonvenomous) of lower back and pelvis, initial encounter: Secondary | ICD-10-CM | POA: Diagnosis not present

## 2019-08-26 DIAGNOSIS — S90861A Insect bite (nonvenomous), right foot, initial encounter: Secondary | ICD-10-CM | POA: Diagnosis not present

## 2019-08-26 DIAGNOSIS — L298 Other pruritus: Secondary | ICD-10-CM | POA: Diagnosis not present

## 2019-08-26 DIAGNOSIS — S30861A Insect bite (nonvenomous) of abdominal wall, initial encounter: Secondary | ICD-10-CM | POA: Diagnosis not present

## 2019-08-28 ENCOUNTER — Encounter: Payer: Self-pay | Admitting: Internal Medicine

## 2019-08-28 DIAGNOSIS — R202 Paresthesia of skin: Secondary | ICD-10-CM | POA: Insufficient documentation

## 2019-08-28 DIAGNOSIS — R251 Tremor, unspecified: Secondary | ICD-10-CM

## 2019-08-28 DIAGNOSIS — R739 Hyperglycemia, unspecified: Secondary | ICD-10-CM

## 2019-08-28 HISTORY — DX: Tremor, unspecified: R25.1

## 2019-08-28 HISTORY — DX: Hyperglycemia, unspecified: R73.9

## 2019-08-28 HISTORY — DX: Paresthesia of skin: R20.2

## 2019-08-28 NOTE — Assessment & Plan Note (Signed)
Low carb diet and exercise.  Follow met b and a1c.

## 2019-08-28 NOTE — Assessment & Plan Note (Signed)
Issues with short term memory and forgetfulness.  Has appt scheduled with neuropsych for evaluation.  Further w/up pending their assessment.

## 2019-08-28 NOTE — Assessment & Plan Note (Signed)
History of covid infection.  Residual sob as outlined.  CT chest ok.  PFTs ok.  Continue advair.  Has albuterol to use prn.  Continue to work on Comptroller.

## 2019-08-28 NOTE — Assessment & Plan Note (Addendum)
Increased fatigue.  Persistent.  S/p covid.  Request to have thyroid rechecked.  Check routine labs.

## 2019-08-28 NOTE — Assessment & Plan Note (Signed)
Has noticed tingling scalp.  May only last 1-3 minutes.  Also some occasional tingling in her extremities.  Discussed further neurological w/up.  Keep scheduled appt.

## 2019-08-28 NOTE — Assessment & Plan Note (Signed)
Off cholesterol medication.  Low cholesterol diet and exercise.  Follow lipid panel.  

## 2019-08-28 NOTE — Assessment & Plan Note (Signed)
Off tapazole.  Check tsh.  On synthroid.

## 2019-08-28 NOTE — Assessment & Plan Note (Signed)
Increased stress with current medical issues, etc.  Experiencing some panic attacks.  Seeing psychiatry.

## 2019-08-28 NOTE — Assessment & Plan Note (Signed)
Tremors as outlined.  Neurology evaluation.

## 2019-09-01 ENCOUNTER — Encounter: Payer: Self-pay | Admitting: Psychology

## 2019-09-01 ENCOUNTER — Ambulatory Visit (INDEPENDENT_AMBULATORY_CARE_PROVIDER_SITE_OTHER): Payer: 59 | Admitting: Psychology

## 2019-09-01 ENCOUNTER — Ambulatory Visit: Payer: 59 | Admitting: Psychology

## 2019-09-01 ENCOUNTER — Other Ambulatory Visit: Payer: Self-pay

## 2019-09-01 DIAGNOSIS — R4189 Other symptoms and signs involving cognitive functions and awareness: Secondary | ICD-10-CM

## 2019-09-01 DIAGNOSIS — F331 Major depressive disorder, recurrent, moderate: Secondary | ICD-10-CM | POA: Diagnosis not present

## 2019-09-01 DIAGNOSIS — F411 Generalized anxiety disorder: Secondary | ICD-10-CM

## 2019-09-01 DIAGNOSIS — R69 Illness, unspecified: Secondary | ICD-10-CM | POA: Diagnosis not present

## 2019-09-01 DIAGNOSIS — Z8616 Personal history of COVID-19: Secondary | ICD-10-CM

## 2019-09-01 DIAGNOSIS — M25569 Pain in unspecified knee: Secondary | ICD-10-CM | POA: Insufficient documentation

## 2019-09-01 DIAGNOSIS — M545 Low back pain, unspecified: Secondary | ICD-10-CM | POA: Insufficient documentation

## 2019-09-01 DIAGNOSIS — M6750 Plica syndrome, unspecified knee: Secondary | ICD-10-CM | POA: Insufficient documentation

## 2019-09-01 DIAGNOSIS — M719 Bursopathy, unspecified: Secondary | ICD-10-CM | POA: Insufficient documentation

## 2019-09-01 NOTE — Progress Notes (Signed)
NEUROPSYCHOLOGICAL EVALUATION [name] [place] [facility] Neurology  Date of Evaluation: September 01, 2019  Reason for Referral:   Amber Richardson is a 64 y.o. right-handed Caucasian female referred by Amber Richardson, D.O., to characterize her current cognitive functioning and assist with diagnostic clarity and treatment planning in the context of subjective cognitive decline following exposure to the COVID-19 virus.   Assessment and Plan:   Clinical Impression(s): Amber Richardson' pattern of performance is suggestive of an isolated weakness across verbal fluency, as well as some performance variability across executive functioning. Performance across domains of processing speed, attention/concentration, receptive language, confrontation naming, visuospatial abilities, and learning and memory were within appropriate normative ranges. While scores in the average range could represent a very subtle decline from a previously higher level of functioning, there is no testing available for comparison purposes and this could merely represent longstanding strengths and weaknesses or normal intraindividual variability. Performances could also be impacted by ongoing psychiatric distress and sleep dysfunction (see below). Amber Richardson denied difficulties completing instrumental activities of daily living (ADLs) independently.  Across mood-related questionnaires, she reported moderate symptoms of both anxiety and depression. She also reported moderate sleep dysfunction and preoccupation with somatic (i.e., physical) symptoms. The effects of psychiatric distress on cognition has been well documented and represents the most likely cause for ongoing relative weaknesses described above. Novel sleep dysfunction as well as somatic preoccupation would also influence cognitive inefficiency and exacerbate ongoing deficits. The extent of the role that COVID-19 may play in these weaknesses is unclear. It  is possible that these changes are directly related to COVID-19 due to mechanisms (e.g., prolonged diminished oxygen saturation). However, there is no data to support this speculation due to the lack of longitudinal cognitive data and her pattern of weaknesses are consistent with what would be expected in an individual with significant mood and sleep dysfunction. Continued medical monitoring will be important moving forward.  Recommendations: No neuroimaging was available for review since 2017. Updated neuroimaging would be beneficial to assess for any anatomical correlates for cognitive weaknesses or change since contracting the COVID-19 illness.   [name] reported scalp tingling which will radiate to her arms and legs, as well as the presence of tremors while performing fine motor tasks. She is encouraged to discuss these symptoms with her PCP, as well as the pros and cons of a referral for a neurological exam and/or EMG study. I will defer to her PCP for decision making in this regard.   A combination of medication and psychotherapy has been shown to be most effective at treating symptoms of anxiety and depression. As such, Amber Richardson is encouraged to speak with her prescribing physician regarding medication adjustments to optimally manage these symptoms. Likewise, Amber Richardson is encouraged to consider engaging in short-term psychotherapy to address symptoms of psychiatric distress. She would benefit from an active and collaborative therapeutic environment, rather than one purely supportive in nature. Recommended treatment modalities include Cognitive Behavioral Therapy (CBT) or Acceptance and Commitment Therapy (ACT).  She is also encouraged to speak with her PCP and/or psychiatrist about ways to improve ongoing sleep quality. Good sleep hygiene practices can also be beneficial. These may include:   -Going to bed and waking up at the same time each day   -Regular use of relaxation strategies  before bed   -Doing a quiet activity until drowsy if unable to fall asleep within 20 minutes   -Use of bed only for sleeping (i.e., no television  watching or other activities)    -Developing relaxing night time rituals   -Creating a good sleeping environment (e.g., dark enough, quiet enough, right temperature)   -Engaging in regular exercise   -Avoiding naps   -Avoiding clock watching   -Avoiding alcohol, caffeine, and nicotine  Amber Richardson is encouraged to attend to lifestyle factors for brain health (e.g., regular physical exercise, good nutrition habits, regular participation in cognitively-stimulating activities, and general stress management techniques), which are likely to have benefits for both emotional adjustment and cognition. In fact, in addition to promoting good general health, regular exercise incorporating aerobic activities (e.g., brisk walking, jogging, cycling, etc.) has been demonstrated to be a very effective treatment for depression and stress, with similar efficacy rates to both antidepressant medication and psychotherapy. Optimal control of vascular risk factors (including safe cardiovascular exercise and adherence to dietary recommendations) is encouraged.   If interested, there are some activities which have therapeutic value and can be useful in keeping her cognitively stimulated. For suggestions, Amber Richardson is encouraged to go to the following website: https://www.[facility].org/get-to-know-barrow/centers-programs/neurorehabilitation-center/neuro-rehab-apps-and-games/ which has options, categorized by level of difficulty. It should be noted that these activities should not be viewed as a substitute for therapy.  When learning new information, she would benefit from information being broken up into small, manageable pieces. She may also find it helpful to articulate the material in her own words and in a context to promote encoding at the onset of a new task. This material  may need to be repeated multiple times to promote encoding.  Memory can be improved using internal strategies such as rehearsal, repetition, chunking, mnemonics, association, and imagery. External strategies such as written notes in a consistently used memory journal, visual and nonverbal auditory cues such as a calendar on the refrigerator or appointments with alarm, such as on a cell phone, can also help maximize recall.    To address problems with processing speed, she may wish to consider:   -Ensuring that she is alerted when essential material or instructions are being presented   -Adjusting the speed at which new information is presented   -Allowing for more time in comprehending, processing, and responding in conversation  To address problems with fluctuating attention, she may wish to consider:   -Avoiding external distractions when needing to concentrate   -Limiting exposure to fast paced environments with multiple sensory demands   -Writing down complicated information and using checklists   -Attempting and completing one task at a time (i.e., no multi-tasking)   -Verbalizing aloud each step of a task to maintain focus   -Reducing the amount of information considered at one time  Review of Records:   Ms. Pell was seen by [facility] Amber Richardson, D.O.) on 07/06/2019 for follow-up of shortness of breath post COVID. She was diagnosed with having COVID-19 on 02/22/2019. Prominent symptoms included severe headache, severe cough leading to shortness of breath from coughing episodes, fatigue, rhinorrhea, congestion, and chest pressure. She was treated with doxycycline, a prednisone taper, opiate cough syrup, and albuterol in early February. The prednisone was said to have helped, but significant benefit from albuterol was not noted. Symptoms were said to have worsened after stopping her steroid taper. She has persistent symptoms of ongoing weakness and fatigue, significant activity  limitation, dyspnea that also limits her activities, and dry coughing throughout the day. She has some clear sputum production and frequently clears her throat. She also has had sneezing episodes associated with rhinorrhea and congestion. However, since  starting Advair, her symptoms were said to have improved. She has been working on increasing her exercise tolerance with walking. While she has been doing better overall, she does not feel that she is back to her baseline. Walking up stairs and other physical exertion such as taking a shower still significantly deplete her. She has not yet returned to work. This is partly due to expressed memory concerns which have become more noticeable over the last six weeks. For example, she has left the stove on when cooking and has left water running. As she works as a Software engineer, she expressed concern that she would be too fatigued and not cognitively able to handle her work at the present time. Ultimately, Ms. Hutzler was referred for a comprehensive neuropsychological evaluation to characterize her cognitive abilities and to assist with diagnostic clarity and treatment planning.   She was also seen by [facility] Einar Pheasant, M.D.) on 08/25/2019. In addition to what is described above, Ms. Bisher reported issues with increased anxiety and panic attacks, as well as bilateral scalp tingling (sometimes involving her arms and legs) and tremors which have affected typing. Regarding the former, she currently is seen by Psychiatry.   Brain MRI on 07/11/2015 in the context of left facial numbness was unremarkable. More recent neuroimaging was unavailable for review.   Past Medical History:  Diagnosis Date  . Allergies   . Basal cell carcinoma   . Fatigue 08/30/2014  . Generalized anxiety disorder 03/21/2011  . GERD (gastroesophageal reflux disease)   . History of COVID-19 02/22/2019  . HLD (hyperlipidemia)   . 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  Hypercholesterolemia 02/08/2015  . Hyperglycemia 08/28/2019  . Hyperthyroidism   . Osteoporosis 05/13/2015  . Pyelonephritis 1985  . Scalp, arm, and leg tingling 08/28/2019  . Tendinosis 09/12/2013   Rotator cuff tendinosis. Mild capsulitis.  . Tremor 08/28/2019   Subtle to mild, upper extremities, left worse than right    Past Surgical History:  Procedure Laterality Date  . DILATION AND CURETTAGE OF UTERUS    . Miscarriage     x1  . TONSILLECTOMY  1963    Current Outpatient Medications:  .  albuterol (VENTOLIN HFA) 108 (90 Base) MCG/ACT inhaler, TAKE 2 PUFFS BY MOUTH EVERY 6 HOURS AS NEEDED FOR WHEEZE OR SHORTNESS OF BREATH, Disp: 18 g, Rfl: 1 .  ALPRAZolam (XANAX) 0.5 MG tablet, Take by mouth., Disp: , Rfl:  .  Calcium Citrate-Vitamin D (CITRACAL + D PO), Take by mouth. Take one in the morning & 2 in the afternoon, Disp: , Rfl:  .  DULoxetine HCl 40 MG CPEP, TAKE 1 ORAL CAPSULE DAILY, Disp: , Rfl: 1 .  fluticasone (FLONASE) 50 MCG/ACT nasal spray, SPRAY 2 SPRAYS INTO EACH NOSTRIL EVERY DAY, Disp: 16 mL, Rfl: 1 .  Fluticasone-Salmeterol (ADVAIR DISKUS) 250-50 MCG/DOSE AEPB, Inhale 1 puff into the lungs 2 (two) times daily., Disp: 60 each, Rfl: 5 .  ibuprofen (ADVIL,MOTRIN) 600 MG tablet, Take 600 mg by mouth as needed for pain., Disp: , Rfl:  .  ipratropium (ATROVENT) 0.03 % nasal spray, PLACE 2 SPRAYS INTO BOTH NOSTRILS EVERY 12 (TWELVE) HOURS., Disp: 30 mL, Rfl: 1 .  levothyroxine (SYNTHROID) 75 MCG tablet, TAKE 1 TABLET (75 MCG TOTAL) BY MOUTH DAILY BEFORE BREAKFAST., Disp: 90 tablet, Rfl: 1 .  Multiple Vitamin (MULTIVITAMIN) tablet, Take 1 tablet by mouth daily., Disp: , Rfl:  .  nystatin (MYCOSTATIN) 100000 UNIT/ML suspension, Take 5 mLs (500,000 Units total) by mouth 2 (  two) times daily., Disp: 120 mL, Rfl: 0 .  Respiratory Therapy Supplies (FLUTTER) DEVI, 1 each by Does not apply route 2 (two) times daily., Disp: 1 each, Rfl: 0 .  valACYclovir (VALTREX) 500 MG tablet, Take one  to two tablets by mouth daily, Disp: , Rfl:   Clinical Interview:   Cognitive Symptoms: Decreased short-term memory: Endorsed. She reported largely generalized symptoms of short-term memory loss, as well as frequently losing her train of thought. She did report a few instances where she has been cooking food on the stove and forgot about it, leading to the food being burnt. She also reported turning on the water, leaving the room, and forgetting that she had turned it on. These latter instances where said to occur rarely but "enough to scare me."  Decreased long-term memory: Denied. Decreased attention/concentration: Endorsed. While trouble with sustained attention was not endorsed, she did describe increased distractibility. She also reported hesitation after getting distracted where she is unable to recall what she was previously doing or how far along she was.  Reduced processing speed: Endorsed. Difficulties with executive functions: Endorsed. She primarily described difficulties with multi-tasking. Deficits in short-term memory and increased distractibility were said to influence these difficulties and she can no longer balance multiple things at the same time. This has to led to anxiety surrounding how she would perform her job as a Software engineer. She was unsure regarding difficulties with organization or decisiveness, stating that she has not had an opportunity "to test" these abilities lately. Impulsivity was denied. However, she did state that her "filter is gone" and that she no longer has a gut reaction or feels the consequences of decisions like she used to.  Difficulties with emotion regulation: Denied. Difficulties with receptive language: Denied assuming she can hear the source of the sound adequately.  Difficulties with word finding: Endorsed. She also reported instances where she will unintentionally re-arrange words while speaking (i.e., speaking the third word in a sentence second).   Decreased visuoperceptual ability: Denied.  Trajectory of deficits: Cognitive deficits were said to first emerge after contracting COVID-19 in January 2021. Since that time, symptoms have persisted and become more noticeable.   Difficulties completing ADLs: Denied. However, she did describe occasional confusion surrounding medication management where she has almost taken her evening doses in the morning.   Additional Medical History: History of traumatic brain injury/concussion: Denied. History of stroke: Denied. History of seizure activity: Denied. History of known exposure to toxins: Denied. Symptoms of chronic pain: Denied. However, medical records suggest several areas of reported pain including abdominal, calf, chest, foot, neck, lower back, and pelvic.  Experience of frequent headaches/migraines: Denied. Mild headache symptoms were said to occur "occasionally." However, this was said to represent a change from never experiencing these symptoms prior to having COVID-19.  Frequent instances of dizziness/vertigo: Denied.  Sensory changes: She wears glasses with positive effect. She also utilizes hearing aids bilaterally. Other sensory changes/difficulties (e.g., taste or smell) were denied.  Balance/coordination difficulties: Endorsed. Specifically, she reported commonly hitting door frames with the right side of her body while ambulating. She denied frank balance instability or a history of falls.  Other motor difficulties: Endorsed. She reported the presence of subtle to mild tremulous behaviors when performing fine motor tasks (e.g., when holding a cup of coffee or attempting to type on a keyboard), left worse than right. She also reported scalp tinging which will sometimes affect both her arms and legs. Tingling sensations were said to occur 2-3 times  per day. Both tremor and tingling symptoms were said to emerge following her receiving the COVID-19 vaccination in April 2021.  Sleep  History: Estimated hours obtained each night: Unsure. She reported generally being in bed for 8-9 hours per night but that her sleep is poor and broken in nature.  Difficulties falling asleep: Endorsed "occasionally."  Difficulties staying asleep: Endorsed. She reported frequently waking up throughout the night, generally for unknown reasons. She also reported difficulties falling back asleep, causing her obtained sleep to be broken in nature. This was said to be a novel symptom rather than chronic dysfunction.  Feels rested and refreshed upon awakening: Denied.  History of snoring: Endorsed. History of waking up gasping for air: Denied. Witnessed breath cessation while asleep: Denied.  History of vivid dreaming: Denied. Excessive movement while asleep: Denied. Instances of acting out her dreams: Denied. However, she did report her husband commenting that she had been "running" in her sleep. However, this comment has only been made once.   Psychiatric/Behavioral Health History: Depression: Acutely, she described herself "wavering on the depression side," referring to the presence of some symptoms of unknown severity. She reported that her father passed away in early 04-22-19 due to the same COVID-19 exposure which she dealt with. She also described significant sources of psychosocial stress regarding her prolonged recovery efforts and concerns surrounding her ability to return to work. She did not endorse a longstanding history of depression. She works with a Teacher, music and current symptoms are trying to be managed with medications. She has previously worked with in Haematologist but did not have a positive experience. Current or remote suicidal ideation, intent, or plan was denied.  Anxiety: She reported a longstanding history of generalized anxiety symptoms pre-dating her COVID-19 exposure. However, these symptoms were said to have become notably worsened since her exposure and prolonged  recovery. Symptoms are currently being managed via medications.  Mania: Denied. Trauma History: Denied. Visual/auditory hallucinations: Denied. Delusional thoughts: Denied.  Tobacco: Denied. Alcohol: She reported infrequent and minimal alcohol consumption currently. A history of problematic alcohol abuse or dependence was denied.  Recreational drugs: Denied. Caffeine: One cup of coffee in the morning, as well as a Pepsi beverage during the day.   Family History: Problem Relation Age of Onset  . Hypertension Mother   . Hypercholesterolemia Mother   . Heart disease Father   . Hyperlipidemia Father   . Hypercholesterolemia Father   . Stroke Father   . Dementia Father        Vascular dementia related to stroke history   This information was confirmed by Ms. Ell.  Academic/Vocational History: Highest level of educational attainment: 16 years. She was the valedictorian of her high school class. She earned a Dietitian in pharmacy and described herself as a very strong (mostly A) student in academic settings. No relative weaknesses were identified.  History of developmental delay: Denied. History of grade repetition: Denied. Enrollment in special education courses: Denied. History of LD/ADHD: Denied.  Employment: She was employed full-time as a Academic librarian prior to Curator. However she has been on disability since that time and has yet to return to work. She expressed her and her doctors having concerns surrounding her ability to return to work given her reported deficits with short-term memory, distractibility, and multi-tasking.   Evaluation Results:   Behavioral Observations: Ms. Coleson was unaccompanied, arrived to her appointment on time, and was appropriately dressed and groomed. She appeared alert and oriented. Observed gait and station were  within normal limits. Gross motor functioning appeared intact upon informal observation and no abnormal movements  (e.g., tremors) were noted during interview or testing. Her affect was generally relaxed and positive, but did range appropriately given the subject being discussed during the clinical interview or the task at hand during testing procedures. Spontaneous speech was fluent and word finding difficulties were not observed during the clinical interview. Thought processes were coherent, organized, and normal in content. Insight into her cognitive difficulties appeared adequate. During testing, sustained attention was appropriate. Task engagement was adequate and she persisted when challenged. Overall, Ms. Radcliffe was cooperative with the clinical interview and subsequent testing procedures.   Adequacy of Effort: The validity of neuropsychological testing is limited by the extent to which the individual being tested may be assumed to have exerted adequate effort during testing. Ms. Stotts expressed her intention to perform to the best of her abilities and exhibited adequate task engagement and persistence. Scores across stand-alone and embedded performance validity measures were within expectation. As such, the results of the current evaluation are believed to be a valid representation of Ms. Colgate' current cognitive functioning.  Test Results: Ms. Olheiser was fully oriented at the time of the current evaluation.  Intellectual abilities based upon educational and vocational attainment were estimated to be in the above average range. Premorbid abilities were estimated to be within the above average range based upon a single-word reading test.   Processing speed was average to above average. Basic attention was average. More complex attention (e.g., working memory) was also average. Executive functioning was somewhat variable, ranging from the well below average to average normative ranges.  Assessed receptive language abilities were average. Likewise, Ms. Hobbins did not exhibit any difficulties comprehending  task instructions and answered all questions asked of her appropriately. Assessed expressive language was variable. Phonemic fluency was exceptionally low to below average, semantic fluency was below average to average, and confrontation naming was average.     Assessed visuospatial/visuoconstructional abilities were above average.    Learning (i.e., encoding) of novel verbal and visual information was average. Spontaneous delayed recall (i.e., retrieval) of previously learned information was average to above average. Retention rates were 79% across a story learning task, 100% across a list learning task, and 86% across a shape learning task. Performance across recognition tasks was average to above average, suggesting evidence for information consolidation.   Results of emotional screening instruments suggested that recent symptoms of generalized anxiety were in the moderate range, while symptoms of depression were also within the moderate range. A screening instrument assessing recent sleep quality suggested the presence of moderate sleep dysfunction. A screening instrument assessing somatic complaints suggested the presence of moderate somatic preoccupation.  Tables of Scores:   Note: This summary of test scores accompanies the interpretive report and should not be considered in isolation without reference to the appropriate sections in the text. Descriptors are based on appropriate normative data and may be adjusted based on clinical judgment. The terms "impaired" and "within normal limits (WNL)" are used when a more specific level of functioning cannot be determined.       Effort Testing:   DESCRIPTOR       ACS Word Choice: --- --- Within Expectation  Dot Counting Test: --- --- Within Expectation  NAB EVI: --- --- Within Expectation  D-KEFS Color Word Effort Index: --- --- Within Expectation       Orientation:      Raw Score Percentile   NAB Orientation, Form 1  29/29 --- ---        Cognitive Screening:           Raw Score Percentile   SLUMS: 28/30 --- ---       Intellectual Functioning:           Standard Score Percentile   Test of Premorbid Functioning: 118 88 Above Average       Memory:          NAB Memory Module, Form 2: Standard Score/ T Score Percentile   Total Memory Index 98 45 Average  List Learning       Total Trials 1-3 26/36 (49) 46 Average    List B 3/12 (37) 9 Below Average    Short Delay Free Recall 9/12 (46) 34 Average    Long Delay Free Recall 9/12 (47) 38 Average    Retention Percentage 100 (53) 62 Average    Recognition Discriminability 11 (56) 73 Average  Shape Learning       Total Trials 1-3 17/27 (49) 46 Average    Delayed Recall 6/9 (50) 50 Average    Retention Percentage 86 (46) 34 Average    Recognition Discriminability 7 (50) 50 Average  Story Learning       Immediate Recall 55/80 (50) 50 Average    Delayed Recall 26/40 (46) 34 Average    Retention Percentage 79 (46) 34 Average  Daily Living Memory       Immediate Recall 46/51 (52) 58 Average    Delayed Recall 16/17 (58) 79 Above Average    Retention Percentage 94 (55) 69 Average    Recognition Hits 10/10 (59) 82 Above Average       Attention/Executive Function:          Trail Making Test (TMT): Raw Score (T Score) Percentile     Part A 32 secs.,  0 errors (48) 42 Average    Part B 84 secs.,  1 error (45) 31 Average              Scaled Score Percentile   WAIS-IV Coding: 9 37 Average       NAB Attention Module, Form 1: T Score Percentile     Digits Forward 53 62 Average    Digits Backwards 49 46 Average       D-KEFS Color-Word Interference Test: Raw Score (Scaled Score) Percentile     Color Naming 24 secs. (14) 91 Above Average    Word Reading 22 secs. (11) 63 Average    Inhibition 56 secs. (12) 75 Above Average      Total Errors 3 errors (9) 37 Average    Inhibition/Switching 72 secs. (11) 63 Average      Total Errors 4 errors (9) 37 Average       D-KEFS  Verbal Fluency Test: Raw Score (Scaled Score) Percentile     Letter Total Correct 21 (6) 9 Below Average    Category Total Correct 27 (7) 16 Below Average    Category Switching Total Correct 8 (5) 5 Well Below Average    Category Switching Accuracy 6 (5) 5 Well Below Average      Total Set Loss Errors 0 (13) 84 Above Average      Total Repetition Errors 6 (7) 16 Below Average       D-KEFS 20 Questions Test: Scaled Score Percentile     Total Weighted Achievement Score 14 91 Above Average    Initial Abstraction Score 7 16 Below Average  Rutledge Card Sorting Test: Raw Score Percentile     Categories (trials) 5 (64) >16 Within Normal Limits    Total Errors 12 73 Average    Perseverative Errors 7 42 Average    Non-Perseverative Errors 5 54 Average    Failure to Maintain Set 0 --- ---       Language:          Verbal Fluency Test: Raw Score (T Score) Percentile     Phonemic Fluency (FAS) 21 (27) 1 Exceptionally Low    Animal Fluency 17 (43) 25 Average        NAB Language Module, Form 1: T Score Percentile     Auditory Comprehension 56 73 Average    Naming 31/31 (55) 69 Average       Visuospatial/Visuoconstruction:      Raw Score Percentile   Clock Drawing: 10/10 --- Within Normal Limits       NAB Spatial Module, Form 1: T Score Percentile     Figure Drawing Copy 59 82 Above Average    Figure Drawing Immediate Recall 62 88 Above Average        Scaled Score Percentile   WAIS-IV Block Design: 12 75 Above Average  WAIS-IV Matrix Reasoning: 7 16 Below Average       Mood and Personality:      Raw Score Percentile   Beck Depression Inventory - II: 22 --- Moderate  PROMIS Anxiety Questionnaire: 26 --- Moderate       Additional Questionnaires:      Raw Score Percentile   PHQ-15: 10 --- Moderate  PROMIS Sleep Disturbance Questionnaire: 32 --- Moderate   Informed Consent and Coding/Compliance:   Ms. Winterhalter was provided with a verbal description of the nature and purpose  of the present neuropsychological evaluation. Also reviewed were the foreseeable risks and/or discomforts and benefits of the procedure, limits of confidentiality, and mandatory reporting requirements of this provider. The patient was given the opportunity to ask questions and receive answers about the evaluation. Oral consent to participate was provided by the patient.   This evaluation was conducted by Christia Reading, Ph.D., licensed clinical neuropsychologist. Ms. Theda Sers completed a comprehensive clinical interview with Dr. Melvyn Novas, billed as one unit [phone], and 160 minutes of cognitive testing and scoring, billed as one unit [phone] and four additional units [id number]. Psychometrist Milana Kidney, B.S., assisted Dr. Melvyn Novas with test administration and scoring procedures. As a separate and discrete service, Dr. Melvyn Novas spent a total of 120 minutes in interpretation and report writing billed as one unit [phone] and one unit [phone].

## 2019-09-01 NOTE — Progress Notes (Signed)
   Psychometrician Note   Cognitive testing was administered to [employer] by Milana Kidney, B.S. (psychometrist) under the supervision of Dr. Christia Reading, Ph.D., licensed psychologist on 09/01/19. Ms. Rhody did not appear overtly distressed by the testing session per behavioral observation or responses across self-report questionnaires. Dr. Christia Reading, Ph.D. checked in with Ms. Storlie as needed to manage any distress related to testing procedures (if applicable). Rest breaks were offered.    The battery of tests administered was selected by Dr. Christia Reading, Ph.D. with consideration to Ms. Librizzi's current level of functioning, the nature of her symptoms, emotional and behavioral responses during interview, level of literacy, observed level of motivation/effort, and the nature of the referral question. This battery was communicated to the psychometrist. Communication between Dr. Christia Reading, Ph.D. and the psychometrist was ongoing throughout the evaluation and Dr. Christia Reading, Ph.D. was immediately accessible at all times. Dr. Christia Reading, Ph.D. provided supervision to the psychometrist on the date of this service to the extent necessary to assure the quality of all services provided.    [name] will return within approximately 1-2 weeks for an interactive feedback session with Dr. Melvyn Novas at which time her test performances, clinical impressions, and treatment recommendations will be reviewed in detail. Ms. Tamura understands she can contact our office should she require our assistance before this time.  A total of 160 minutes of billable time were spent face-to-face with Ms. Pinnix by the psychometrist. This includes both test administration and scoring time. Billing for these services is reflected in the clinical report generated by Dr. Christia Reading, Ph.D..  This note reflects time spent with the psychometrician and does not include test scores or any  clinical interpretations made by Dr. Melvyn Novas. The full report will follow in a separate note.

## 2019-09-02 ENCOUNTER — Other Ambulatory Visit: Payer: 59

## 2019-09-02 ENCOUNTER — Encounter: Payer: Self-pay | Admitting: Psychology

## 2019-09-02 DIAGNOSIS — F329 Major depressive disorder, single episode, unspecified: Secondary | ICD-10-CM | POA: Insufficient documentation

## 2019-09-02 NOTE — Patient Instructions (Signed)
Clinical Impression(s): Amber Richardson' pattern of performance is suggestive of an isolated weakness across verbal fluency, as well as some performance variability across executive functioning. Performance across domains of processing speed, attention/concentration, receptive language, confrontation naming, visuospatial abilities, and learning and memory were within appropriate normative ranges. While scores in the average range could represent a very subtle decline from a previously higher level of functioning, there is no testing available for comparison purposes and this could merely represent longstanding strengths and weaknesses or normal intraindividual variability. Performances could also be impacted by ongoing psychiatric distress and sleep dysfunction (see below). Amber Richardson denied difficulties completing instrumental activities of daily living (ADLs) independently.  Across mood-related questionnaires, she reported moderate symptoms of both anxiety and depression. She also reported moderate sleep dysfunction and preoccupation with somatic (i.e., physical) symptoms. The effects of psychiatric distress on cognition has been well documented and represents the most likely cause for ongoing relative weaknesses described above. Novel sleep dysfunction as well as somatic preoccupation would also influence cognitive inefficiency and exacerbate ongoing deficits. The extent of the role that COVID-19 may play in these weaknesses is unclear. It is possible that these changes are directly related to COVID-19 due to mechanisms (e.g., prolonged diminished oxygen saturation). However, there is no data to support this speculation due to the lack of longitudinal cognitive data and her pattern of weaknesses are consistent with what would be expected in an individual with significant mood and sleep dysfunction. Continued medical monitoring will be important moving forward.

## 2019-09-05 ENCOUNTER — Telehealth: Payer: Self-pay | Admitting: Internal Medicine

## 2019-09-05 ENCOUNTER — Other Ambulatory Visit (INDEPENDENT_AMBULATORY_CARE_PROVIDER_SITE_OTHER): Payer: 59

## 2019-09-05 ENCOUNTER — Other Ambulatory Visit: Payer: Self-pay

## 2019-09-05 DIAGNOSIS — R739 Hyperglycemia, unspecified: Secondary | ICD-10-CM

## 2019-09-05 DIAGNOSIS — E7800 Pure hypercholesterolemia, unspecified: Secondary | ICD-10-CM

## 2019-09-05 DIAGNOSIS — E0590 Thyrotoxicosis, unspecified without thyrotoxic crisis or storm: Secondary | ICD-10-CM | POA: Diagnosis not present

## 2019-09-05 DIAGNOSIS — R5383 Other fatigue: Secondary | ICD-10-CM

## 2019-09-05 LAB — CBC WITH DIFFERENTIAL/PLATELET
Basophils Absolute: 0 10*3/uL (ref 0.0–0.1)
Basophils Relative: 0.6 % (ref 0.0–3.0)
Eosinophils Absolute: 0.1 10*3/uL (ref 0.0–0.7)
Eosinophils Relative: 1 % (ref 0.0–5.0)
HCT: 38.9 % (ref 36.0–46.0)
Hemoglobin: 13 g/dL (ref 12.0–15.0)
Lymphocytes Relative: 36.7 % (ref 12.0–46.0)
Lymphs Abs: 1.9 10*3/uL (ref 0.7–4.0)
MCHC: 33.5 g/dL (ref 30.0–36.0)
MCV: 97.4 fl (ref 78.0–100.0)
Monocytes Absolute: 0.5 10*3/uL (ref 0.1–1.0)
Monocytes Relative: 10.5 % (ref 3.0–12.0)
Neutro Abs: 2.6 10*3/uL (ref 1.4–7.7)
Neutrophils Relative %: 51.2 % (ref 43.0–77.0)
Platelets: 218 10*3/uL (ref 150.0–400.0)
RBC: 3.99 Mil/uL (ref 3.87–5.11)
RDW: 13.4 % (ref 11.5–15.5)
WBC: 5.2 10*3/uL (ref 4.0–10.5)

## 2019-09-05 LAB — HEPATIC FUNCTION PANEL
ALT: 16 U/L (ref 0–35)
AST: 24 U/L (ref 0–37)
Albumin: 4.3 g/dL (ref 3.5–5.2)
Alkaline Phosphatase: 110 U/L (ref 39–117)
Bilirubin, Direct: 0.1 mg/dL (ref 0.0–0.3)
Total Bilirubin: 0.8 mg/dL (ref 0.2–1.2)
Total Protein: 6.5 g/dL (ref 6.0–8.3)

## 2019-09-05 LAB — BASIC METABOLIC PANEL
BUN: 18 mg/dL (ref 6–23)
CO2: 25 mEq/L (ref 19–32)
Calcium: 9.2 mg/dL (ref 8.4–10.5)
Chloride: 102 mEq/L (ref 96–112)
Creatinine, Ser: 1.07 mg/dL (ref 0.40–1.20)
GFR: 52.02 mL/min — ABNORMAL LOW (ref >60.00)
Glucose, Bld: 89 mg/dL (ref 70–99)
Potassium: 4.2 mEq/L (ref 3.5–5.1)
Sodium: 139 mEq/L (ref 135–145)

## 2019-09-05 LAB — LIPID PANEL
Cholesterol: 284 mg/dL — ABNORMAL HIGH (ref 0–200)
HDL: 76.8 mg/dL (ref >39.00)
LDL Cholesterol: 186 mg/dL — ABNORMAL HIGH (ref 0–99)
NonHDL: 207.64
Total CHOL/HDL Ratio: 4
Triglycerides: 107 mg/dL (ref 0.0–149.0)
VLDL: 21.4 mg/dL (ref 0.0–40.0)

## 2019-09-05 LAB — HEMOGLOBIN A1C: Hgb A1c MFr Bld: 5.7 % (ref 4.6–6.5)

## 2019-09-05 LAB — TSH: TSH: 2.5 u[IU]/mL (ref 0.35–4.50)

## 2019-09-05 NOTE — Telephone Encounter (Signed)
Pt needs a copy of covid positive test for disability purposes. Please advise

## 2019-09-05 NOTE — Telephone Encounter (Signed)
Pt is aware that results has been printed off and placed up front for pick up. Pt stated that she would be by the office tomorrow to get it.

## 2019-09-06 ENCOUNTER — Other Ambulatory Visit: Payer: Self-pay | Admitting: Internal Medicine

## 2019-09-06 DIAGNOSIS — R944 Abnormal results of kidney function studies: Secondary | ICD-10-CM

## 2019-09-06 NOTE — Progress Notes (Signed)
Order placed for f/u met b 

## 2019-09-07 ENCOUNTER — Telehealth: Payer: Self-pay | Admitting: Critical Care Medicine

## 2019-09-07 NOTE — Telephone Encounter (Signed)
ATC Patient.  LM to call back. 

## 2019-09-08 ENCOUNTER — Other Ambulatory Visit: Payer: Self-pay

## 2019-09-08 ENCOUNTER — Ambulatory Visit (INDEPENDENT_AMBULATORY_CARE_PROVIDER_SITE_OTHER): Payer: 59 | Admitting: Psychology

## 2019-09-08 DIAGNOSIS — R4189 Other symptoms and signs involving cognitive functions and awareness: Secondary | ICD-10-CM

## 2019-09-08 DIAGNOSIS — Z8616 Personal history of COVID-19: Secondary | ICD-10-CM | POA: Diagnosis not present

## 2019-09-08 NOTE — Progress Notes (Signed)
   Neuropsychology Feedback Session Amber Richardson Department of Neurology  Reason for Referral:   Amber Richardson a 64 y.o. right-handed Caucasian female referred by [name], D.O.,to characterize hercurrent cognitive functioning and assist with diagnostic clarity and treatment planning in the context of subjective cognitive decline following exposure to the COVID-19 virus.  Feedback:   Amber Richardson completed a comprehensive neuropsychological evaluation on 09/01/2019. Please refer to that encounter for the full report and recommendations. Briefly, results suggested an isolated weakness across verbal fluency, as well as some performance variability across executive functioning. Performance across domains of processing speed, attention/concentration, receptive language, confrontation naming, visuospatial abilities, and learning and memory were within appropriate normative ranges. While scores in the average range could represent a very subtle decline from a previously higher level of functioning, there is no testing available for comparison purposes and this could merely represent longstanding strengths and weaknesses or normal intraindividual variability. Performances could also be impacted by ongoing psychiatric distress and sleep dysfunction.  Amber Richardson was unaccompanied during the current telephone call. She was within her residence while I was within my office. Content of the current session focused on the results of her neuropsychological evaluation. Amber Richardson was given the opportunity to ask questions and her questions were answered. We discussed developing a gradual return to work plan should be decide to return to work. She was encouraged to reach out should additional questions arise. A copy of her report was mailed at the conclusion of the visit.      21 minutes were spent conducting the current feedback session with Amber Richardson, billed as one unit  [phone].

## 2019-09-08 NOTE — Patient Instructions (Signed)
Recommendations: No neuroimaging was available for review since 2017. Updated neuroimaging would be beneficial to assess for any anatomical correlates for cognitive weaknesses or change since contracting the COVID-19 illness.   Ms. Petrea reported scalp tingling which will radiate to her arms and legs, as well as the presence of tremors while performing fine motor tasks. She is encouraged to discuss these symptoms with her PCP, as well as the pros and cons of a referral for a neurological exam and/or EMG study. I will defer to her PCP for decision making in this regard.   A combination of medication and psychotherapy has been shown to be most effective at treating symptoms of anxiety and depression. As such, Ms. Craton is encouraged to speak with her prescribing physician regarding medication adjustments to optimally manage these symptoms. Likewise, Ms. Alewine is encouraged to consider engaging in short-term psychotherapy to address symptoms of psychiatric distress. She would benefit from an active and collaborative therapeutic environment, rather than one purely supportive in nature. Recommended treatment modalities include Cognitive Behavioral Therapy (CBT) or Acceptance and Commitment Therapy (ACT).  She is also encouraged to speak with her PCP and/or psychiatrist about ways to improve ongoing sleep quality. Good sleep hygiene practices can also be beneficial. These may include:   -Going to bed and waking up at the same time each day   -Regular use of relaxation strategies before bed   -Doing a quiet activity until drowsy if unable to fall asleep within 20 minutes   -Use of bed only for sleeping (i.e., no television watching or other activities)    -Developing relaxing night time rituals   -Creating a good sleeping environment (e.g., dark enough, quiet enough, right temperature)   -Engaging in regular exercise   -Avoiding naps   -Avoiding clock watching   -Avoiding alcohol, caffeine, and  nicotine  Ms. Stickley is encouraged to attend to lifestyle factors for brain health (e.g., regular physical exercise, good nutrition habits, regular participation in cognitively-stimulating activities, and general stress management techniques), which are likely to have benefits for both emotional adjustment and cognition. In fact, in addition to promoting good general health, regular exercise incorporating aerobic activities (e.g., brisk walking, jogging, cycling, etc.) has been demonstrated to be a very effective treatment for depression and stress, with similar efficacy rates to both antidepressant medication and psychotherapy. Optimal control of vascular risk factors (including safe cardiovascular exercise and adherence to dietary recommendations) is encouraged.   If interested, there are some activities which have therapeutic value and can be useful in keeping her cognitively stimulated. For suggestions, Ms. Friedhoff is encouraged to go to the following website: https://www.[facility].org/get-to-know-barrow/centers-programs/neurorehabilitation-center/neuro-rehab-apps-and-games/ which has options, categorized by level of difficulty. It should be noted that these activities should not be viewed as a substitute for therapy.  When learning new information, she would benefit from information being broken up into small, manageable pieces. She may also find it helpful to articulate the material in her own words and in a context to promote encoding at the onset of a new task. This material may need to be repeated multiple times to promote encoding.  Memory can be improved using internal strategies such as rehearsal, repetition, chunking, mnemonics, association, and imagery. External strategies such as written notes in a consistently used memory journal, visual and nonverbal auditory cues such as a calendar on the refrigerator or appointments with alarm, such as on a cell phone, can also help maximize recall.     To address problems with processing speed, she may  wish to consider:   -Ensuring that she is alerted when essential material or instructions are being presented   -Adjusting the speed at which new information is presented   -Allowing for more time in comprehending, processing, and responding in conversation  To address problems with fluctuating attention, she may wish to consider:   -Avoiding external distractions when needing to concentrate   -Limiting exposure to fast paced environments with multiple sensory demands   -Writing down complicated information and using checklists   -Attempting and completing one task at a time (i.e., no multi-tasking)   -Verbalizing aloud each step of a task to maintain focus   -Reducing the amount of information considered at one time

## 2019-09-08 NOTE — Telephone Encounter (Signed)
lmtcb for pt.  

## 2019-09-09 NOTE — Telephone Encounter (Signed)
LMTCB x3 for pt. We have attempted to contact pt several times with no success or call back from pt. Per triage protocol, message will be closed.   

## 2019-09-12 DIAGNOSIS — R69 Illness, unspecified: Secondary | ICD-10-CM | POA: Diagnosis not present

## 2019-09-12 DIAGNOSIS — F4323 Adjustment disorder with mixed anxiety and depressed mood: Secondary | ICD-10-CM | POA: Diagnosis not present

## 2019-09-12 DIAGNOSIS — Z598 Other problems related to housing and economic circumstances: Secondary | ICD-10-CM | POA: Diagnosis not present

## 2019-09-14 DIAGNOSIS — H1033 Unspecified acute conjunctivitis, bilateral: Secondary | ICD-10-CM | POA: Diagnosis not present

## 2019-09-20 ENCOUNTER — Other Ambulatory Visit: Payer: 59

## 2019-09-22 ENCOUNTER — Other Ambulatory Visit (INDEPENDENT_AMBULATORY_CARE_PROVIDER_SITE_OTHER): Payer: 59

## 2019-09-22 ENCOUNTER — Other Ambulatory Visit: Payer: Self-pay

## 2019-09-22 DIAGNOSIS — R944 Abnormal results of kidney function studies: Secondary | ICD-10-CM | POA: Diagnosis not present

## 2019-09-22 LAB — BASIC METABOLIC PANEL
BUN: 12 mg/dL (ref 6–23)
CO2: 30 mEq/L (ref 19–32)
Calcium: 9.4 mg/dL (ref 8.4–10.5)
Chloride: 101 mEq/L (ref 96–112)
Creatinine, Ser: 0.89 mg/dL (ref 0.40–1.20)
GFR: 64.33 mL/min (ref >60.00)
Glucose, Bld: 89 mg/dL (ref 70–99)
Potassium: 4.5 mEq/L (ref 3.5–5.1)
Sodium: 136 mEq/L (ref 135–145)

## 2019-09-23 ENCOUNTER — Encounter: Payer: Self-pay | Admitting: Internal Medicine

## 2019-09-23 ENCOUNTER — Telehealth (INDEPENDENT_AMBULATORY_CARE_PROVIDER_SITE_OTHER): Payer: 59 | Admitting: Internal Medicine

## 2019-09-23 DIAGNOSIS — Z8616 Personal history of COVID-19: Secondary | ICD-10-CM | POA: Diagnosis not present

## 2019-09-23 DIAGNOSIS — E7800 Pure hypercholesterolemia, unspecified: Secondary | ICD-10-CM

## 2019-09-23 DIAGNOSIS — R251 Tremor, unspecified: Secondary | ICD-10-CM

## 2019-09-23 DIAGNOSIS — R739 Hyperglycemia, unspecified: Secondary | ICD-10-CM | POA: Diagnosis not present

## 2019-09-23 DIAGNOSIS — R202 Paresthesia of skin: Secondary | ICD-10-CM

## 2019-09-23 NOTE — Telephone Encounter (Signed)
Discussed with pt at appt 

## 2019-09-23 NOTE — Progress Notes (Signed)
Patient ID: Amber Richardson, female   DOB: 1955/12/16, 64 y.o.   MRN: [id number]   Virtual Visit via video Note  This visit type was conducted due to national recommendations for restrictions regarding the COVID-19 pandemic (e.g. social distancing).  This format is felt to be most appropriate for this patient at this time.  All issues noted in this document were discussed and addressed.  No physical exam was performed (except for noted visual exam findings with Video Visits).   I connected with Deby Odoherty by a video enabled telemedicine application and verified that I am speaking with the correct person using two identifiers. Location patient: home Location provider: work  Persons participating in the virtual visit: patient, provider  The limitations, risks, security and privacy concerns of performing an evaluation and management service by video and the availability of in person appointments have been discussed.  It has also been discussed with the patient that there may be a patient responsible charge related to this service. The patient expressed understanding and agreed to proceed.   Reason for visit: scheduled follow up.    HPI: History of covid infection.  Had persistent sob s/p covid.  Seeing pulmonary.  Has been using advair and albuterol prn.  Breathing is overall better.  Still with sob with exertion.  She was referred to neurology - referred to characterize her cognitive functioning - post covid.  Per review of notes, he felt she had isolated weakness across verbal fluency as well as some performance variability across executive functioning.  Per report, she has trouble multitasking.  Discussed needing no interruptions in completing tasks and need for avoidance of external distractions.  Increased psychological stress.  Sleep dysfunction.  Concern regarding increased anxiety and depression related to the above.  Seeing psychiatry.  On cymbalta.  Dose recently increased to 32m - taking  692min am and 30 mg in pm.  Also, trazodone to help with sleep.  She still reports increased scalp tingling - top of head and side of cheek.  Some tingling arm, leg - left and right.  Also reports some tremors.  States she is not able to type. Difficulty performing fine motor tasks.  Also per review, recommended neuroimaging and referral to a neurologist for neurological exam and/or EMG study.  Discussed with her today. She is in agreement.  Out of work while undergoing w/up and needs to complete neurological w/up.    ROS: See pertinent positives and negatives per HPI.  Past Medical History:  Diagnosis Date  . Allergies   . Basal cell carcinoma   . Fatigue 08/30/2014  . Generalized anxiety disorder 03/21/2011  . GERD (gastroesophageal reflux disease)   . History of COVID-19 02/22/2019  . HLD (hyperlipidemia)   . 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 Hypercholesterolemia 02/08/2015  . Hyperglycemia 08/28/2019  . Hyperthyroidism   . Major depressive disorder   . Osteoporosis 05/13/2015  . Pyelonephritis 1985  . Scalp, arm, and leg tingling 08/28/2019  . Tendinosis 09/12/2013   Rotator cuff tendinosis. Mild capsulitis.  . Tremor 08/28/2019   Subtle to mild, upper extremities, left worse than right    Past Surgical History:  Procedure Laterality Date  . DILATION AND CURETTAGE OF UTERUS    . Miscarriage     x1  . TONSILLECTOMY  1963    Family History  Problem Relation Age of Onset  . Hypertension Mother   . Hypercholesterolemia Mother   . Heart disease Father   . Hyperlipidemia Father   .  Hypercholesterolemia Father   . Stroke Father   . Dementia Father        Vascular dementia related to stroke history    SOCIAL HX: reviewed.    Current Outpatient Medications:  .  albuterol (VENTOLIN HFA) 108 (90 Base) MCG/ACT inhaler, TAKE 2 PUFFS BY MOUTH EVERY 6 HOURS AS NEEDED FOR WHEEZE OR SHORTNESS OF BREATH, Disp: 18 g, Rfl: 1 .  ALPRAZolam (XANAX) 0.5 MG tablet, Take by mouth., Disp: ,  Rfl:  .  Calcium Citrate-Vitamin D (CITRACAL + D PO), Take by mouth. Take one in the morning & 2 in the afternoon, Disp: , Rfl:  .  DULoxetine HCl 40 MG CPEP, TAKE 1 ORAL CAPSULE DAILY, Disp: , Rfl: 1 .  fluticasone (FLONASE) 50 MCG/ACT nasal spray, SPRAY 2 SPRAYS INTO EACH NOSTRIL EVERY DAY, Disp: 16 mL, Rfl: 1 .  Fluticasone-Salmeterol (ADVAIR DISKUS) 250-50 MCG/DOSE AEPB, Inhale 1 puff into the lungs 2 (two) times daily., Disp: 60 each, Rfl: 5 .  ibuprofen (ADVIL,MOTRIN) 600 MG tablet, Take 600 mg by mouth as needed for pain., Disp: , Rfl:  .  ipratropium (ATROVENT) 0.03 % nasal spray, PLACE 2 SPRAYS INTO BOTH NOSTRILS EVERY 12 (TWELVE) HOURS., Disp: 30 mL, Rfl: 1 .  levothyroxine (SYNTHROID) 75 MCG tablet, TAKE 1 TABLET (75 MCG TOTAL) BY MOUTH DAILY BEFORE BREAKFAST., Disp: 90 tablet, Rfl: 1 .  Multiple Vitamin (MULTIVITAMIN) tablet, Take 1 tablet by mouth daily., Disp: , Rfl:  .  nystatin (MYCOSTATIN) 100000 UNIT/ML suspension, Take 5 mLs (500,000 Units total) by mouth 2 (two) times daily., Disp: 120 mL, Rfl: 0 .  Respiratory Therapy Supplies (FLUTTER) DEVI, 1 each by Does not apply route 2 (two) times daily., Disp: 1 each, Rfl: 0 .  valACYclovir (VALTREX) 500 MG tablet, Take one to two tablets by mouth daily, Disp: , Rfl:   EXAM:  GENERAL: alert, oriented, appears well and in no acute distress  HEENT: atraumatic, conjunttiva clear, no obvious abnormalities on inspection of external nose and ears  NECK: normal movements of the head and neck  LUNGS: on inspection no signs of respiratory distress, breathing rate appears normal, no obvious gross SOB, gasping or wheezing  CV: no obvious cyanosis  PSYCH/NEURO: pleasant and cooperative, no obvious depression or anxiety, speech and thought processing grossly intact  ASSESSMENT AND PLAN:  Discussed the following assessment and plan:  Tremor Tremors persists.  Has been an issue since covid.  Also reports the tingling scalp, face, arms and  legs.  Discussed neurology referral for further w/up and evaluation.   Scalp, arm, and leg tingling Has noticed since covid.  Refer to neurology as outlined.    Hyperglycemia Low carb diet and exercise.  Follow met b and a1c.   Hypercholesterolemia Off cholesterol medication.  Follow lipid panel.   History of COVID-19 History of covid infection.  Residual sob with exertion.  Seeing pulmonary.  Breathing better.  Also having issues with increased stress, anxiety.  Difficulty in completing task, hard to multitask and hard to complete task if interrupted, etc.  Seeing psychiatry.  Medication being adjusted.  Recommend referral to neurology for further evaluation and w/up.     Orders Placed This Encounter  Procedures  . Ambulatory referral to Neurology    Referral Priority:   Routine    Referral Type:   Consultation    Referral Reason:   Specialty Services Required    Requested Specialty:   Neurology    Number of Visits Requested:  1     I discussed the assessment and treatment plan with the patient. The patient was provided an opportunity to ask questions and all were answered. The patient agreed with the plan and demonstrated an understanding of the instructions.   The patient was advised to call back or seek an in-person evaluation if the symptoms worsen or if the condition fails to improve as anticipated.   Einar Pheasant, MD

## 2019-10-02 ENCOUNTER — Encounter: Payer: Self-pay | Admitting: Internal Medicine

## 2019-10-02 NOTE — Assessment & Plan Note (Signed)
Tremors persists.  Has been an issue since covid.  Also reports the tingling scalp, face, arms and legs.  Discussed neurology referral for further w/up and evaluation.

## 2019-10-02 NOTE — Assessment & Plan Note (Signed)
Off cholesterol medication.  Follow lipid panel.   

## 2019-10-02 NOTE — Assessment & Plan Note (Signed)
History of covid infection.  Residual sob with exertion.  Seeing pulmonary.  Breathing better.  Also having issues with increased stress, anxiety.  Difficulty in completing task, hard to multitask and hard to complete task if interrupted, etc.  Seeing psychiatry.  Medication being adjusted.  Recommend referral to neurology for further evaluation and w/up.

## 2019-10-02 NOTE — Assessment & Plan Note (Signed)
Low carb diet and exercise.  Follow met b and a1c.  

## 2019-10-02 NOTE — Assessment & Plan Note (Signed)
Has noticed since covid.  Refer to neurology as outlined.

## 2019-10-12 ENCOUNTER — Telehealth: Payer: Self-pay | Admitting: Internal Medicine

## 2019-10-12 DIAGNOSIS — R251 Tremor, unspecified: Secondary | ICD-10-CM

## 2019-10-12 DIAGNOSIS — R4189 Other symptoms and signs involving cognitive functions and awareness: Secondary | ICD-10-CM

## 2019-10-12 DIAGNOSIS — R202 Paresthesia of skin: Secondary | ICD-10-CM

## 2019-10-12 NOTE — Telephone Encounter (Signed)
Patient called in stated the referral for the neurology stated Dr.Scott  had to order the test per the neurolist.

## 2019-10-12 NOTE — Telephone Encounter (Signed)
Rejection Reason - Unapproved Service - see note Biochemist, clinical - Neurology said 1 day ago  "Spoke with pt, she hasn't had MRI of brain done yet. She will contact PCP to ask for MRI to be done. Once it's complete she will call us to let us know." [employer] - Neurology said 1 day ago  "Dr Melvyn Novas recommended a MRI of Brain we will be able to sch appt after patient has the MRI We will be able to sch with Jaffe after the MRI is done no answer " [employer] - Neurology said 8 days ago  Spoke with pt, she hasn't had MRI of brain done yet. She will contact PCP to ask for MRI to be done. Once it's complete she will call us to let us know.

## 2019-10-13 ENCOUNTER — Telehealth: Payer: Self-pay | Admitting: Internal Medicine

## 2019-10-13 NOTE — Telephone Encounter (Signed)
Patient has been notified. Amber Richardson verbalized understanding and had no further questions.

## 2019-10-13 NOTE — Addendum Note (Signed)
Addended by: Alisa Graff on: 10/13/2019 04:52 AM   Modules accepted: Orders

## 2019-10-13 NOTE — Telephone Encounter (Signed)
lft msg on pt vm to call ofc to sch MRI

## 2019-10-13 NOTE — Telephone Encounter (Signed)
See other phone message.  MRI ordered.

## 2019-10-13 NOTE — Telephone Encounter (Signed)
Order placed for MRI brain. Someone should be calling her with appt date and time.

## 2019-10-14 ENCOUNTER — Telehealth: Payer: Self-pay | Admitting: Critical Care Medicine

## 2019-10-14 DIAGNOSIS — C44622 Squamous cell carcinoma of skin of right upper limb, including shoulder: Secondary | ICD-10-CM | POA: Diagnosis not present

## 2019-10-14 DIAGNOSIS — Z1283 Encounter for screening for malignant neoplasm of skin: Secondary | ICD-10-CM | POA: Diagnosis not present

## 2019-10-14 DIAGNOSIS — Z85828 Personal history of other malignant neoplasm of skin: Secondary | ICD-10-CM | POA: Diagnosis not present

## 2019-10-14 DIAGNOSIS — D485 Neoplasm of uncertain behavior of skin: Secondary | ICD-10-CM | POA: Diagnosis not present

## 2019-10-14 NOTE — Telephone Encounter (Signed)
Called and left message on Amber Richardson's vm advising that no paperwork has been received left my fax number [phone] to re fax it -pr

## 2019-10-15 ENCOUNTER — Other Ambulatory Visit: Payer: Self-pay | Admitting: Family

## 2019-10-15 DIAGNOSIS — Z20822 Contact with and (suspected) exposure to covid-19: Secondary | ICD-10-CM

## 2019-10-20 DIAGNOSIS — F4323 Adjustment disorder with mixed anxiety and depressed mood: Secondary | ICD-10-CM | POA: Diagnosis not present

## 2019-10-20 DIAGNOSIS — R69 Illness, unspecified: Secondary | ICD-10-CM | POA: Diagnosis not present

## 2019-10-24 ENCOUNTER — Telehealth: Payer: Self-pay | Admitting: Internal Medicine

## 2019-10-24 NOTE — Telephone Encounter (Signed)
Joann from [employer] called in wanting to know about some referral for patient [phone]

## 2019-10-25 ENCOUNTER — Telehealth: Payer: Self-pay | Admitting: Critical Care Medicine

## 2019-10-25 NOTE — Telephone Encounter (Signed)
Rec'd documents from [employer] via fax. Case worker is wanting Dr. Carlis Abbott to answer questions regarding her ability to return to work. Dr. Carlis Abbott will return to the office on 11/07/2019 and I will give the form to her to complete at that time. -pr

## 2019-10-28 NOTE — Telephone Encounter (Signed)
Attempted to reach [employer]. Unable to LM

## 2019-10-30 ENCOUNTER — Ambulatory Visit
Admission: RE | Admit: 2019-10-30 | Discharge: 2019-10-30 | Disposition: A | Discharge status: Home / Self Care | Payer: 59 | Source: Ambulatory Visit | Attending: Internal Medicine | Admitting: Internal Medicine

## 2019-10-30 ENCOUNTER — Other Ambulatory Visit: Payer: Self-pay

## 2019-10-30 DIAGNOSIS — R4189 Other symptoms and signs involving cognitive functions and awareness: Secondary | ICD-10-CM

## 2019-10-30 DIAGNOSIS — R202 Paresthesia of skin: Secondary | ICD-10-CM | POA: Diagnosis not present

## 2019-10-30 DIAGNOSIS — R251 Tremor, unspecified: Secondary | ICD-10-CM | POA: Diagnosis not present

## 2019-10-30 DIAGNOSIS — R42 Dizziness and giddiness: Secondary | ICD-10-CM | POA: Diagnosis not present

## 2019-10-30 MED ORDER — GADOBUTROL 1 MMOL/ML IV SOLN
7.5000 mL | Freq: Once | INTRAVENOUS | Status: AC | PRN
  Administered 2019-10-30: 7.5 mL via INTRAVENOUS

## 2019-11-02 DIAGNOSIS — R69 Illness, unspecified: Secondary | ICD-10-CM | POA: Diagnosis not present

## 2019-11-02 DIAGNOSIS — Z733 Stress, not elsewhere classified: Secondary | ICD-10-CM | POA: Diagnosis not present

## 2019-11-02 DIAGNOSIS — F4323 Adjustment disorder with mixed anxiety and depressed mood: Secondary | ICD-10-CM | POA: Diagnosis not present

## 2019-11-07 ENCOUNTER — Other Ambulatory Visit: Payer: Self-pay

## 2019-11-07 ENCOUNTER — Ambulatory Visit (INDEPENDENT_AMBULATORY_CARE_PROVIDER_SITE_OTHER): Payer: 59 | Admitting: Critical Care Medicine

## 2019-11-07 ENCOUNTER — Encounter: Payer: Self-pay | Admitting: Critical Care Medicine

## 2019-11-07 VITALS — BP 132/80 | HR 63 | Temp 95.5°F | Ht 68.0 in | Wt 164.0 lb

## 2019-11-07 DIAGNOSIS — Z8616 Personal history of COVID-19: Secondary | ICD-10-CM | POA: Diagnosis not present

## 2019-11-07 NOTE — Progress Notes (Signed)
Synopsis: Referred in March 2021 for post Covid by Einar Pheasant, MD.  Subjective:   PATIENT ID: Amber Richardson: 1955-05-13, MRN: [id number]  Chief Complaint  Patient presents with  . Follow-up    Doing better since last visit, difficulty with exertion, pain on right side of chest for 3 days all weekend is now gone.     Mrs. Amber Richardson is a 64 y/o woman with a long haul Covid.  She presents for follow-up today.  She continues to have dyspnea on exertion, although she is feeling improved.  Her neurologic symptoms have progressed and are now her larger limitation.  She continues to use albuterol 1 puff about once per day.  She notices that she does not have to work is hard to breathe when she uses this with activity.  It is not associated with tremors.  She has not used her inhaler today.  She is having an episode of leg shaking that has been coming and going for about 20 minutes prior to me coming into the room.  She has had episodes that are similar with her hands.  These are unpredictable and are not associated with being cold.  Most of these episodes are rather short.  She still has some scalp tingling and is having significant short-term memory impairment.  When she has the tremulousness she feels like she is weak.  She has to have her husband help her walk around when she is having these episodes.  She saw Dr. Melvyn Novas from neurology who recommended neuropsychiatric testing.  She is seeing Dr. Tomi Likens in December 2021.  Previously underwent brain MRI, which was normal.  She has no family history of movement disorders.  She does not think that dose changes of Cymbalta have impacted her symptoms. She has paperwork for long-term disability coverage that needs to be completed for submission.     OV 07/06/19: Mrs. Amber Richardson is a 64 year old woman who presents for follow-up with shortness of breath post Covid.  Since starting Advair her symptoms have improved.  She has been working on  increasing her exercise tolerance with walking.  The Advair has helped more than the albuterol.  She has been doing better overall, but is not back to her baseline.  Walking up stairs and exertion such as taking a shower still significantly depletes her.  She is not yet back to work.  She is concerned about her memory issues.  This is been more noticeable in the last 6 weeks.  She has left the stove on when cooking in his left water running.  She works as a Software engineer and is concerned that she would be too fatigued and not cognitively able to handle her work.   OV 04/07/19: Mrs. Amber Richardson is a 64 year old woman who has a history of recent COVID-19 infection diagnosed on 1/26.  She had symptoms of severe headache, severe cough leading to shortness of breath from coughing episodes, fatigue, rhinorrhea, congestion, chest pressure.  She was treated with doxycycline, a prednisone taper, opiate cough syrup, and albuterol in early February.  The prednisone helped, but there is no significant benefit from albuterol at that time.  She has been using albuterol some last few weeks with severe dyspnea on exertion, and has noted some benefit recently.  Her symptoms worsened after stopping her steroid taper.  In the last few weeks her symptoms have gotten progressively worse.  She has persistent symptoms of ongoing weakness and fatigue, significant activity limitation, dyspnea that limits her activities,  dry coughing throughout the day.  She has some clear sputum production and frequently clears her throat.  She has had sneezing episodes associated with rhinorrhea and congestion, but no throat, nose, soft palate itching or itchy, watery eyes.  No postnasal drip.  No history of significant seasonal allergies.  No fever, chills, sweats.  She has no previous history of asthma or lung disease.  No family history of lung disease.  No history of smoking or vaping.  She has always been very active and healthy.  She works as a  Software engineer at [employer], but has recently not been working due to caring for with her parents while they had Covid and during her own recovery.   Past Medical History:  Diagnosis Date  . Allergies   . Basal cell carcinoma   . Fatigue 08/30/2014  . Generalized anxiety disorder 03/21/2011  . GERD (gastroesophageal reflux disease)   . History of COVID-19 02/22/2019  . HLD (hyperlipidemia)   . 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 Hypercholesterolemia 02/08/2015  . Hyperglycemia 08/28/2019  . Hyperthyroidism   . Major depressive disorder   . Osteoporosis 05/13/2015  . Pyelonephritis 1985  . Scalp, arm, and leg tingling 08/28/2019  . Tendinosis 09/12/2013   Rotator cuff tendinosis. Mild capsulitis.  . Tremor 08/28/2019   Subtle to mild, upper extremities, left worse than right     Family History  Problem Relation Age of Onset  . Hypertension Mother   . Hypercholesterolemia Mother   . Heart disease Father   . Hyperlipidemia Father   . Hypercholesterolemia Father   . Stroke Father   . Dementia Father        Vascular dementia related to stroke history     Past Surgical History:  Procedure Laterality Date  . DILATION AND CURETTAGE OF UTERUS    . Miscarriage     x1  . TONSILLECTOMY  1963    Social History   Socioeconomic History  . Marital status: Married    Spouse name: Not on file  . Number of children: 0  . Years of education: 16  . Highest education level: Bachelor's degree (e.g., BA, AB, BS)  Occupational History  . Not on file  Tobacco Use  . Smoking status: Never Smoker  . Smokeless tobacco: Never Used  Vaping Use  . Vaping Use: Never used  Substance and Sexual Activity  . Alcohol use: No    Alcohol/week: 0.0 standard drinks  . Drug use: No  . Sexual activity: Not on file  Other Topics Concern  . Not on file  Social History Narrative  . Not on file   Social Determinants of Health   Financial Resource Strain:   . Difficulty of Paying Living Expenses: Not on file   Food Insecurity:   . Worried About Charity fundraiser in the Last Year: Not on file  . Ran Out of Food in the Last Year: Not on file  Transportation Needs:   . Lack of Transportation (Medical): Not on file  . Lack of Transportation (Non-Medical): Not on file  Physical Activity:   . Days of Exercise per Week: Not on file  . Minutes of Exercise per Session: Not on file  Stress:   . Feeling of Stress : Not on file  Social Connections:   . Frequency of Communication with Friends and Family: Not on file  . Frequency of Social Gatherings with Friends and Family: Not on file  . Attends Religious Services: Not on file  .  Active Member of Clubs or Organizations: Not on file  . Attends Archivist Meetings: Not on file  . Marital Status: Not on file  Intimate Partner Violence:   . Fear of Current or Ex-Partner: Not on file  . Emotionally Abused: Not on file  . Physically Abused: Not on file  . Sexually Abused: Not on file     Allergies  Allergen Reactions  . Levaquin [Levofloxacin In D5w] Other (See Comments)    Numbness and tingling  . Sertraline Other (See Comments)    Trimmers   . Sulfa Antibiotics Rash     Immunization History  Administered Date(s) Administered  . DTaP 01/28/2011  . DTaP, 5 pertussis antigens 01/28/2011  . Influenza Inj Mdck Quad With Preservative 11/01/2017  . Influenza Split 12/05/2015  . Influenza,inj,Quad PF,6+ Mos 10/22/2018  . Influenza-Unspecified 11/01/2017  . PFIZER SARS-COV-2 Vaccination 05/25/2019, 06/15/2019    Outpatient Medications Prior to Visit  Medication Sig Dispense Refill  . albuterol (VENTOLIN HFA) 108 (90 Base) MCG/ACT inhaler TAKE 2 PUFFS BY MOUTH EVERY 6 HOURS AS NEEDED FOR WHEEZE OR SHORTNESS OF BREATH 18 each 1  . ALPRAZolam (XANAX) 0.5 MG tablet Take by mouth.    . Calcium Citrate-Vitamin D (CITRACAL + D PO) Take by mouth. Take one in the morning & 2 in the afternoon    . DULoxetine HCl 40 MG CPEP 90 mg.   1  .  fluticasone (FLONASE) 50 MCG/ACT nasal spray SPRAY 2 SPRAYS INTO EACH NOSTRIL EVERY DAY 16 mL 1  . Fluticasone-Salmeterol (ADVAIR DISKUS) 250-50 MCG/DOSE AEPB Inhale 1 puff into the lungs 2 (two) times daily. 60 each 5  . ibuprofen (ADVIL,MOTRIN) 600 MG tablet Take 600 mg by mouth as needed for pain.    Marland Kitchen ipratropium (ATROVENT) 0.03 % nasal spray PLACE 2 SPRAYS INTO BOTH NOSTRILS EVERY 12 (TWELVE) HOURS. 30 mL 1  . levothyroxine (SYNTHROID) 75 MCG tablet TAKE 1 TABLET (75 MCG TOTAL) BY MOUTH DAILY BEFORE BREAKFAST. 90 tablet 1  . Multiple Vitamin (MULTIVITAMIN) tablet Take 1 tablet by mouth daily.    Marland Kitchen nystatin (MYCOSTATIN) 100000 UNIT/ML suspension Take 5 mLs (500,000 Units total) by mouth 2 (two) times daily. 120 mL 0  . Respiratory Therapy Supplies (FLUTTER) DEVI 1 each by Does not apply route 2 (two) times daily. 1 each 0  . valACYclovir (VALTREX) 500 MG tablet Take one to two tablets by mouth daily     No facility-administered medications prior to visit.    Review of Systems  Constitutional: Negative for chills, fever and weight loss.  HENT: Positive for congestion.   Respiratory: Positive for shortness of breath. Negative for wheezing.   Cardiovascular: Negative for leg swelling.  Gastrointestinal: Negative.   Endo/Heme/Allergies: Negative for environmental allergies.     Objective:   Vitals:   11/07/19 1036  BP: 132/80  Pulse: 63  Temp: (!) 95.5 F (35.3 C)  TempSrc: Temporal  SpO2: 98%  Weight: 164 lb (74.4 kg)  Height: 5\' 8"  (1.727 m)   98% on   RA BMI Readings from Last 3 Encounters:  11/07/19 24.94 kg/m  10/30/19 24.33 kg/m  08/25/19 24.33 kg/m   Wt Readings from Last 3 Encounters:  11/07/19 164 lb (74.4 kg)  10/30/19 160 lb (72.6 kg)  08/25/19 160 lb (72.6 kg)    Physical Exam Vitals reviewed.  Constitutional:      Appearance: Normal appearance.  HENT:     Head: Normocephalic and atraumatic.  Eyes:  General: No scleral icterus. Cardiovascular:      Rate and Rhythm: Normal rate and regular rhythm.     Heart sounds: No murmur heard.   Pulmonary:     Comments: Breathing comfortably on room air, no conversational dyspnea.  Clear to auscultation bilaterally.   Abdominal:     General: There is no distension.     Palpations: Abdomen is soft.  Musculoskeletal:        General: No swelling or deformity.     Cervical back: Neck supple.  Skin:    General: Skin is warm and dry.     Findings: No rash.     Comments: No goosebumps or cool skin.  Neurological:     Mental Status: She is alert.     Comments: Symmetric leg tremulousness, bilateral lower extremities, significant portion of this is slight abduction, with some thigh muscle spasms.  This was on and off throughout the visit.  No hand tremors.  Slight voice tremor.  Psychiatric:        Mood and Affect: Mood normal.        Thought Content: Thought content normal.      CBC    Component Value Date/Time   WBC 5.2 09/05/2019 1001   RBC 3.99 09/05/2019 1001   HGB 13.0 09/05/2019 1001   HCT 38.9 09/05/2019 1001   PLT 218.0 09/05/2019 1001   MCV 97.4 09/05/2019 1001   MCHC 33.5 09/05/2019 1001   RDW 13.4 09/05/2019 1001   LYMPHSABS 1.9 09/05/2019 1001   MONOABS 0.5 09/05/2019 1001   EOSABS 0.1 09/05/2019 1001   BASOSABS 0.0 09/05/2019 1001    CHEMISTRY No results for input(s): NA, K, CL, CO2, GLUCOSE, BUN, CREATININE, CALCIUM, MG, PHOS in the last 168 hours. CrCl cannot be calculated (Patient's most recent lab result is older than the maximum 21 days allowed.).   Chest Imaging- films reviewed: CXR, 2 view 03/01/2019-mild patchy peripheral opacities bilaterally.  Scoliosis.  CXR 04/07/2019- resolution of previous peripheral opacities.  HRCT chest 05/03/2019- No significant fibrosis. Tiny area in dependent right lower lobe with GG.   Pulmonary Functions Testing Results: PFT Results Latest Ref Rng & Units 05/23/2019  FVC-Pre L 3.17  FVC-Predicted Pre % 83  FVC-Post L 3.26   FVC-Predicted Post % 86  Pre FEV1/FVC % % 81  Post FEV1/FCV % % 83  FEV1-Pre L 2.56  FEV1-Predicted Pre % 87  FEV1-Post L 2.70  DLCO uncorrected ml/min/mmHg 23.34  DLCO UNC% % 101  DLCO corrected ml/min/mmHg 23.34  DLCO COR %Predicted % 101  DLVA Predicted % 111  TLC L 5.35  TLC % Predicted % 94  RV % Predicted % 85   2021-no significant obstruction or bronchodilator reversibility.  No restriction.  Normal diffusion capacity.  Normal PFT.     Assessment & Plan:     ICD-10-CM   1. History of 2019 novel coronavirus disease (COVID-19)  Z86.16     Chronic shortness of breath and cough since COVID-19 viral pneumonia-overall improving.  No evidence of ILD or uncontrolled obstructive lung disease.  Possible mild persistent asthma as PFTs were done after starting Advair. -Continue albuterol as needed -Continue regular physical activity to maintain exercise tolerance.  I can see that her neurologic symptoms are likely her main limitation, especially with regular physical activity.    Memory impairment, tremors -Agree with ongoing neurologic work-up.  I can understand how we would be difficult to work in a job that requires fine motor skills with tremors  that are unpredictable.  Completed faxed questions from insurance company regarding disability claim.   RTC in about 3 months Dr. Erin Fulling.    Current Outpatient Medications:  .  albuterol (VENTOLIN HFA) 108 (90 Base) MCG/ACT inhaler, TAKE 2 PUFFS BY MOUTH EVERY 6 HOURS AS NEEDED FOR WHEEZE OR SHORTNESS OF BREATH, Disp: 18 each, Rfl: 1 .  ALPRAZolam (XANAX) 0.5 MG tablet, Take by mouth., Disp: , Rfl:  .  Calcium Citrate-Vitamin D (CITRACAL + D PO), Take by mouth. Take one in the morning & 2 in the afternoon, Disp: , Rfl:  .  DULoxetine HCl 40 MG CPEP, 90 mg. , Disp: , Rfl: 1 .  fluticasone (FLONASE) 50 MCG/ACT nasal spray, SPRAY 2 SPRAYS INTO EACH NOSTRIL EVERY DAY, Disp: 16 mL, Rfl: 1 .  Fluticasone-Salmeterol (ADVAIR DISKUS)  250-50 MCG/DOSE AEPB, Inhale 1 puff into the lungs 2 (two) times daily., Disp: 60 each, Rfl: 5 .  ibuprofen (ADVIL,MOTRIN) 600 MG tablet, Take 600 mg by mouth as needed for pain., Disp: , Rfl:  .  ipratropium (ATROVENT) 0.03 % nasal spray, PLACE 2 SPRAYS INTO BOTH NOSTRILS EVERY 12 (TWELVE) HOURS., Disp: 30 mL, Rfl: 1 .  levothyroxine (SYNTHROID) 75 MCG tablet, TAKE 1 TABLET (75 MCG TOTAL) BY MOUTH DAILY BEFORE BREAKFAST., Disp: 90 tablet, Rfl: 1 .  Multiple Vitamin (MULTIVITAMIN) tablet, Take 1 tablet by mouth daily., Disp: , Rfl:  .  nystatin (MYCOSTATIN) 100000 UNIT/ML suspension, Take 5 mLs (500,000 Units total) by mouth 2 (two) times daily., Disp: 120 mL, Rfl: 0 .  Respiratory Therapy Supplies (FLUTTER) DEVI, 1 each by Does not apply route 2 (two) times daily., Disp: 1 each, Rfl: 0 .  valACYclovir (VALTREX) 500 MG tablet, Take one to two tablets by mouth daily, Disp: , Rfl:      Julian Hy, DO [facility] 11/07/2019 11:07 AM

## 2019-11-07 NOTE — Patient Instructions (Addendum)
Thank you for visiting Dr. Carlis Abbott at [facility]. We recommend the following:  Return in about 3 months (around 02/07/2020). with Dr. Erin Fulling (30 minutes visit).    Please do your part to reduce the spread of COVID-19.

## 2019-11-08 ENCOUNTER — Telehealth: Payer: Self-pay | Admitting: Critical Care Medicine

## 2019-11-08 NOTE — Telephone Encounter (Signed)
Spoke with the pt  She is asking if we can mail her a copy of her disability forms  I do not see that they have been scanned in yet Dr Carlis Abbott, do you still have these forms?  Please advise, thanks!

## 2019-11-08 NOTE — Telephone Encounter (Signed)
Gave them to [facility] today after completing them last evening.  Julian Hy, DO 11/08/19 7:05 PM [facility]

## 2019-11-09 NOTE — Telephone Encounter (Signed)
Forms were put on Amber Richardson desk yesterday at end of day. Will forward to [place].

## 2019-11-09 NOTE — Telephone Encounter (Signed)
Faxed forms to [employer] at [phone]. Per patient request mailed copy to patient home. -pr

## 2019-11-10 ENCOUNTER — Other Ambulatory Visit: Payer: Self-pay

## 2019-11-10 ENCOUNTER — Ambulatory Visit (INDEPENDENT_AMBULATORY_CARE_PROVIDER_SITE_OTHER): Payer: 59 | Admitting: Internal Medicine

## 2019-11-10 ENCOUNTER — Encounter: Payer: Self-pay | Admitting: Internal Medicine

## 2019-11-10 DIAGNOSIS — Z1231 Encounter for screening mammogram for malignant neoplasm of breast: Secondary | ICD-10-CM

## 2019-11-10 DIAGNOSIS — R251 Tremor, unspecified: Secondary | ICD-10-CM | POA: Diagnosis not present

## 2019-11-10 DIAGNOSIS — R69 Illness, unspecified: Secondary | ICD-10-CM | POA: Diagnosis not present

## 2019-11-10 DIAGNOSIS — Z8616 Personal history of COVID-19: Secondary | ICD-10-CM

## 2019-11-10 DIAGNOSIS — Z23 Encounter for immunization: Secondary | ICD-10-CM | POA: Diagnosis not present

## 2019-11-10 DIAGNOSIS — E0590 Thyrotoxicosis, unspecified without thyrotoxic crisis or storm: Secondary | ICD-10-CM

## 2019-11-10 DIAGNOSIS — R202 Paresthesia of skin: Secondary | ICD-10-CM | POA: Diagnosis not present

## 2019-11-10 DIAGNOSIS — F439 Reaction to severe stress, unspecified: Secondary | ICD-10-CM

## 2019-11-10 DIAGNOSIS — E7800 Pure hypercholesterolemia, unspecified: Secondary | ICD-10-CM | POA: Diagnosis not present

## 2019-11-10 DIAGNOSIS — R739 Hyperglycemia, unspecified: Secondary | ICD-10-CM

## 2019-11-10 MED ORDER — LEVOTHYROXINE SODIUM 75 MCG PO TABS: 75.0000 ug | ORAL_TABLET | Freq: Every day | ORAL | 1 refills | Status: DC

## 2019-11-10 NOTE — Telephone Encounter (Signed)
Spoke with patient at her appt. Was advised to disregard message from [employer]

## 2019-11-10 NOTE — Progress Notes (Signed)
Patient ID: Amber Richardson, female   DOB: Jan 05, 1956, 64 y.o.   MRN: [id number]   Subjective:    Patient ID: Amber Richardson, female    DOB: [birth year], 64 y.o.   MRN: [id number]  HPI This visit occurred during the SARS-CoV-2 public health emergency.  Safety protocols were in place, including screening questions prior to the visit, additional usage of staff PPE, and extensive cleaning of exam room while observing appropriate contact time as indicated for disinfecting solutions.  Patient here for a scheduled follow up.  Recently diagnosed with covid.  Having issues with persistent post covid symptoms.  She has been followed by pulmonary for persistent sob and DOE.  Breathing is better.  Uses an albuterol inhaler prn.  No evidence of ILD or uncontrolled obstructive lung disease.  She is having intermittent tremors - can involve her extremities or her whole body.  Also experiencing scalp tingling and short term memory issues.  Saw Dr Melvyn Novas from neurology and she completed a comprehensive neuropsychological evaluation. See Dr Melvyn Novas 09/08/19 note for details.  Recommended neuroimaging.  MRI - no acute abnormality.  With the persistent tremors and tingling, a formal neurological evaluation is recommended.  She is seeing psychiatry for treatment of stress/anxiety.  On cymbalta 66m in the am and 38min the pm.  Doing well with this regimen.  Concerns regarding returning to work, given her persistent issues with focus , memory and tremors.  Waiting for neurology evaluation.  She is eating.  No nausea or vomiting.  No chest pain reported.  Bowels moving.    Past Medical History:  Diagnosis Date  . Allergies   . Basal cell carcinoma   . Fatigue 08/30/2014  . Generalized anxiety disorder 03/21/2011  . GERD (gastroesophageal reflux disease)   . History of COVID-19 02/22/2019  . HLD (hyperlipidemia)   . 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 Hypercholesterolemia 02/08/2015  . Hyperglycemia 08/28/2019  . Hyperthyroidism    . Major depressive disorder   . Osteoporosis 05/13/2015  . Pyelonephritis 1985  . Scalp, arm, and leg tingling 08/28/2019  . Tendinosis 09/12/2013   Rotator cuff tendinosis. Mild capsulitis.  . Tremor 08/28/2019   Subtle to mild, upper extremities, left worse than right   Past Surgical History:  Procedure Laterality Date  . DILATION AND CURETTAGE OF UTERUS    . Miscarriage     x1  . TONSILLECTOMY  1963   Family History  Problem Relation Age of Onset  . Hypertension Mother   . Hypercholesterolemia Mother   . Heart disease Father   . Hyperlipidemia Father   . Hypercholesterolemia Father   . Stroke Father   . Dementia Father        Vascular dementia related to stroke history   Social History   Socioeconomic History  . Marital status: Married    Spouse name: Not on file  . Number of children: 0  . Years of education: 16  . Highest education level: Bachelor's degree (e.g., BA, AB, BS)  Occupational History  . Not on file  Tobacco Use  . Smoking status: Never Smoker  . Smokeless tobacco: Never Used  Vaping Use  . Vaping Use: Never used  Substance and Sexual Activity  . Alcohol use: No    Alcohol/week: 0.0 standard drinks  . Drug use: No  . Sexual activity: Not on file  Other Topics Concern  . Not on file  Social History Narrative  . Not on file   Social Determinants of Health  Financial Resource Strain:   . Difficulty of Paying Living Expenses: Not on file  Food Insecurity:   . Worried About Charity fundraiser in the Last Year: Not on file  . Ran Out of Food in the Last Year: Not on file  Transportation Needs:   . Lack of Transportation (Medical): Not on file  . Lack of Transportation (Non-Medical): Not on file  Physical Activity:   . Days of Exercise per Week: Not on file  . Minutes of Exercise per Session: Not on file  Stress:   . Feeling of Stress : Not on file  Social Connections:   . Frequency of Communication with Friends and Family: Not on  file  . Frequency of Social Gatherings with Friends and Family: Not on file  . Attends Religious Services: Not on file  . Active Member of Clubs or Organizations: Not on file  . Attends Archivist Meetings: Not on file  . Marital Status: Not on file    Outpatient Encounter Medications as of 11/10/2019  Medication Sig  . albuterol (VENTOLIN HFA) 108 (90 Base) MCG/ACT inhaler TAKE 2 PUFFS BY MOUTH EVERY 6 HOURS AS NEEDED FOR WHEEZE OR SHORTNESS OF BREATH  . ALPRAZolam (XANAX) 0.5 MG tablet Take by mouth.  . Calcium Citrate-Vitamin D (CITRACAL + D PO) Take by mouth. Take one in the morning & 2 in the afternoon  . DULoxetine HCl 40 MG CPEP 90 mg.   . fluticasone (FLONASE) 50 MCG/ACT nasal spray SPRAY 2 SPRAYS INTO EACH NOSTRIL EVERY DAY  . Fluticasone-Salmeterol (ADVAIR DISKUS) 250-50 MCG/DOSE AEPB Inhale 1 puff into the lungs 2 (two) times daily.  Marland Kitchen ibuprofen (ADVIL,MOTRIN) 600 MG tablet Take 600 mg by mouth as needed for pain.  Marland Kitchen ipratropium (ATROVENT) 0.03 % nasal spray PLACE 2 SPRAYS INTO BOTH NOSTRILS EVERY 12 (TWELVE) HOURS.  Marland Kitchen levothyroxine (SYNTHROID) 75 MCG tablet Take 1 tablet (75 mcg total) by mouth daily before breakfast.  . Multiple Vitamin (MULTIVITAMIN) tablet Take 1 tablet by mouth daily.  Marland Kitchen nystatin (MYCOSTATIN) 100000 UNIT/ML suspension Take 5 mLs (500,000 Units total) by mouth 2 (two) times daily.  Marland Kitchen Respiratory Therapy Supplies (FLUTTER) DEVI 1 each by Does not apply route 2 (two) times daily.  . valACYclovir (VALTREX) 500 MG tablet Take one to two tablets by mouth daily  . [DISCONTINUED] levothyroxine (SYNTHROID) 75 MCG tablet TAKE 1 TABLET (75 MCG TOTAL) BY MOUTH DAILY BEFORE BREAKFAST.   No facility-administered encounter medications on file as of 11/10/2019.    Review of Systems  Constitutional: Negative for appetite change and unexpected weight change.  HENT: Negative for congestion and sinus pressure.   Respiratory: Negative for cough and chest  tightness.        Breathing has improved.  Still reports some sob with exertion.   Cardiovascular: Negative for chest pain, palpitations and leg swelling.  Gastrointestinal: Negative for abdominal pain, diarrhea, nausea and vomiting.  Genitourinary: Negative for difficulty urinating and dysuria.  Musculoskeletal: Negative for joint swelling and myalgias.  Skin: Negative for color change and rash.  Neurological: Positive for tremors. Negative for headaches.       Problems with short term memory as outlined.    Psychiatric/Behavioral: Negative for agitation and dysphoric mood.       Objective:    Physical Exam Vitals reviewed.  Constitutional:      General: She is not in acute distress. HENT:     Head: Normocephalic and atraumatic.     Right Ear:  External ear normal.  Eyes:     General: No scleral icterus.       Right eye: No discharge.        Left eye: No discharge.     Conjunctiva/sclera: Conjunctivae normal.  Neck:     Thyroid: No thyromegaly.  Cardiovascular:     Rate and Rhythm: Normal rate and regular rhythm.  Pulmonary:     Effort: No respiratory distress.     Breath sounds: Normal breath sounds. No wheezing.  Abdominal:     General: Bowel sounds are normal.     Palpations: Abdomen is soft.     Tenderness: There is no abdominal tenderness.  Musculoskeletal:        General: No swelling or tenderness.     Cervical back: Neck supple. No tenderness.  Lymphadenopathy:     Cervical: No cervical adenopathy.  Skin:    Findings: No erythema or rash.  Neurological:     Mental Status: She is alert.     Comments: FTN intact.    Psychiatric:        Mood and Affect: Mood normal.        Behavior: Behavior normal.     BP 118/70   Pulse 83   Temp 97.9 F (36.6 C) (Oral)   Resp 16   Ht '5\' 8"'  (1.727 m)   Wt 164 lb (74.4 kg)   LMP 01/13/2008   SpO2 98%   BMI 24.94 kg/m  Wt Readings from Last 3 Encounters:  11/10/19 164 lb (74.4 kg)  11/07/19 164 lb (74.4 kg)   10/30/19 160 lb (72.6 kg)     Lab Results  Component Value Date   WBC 5.2 09/05/2019   HGB 13.0 09/05/2019   HCT 38.9 09/05/2019   PLT 218.0 09/05/2019   GLUCOSE 89 09/22/2019   CHOL 284 (H) 09/05/2019   TRIG 107.0 09/05/2019   HDL 76.80 09/05/2019   LDLDIRECT 165.6 03/23/2012   LDLCALC 186 (H) 09/05/2019   ALT 16 09/05/2019   AST 24 09/05/2019   NA 136 09/22/2019   K 4.5 09/22/2019   CL 101 09/22/2019   CREATININE 0.89 09/22/2019   BUN 12 09/22/2019   CO2 30 09/22/2019   TSH 2.50 09/05/2019   HGBA1C 5.7 09/05/2019    MR Brain W Wo Contrast  Result Date: 10/30/2019 CLINICAL DATA:  Tingling sensation in the scalp, face, arms and legs with upper extremity tremors and dizziness. Symptoms following COVID-19 infection in 01/2019. EXAM: MRI HEAD WITHOUT AND WITH CONTRAST TECHNIQUE: Multiplanar, multiecho pulse sequences of the brain and surrounding structures were obtained without and with intravenous contrast. CONTRAST:  7.58m GADAVIST GADOBUTROL 1 MMOL/ML IV SOLN COMPARISON:  07/11/2015 FINDINGS: Brain: There is no evidence of an acute infarct, intracranial hemorrhage, mass, midline shift, or extra-axial fluid collection. The ventricles and sulci are normal. No significant white matter disease is seen for age. No abnormal enhancement is identified. Vascular: Major intracranial vascular flow voids are preserved. Normal enhancement of the dural venous sinuses. Skull and upper cervical spine: Unremarkable bone marrow signal. Sinuses/Orbits: Unremarkable orbits. Paranasal sinuses and mastoid air cells are clear. Other: None. IMPRESSION: Unremarkable appearance of the brain. Electronically Signed   By: ALogan BoresM.D.   On: 10/30/2019 10:43       Assessment & Plan:   Problem List Items Addressed This Visit    Tremor    Persistent intermittent tremors noted post covid.  MRI revealed no acute abnormality.  Has appt scheduled with neurology  for further evaluation.        Stress     Increased stress as outlined.  Seeing psychiatry. On cymalta - taking 66m in am and 335min pm. Doing better on this regimen.  Follow.       Scalp, arm, and leg tingling    Has been an issue since covid.  MRI no acute abnormality.  Has an appt scheduled with neurology for formal evaluation.        Hyperthyroidism    Off tapazole.  On synthroid now.  Follow tsh.       Relevant Medications   levothyroxine (SYNTHROID) 75 MCG tablet   Hyperglycemia    Low carb diet and exercise.  Follow met b and a1c.       Relevant Orders   Hemoglobin A1c   Hypercholesterolemia    Off cholesterol medication.  Low cholesterol diet and exercise. Follow lipid panel.       Relevant Orders   Hepatic function panel   Lipid panel   Basic metabolic panel   History of COVID-19    Since covid, has had persistent sob and DOE.  Breathing is better now.  No evidence of ILD.  Uses albuterol prn. She has also had persistent problems with intermittent tremors, scalp tingling and trouble focusing, concentrating and short term memory issues.  Had formal neuropsych evaluation as outlined.  MRI revealed no acute abnormality.  Has appt scheduled with neurology for formal neurological evaluation.        Breast cancer screening    Order placed for mammogram.  She will schedule.        Relevant Orders   MM 3D SCREEN BREAST BILATERAL    Other Visit Diagnoses    Need for immunization against influenza       Relevant Orders   Flu Vaccine QUAD 36+ mos IM (Completed)       ChEinar PheasantMD

## 2019-11-14 ENCOUNTER — Other Ambulatory Visit: Payer: Self-pay | Admitting: Critical Care Medicine

## 2019-11-14 ENCOUNTER — Encounter: Payer: Self-pay | Admitting: Internal Medicine

## 2019-11-14 DIAGNOSIS — F439 Reaction to severe stress, unspecified: Secondary | ICD-10-CM | POA: Insufficient documentation

## 2019-11-14 DIAGNOSIS — Z1239 Encounter for other screening for malignant neoplasm of breast: Secondary | ICD-10-CM | POA: Insufficient documentation

## 2019-11-14 NOTE — Assessment & Plan Note (Signed)
Order placed for mammogram. She will schedule.  

## 2019-11-14 NOTE — Assessment & Plan Note (Signed)
Low carb diet and exercise.  Follow met b and a1c.  

## 2019-11-14 NOTE — Assessment & Plan Note (Signed)
Off tapazole.  On synthroid now.  Follow tsh.

## 2019-11-14 NOTE — Assessment & Plan Note (Signed)
Has been an issue since covid.  MRI no acute abnormality.  Has an appt scheduled with neurology for formal evaluation.

## 2019-11-14 NOTE — Assessment & Plan Note (Signed)
Increased stress as outlined.  Seeing psychiatry. On cymalta - taking 60mg  in am and 30mg  in pm. Doing better on this regimen.  Follow.

## 2019-11-14 NOTE — Assessment & Plan Note (Signed)
Persistent intermittent tremors noted post covid.  MRI revealed no acute abnormality.  Has appt scheduled with neurology for further evaluation.

## 2019-11-14 NOTE — Telephone Encounter (Signed)
Patient called stated that the [employer] form is on mychart and one page needs to be faxed to HR

## 2019-11-14 NOTE — Assessment & Plan Note (Signed)
Off cholesterol medication.  Low cholesterol diet and exercise.  Follow lipid panel.  

## 2019-11-14 NOTE — Assessment & Plan Note (Signed)
Since covid, has had persistent sob and DOE.  Breathing is better now.  No evidence of ILD.  Uses albuterol prn. She has also had persistent problems with intermittent tremors, scalp tingling and trouble focusing, concentrating and short term memory issues.  Had formal neuropsych evaluation as outlined.  MRI revealed no acute abnormality.  Has appt scheduled with neurology for formal neurological evaluation.

## 2019-11-15 NOTE — Telephone Encounter (Signed)
Dr. Carlis Abbott did you return these forms to me? - Thanks -pr

## 2019-11-16 NOTE — Telephone Encounter (Signed)
Yes, last week I completed them the night of her visit and gave them back to [facility] the following day.  LPC

## 2019-11-16 NOTE — Telephone Encounter (Signed)
Yes, I did receive it and sent to scan. -pr

## 2019-11-17 NOTE — Telephone Encounter (Signed)
Form placed in forms folder.

## 2019-11-18 NOTE — Telephone Encounter (Signed)
Form signed and placed in box.   

## 2019-11-18 NOTE — Telephone Encounter (Signed)
Form faxed

## 2019-11-18 NOTE — Telephone Encounter (Signed)
Letter sent to pt.  Please confirm receipt.  If did not receive, can print and send to pt.  Thanks

## 2019-11-21 DIAGNOSIS — R69 Illness, unspecified: Secondary | ICD-10-CM | POA: Diagnosis not present

## 2019-11-24 DIAGNOSIS — D225 Melanocytic nevi of trunk: Secondary | ICD-10-CM | POA: Diagnosis not present

## 2019-11-24 DIAGNOSIS — D0471 Carcinoma in situ of skin of right lower limb, including hip: Secondary | ICD-10-CM | POA: Diagnosis not present

## 2019-11-24 DIAGNOSIS — L812 Freckles: Secondary | ICD-10-CM | POA: Diagnosis not present

## 2019-11-24 DIAGNOSIS — D485 Neoplasm of uncertain behavior of skin: Secondary | ICD-10-CM | POA: Diagnosis not present

## 2019-11-24 DIAGNOSIS — L821 Other seborrheic keratosis: Secondary | ICD-10-CM | POA: Diagnosis not present

## 2019-11-24 DIAGNOSIS — L570 Actinic keratosis: Secondary | ICD-10-CM | POA: Diagnosis not present

## 2019-11-24 DIAGNOSIS — L111 Transient acantholytic dermatosis [Grover]: Secondary | ICD-10-CM | POA: Diagnosis not present

## 2019-11-24 DIAGNOSIS — Z1283 Encounter for screening for malignant neoplasm of skin: Secondary | ICD-10-CM | POA: Diagnosis not present

## 2019-11-24 DIAGNOSIS — Z85828 Personal history of other malignant neoplasm of skin: Secondary | ICD-10-CM | POA: Diagnosis not present

## 2019-11-24 DIAGNOSIS — C44712 Basal cell carcinoma of skin of right lower limb, including hip: Secondary | ICD-10-CM | POA: Diagnosis not present

## 2019-11-24 DIAGNOSIS — D1801 Hemangioma of skin and subcutaneous tissue: Secondary | ICD-10-CM | POA: Diagnosis not present

## 2019-12-13 DIAGNOSIS — Q828 Other specified congenital malformations of skin: Secondary | ICD-10-CM | POA: Diagnosis not present

## 2019-12-13 DIAGNOSIS — T50905A Adverse effect of unspecified drugs, medicaments and biological substances, initial encounter: Secondary | ICD-10-CM | POA: Diagnosis not present

## 2019-12-13 DIAGNOSIS — L853 Xerosis cutis: Secondary | ICD-10-CM | POA: Diagnosis not present

## 2019-12-13 DIAGNOSIS — D0471 Carcinoma in situ of skin of right lower limb, including hip: Secondary | ICD-10-CM | POA: Diagnosis not present

## 2019-12-13 DIAGNOSIS — R69 Illness, unspecified: Secondary | ICD-10-CM | POA: Diagnosis not present

## 2019-12-13 DIAGNOSIS — Z1283 Encounter for screening for malignant neoplasm of skin: Secondary | ICD-10-CM | POA: Diagnosis not present

## 2019-12-13 DIAGNOSIS — Z85828 Personal history of other malignant neoplasm of skin: Secondary | ICD-10-CM | POA: Diagnosis not present

## 2019-12-13 DIAGNOSIS — C44712 Basal cell carcinoma of skin of right lower limb, including hip: Secondary | ICD-10-CM | POA: Diagnosis not present

## 2019-12-13 DIAGNOSIS — F4323 Adjustment disorder with mixed anxiety and depressed mood: Secondary | ICD-10-CM | POA: Diagnosis not present

## 2019-12-20 DIAGNOSIS — R69 Illness, unspecified: Secondary | ICD-10-CM | POA: Diagnosis not present

## 2019-12-20 DIAGNOSIS — F411 Generalized anxiety disorder: Secondary | ICD-10-CM | POA: Diagnosis not present

## 2019-12-28 ENCOUNTER — Encounter: Payer: Self-pay | Admitting: Internal Medicine

## 2020-01-05 DIAGNOSIS — R69 Illness, unspecified: Secondary | ICD-10-CM | POA: Diagnosis not present

## 2020-01-05 DIAGNOSIS — F331 Major depressive disorder, recurrent, moderate: Secondary | ICD-10-CM | POA: Diagnosis not present

## 2020-01-09 DIAGNOSIS — R69 Illness, unspecified: Secondary | ICD-10-CM | POA: Diagnosis not present

## 2020-01-09 DIAGNOSIS — F4323 Adjustment disorder with mixed anxiety and depressed mood: Secondary | ICD-10-CM | POA: Diagnosis not present

## 2020-01-17 ENCOUNTER — Encounter: Payer: Self-pay | Admitting: Neurology

## 2020-01-17 ENCOUNTER — Other Ambulatory Visit (INDEPENDENT_AMBULATORY_CARE_PROVIDER_SITE_OTHER): Payer: 59

## 2020-01-17 ENCOUNTER — Ambulatory Visit (INDEPENDENT_AMBULATORY_CARE_PROVIDER_SITE_OTHER): Payer: 59 | Admitting: Neurology

## 2020-01-17 ENCOUNTER — Other Ambulatory Visit: Payer: Self-pay

## 2020-01-17 VITALS — BP 141/80 | HR 62 | Resp 18 | Ht 68.0 in | Wt 167.0 lb

## 2020-01-17 DIAGNOSIS — R202 Paresthesia of skin: Secondary | ICD-10-CM

## 2020-01-17 DIAGNOSIS — R251 Tremor, unspecified: Secondary | ICD-10-CM | POA: Diagnosis not present

## 2020-01-17 DIAGNOSIS — R413 Other amnesia: Secondary | ICD-10-CM

## 2020-01-17 LAB — VITAMIN B12: Vitamin B-12: 262 pg/mL (ref 211–911)

## 2020-01-17 NOTE — Progress Notes (Addendum)
NEUROLOGY CONSULTATION NOTE  Amber Richardson MRN: [id number] DOB: 02/20/1955  Referring provider: Einar Pheasant, MD Primary care provider: Einar Pheasant, MD  Reason for consult:  Tremors, paresthesias   Subjective:  Amber Richardson is a 64 year old right-handed female with HLD, hypothyroidism, depression, GAD and history of basal cell carcinoma who presents for tremors and paresthesias.  History supplemented by referring provider's notes.  She had COVID-19 infection in January 2021.  She was quite ill.  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  She developed persistent dyspnea afterwards requiring Albuterol and Advair treatments.  She started experiencing memory deficits that have progressively gotten worse.  She has trouble performing everyday tasks, word-finding difficulty, concerned that she may forget to leave the the burner on.  She also has difficulty explaining herself.  She underwent neuropsychological evaluation on 09/01/2019 which demonstrated isolated weakness across verbal fluency and performance variability across executive functioning.  Unclear if related to COVID as there is no data extensive data but findings an be explained by ongoing psychiatric distress and sleep dysfunction.  She also reports intermittent tingling on her scalp from top of head and involving the side of her cheek, as well as tingling in the bilateral arms and legs, which started around April.  No associated pain.  However she more recently developed a rash on the bottom of both feet that started to burn or hurt.  She saw a dermatologist who did not know the exact etiology but has failed steroid and antifungal treatment.  She also reports some tremors.  It is affecting her ability to perform fine-motor tasks such as typing.  She describes episodic tremor and shaking of either the upper extremities or lower extremities.  The hands usually shake with posture such as holding objects.  Tremor  or jerking of the legs occur spontaneously while sitting.  During these spells, she is able to stand up and walk although she says she feels a little weak.  At first they lasted several minutes but now last up to 30 minutes.  She does have depression and anxiety and is followed by psychiatry.  She had a difficult year.  Prior to contracting COVID, she was caring for her ill father who was in the hospital with multiple strokes.  After she became ill, he subsequently passed away due to complications of COVID.  She is still unable to work.  She has applied for long-term disability but has been denied.   TSH and Hgb A1c from August were 2.50 and 5.7 respectively.  CBC, BMP and hepatic panel were normal except for mildly reduced GFR of 52.   MRI of brain with and without contrast on 10/30/2019 personally reviewed was normal.     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 Mild capsulitis.   Tremor 08/28/2019   Subtle to mild, upper extremities, left worse than right    PAST SURGICAL HISTORY: Past Surgical History:  Procedure Laterality Date   DILATION  AND CURETTAGE OF UTERUS     Miscarriage     x1   TONSILLECTOMY  1963    MEDICATIONS: Current Outpatient Medications on File Prior to Visit  Medication Sig Dispense Refill   ADVAIR DISKUS 250-50 MCG/DOSE AEPB TAKE 1 PUFF BY MOUTH TWICE A DAY 60 each 3   albuterol (VENTOLIN HFA) 108 (90 Base) MCG/ACT inhaler TAKE 2 PUFFS BY MOUTH  EVERY 6 HOURS AS NEEDED FOR WHEEZE OR SHORTNESS OF BREATH 18 each 1   ALPRAZolam (XANAX) 0.5 MG tablet Take by mouth.     Calcium Citrate-Vitamin D (CITRACAL + D PO) Take by mouth. Take one in the morning & 2 in the afternoon     DULoxetine HCl 40 MG CPEP 90 mg.   1   fluticasone (FLONASE) 50 MCG/ACT nasal spray SPRAY 2 SPRAYS INTO EACH NOSTRIL EVERY DAY 16 mL 1   ibuprofen (ADVIL,MOTRIN) 600 MG tablet Take 600 mg by mouth as needed for pain.     ipratropium (ATROVENT) 0.03 % nasal spray PLACE 2 SPRAYS INTO BOTH NOSTRILS EVERY 12 (TWELVE) HOURS. 30 mL 1   levothyroxine (SYNTHROID) 75 MCG tablet Take 1 tablet (75 mcg total) by mouth daily before breakfast. 90 tablet 1   Multiple Vitamin (MULTIVITAMIN) tablet Take 1 tablet by mouth daily.     nystatin (MYCOSTATIN) 100000 UNIT/ML suspension Take 5 mLs (500,000 Units total) by mouth 2 (two) times daily. 120 mL 0   Respiratory Therapy Supplies (FLUTTER) DEVI 1 each by Does not apply route 2 (two) times daily. 1 each 0   valACYclovir (VALTREX) 500 MG tablet Take one to two tablets by mouth daily     No current facility-administered medications on file prior to visit.    ALLERGIES: Allergies  Allergen Reactions   Levaquin [Levofloxacin In D5w] Other (See Comments)    Numbness and tingling   Sertraline Other (See Comments)    Trimmers    Sulfa Antibiotics Rash    FAMILY HISTORY: Family History  Problem Relation Age of Onset   Hypertension Mother    Hypercholesterolemia Mother    Heart disease Father    Hyperlipidemia Father    Hypercholesterolemia Father    Stroke Father    Dementia Father        Vascular dementia related to stroke history   SOCIAL HISTORY: Social History   Socioeconomic History   Marital status: Married    Spouse name: Not on file   Number of children: 0   Years of education: 16   Highest education level: Bachelor's degree (e.g., BA, AB, BS)  Occupational History   Not on file   Tobacco Use   Smoking status: Never Smoker   Smokeless tobacco: Never Used  Vaping Use   Vaping Use: Never used  Substance and Sexual Activity   Alcohol use: No    Alcohol/week: 0.0 standard drinks   Drug use: No   Sexual activity: Not on file  Other Topics Concern   Not on file  Social History Narrative   Not on file   Social Determinants of Health   Financial Resource Strain: Not on file  Food Insecurity: Not on file  Transportation Needs: Not on file  Physical Activity: Not on file  Stress: Not on file  Social Connections: Not on file  Intimate Partner Violence: Not on file    Objective:  Blood pressure (!) 141/80, pulse 62, resp. rate 18, height 5\' 8"  (1.727 m), weight 167 lb (75.8 kg), last menstrual period 01/13/2008,  SpO2 97 %. General: No acute distress.  Patient appears well-groomed.   Head:  Normocephalic/atraumatic Eyes:  fundi examined but not visualized Neck: supple, no paraspinal tenderness, full range of motion Back: No paraspinal tenderness Heart: regular rate and rhythm Lungs: Clear to auscultation bilaterally. Vascular: No carotid bruits. Neurological Exam: Mental status: alert and oriented to person, place, and time, recalled 3 of 5 words after 5 minutes, remote memory intact, fund of knowledge intact, attention and concentration intact, speech fluent and not dysarthric, language intact. Cranial nerves: CN I: not tested CN II: pupils equal, round and reactive to light, visual fields intact CN III, IV, VI:  full range of motion, no nystagmus, no ptosis CN V: facial sensation intact. CN VII: upper and lower face symmetric CN VIII: hearing intact CN IX, X: gag intact, uvula midline CN XI: sternocleidomastoid and trapezius muscles intact CN XII: tongue midline Bulk & Tone: normal, no fasciculations. Motor:  muscle strength 5/5 throughout Sensation:  Pinprick, temperature and vibratory sensation intact. Deep Tendon Reflexes:  2+ throughout,   toes downgoing.   Finger to nose testing:  Without dysmetria.   Heel to shin:  Without dysmetria.   Gait:  Normal station and stride.  Romberg negative.  Assessment/Plan:   Chronic symptoms post-COVID infection -  Memory deficits -  Tremor -  paresthesias  At this time, I am inclined to think that her symptoms are psychogenic.  Profile of deficits on neuropsychological testing correlated with what may be seen in anxiety.  I observed her intermittent tremor which was of variable speed and amplitude and appeared distractible, suggesting psychogenic etiology. However, I would like to further evaluate paresthesias.  1.  I will check B12 level as low B12 may contribute to memory deficits and paresthesias 2.  We will check NCV-EMG of right upper and lower extremities to evaluate for underlying neuropathy. 3.  Follow up after testing.  Thank you for allowing me to take part in the care of this patient.  Metta Clines, DO  CC:  Einar Pheasant, MD

## 2020-01-17 NOTE — Patient Instructions (Addendum)
1.  We will check a B12 level 2.  Follow up after testing.  Will look into other possible testing  Your provider has requested that you have labwork completed today. Please go to [facility] ([id number]) on the second floor of this building before leaving the office today. You do not need to check in. If you are not called within 15 minutes please check with the front desk.

## 2020-01-18 ENCOUNTER — Other Ambulatory Visit: Payer: Self-pay

## 2020-01-18 MED ORDER — VITAMIN B-12 1000 MCG PO TABS: 1000.0000 ug | ORAL_TABLET | Freq: Every day | ORAL | Status: AC

## 2020-01-18 NOTE — Addendum Note (Signed)
Addended by: Venetia Night on: 01/18/2020 08:03 AM   Modules accepted: Orders

## 2020-02-01 ENCOUNTER — Other Ambulatory Visit: Payer: Self-pay | Admitting: Family

## 2020-02-01 DIAGNOSIS — Z20822 Contact with and (suspected) exposure to covid-19: Secondary | ICD-10-CM

## 2020-02-07 ENCOUNTER — Telehealth: Payer: Self-pay | Admitting: Internal Medicine

## 2020-02-07 DIAGNOSIS — Z8616 Personal history of COVID-19: Secondary | ICD-10-CM

## 2020-02-07 DIAGNOSIS — E0590 Thyrotoxicosis, unspecified without thyrotoxic crisis or storm: Secondary | ICD-10-CM

## 2020-02-07 NOTE — Telephone Encounter (Signed)
Can we add for patient to have done at her lab appt on 02/10/20?

## 2020-02-07 NOTE — Addendum Note (Signed)
Addended by: Alisa Graff on: 02/07/2020 05:30 PM   Modules accepted: Orders

## 2020-02-07 NOTE — Telephone Encounter (Signed)
Added covid antibody test.

## 2020-02-07 NOTE — Telephone Encounter (Signed)
Pt mom called for pt to see if a covid antibody could be added on to her lab work

## 2020-02-08 ENCOUNTER — Ambulatory Visit (INDEPENDENT_AMBULATORY_CARE_PROVIDER_SITE_OTHER): Payer: 59 | Admitting: Pulmonary Disease

## 2020-02-08 ENCOUNTER — Other Ambulatory Visit: Payer: Self-pay

## 2020-02-08 ENCOUNTER — Encounter: Payer: Self-pay | Admitting: Pulmonary Disease

## 2020-02-08 VITALS — BP 116/72 | HR 68 | Temp 97.5°F | Ht 68.0 in | Wt 167.6 lb

## 2020-02-08 DIAGNOSIS — R0602 Shortness of breath: Secondary | ICD-10-CM

## 2020-02-08 DIAGNOSIS — Z8616 Personal history of COVID-19: Secondary | ICD-10-CM

## 2020-02-08 NOTE — Telephone Encounter (Signed)
Her tsh was normal in 08/2019.  I have added it to labs to confirm remaining normal.  Thanks

## 2020-02-08 NOTE — Addendum Note (Signed)
Addended by: Leeanne Rio on: 02/08/2020 09:09 AM   Modules accepted: Orders

## 2020-02-08 NOTE — Patient Instructions (Addendum)
We will provide you with a sample of Spiriva to determine if you notice any benefit in your shortness of breath. - Take 1 puff daily  Continue albuterol as needed for shortness of breath  Ok to stop advair  Continue to walk and stay active as able

## 2020-02-08 NOTE — Addendum Note (Signed)
Addended by: Alisa Graff on: 02/08/2020 09:11 AM   Modules accepted: Orders

## 2020-02-08 NOTE — Progress Notes (Signed)
Synopsis: Referred in March 2021 for post Covid by Einar Pheasant, MD.  Subjective:   PATIENT ID: Amber Arenas GENDER: female DOB: 1955/08/08, MRN: [id number]   HPI  Chief Complaint  Patient presents with  . Establish Care    Former [facility] patient, hx of COVID. Last seen on 11/07/19. Patient states she has been stable since last visit. Uses albuterol inhaler before exertion. Still having problems with DOE. Denies any increased coughing.    Amber Richardson is a 65 year old woman, never smoker who returns to pulmonary clinic with on going exertional dyspnea after Covid 19 infection in January 2021.   Overall, her respiratory symptoms have improved greatly since her initial infection but she continues to experience exertional dyspna when taking a shower or climbing the stairs. She has been going for walks outside and will use her albuterol inhaler prior to walking which she notes improvement in her breathing for the walks. She has been using advair most days and does not notice much of a difference on the days that she does not use advair.   She continues to have episodes of tremors and uncontrolled shaking of her extremities. She is being followed by neurology and is scheduled for neuropsychiatric evaluation.     She does report some snoring but otherwise reports feeling rested when she does sleep. She has trouble falling asleep and staying asleep due to increased worrying and anxiety.   She denies wheezing or cough.   Past Medical History:  Diagnosis Date  . Allergies   . Basal cell carcinoma   . Fatigue 08/30/2014  . Generalized anxiety disorder 03/21/2011  . GERD (gastroesophageal reflux disease)   . History of COVID-19 02/22/2019  . HLD (hyperlipidemia)   . 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 Hypercholesterolemia 02/08/2015  . Hyperglycemia 08/28/2019  . Hyperthyroidism   . Major depressive disorder   . Osteoporosis 05/13/2015  . Pyelonephritis 1985  . Scalp, arm, and leg  tingling 08/28/2019  . Tendinosis 09/12/2013   Rotator cuff tendinosis. Mild capsulitis.  . Tremor 08/28/2019   Subtle to mild, upper extremities, left worse than right     Family History  Problem Relation Age of Onset  . Hypertension Mother   . Hypercholesterolemia Mother   . Heart disease Father   . Hyperlipidemia Father   . Hypercholesterolemia Father   . Stroke Father   . Dementia Father        Vascular dementia related to stroke history     Social History   Socioeconomic History  . Marital status: Married    Spouse name: Not on file  . Number of children: 0  . Years of education: 16  . Highest education level: Bachelor's degree (e.Richardson., BA, AB, BS)  Occupational History  . Not on file  Tobacco Use  . Smoking status: Never Smoker  . Smokeless tobacco: Never Used  Vaping Use  . Vaping Use: Never used  Substance and Sexual Activity  . Alcohol use: No    Alcohol/week: 0.0 standard drinks  . Drug use: No  . Sexual activity: Not on file  Other Topics Concern  . Not on file  Social History Narrative   Right handed   Drinks one cup coffee am and one pepsi   Social Determinants of Health   Financial Resource Strain: Not on file  Food Insecurity: Not on file  Transportation Needs: Not on file  Physical Activity: Not on file  Stress: Not on file  Social Connections: Not on file  Intimate Partner Violence: Not on file     Allergies  Allergen Reactions  . Levaquin [Levofloxacin In D5w] Other (See Comments)    Numbness and tingling  . Crestor [Rosuvastatin]   . Sertraline Other (See Comments)    Trimmers   . Sulfa Antibiotics Rash     Outpatient Medications Prior to Visit  Medication Sig Dispense Refill  . ADVAIR DISKUS 250-50 MCG/DOSE AEPB TAKE 1 PUFF BY MOUTH TWICE A DAY 60 each 3  . albuterol (VENTOLIN HFA) 108 (90 Base) MCG/ACT inhaler TAKE 2 PUFFS BY MOUTH EVERY 6 HOURS AS NEEDED FOR WHEEZE OR SHORTNESS OF BREATH 18 each 1  . ALPRAZolam (XANAX) 0.5 MG  tablet Take by mouth.    . Calcium Citrate-Vitamin D (CITRACAL + D PO) Take by mouth. Take one in the morning & 2 in the afternoon    . DULoxetine (CYMBALTA) 20 MG capsule Take 20 mg by mouth daily.    . DULoxetine (CYMBALTA) 60 MG capsule Take 60 mg by mouth daily.    . fluticasone (FLONASE) 50 MCG/ACT nasal spray SPRAY 2 SPRAYS INTO EACH NOSTRIL EVERY DAY 16 mL 1  . ibuprofen (ADVIL,MOTRIN) 600 MG tablet Take 600 mg by mouth as needed for pain.    Marland Kitchen ipratropium (ATROVENT) 0.03 % nasal spray PLACE 2 SPRAYS INTO BOTH NOSTRILS EVERY 12 (TWELVE) HOURS. 30 mL 1  . levothyroxine (SYNTHROID) 75 MCG tablet Take 1 tablet (75 mcg total) by mouth daily before breakfast. 90 tablet 1  . Multiple Vitamin (MULTIVITAMIN) tablet Take 1 tablet by mouth daily.    Marland Kitchen nystatin (MYCOSTATIN) 100000 UNIT/ML suspension Take 5 mLs (500,000 Units total) by mouth 2 (two) times daily. 120 mL 0  . Respiratory Therapy Supplies (FLUTTER) DEVI 1 each by Does not apply route 2 (two) times daily. 1 each 0  . traZODone (DESYREL) 50 MG tablet 50 mg. Takes 1/2 tab po prn    . valACYclovir (VALTREX) 500 MG tablet Take one to two tablets by mouth daily    . vitamin B-12 (CYANOCOBALAMIN) 1000 MCG tablet Take 1 tablet (1,000 mcg total) by mouth daily.     No facility-administered medications prior to visit.    Review of Systems  Constitutional: Negative for chills, fever, malaise/fatigue and weight loss.  HENT: Negative for congestion, sinus pain and sore throat.   Eyes: Negative.   Respiratory: Positive for shortness of breath.   Cardiovascular: Negative for chest pain, palpitations, orthopnea, claudication, leg swelling and PND.  Gastrointestinal: Negative for abdominal pain, heartburn, nausea and vomiting.  Genitourinary: Negative.   Musculoskeletal: Negative.   Skin: Negative for rash.  Neurological: Positive for tremors.  Endo/Heme/Allergies: Negative.   Psychiatric/Behavioral: The patient is nervous/anxious.     Objective:   Vitals:   02/08/20 1129  BP: 116/72  Pulse: 68  Temp: (!) 97.5 F (36.4 C)  TempSrc: Temporal  SpO2: 98%  Weight: 167 lb 9.6 oz (76 kg)  Height: 5\' 8"  (1.727 m)     Physical Exam Constitutional:      General: She is not in acute distress.    Appearance: Normal appearance. She is normal weight. She is not ill-appearing.  HENT:     Head: Normocephalic and atraumatic.  Eyes:     General: No scleral icterus.    Conjunctiva/sclera: Conjunctivae normal.     Pupils: Pupils are equal, round, and reactive to light.  Cardiovascular:     Rate and Rhythm: Normal rate and regular rhythm.     Pulses:  Normal pulses.     Heart sounds: Normal heart sounds. No murmur heard.   Pulmonary:     Effort: Pulmonary effort is normal.     Breath sounds: Normal breath sounds. No wheezing, rhonchi or rales.  Abdominal:     General: Bowel sounds are normal.     Palpations: Abdomen is soft.  Musculoskeletal:     Right lower leg: No edema.     Left lower leg: No edema.  Skin:    General: Skin is warm and dry.  Neurological:     General: No focal deficit present.     Mental Status: She is alert.  Psychiatric:        Mood and Affect: Mood normal.        Behavior: Behavior normal.        Thought Content: Thought content normal.        Judgment: Judgment normal.    CBC    Component Value Date/Time   WBC 5.2 09/05/2019 1001   RBC 3.99 09/05/2019 1001   HGB 13.0 09/05/2019 1001   HCT 38.9 09/05/2019 1001   PLT 218.0 09/05/2019 1001   MCV 97.4 09/05/2019 1001   MCHC 33.5 09/05/2019 1001   RDW 13.4 09/05/2019 1001   LYMPHSABS 1.9 09/05/2019 1001   MONOABS 0.5 09/05/2019 1001   EOSABS 0.1 09/05/2019 1001   BASOSABS 0.0 09/05/2019 1001   BMP Latest Ref Rng & Units 09/22/2019 09/05/2019 09/22/2018  Glucose 70 - 99 mg/dL 89 89 94  BUN 6 - 23 mg/dL 12 18 13   Creatinine 0.40 - 1.20 mg/dL 0.89 1.07 0.88  Sodium 135 - 145 mEq/L 136 139 137  Potassium 3.5 - 5.1 mEq/L 4.5 4.2 4.3   Chloride 96 - 112 mEq/L 101 102 102  CO2 19 - 32 mEq/L 30 25 29   Calcium 8.4 - 10.5 mg/dL 9.4 9.2 9.4     Chest imaging: HRCT Chest 05/03/19 1. Minimal scarring of the lungs. No evidence of fibrotic interstitial lung disease.  2.  Aortic Atherosclerosis (ICD10-I70.0).  3. 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  PFT: PFT Results Latest Ref Rng & Units 05/23/2019  FVC-Pre L 3.17  FVC-Predicted Pre % 83  FVC-Post L 3.26  FVC-Predicted Post % 86  Pre FEV1/FVC % % 81  Post FEV1/FCV % % 83  FEV1-Pre L 2.56  FEV1-Predicted Pre % 87  FEV1-Post L 2.70  DLCO uncorrected ml/min/mmHg 23.34  DLCO UNC% % 101  DLCO corrected ml/min/mmHg 23.34  DLCO COR %Predicted % 101  DLVA Predicted % 111  TLC L 5.35  TLC % Predicted % 94  RV % Predicted % 85   Spriometry is normal. Lung volumes are normal and diffusion capacity is normal.     Assessment & Plan:   History of 2019 novel coronavirus disease (COVID-19)  Shortness of breath  Discussion: Amber Richardson is a 65 year old woman, never smoker who returns to pulmonary clinic with on going exertional dyspnea after Covid 19 infection in January 2021.  She continues to experience exertional dyspnea with showering and climbing stairs. Her pulmonary function testing from April 2021 is normal. Chest imaging reviewed and she has minimal scarring of the lungs noted on HRCT Chest April 2021. She does not have ambulatory desaturations in her oxygen levels. She has not have an echocardiogram on file which would be the next study to check for exertional dyspna, but she does not have signs or symptoms of heart  failure. We can consider this in the future. She also does not have clinical history of obstructive sleep apnea.   In discussion about her advair inhaler, she does not seem to receive much benefit from this inhaler so I recommended that she stop the inhaler and monitor her respiratory symptoms and dyspnea. She  can continue as needed albuterol she does notice benefit from this medication.   Encouraged her to stay as active as possible and continue to walk outside.   Follow up in 6 months  Freda Jackson, MD [place] Office: [phone]   See Amion for Pager Details      Current Outpatient Medications:  .  ADVAIR DISKUS 250-50 MCG/DOSE AEPB, TAKE 1 PUFF BY MOUTH TWICE A DAY, Disp: 60 each, Rfl: 3 .  albuterol (VENTOLIN HFA) 108 (90 Base) MCG/ACT inhaler, TAKE 2 PUFFS BY MOUTH EVERY 6 HOURS AS NEEDED FOR WHEEZE OR SHORTNESS OF BREATH, Disp: 18 each, Rfl: 1 .  ALPRAZolam (XANAX) 0.5 MG tablet, Take by mouth., Disp: , Rfl:  .  Calcium Citrate-Vitamin D (CITRACAL + D PO), Take by mouth. Take one in the morning & 2 in the afternoon, Disp: , Rfl:  .  DULoxetine (CYMBALTA) 20 MG capsule, Take 20 mg by mouth daily., Disp: , Rfl:  .  DULoxetine (CYMBALTA) 60 MG capsule, Take 60 mg by mouth daily., Disp: , Rfl:  .  fluticasone (FLONASE) 50 MCG/ACT nasal spray, SPRAY 2 SPRAYS INTO EACH NOSTRIL EVERY DAY, Disp: 16 mL, Rfl: 1 .  ibuprofen (ADVIL,MOTRIN) 600 MG tablet, Take 600 mg by mouth as needed for pain., Disp: , Rfl:  .  ipratropium (ATROVENT) 0.03 % nasal spray, PLACE 2 SPRAYS INTO BOTH NOSTRILS EVERY 12 (TWELVE) HOURS., Disp: 30 mL, Rfl: 1 .  levothyroxine (SYNTHROID) 75 MCG tablet, Take 1 tablet (75 mcg total) by mouth daily before breakfast., Disp: 90 tablet, Rfl: 1 .  Multiple Vitamin (MULTIVITAMIN) tablet, Take 1 tablet by mouth daily., Disp: , Rfl:  .  nystatin (MYCOSTATIN) 100000 UNIT/ML suspension, Take 5 mLs (500,000 Units total) by mouth 2 (two) times daily., Disp: 120 mL, Rfl: 0 .  Respiratory Therapy Supplies (FLUTTER) DEVI, 1 each by Does not apply route 2 (two) times daily., Disp: 1 each, Rfl: 0 .  traZODone (DESYREL) 50 MG tablet, 50 mg. Takes 1/2 tab po prn, Disp: , Rfl:  .  valACYclovir (VALTREX) 500 MG tablet, Take one to two tablets by mouth daily, Disp:  , Rfl:  .  vitamin B-12 (CYANOCOBALAMIN) 1000 MCG tablet, Take 1 tablet (1,000 mcg total) by mouth daily., Disp: , Rfl:

## 2020-02-08 NOTE — Telephone Encounter (Signed)
Spoke with patient about antibody test being added to labs. Patient asked if TSH can be checked too as that was not one of the ordered labs?

## 2020-02-10 ENCOUNTER — Other Ambulatory Visit (INDEPENDENT_AMBULATORY_CARE_PROVIDER_SITE_OTHER): Payer: 59

## 2020-02-10 ENCOUNTER — Other Ambulatory Visit: Payer: Self-pay

## 2020-02-10 DIAGNOSIS — Z8616 Personal history of COVID-19: Secondary | ICD-10-CM

## 2020-02-10 DIAGNOSIS — E7800 Pure hypercholesterolemia, unspecified: Secondary | ICD-10-CM | POA: Diagnosis not present

## 2020-02-10 DIAGNOSIS — R739 Hyperglycemia, unspecified: Secondary | ICD-10-CM | POA: Diagnosis not present

## 2020-02-10 DIAGNOSIS — E0590 Thyrotoxicosis, unspecified without thyrotoxic crisis or storm: Secondary | ICD-10-CM

## 2020-02-10 LAB — HEPATIC FUNCTION PANEL
ALT: 18 U/L (ref 0–35)
AST: 22 U/L (ref 0–37)
Albumin: 4.7 g/dL (ref 3.5–5.2)
Alkaline Phosphatase: 130 U/L — ABNORMAL HIGH (ref 39–117)
Bilirubin, Direct: 0.1 mg/dL (ref 0.0–0.3)
Total Bilirubin: 0.8 mg/dL (ref 0.2–1.2)
Total Protein: 6.7 g/dL (ref 6.0–8.3)

## 2020-02-10 LAB — TSH: TSH: 10.06 u[IU]/mL — ABNORMAL HIGH (ref 0.35–4.50)

## 2020-02-10 LAB — LIPID PANEL
Cholesterol: 303 mg/dL — ABNORMAL HIGH (ref 0–200)
HDL: 80.3 mg/dL (ref >39.00)
LDL Cholesterol: 199 mg/dL — ABNORMAL HIGH (ref 0–99)
NonHDL: 222.66
Total CHOL/HDL Ratio: 4
Triglycerides: 120 mg/dL (ref 0.0–149.0)
VLDL: 24 mg/dL (ref 0.0–40.0)

## 2020-02-10 LAB — HEMOGLOBIN A1C: Hgb A1c MFr Bld: 5.7 % (ref 4.6–6.5)

## 2020-02-10 LAB — BASIC METABOLIC PANEL
BUN: 19 mg/dL (ref 6–23)
CO2: 28 mEq/L (ref 19–32)
Calcium: 9.5 mg/dL (ref 8.4–10.5)
Chloride: 102 mEq/L (ref 96–112)
Creatinine, Ser: 0.99 mg/dL (ref 0.40–1.20)
GFR: 61.3 mL/min (ref >60.00)
Glucose, Bld: 89 mg/dL (ref 70–99)
Potassium: 4.3 mEq/L (ref 3.5–5.1)
Sodium: 137 mEq/L (ref 135–145)

## 2020-02-13 ENCOUNTER — Encounter: Payer: Self-pay | Admitting: Internal Medicine

## 2020-02-13 NOTE — Telephone Encounter (Signed)
Please print form and pull old forms for completion.  Thanks.

## 2020-02-16 LAB — SARS-COV-2 SEMI-QUANTITATIVE TOTAL ANTIBODY, SPIKE: SARS COV2 AB, Total Spike Semi QN: >2500 U/mL — ABNORMAL HIGH (ref <0.8)

## 2020-02-16 NOTE — Telephone Encounter (Signed)
Forms have been completed.

## 2020-02-21 DIAGNOSIS — F411 Generalized anxiety disorder: Secondary | ICD-10-CM | POA: Diagnosis not present

## 2020-02-21 DIAGNOSIS — R69 Illness, unspecified: Secondary | ICD-10-CM | POA: Diagnosis not present

## 2020-03-06 DIAGNOSIS — F4323 Adjustment disorder with mixed anxiety and depressed mood: Secondary | ICD-10-CM | POA: Diagnosis not present

## 2020-03-06 DIAGNOSIS — Z733 Stress, not elsewhere classified: Secondary | ICD-10-CM | POA: Diagnosis not present

## 2020-03-06 DIAGNOSIS — R69 Illness, unspecified: Secondary | ICD-10-CM | POA: Diagnosis not present

## 2020-03-07 ENCOUNTER — Other Ambulatory Visit: Payer: Self-pay

## 2020-03-07 ENCOUNTER — Ambulatory Visit (INDEPENDENT_AMBULATORY_CARE_PROVIDER_SITE_OTHER): Payer: 59 | Admitting: Neurology

## 2020-03-07 DIAGNOSIS — R202 Paresthesia of skin: Secondary | ICD-10-CM | POA: Diagnosis not present

## 2020-03-07 NOTE — Procedures (Signed)
[facility]  [address]  [place], [place] [address] Tel: [phone] Fax:  [phone] Test Date:  03/07/2020  Patient: Amber Richardson DOB: 1955/09/21 Physician: Narda Amber, DO  Sex: Female Height: 5\' 8"  Ref Phys: Metta Clines, D.O.  ID#: [id number]   Technician:    Patient Complaints: This is a 65 year old female referred for evaluation of generalized paresthesias in the arms and legs.  NCV & EMG Findings: Extensive electrodiagnostic testing of the right upper and lower extremity shows: 1. All sensory responses including the right median, ulnar, mixed palmar, sural, and superficial peroneal nerves are within normal limits. 2. All motor responses including the right median, ulnar, peroneal, and tibial nerves are within normal limits. 3. Right tibial H reflex study is within normal limits. 4. There is no evidence of active or chronic motor axonal loss changes affecting any of the tested muscles.  Motor unit configuration and recruitment pattern is within normal limits.  Impression: This is a normal study of the right upper and lower extremities.  In particular, there is no evidence of a sensorimotor polyneuropathy, cervical/lumbosacral radiculopathy, carpal tunnel syndrome.   ___________________________ Narda Amber, DO    Nerve Conduction Studies Anti Sensory Summary Table   Stim Site NR Peak (ms) Norm Peak (ms) P-T Amp (V) Norm P-T Amp  Right Median Anti Sensory (2nd Digit)  32C  Wrist    2.9 <3.8 52.9 >10  Right Sup Peroneal Anti Sensory (Ant Lat Mall)  32C  12 cm    2.4 <4.6 9.7 >3  Right Sural Anti Sensory (Lat Mall)  32C  Calf    2.8 <4.6 23.7 >3  Right Ulnar Anti Sensory (5th Digit)  32C  Wrist    2.7 <3.2 41.2 >5   Motor Summary Table   Stim Site NR Onset (ms) Norm Onset (ms) O-P Amp (mV) Norm O-P Amp Site1 Site2 Delta-0 (ms) Dist (cm) Vel (m/s) Norm Vel (m/s)  Right Median Motor (Abd Poll Brev)  32C  Wrist    2.7 <4.0 9.0 >5  Elbow Wrist 4.7 29.0 62 >50  Elbow    7.4  8.8         Right Peroneal Motor (Ext Dig Brev)  32C  Ankle    3.2 <6.0 7.3 >2.5 B Fib Ankle 8.0 39.0 49 >40  B Fib    11.2  6.5  Poplt B Fib 1.8 9.0 50 >40  Poplt    13.0  6.3         Right Tibial Motor (Abd Hall Brev)  32C  Ankle    4.5 <6.0 9.6 >4 Knee Ankle 9.3 43.0 46 >40  Knee    13.8  7.3         Right Ulnar Motor (Abd Dig Minimi)  32C  Wrist    2.3 <3.1 10.5 >7 B Elbow Wrist 3.4 21.0 62 >50  B Elbow    5.7  9.5  A Elbow B Elbow 1.8 10.0 56 >50  A Elbow    7.5  9.3          Comparison Summary Table   Stim Site NR Peak (ms) Norm Peak (ms) P-T Amp (V) Site1 Site2 Delta-P (ms) Norm Delta (ms)  Right Median/Ulnar Palm Comparison (Wrist - 8cm)  32C  Median Palm    1.5 <2.2 115.1 Median Palm Ulnar Palm 0.2   Ulnar Palm    1.7 <2.2 26.0       H Reflex Studies   NR  H-Lat (ms) Lat Norm (ms) L-R H-Lat (ms)  Right Tibial (Gastroc)  32C     30.61 <35    EMG   Side Muscle Ins Act Fibs Psw Fasc Number Recrt Dur Dur. Amp Amp. Poly Poly. Comment  Right AntTibialis Nml Nml Nml Nml Nml Nml Nml Nml Nml Nml Nml Nml N/A  Right Gastroc Nml Nml Nml Nml Nml Nml Nml Nml Nml Nml Nml Nml N/A  Right Flex Dig Long Nml Nml Nml Nml Nml Nml Nml Nml Nml Nml Nml Nml N/A  Right RectFemoris Nml Nml Nml Nml Nml Nml Nml Nml Nml Nml Nml Nml N/A  Right GluteusMed Nml Nml Nml Nml Nml Nml Nml Nml Nml Nml Nml Nml N/A  Right 1stDorInt Nml Nml Nml Nml Nml Nml Nml Nml Nml Nml Nml Nml N/A  Right PronatorTeres Nml Nml Nml Nml Nml Nml Nml Nml Nml Nml Nml Nml N/A  Right Biceps Nml Nml Nml Nml Nml Nml Nml Nml Nml Nml Nml Nml N/A  Right Triceps Nml Nml Nml Nml Nml Nml Nml Nml Nml Nml Nml Nml N/A  Right Deltoid Nml Nml Nml Nml Nml Nml Nml Nml Nml Nml Nml Nml N/A      Waveforms:

## 2020-03-08 ENCOUNTER — Telehealth: Payer: Self-pay

## 2020-03-08 NOTE — Telephone Encounter (Signed)
Pt was given a appt with Jaffe for 03-09-20

## 2020-03-08 NOTE — Telephone Encounter (Signed)
Pt states she is still having the same problems DR.Tomi Likens she wanted to see if she could be seen or if she could be add to  the wait list to come in by the end of march so she can talk to you about  disability forms. They are due in April.    Hinton Dyer can you see if she is on the wait list. Per pt she asked if someone to add her?

## 2020-03-08 NOTE — Progress Notes (Signed)
Pt advised of her EMG results.

## 2020-03-08 NOTE — Telephone Encounter (Signed)
-----   Message from Pieter Partridge, DO sent at 03/08/2020  7:38 AM EST ----- Nerve study is normal.  No evidence of neuropathy or other nerve damage.

## 2020-03-08 NOTE — Telephone Encounter (Signed)
Patient is on the wait list.

## 2020-03-09 ENCOUNTER — Other Ambulatory Visit: Payer: Self-pay

## 2020-03-09 ENCOUNTER — Ambulatory Visit (INDEPENDENT_AMBULATORY_CARE_PROVIDER_SITE_OTHER): Payer: 59 | Admitting: Neurology

## 2020-03-09 ENCOUNTER — Encounter: Payer: Self-pay | Admitting: Neurology

## 2020-03-09 VITALS — BP 135/85 | HR 64 | Ht 68.0 in | Wt 170.0 lb

## 2020-03-09 DIAGNOSIS — R413 Other amnesia: Secondary | ICD-10-CM | POA: Diagnosis not present

## 2020-03-09 DIAGNOSIS — R251 Tremor, unspecified: Secondary | ICD-10-CM

## 2020-03-09 DIAGNOSIS — R202 Paresthesia of skin: Secondary | ICD-10-CM | POA: Diagnosis not present

## 2020-03-09 DIAGNOSIS — U099 Post covid-19 condition, unspecified: Secondary | ICD-10-CM | POA: Diagnosis not present

## 2020-03-09 NOTE — Patient Instructions (Signed)
Neurologic workup is unremarkable.  I don't have any definitive neurologic reason for your symptoms.  I will document your symptoms in my office.

## 2020-03-09 NOTE — Progress Notes (Signed)
NEUROLOGY FOLLOW UP OFFICE NOTE  Amber Richardson [id number]   Subjective:  Amber Richardson is a 65 year old right-handed female with HLD, hypothyroidism, depression, GAD and history of basal cell carcinoma who follows up for tremors, paresthesias and memory deficits.  UPDATE: Repeat B12 from 01/17/2020 was 262.  Recommended starting OTC B12 1046mcg daily.  She has been taking it daily since Christmas.  NCV-EMG on 03/07/2020 was normal with no evidence of neuropathy or muscle disease.   She reports some days of overwhelming exhaustion.  She continues to have difficulty with memory such as word-finding difficulty.  She has to use a pillbox now because she can't keep track of which medications she is taking.  Repeat TSH from 02/10/2019 was 10.06 because she was forgetting to take it.  She also has trouble remembering names of some medications.  Still endorses tremor and muscle jerks.  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  She is appealing for disability.  HISTORY: She had COVID-19 infection in January 2021.  She was quite ill.  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  She developed persistent dyspnea afterwards requiring Albuterol and Advair treatments.  She started experiencing memory deficits that have progressively gotten worse.  She has trouble performing everyday tasks, word-finding difficulty, concerned that she may forget to leave the the burner on.  She also has difficulty explaining herself.  She underwent neuropsychological evaluation on 09/01/2019 which demonstrated isolated weakness across verbal fluency and performance variability across executive functioning.  Unclear if related to COVID as there is no data extensive data but findings an be explained by ongoing psychiatric distress and sleep dysfunction.  She also reports intermittent tingling on her scalp from top of head and involving the side of her cheek, as well as tingling in the  bilateral arms and legs, which started around April.  No associated pain.  However she more recently developed a rash on the bottom of both feet that started to burn or hurt.  She saw a dermatologist who did not know the exact etiology but has failed steroid and antifungal treatment.  She also reports some tremors.  It is affecting her ability to perform fine-motor tasks such as typing.  She describes episodic tremor and shaking of either the upper extremities or lower extremities.  The hands usually shake with posture such as holding objects.  Tremor or jerking of the legs occur spontaneously while sitting.  During these spells, she is able to stand up and walk although she says she feels a little weak.  At first they lasted several minutes but now last up to 30 minutes.  She does have depression and anxiety and is followed by psychiatry.  She had a difficult year.  Prior to contracting COVID, she was caring for her ill father who was in the hospital with multiple strokes.  After she became ill, he subsequently passed away due to complications of COVID.  She is still unable to work.  She has applied for long-term disability but has been denied.  TSH and Hgb A1c from August 2021 were 2.50 and 5.7 respectively.  CBC, BMP and hepatic panel were normal except for mildly reduced GFR of 52.  MRI of brain with and without contrast on 10/30/2019 was normal.    PAST MEDICAL HISTORY: Past Medical History:  Diagnosis Date  . Allergies   . Basal cell carcinoma   . Fatigue 08/30/2014  .  Generalized anxiety disorder 03/21/2011  . GERD (gastroesophageal reflux disease)   . History of COVID-19 02/22/2019  . HLD (hyperlipidemia)   . 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 Hypercholesterolemia 02/08/2015  . Hyperglycemia 08/28/2019  . Hyperthyroidism   . Major depressive disorder   . Osteoporosis 05/13/2015  . Pyelonephritis 1985  . Scalp, arm, and leg tingling 08/28/2019  . Tendinosis 09/12/2013   Rotator cuff tendinosis.  Mild capsulitis.  . Tremor 08/28/2019   Subtle to mild, upper extremities, left worse than right    MEDICATIONS: Current Outpatient Medications on File Prior to Visit  Medication Sig Dispense Refill  . ADVAIR DISKUS 250-50 MCG/DOSE AEPB TAKE 1 PUFF BY MOUTH TWICE A DAY 60 each 3  . albuterol (VENTOLIN HFA) 108 (90 Base) MCG/ACT inhaler TAKE 2 PUFFS BY MOUTH EVERY 6 HOURS AS NEEDED FOR WHEEZE OR SHORTNESS OF BREATH 18 each 1  . ALPRAZolam (XANAX) 0.5 MG tablet Take by mouth.    . Calcium Citrate-Vitamin D (CITRACAL + D PO) Take by mouth. Take one in the morning & 2 in the afternoon    . DULoxetine (CYMBALTA) 20 MG capsule Take 20 mg by mouth daily.    . DULoxetine (CYMBALTA) 60 MG capsule Take 60 mg by mouth daily.    . fluticasone (FLONASE) 50 MCG/ACT nasal spray SPRAY 2 SPRAYS INTO EACH NOSTRIL EVERY DAY 16 mL 1  . ibuprofen (ADVIL,MOTRIN) 600 MG tablet Take 600 mg by mouth as needed for pain.    Marland Kitchen ipratropium (ATROVENT) 0.03 % nasal spray PLACE 2 SPRAYS INTO BOTH NOSTRILS EVERY 12 (TWELVE) HOURS. 30 mL 1  . levothyroxine (SYNTHROID) 75 MCG tablet Take 1 tablet (75 mcg total) by mouth daily before breakfast. 90 tablet 1  . Multiple Vitamin (MULTIVITAMIN) tablet Take 1 tablet by mouth daily.    Marland Kitchen nystatin (MYCOSTATIN) 100000 UNIT/ML suspension Take 5 mLs (500,000 Units total) by mouth 2 (two) times daily. 120 mL 0  . Respiratory Therapy Supplies (FLUTTER) DEVI 1 each by Does not apply route 2 (two) times daily. 1 each 0  . traZODone (DESYREL) 50 MG tablet 50 mg. Takes 1/2 tab po prn    . valACYclovir (VALTREX) 500 MG tablet Take one to two tablets by mouth daily    . vitamin B-12 (CYANOCOBALAMIN) 1000 MCG tablet Take 1 tablet (1,000 mcg total) by mouth daily.     No current facility-administered medications on file prior to visit.    ALLERGIES: Allergies  Allergen Reactions  . Levaquin [Levofloxacin In D5w] Other (See Comments)    Numbness and tingling  . Crestor [Rosuvastatin]   .  Sertraline Other (See Comments)    Trimmers   . Sulfa Antibiotics Rash    FAMILY HISTORY: Family History  Problem Relation Age of Onset  . Hypertension Mother   . Hypercholesterolemia Mother   . Heart disease Father   . Hyperlipidemia Father   . Hypercholesterolemia Father   . Stroke Father   . Dementia Father        Vascular dementia related to stroke history    SOCIAL HISTORY: Social History   Socioeconomic History  . Marital status: Married    Spouse name: Not on file  . Number of children: 0  . Years of education: 16  . Highest education level: Bachelor's degree (e.g., BA, AB, BS)  Occupational History  . Not on file  Tobacco Use  . Smoking status: Never Smoker  . Smokeless tobacco: Never Used  Vaping Use  . Vaping Use:  Never used  Substance and Sexual Activity  . Alcohol use: No    Alcohol/week: 0.0 standard drinks  . Drug use: No  . Sexual activity: Not on file  Other Topics Concern  . Not on file  Social History Narrative   Right handed   Drinks one cup coffee am and one pepsi   Social Determinants of Health   Financial Resource Strain: Not on file  Food Insecurity: Not on file  Transportation Needs: Not on file  Physical Activity: Not on file  Stress: Not on file  Social Connections: Not on file  Intimate Partner Violence: Not on file     Objective:  Blood pressure 135/85, pulse 64, height 5\' 8"  (1.727 m), weight 170 lb (77.1 kg), last menstrual period 01/13/2008, SpO2 98 %. General: Anxious.  Patient appears well-groomed.   Head:  Normocephalic/atraumatic Eyes:  Fundi examined but not visualized Neck: supple, no paraspinal tenderness, full range of motion Heart:  Regular rate and rhythm Lungs:  Clear to auscultation bilaterally Back: No paraspinal tenderness Neurological Exam: alert and oriented to person, place, and time. Attention span and concentration intact, recent and remote memory intact, fund of knowledge intact.  Speech fluent and not  dysarthric, language intact.  CN II-XII intact. Bulk and tone normal, muscle strength 5/5 throughout.  Intermittent postural tremor in hands when arms outstretched, and intention tremor.  Sensation to light touch, temperature and vibration intact.  Deep tendon reflexes 2+ throughout, toes downgoing.  Finger to nose and heel to shin testing intact.  Gait normal, Romberg negative.   Assessment/Plan:   Multiple symptomatology status post COVID infection:  Memory deficits, tremor and paresthesias.  I have no definitive neurologic explanation for her symptoms.  Nerve conduction study was negative for neuropathy.  MRI of brain was normal.  Neuropsychological testing demonstrated isolated weakness across verbal fluency and performance variability across executive functioning, unknown if this may could be sequela from [place] but can be explained by underlying anxiety.  From a neurologic perspective, I have no further recommendations in regards to testing or treatment.    Metta Clines, DO  CC:  Einar Pheasant, MD

## 2020-03-13 ENCOUNTER — Ambulatory Visit (INDEPENDENT_AMBULATORY_CARE_PROVIDER_SITE_OTHER): Payer: 59 | Admitting: Internal Medicine

## 2020-03-13 ENCOUNTER — Encounter: Payer: Self-pay | Admitting: Internal Medicine

## 2020-03-13 ENCOUNTER — Other Ambulatory Visit: Payer: Self-pay

## 2020-03-13 VITALS — BP 110/60 | HR 67 | Temp 97.8°F | Resp 16 | Ht 68.0 in | Wt 169.0 lb

## 2020-03-13 DIAGNOSIS — E0590 Thyrotoxicosis, unspecified without thyrotoxic crisis or storm: Secondary | ICD-10-CM

## 2020-03-13 DIAGNOSIS — F439 Reaction to severe stress, unspecified: Secondary | ICD-10-CM

## 2020-03-13 DIAGNOSIS — R251 Tremor, unspecified: Secondary | ICD-10-CM | POA: Diagnosis not present

## 2020-03-13 DIAGNOSIS — R5383 Other fatigue: Secondary | ICD-10-CM

## 2020-03-13 DIAGNOSIS — R69 Illness, unspecified: Secondary | ICD-10-CM | POA: Diagnosis not present

## 2020-03-13 DIAGNOSIS — E7800 Pure hypercholesterolemia, unspecified: Secondary | ICD-10-CM | POA: Diagnosis not present

## 2020-03-13 DIAGNOSIS — Z8616 Personal history of COVID-19: Secondary | ICD-10-CM

## 2020-03-13 DIAGNOSIS — R739 Hyperglycemia, unspecified: Secondary | ICD-10-CM

## 2020-03-13 DIAGNOSIS — F411 Generalized anxiety disorder: Secondary | ICD-10-CM

## 2020-03-13 DIAGNOSIS — R202 Paresthesia of skin: Secondary | ICD-10-CM

## 2020-03-13 LAB — TSH: TSH: 3.01 u[IU]/mL (ref 0.35–4.50)

## 2020-03-13 NOTE — Progress Notes (Signed)
Patient ID: Amber Richardson, female   DOB: August 11, 1955, 65 y.o.   MRN: [id number]   Subjective:    Patient ID: Amber Richardson, female    DOB: 1955-06-17, 65 y.o.   MRN: [id number]  HPI This visit occurred during the SARS-CoV-2 public health emergency.  Safety protocols were in place, including screening questions prior to the visit, additional usage of staff PPE, and extensive cleaning of exam room while observing appropriate contact time as indicated for disinfecting solutions.  Patient here for a scheduled follow up.  She has had persistent issues post covid infection. Had covid 01/2019.  She continues to have problems with short term memory loss, tremors and jerking sensation.  Had to start using a pill box - to be able to remember to take her medications daily and correctly.  Describes parasthesias and tremors persistent.  States she had her worse episode this past week.  Increased shaking and tremors.  Still reports increased fatigue.  Trouble finding words.  Underwent neuropsychological evaluation in 08/2019 - isolated weakness across verbal fluency and performance variability across executive functioning.  MRI brain - 10/2019 - normal.  NCV- EMG on 03/07/20 - normal. With the above, she is experiencing increased anxiety.  Seeing psychiatry.  Her breathing has improved, but still reports sob with exertion.  Seeing pulmonary.  Has albuterol inhaler if needed.  She is trying to start walking some to help build endurance.  She has noticed a persistent rash on her feet.  Saw dermatology.  Did not respond to antifungal or steroid medication.    Past Medical History:  Diagnosis Date  . Allergies   . Basal cell carcinoma   . Fatigue 08/30/2014  . Generalized anxiety disorder 03/21/2011  . GERD (gastroesophageal reflux disease)   . History of COVID-19 02/22/2019  . HLD (hyperlipidemia)   . 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 Hypercholesterolemia 02/08/2015  . Hyperglycemia 08/28/2019  . Hyperthyroidism   .  Major depressive disorder   . Osteoporosis 05/13/2015  . Pyelonephritis 1985  . Scalp, arm, and leg tingling 08/28/2019  . Tendinosis 09/12/2013   Rotator cuff tendinosis. Mild capsulitis.  . Tremor 08/28/2019   Subtle to mild, upper extremities, left worse than right   Past Surgical History:  Procedure Laterality Date  . DILATION AND CURETTAGE OF UTERUS    . Miscarriage     x1  . TONSILLECTOMY  1963   Family History  Problem Relation Age of Onset  . Hypertension Mother   . Hypercholesterolemia Mother   . Heart disease Father   . Hyperlipidemia Father   . Hypercholesterolemia Father   . Stroke Father   . Dementia Father        Vascular dementia related to stroke history   Social History   Socioeconomic History  . Marital status: Married    Spouse name: Not on file  . Number of children: 0  . Years of education: 16  . Highest education level: Bachelor's degree (e.g., BA, AB, BS)  Occupational History  . Not on file  Tobacco Use  . Smoking status: Never Smoker  . Smokeless tobacco: Never Used  Vaping Use  . Vaping Use: Never used  Substance and Sexual Activity  . Alcohol use: No    Alcohol/week: 0.0 standard drinks  . Drug use: No  . Sexual activity: Not on file  Other Topics Concern  . Not on file  Social History Narrative   Right handed   Drinks one cup coffee am and one pepsi  Social Determinants of Health   Financial Resource Strain: Not on file  Food Insecurity: Not on file  Transportation Needs: Not on file  Physical Activity: Not on file  Stress: Not on file  Social Connections: Not on file    Outpatient Encounter Medications as of 03/13/2020  Medication Sig  . albuterol (VENTOLIN HFA) 108 (90 Base) MCG/ACT inhaler TAKE 2 PUFFS BY MOUTH EVERY 6 HOURS AS NEEDED FOR WHEEZE OR SHORTNESS OF BREATH  . ALPRAZolam (XANAX) 0.5 MG tablet Take by mouth.  . Calcium Citrate-Vitamin D (CITRACAL + D PO) Take by mouth. Take one in the morning & 2 in the  afternoon  . DULoxetine (CYMBALTA) 20 MG capsule Take 20 mg by mouth daily.  . DULoxetine (CYMBALTA) 60 MG capsule Take 60 mg by mouth daily.  . fluticasone (FLONASE) 50 MCG/ACT nasal spray SPRAY 2 SPRAYS INTO EACH NOSTRIL EVERY DAY  . ibuprofen (ADVIL,MOTRIN) 600 MG tablet Take 600 mg by mouth as needed for pain.  Marland Kitchen ipratropium (ATROVENT) 0.03 % nasal spray PLACE 2 SPRAYS INTO BOTH NOSTRILS EVERY 12 (TWELVE) HOURS. (Patient not taking: Reported on 03/09/2020)  . levothyroxine (SYNTHROID) 75 MCG tablet Take 1 tablet (75 mcg total) by mouth daily before breakfast.  . Multiple Vitamin (MULTIVITAMIN) tablet Take 1 tablet by mouth daily.  Marland Kitchen Respiratory Therapy Supplies (FLUTTER) DEVI 1 each by Does not apply route 2 (two) times daily. (Patient not taking: Reported on 03/09/2020)  . traZODone (DESYREL) 50 MG tablet 50 mg. Takes 1/2 tab po prn  . valACYclovir (VALTREX) 500 MG tablet Take one to two tablets by mouth daily  . vitamin B-12 (CYANOCOBALAMIN) 1000 MCG tablet Take 1 tablet (1,000 mcg total) by mouth daily.  . [DISCONTINUED] ADVAIR DISKUS 250-50 MCG/DOSE AEPB TAKE 1 PUFF BY MOUTH TWICE A DAY (Patient not taking: Reported on 03/09/2020)  . [DISCONTINUED] budesonide-formoterol (SYMBICORT) 80-4.5 MCG/ACT inhaler Inhale 2 puffs into the lungs 2 (two) times daily.  . [DISCONTINUED] nystatin (MYCOSTATIN) 100000 UNIT/ML suspension Take 5 mLs (500,000 Units total) by mouth 2 (two) times daily. (Patient not taking: Reported on 03/09/2020)   No facility-administered encounter medications on file as of 03/13/2020.    Review of Systems  Constitutional: Positive for fatigue. Negative for unexpected weight change.  HENT: Negative for congestion and sinus pressure.   Respiratory: Negative for cough and chest tightness.        SOB with exertion.   Cardiovascular: Negative for chest pain, palpitations and leg swelling.  Gastrointestinal: Negative for abdominal pain, diarrhea, nausea and vomiting.   Genitourinary: Negative for difficulty urinating and dysuria.  Musculoskeletal: Negative for joint swelling and myalgias.  Skin: Positive for rash. Negative for color change.  Neurological: Positive for tremors and weakness. Negative for headaches.       Tremors and shaking as outlined.  Weakness.    Psychiatric/Behavioral: Negative for agitation and dysphoric mood.       Increased anxiety.         Objective:    Physical Exam Vitals reviewed.  Constitutional:      General: She is not in acute distress.    Appearance: Normal appearance.  HENT:     Head: Normocephalic and atraumatic.     Right Ear: External ear normal.     Left Ear: External ear normal.     Mouth/Throat:     Mouth: Oropharynx is clear and moist.  Eyes:     General: No scleral icterus.       Right eye: No  discharge.        Left eye: No discharge.  Neck:     Thyroid: No thyromegaly.  Cardiovascular:     Rate and Rhythm: Normal rate and regular rhythm.  Pulmonary:     Effort: No respiratory distress.     Breath sounds: Normal breath sounds. No wheezing.  Abdominal:     General: Bowel sounds are normal.     Palpations: Abdomen is soft.     Tenderness: There is no abdominal tenderness.  Musculoskeletal:        General: No swelling, tenderness or edema.     Cervical back: Neck supple. No tenderness.  Lymphadenopathy:     Cervical: No cervical adenopathy.  Skin:    Findings: No erythema.     Comments: Rash - bottom of feet.  No increased erythema.   Neurological:     Mental Status: She is alert.  Psychiatric:        Mood and Affect: Mood normal.        Behavior: Behavior normal.     BP 110/60   Pulse 67   Temp 97.8 F (36.6 C) (Oral)   Resp 16   Ht '5\' 8"'  (1.727 m)   Wt 169 lb (76.7 kg)   LMP 01/13/2008   SpO2 97%   BMI 25.70 kg/m  Wt Readings from Last 3 Encounters:  03/13/20 169 lb (76.7 kg)  03/09/20 170 lb (77.1 kg)  02/08/20 167 lb 9.6 oz (76 kg)     Lab Results  Component Value  Date   WBC 5.2 09/05/2019   HGB 13.0 09/05/2019   HCT 38.9 09/05/2019   PLT 218.0 09/05/2019   GLUCOSE 89 02/10/2020   CHOL 303 (H) 02/10/2020   TRIG 120.0 02/10/2020   HDL 80.30 02/10/2020   LDLDIRECT 165.6 03/23/2012   LDLCALC 199 (H) 02/10/2020   ALT 18 02/10/2020   AST 22 02/10/2020   NA 137 02/10/2020   K 4.3 02/10/2020   CL 102 02/10/2020   CREATININE 0.99 02/10/2020   BUN 19 02/10/2020   CO2 28 02/10/2020   TSH 3.01 03/13/2020   HGBA1C 5.7 02/10/2020    MR Brain W Wo Contrast  Result Date: 10/30/2019 CLINICAL DATA:  Tingling sensation in the scalp, face, arms and legs with upper extremity tremors and dizziness. Symptoms following COVID-19 infection in 01/2019. EXAM: MRI HEAD WITHOUT AND WITH CONTRAST TECHNIQUE: Multiplanar, multiecho pulse sequences of the brain and surrounding structures were obtained without and with intravenous contrast. CONTRAST:  7.37m GADAVIST GADOBUTROL 1 MMOL/ML IV SOLN COMPARISON:  07/11/2015 FINDINGS: Brain: There is no evidence of an acute infarct, intracranial hemorrhage, mass, midline shift, or extra-axial fluid collection. The ventricles and sulci are normal. No significant white matter disease is seen for age. No abnormal enhancement is identified. Vascular: Major intracranial vascular flow voids are preserved. Normal enhancement of the dural venous sinuses. Skull and upper cervical spine: Unremarkable bone marrow signal. Sinuses/Orbits: Unremarkable orbits. Paranasal sinuses and mastoid air cells are clear. Other: None. IMPRESSION: Unremarkable appearance of the brain. Electronically Signed   By: ALogan BoresM.D.   On: 10/30/2019 10:43       Assessment & Plan:   Problem List Items Addressed This Visit    Fatigue    Persistent.  Decreased stamina.  Has started walking.  Recheck tsh today.  Follow.       Relevant Orders   CBC with Differential/Platelet   Generalized anxiety disorder    Continue f/u psychiatry.  History of COVID-19     Since covid has had persistent sob and DOE.  Breathing is better now.  Has been followed by pulmonary.  Using albuterol prn.  Has had persistent problems with memory, tremors, parasthesias and trouble concentrating and focusing.  MRI normal.  NCS EMG normal.  Saw neurology. Note reviewed.  No clear neurological etiology found.  Continue f/u with psychiatry to help with anxiety.  Follow.        Hypercholesterolemia    Low cholesterol diet and exercise.  Follow lipid panel.       Relevant Orders   Lipid panel   Hepatic function panel   Basic metabolic panel   Hyperglycemia    Low carb diet. Trying to start back walking.  Follow met b and a1c.       Relevant Orders   Hemoglobin K5I   Basic metabolic panel   Hyperthyroidism - Primary    Off tapazole. On synthroid.  Was having trouble remembering to take her medication.  Has a pill box now. TSH elevated last check.  Recheck today to confirm wnl.        Relevant Orders   TSH (Completed)   TSH   Scalp, arm, and leg tingling    Persistent issues with parasthesias since covid.  Saw neurology.  Had w/up as outlined.        Stress    Increased stress and anxiety related to ongoing issues.  Continue to follow up with psychiatry.       Tremor    MRI normal.  NCSEMG - normal.  Saw neurology.  No definitive neurological explanation for her symptoms.  Started post covid.  Has been unable to work.            Einar Pheasant, MD

## 2020-03-15 DIAGNOSIS — Z1231 Encounter for screening mammogram for malignant neoplasm of breast: Secondary | ICD-10-CM | POA: Diagnosis not present

## 2020-03-15 DIAGNOSIS — Z01419 Encounter for gynecological examination (general) (routine) without abnormal findings: Secondary | ICD-10-CM | POA: Diagnosis not present

## 2020-03-15 DIAGNOSIS — M810 Age-related osteoporosis without current pathological fracture: Secondary | ICD-10-CM | POA: Diagnosis not present

## 2020-03-15 DIAGNOSIS — N952 Postmenopausal atrophic vaginitis: Secondary | ICD-10-CM | POA: Diagnosis not present

## 2020-03-15 LAB — HM PAP SMEAR

## 2020-03-18 ENCOUNTER — Encounter: Payer: Self-pay | Admitting: Internal Medicine

## 2020-03-18 NOTE — Assessment & Plan Note (Signed)
MRI normal.  NCSEMG - normal.  Saw neurology.  No definitive neurological explanation for her symptoms.  Started post covid.  Has been unable to work.

## 2020-03-18 NOTE — Assessment & Plan Note (Signed)
Increased stress and anxiety related to ongoing issues.  Continue to follow up with psychiatry.

## 2020-03-18 NOTE — Assessment & Plan Note (Signed)
Persistent.  Decreased stamina.  Has started walking.  Recheck tsh today.  Follow.

## 2020-03-18 NOTE — Assessment & Plan Note (Signed)
Persistent issues with parasthesias since covid.  Saw neurology.  Had w/up as outlined.

## 2020-03-18 NOTE — Assessment & Plan Note (Signed)
Since covid has had persistent sob and DOE.  Breathing is better now.  Has been followed by pulmonary.  Using albuterol prn.  Has had persistent problems with memory, tremors, parasthesias and trouble concentrating and focusing.  MRI normal.  NCS EMG normal.  Saw neurology. Note reviewed.  No clear neurological etiology found.  Continue f/u with psychiatry to help with anxiety.  Follow.

## 2020-03-18 NOTE — Assessment & Plan Note (Signed)
Low carb diet. Trying to start back walking.  Follow met b and a1c.

## 2020-03-18 NOTE — Assessment & Plan Note (Signed)
Continue f/u psychiatry.

## 2020-03-18 NOTE — Assessment & Plan Note (Signed)
Low cholesterol diet and exercise.  Follow lipid panel.   

## 2020-03-18 NOTE — Assessment & Plan Note (Signed)
Off tapazole. On synthroid.  Was having trouble remembering to take her medication.  Has a pill box now. TSH elevated last check.  Recheck today to confirm wnl.

## 2020-03-19 ENCOUNTER — Encounter: Payer: Self-pay | Admitting: Internal Medicine

## 2020-03-19 ENCOUNTER — Telehealth: Payer: Self-pay | Admitting: Internal Medicine

## 2020-03-19 NOTE — Telephone Encounter (Signed)
Patient called in to let you know that she put another form to be sign in [place]

## 2020-03-20 NOTE — Telephone Encounter (Signed)
See my chart message

## 2020-03-28 ENCOUNTER — Other Ambulatory Visit: Payer: Self-pay | Admitting: Internal Medicine

## 2020-03-29 ENCOUNTER — Encounter: Payer: Self-pay | Admitting: Internal Medicine

## 2020-03-29 MED ORDER — LEVOTHYROXINE SODIUM 75 MCG PO TABS: 75.0000 ug | ORAL_TABLET | Freq: Every day | ORAL | 1 refills | Status: DC

## 2020-04-17 DIAGNOSIS — R69 Illness, unspecified: Secondary | ICD-10-CM | POA: Diagnosis not present

## 2020-04-17 DIAGNOSIS — F4323 Adjustment disorder with mixed anxiety and depressed mood: Secondary | ICD-10-CM | POA: Diagnosis not present

## 2020-04-25 DIAGNOSIS — R69 Illness, unspecified: Secondary | ICD-10-CM | POA: Diagnosis not present

## 2020-05-17 DIAGNOSIS — F411 Generalized anxiety disorder: Secondary | ICD-10-CM | POA: Diagnosis not present

## 2020-05-17 DIAGNOSIS — R69 Illness, unspecified: Secondary | ICD-10-CM | POA: Diagnosis not present

## 2020-05-21 ENCOUNTER — Telehealth: Payer: Self-pay

## 2020-05-21 NOTE — Telephone Encounter (Signed)
Pt states that social security disability admin told her that they are waiting on a response from our office. She has questions about that. Please advise

## 2020-05-22 DIAGNOSIS — Z1231 Encounter for screening mammogram for malignant neoplasm of breast: Secondary | ICD-10-CM | POA: Diagnosis not present

## 2020-05-22 NOTE — Telephone Encounter (Signed)
LMTCB. Have not received any paperwork from anyone regarding this pt

## 2020-05-25 NOTE — Telephone Encounter (Signed)
Pt called returning your call 

## 2020-05-28 NOTE — Telephone Encounter (Signed)
Spoke with patient to let her know that I have not received anything for her disability. She thinks this request may have went  to our medical records department. She is going to reach out to lawyer today and let me know if there is anything further needed.

## 2020-06-06 ENCOUNTER — Telehealth: Payer: Self-pay | Admitting: Internal Medicine

## 2020-06-06 DIAGNOSIS — M2550 Pain in unspecified joint: Secondary | ICD-10-CM

## 2020-06-06 NOTE — Telephone Encounter (Signed)
Patient is coming into office to have labs done and she wanted Dr Nicki Reaper to also know she has more symptoms; joint pain all over, muscle weakness, she thinks the thyroid also needs to be checked. Please call patient if you have any questions.

## 2020-06-06 NOTE — Telephone Encounter (Signed)
Patient was wondering if she should have inflammatory markers checked with her labs due to her new symptoms? Symptoms started about a month ago (muscle weakness and joint pain all over.)

## 2020-06-06 NOTE — Telephone Encounter (Signed)
Inflammatory labs added.

## 2020-06-07 DIAGNOSIS — F4323 Adjustment disorder with mixed anxiety and depressed mood: Secondary | ICD-10-CM | POA: Diagnosis not present

## 2020-06-07 DIAGNOSIS — R69 Illness, unspecified: Secondary | ICD-10-CM | POA: Diagnosis not present

## 2020-06-11 ENCOUNTER — Other Ambulatory Visit: Payer: Self-pay

## 2020-06-11 ENCOUNTER — Other Ambulatory Visit (INDEPENDENT_AMBULATORY_CARE_PROVIDER_SITE_OTHER): Payer: 59

## 2020-06-11 DIAGNOSIS — M2550 Pain in unspecified joint: Secondary | ICD-10-CM

## 2020-06-11 DIAGNOSIS — E0590 Thyrotoxicosis, unspecified without thyrotoxic crisis or storm: Secondary | ICD-10-CM

## 2020-06-11 DIAGNOSIS — R5383 Other fatigue: Secondary | ICD-10-CM | POA: Diagnosis not present

## 2020-06-11 DIAGNOSIS — R739 Hyperglycemia, unspecified: Secondary | ICD-10-CM | POA: Diagnosis not present

## 2020-06-11 DIAGNOSIS — E7800 Pure hypercholesterolemia, unspecified: Secondary | ICD-10-CM | POA: Diagnosis not present

## 2020-06-11 LAB — HEPATIC FUNCTION PANEL
ALT: 19 U/L (ref 0–35)
AST: 21 U/L (ref 0–37)
Albumin: 4.5 g/dL (ref 3.5–5.2)
Alkaline Phosphatase: 126 U/L — ABNORMAL HIGH (ref 39–117)
Bilirubin, Direct: 0.1 mg/dL (ref 0.0–0.3)
Total Bilirubin: 0.6 mg/dL (ref 0.2–1.2)
Total Protein: 6.5 g/dL (ref 6.0–8.3)

## 2020-06-11 LAB — CBC WITH DIFFERENTIAL/PLATELET
Basophils Absolute: 0 10*3/uL (ref 0.0–0.1)
Basophils Relative: 0.6 % (ref 0.0–3.0)
Eosinophils Absolute: 0.1 10*3/uL (ref 0.0–0.7)
Eosinophils Relative: 2 % (ref 0.0–5.0)
HCT: 40.8 % (ref 36.0–46.0)
Hemoglobin: 13.8 g/dL (ref 12.0–15.0)
Lymphocytes Relative: 46 % (ref 12.0–46.0)
Lymphs Abs: 2.2 10*3/uL (ref 0.7–4.0)
MCHC: 33.8 g/dL (ref 30.0–36.0)
MCV: 95.7 fl (ref 78.0–100.0)
Monocytes Absolute: 0.4 10*3/uL (ref 0.1–1.0)
Monocytes Relative: 9.1 % (ref 3.0–12.0)
Neutro Abs: 2 10*3/uL (ref 1.4–7.7)
Neutrophils Relative %: 42.3 % — ABNORMAL LOW (ref 43.0–77.0)
Platelets: 215 10*3/uL (ref 150.0–400.0)
RBC: 4.27 Mil/uL (ref 3.87–5.11)
RDW: 13.2 % (ref 11.5–15.5)
WBC: 4.8 10*3/uL (ref 4.0–10.5)

## 2020-06-11 LAB — HEMOGLOBIN A1C: Hgb A1c MFr Bld: 5.8 % (ref 4.6–6.5)

## 2020-06-11 LAB — TSH: TSH: 3.96 u[IU]/mL (ref 0.35–4.50)

## 2020-06-11 LAB — LIPID PANEL
Cholesterol: 277 mg/dL — ABNORMAL HIGH (ref 0–200)
HDL: 78.9 mg/dL (ref >39.00)
LDL Cholesterol: 176 mg/dL — ABNORMAL HIGH (ref 0–99)
NonHDL: 198.54
Total CHOL/HDL Ratio: 4
Triglycerides: 112 mg/dL (ref 0.0–149.0)
VLDL: 22.4 mg/dL (ref 0.0–40.0)

## 2020-06-11 LAB — BASIC METABOLIC PANEL
BUN: 17 mg/dL (ref 6–23)
CO2: 27 mEq/L (ref 19–32)
Calcium: 9.3 mg/dL (ref 8.4–10.5)
Chloride: 104 mEq/L (ref 96–112)
Creatinine, Ser: 0.86 mg/dL (ref 0.40–1.20)
GFR: 72.41 mL/min (ref >60.00)
Glucose, Bld: 89 mg/dL (ref 70–99)
Potassium: 4.5 mEq/L (ref 3.5–5.1)
Sodium: 139 mEq/L (ref 135–145)

## 2020-06-11 LAB — C-REACTIVE PROTEIN: CRP: <1 mg/dL (ref 0.5–20.0)

## 2020-06-11 LAB — SEDIMENTATION RATE: Sed Rate: 18 mm/hr (ref 0–30)

## 2020-06-12 ENCOUNTER — Other Ambulatory Visit: Payer: Self-pay | Admitting: Internal Medicine

## 2020-06-12 DIAGNOSIS — E039 Hypothyroidism, unspecified: Secondary | ICD-10-CM

## 2020-06-12 NOTE — Progress Notes (Signed)
Order placed for f/u tsh.  

## 2020-06-13 ENCOUNTER — Ambulatory Visit (INDEPENDENT_AMBULATORY_CARE_PROVIDER_SITE_OTHER): Payer: 59

## 2020-06-13 ENCOUNTER — Ambulatory Visit (INDEPENDENT_AMBULATORY_CARE_PROVIDER_SITE_OTHER): Payer: 59 | Admitting: Internal Medicine

## 2020-06-13 ENCOUNTER — Other Ambulatory Visit: Payer: Self-pay

## 2020-06-13 VITALS — BP 110/72 | HR 62 | Temp 97.9°F | Resp 16 | Ht 68.0 in | Wt 165.0 lb

## 2020-06-13 DIAGNOSIS — R0989 Other specified symptoms and signs involving the circulatory and respiratory systems: Secondary | ICD-10-CM

## 2020-06-13 DIAGNOSIS — F411 Generalized anxiety disorder: Secondary | ICD-10-CM

## 2020-06-13 DIAGNOSIS — M6281 Muscle weakness (generalized): Secondary | ICD-10-CM

## 2020-06-13 DIAGNOSIS — F331 Major depressive disorder, recurrent, moderate: Secondary | ICD-10-CM

## 2020-06-13 DIAGNOSIS — R69 Illness, unspecified: Secondary | ICD-10-CM | POA: Diagnosis not present

## 2020-06-13 DIAGNOSIS — H539 Unspecified visual disturbance: Secondary | ICD-10-CM

## 2020-06-13 DIAGNOSIS — E7800 Pure hypercholesterolemia, unspecified: Secondary | ICD-10-CM

## 2020-06-13 DIAGNOSIS — R739 Hyperglycemia, unspecified: Secondary | ICD-10-CM

## 2020-06-13 DIAGNOSIS — R2681 Unsteadiness on feet: Secondary | ICD-10-CM

## 2020-06-13 DIAGNOSIS — M545 Low back pain, unspecified: Secondary | ICD-10-CM | POA: Diagnosis not present

## 2020-06-13 DIAGNOSIS — R5383 Other fatigue: Secondary | ICD-10-CM

## 2020-06-13 DIAGNOSIS — F439 Reaction to severe stress, unspecified: Secondary | ICD-10-CM

## 2020-06-13 DIAGNOSIS — M2560 Stiffness of unspecified joint, not elsewhere classified: Secondary | ICD-10-CM

## 2020-06-13 DIAGNOSIS — E0590 Thyrotoxicosis, unspecified without thyrotoxic crisis or storm: Secondary | ICD-10-CM | POA: Diagnosis not present

## 2020-06-13 DIAGNOSIS — R748 Abnormal levels of other serum enzymes: Secondary | ICD-10-CM | POA: Diagnosis not present

## 2020-06-13 MED ORDER — DOXYCYCLINE HYCLATE 100 MG PO TABS: 100.0000 mg | ORAL_TABLET | Freq: Two times a day (BID) | ORAL | 0 refills | Status: DC

## 2020-06-13 NOTE — Progress Notes (Signed)
Patient ID: Amber Richardson, female   DOB: [birth year], 65 y.o.   MRN: [id number]   Subjective:    Patient ID: Amber Richardson, female    DOB: 10/11/1955, 65 y.o.   MRN: [id number]  HPI This visit occurred during the SARS-CoV-2 public health emergency.  Safety protocols were in place, including screening questions prior to the visit, additional usage of staff PPE, and extensive cleaning of exam room while observing appropriate contact time as indicated for disinfecting solutions.  Patient here for a scheduled follow up.  Has been followed post covid.  She previously had been evaluated for increased fatigue, memory issues, weakness and sob.  See previous notes.  Saw neurology and seeing pulmonary.  Was started on B12 supplementation.  NCV-EMG 03/07/20 - normal with no evidence of neuropathy or muscle disease.  MRI brain - 10/30/19 - normal.  She continued to have exertional dyspnea.  Saw pulmonary.  PFTs - 04/2019 - normal.  Minimal scarring of the lungs on HRCT 04/2019.  Using as needed albuterol.  Last seen 01/2020.  Recommended f/u in 6 months.  She has been trying to get out more - walking.  Noticed over the last two weeks she has "taken a nose dive".  Describes joint pain - involving multiple joints.  Also reports feeling her muscles are weaker.  Pain behind her knees.  States hard to get up from a seated position.  Has to lean forward to get up.  Increased elbow pain when carrying a pot, etc.  Low back pain - pain across hip joints.  Some unsteadiness - weaving when she walks.  Has had a couple of episodes where she felt weak = possible near syncopal.  No actual syncope.  No significant headache.  No rash.  No known bites.  Temp 98.3.  No fever.  Also reports chest congestion  - with some cough. Eating.  No vomiting.  Does report having an episode where noticed - jagged light - eye.  Seeing ophthalmology previously.  Discussed the need for f/u with ophthalmology. Reports has noticed these changes in the last  2-3 weeks.     Past Medical History:  Diagnosis Date  . Allergies   . Basal cell carcinoma   . Fatigue 08/30/2014  . Generalized anxiety disorder 03/21/2011  . GERD (gastroesophageal reflux disease)   . History of COVID-19 02/22/2019  . HLD (hyperlipidemia)   . 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 Hypercholesterolemia 02/08/2015  . Hyperglycemia 08/28/2019  . Hyperthyroidism   . Major depressive disorder   . Osteoporosis 05/13/2015  . Pyelonephritis 1985  . Scalp, arm, and leg tingling 08/28/2019  . Tendinosis 09/12/2013   Rotator cuff tendinosis. Mild capsulitis.  . Tremor 08/28/2019   Subtle to mild, upper extremities, left worse than right   Past Surgical History:  Procedure Laterality Date  . DILATION AND CURETTAGE OF UTERUS    . Miscarriage     x1  . TONSILLECTOMY  1963   Family History  Problem Relation Age of Onset  . Hypertension Mother   . Hypercholesterolemia Mother   . Heart disease Father   . Hyperlipidemia Father   . Hypercholesterolemia Father   . Stroke Father   . Dementia Father        Vascular dementia related to stroke history   Social History   Socioeconomic History  . Marital status: Married    Spouse name: Not on file  . Number of children: 0  . Years of education: 58  . Highest education  level: Bachelor's degree (e.g., BA, AB, BS)  Occupational History  . Not on file  Tobacco Use  . Smoking status: Never Smoker  . Smokeless tobacco: Never Used  Vaping Use  . Vaping Use: Never used  Substance and Sexual Activity  . Alcohol use: No    Alcohol/week: 0.0 standard drinks  . Drug use: No  . Sexual activity: Not on file  Other Topics Concern  . Not on file  Social History Narrative   Right handed   Drinks one cup coffee am and one pepsi   Social Determinants of Health   Financial Resource Strain: Not on file  Food Insecurity: Not on file  Transportation Needs: Not on file  Physical Activity: Not on file  Stress: Not on file  Social  Connections: Not on file    Outpatient Encounter Medications as of 06/13/2020  Medication Sig  . doxycycline (VIBRA-TABS) 100 MG tablet Take 1 tablet (100 mg total) by mouth 2 (two) times daily.  Marland Kitchen albuterol (VENTOLIN HFA) 108 (90 Base) MCG/ACT inhaler TAKE 2 PUFFS BY MOUTH EVERY 6 HOURS AS NEEDED FOR WHEEZE OR SHORTNESS OF BREATH  . ALPRAZolam (XANAX) 0.5 MG tablet Take by mouth.  . Calcium Citrate-Vitamin D (CITRACAL + D PO) Take by mouth. Take one in the morning & 2 in the afternoon  . DULoxetine (CYMBALTA) 20 MG capsule Take 20 mg by mouth daily.  . DULoxetine (CYMBALTA) 60 MG capsule Take 60 mg by mouth daily.  . fluticasone (FLONASE) 50 MCG/ACT nasal spray SPRAY 2 SPRAYS INTO EACH NOSTRIL EVERY DAY  . ibuprofen (ADVIL,MOTRIN) 600 MG tablet Take 600 mg by mouth as needed for pain.  Marland Kitchen levothyroxine (SYNTHROID) 75 MCG tablet Take 1 tablet (75 mcg total) by mouth daily before breakfast.  . Multiple Vitamin (MULTIVITAMIN) tablet Take 1 tablet by mouth daily.  . traZODone (DESYREL) 50 MG tablet 50 mg. Takes 1/2 tab po prn  . valACYclovir (VALTREX) 500 MG tablet Take one to two tablets by mouth daily  . vitamin B-12 (CYANOCOBALAMIN) 1000 MCG tablet Take 1 tablet (1,000 mcg total) by mouth daily.  . [DISCONTINUED] ipratropium (ATROVENT) 0.03 % nasal spray PLACE 2 SPRAYS INTO BOTH NOSTRILS EVERY 12 (TWELVE) HOURS. (Patient not taking: Reported on 03/09/2020)  . [DISCONTINUED] Respiratory Therapy Supplies (FLUTTER) DEVI 1 each by Does not apply route 2 (two) times daily. (Patient not taking: Reported on 03/09/2020)   No facility-administered encounter medications on file as of 06/13/2020.    Review of Systems  Constitutional: Positive for fatigue. Negative for appetite change and unexpected weight change.  HENT: Negative for congestion and sinus pressure.   Respiratory: Negative for chest tightness.        Some increased congestion/cough as outlined.   Cardiovascular: Negative for chest pain,  palpitations and leg swelling.  Gastrointestinal: Negative for abdominal pain, diarrhea, nausea and vomiting.  Genitourinary: Negative for difficulty urinating and dysuria.  Musculoskeletal:       Muscle weakness and joint pain as outlined.    Skin: Negative for color change and rash.  Neurological: Negative for headaches.       Unsteadiness as outlined.  Vision change as outlined - no current symptoms.    Psychiatric/Behavioral: Negative for agitation and dysphoric mood.       Objective:    Physical Exam Vitals reviewed.  Constitutional:      General: She is not in acute distress.    Appearance: Normal appearance.  HENT:     Head: Normocephalic and  atraumatic.     Right Ear: External ear normal.     Left Ear: External ear normal.  Eyes:     General: No scleral icterus.       Right eye: No discharge.        Left eye: No discharge.     Conjunctiva/sclera: Conjunctivae normal.  Neck:     Thyroid: No thyromegaly.  Cardiovascular:     Rate and Rhythm: Normal rate and regular rhythm.  Pulmonary:     Effort: No respiratory distress.     Breath sounds: Normal breath sounds. No wheezing.  Abdominal:     General: Bowel sounds are normal.     Palpations: Abdomen is soft.     Tenderness: There is no abdominal tenderness.  Musculoskeletal:        General: No swelling or tenderness.     Cervical back: Neck supple. No tenderness.     Comments: Motor strength - appears to be equal and normal upper and lower extremities.  Grip strength normal.    Lymphadenopathy:     Cervical: No cervical adenopathy.  Skin:    Findings: No erythema or rash.  Neurological:     Mental Status: She is alert.     Comments: FTN - intact.  Negative rhomberg.  Able to walk in the room and turn without increased unsteadiness.   Psychiatric:        Mood and Affect: Mood normal.        Behavior: Behavior normal.     BP 110/72   Pulse 62   Temp 97.9 F (36.6 C) (Temporal)   Resp 16   Ht _0  (1.727  m)   Wt 165 lb (74.8 kg)   LMP 01/13/2008   SpO2 98%   BMI 25.09 kg/m  Wt Readings from Last 3 Encounters:  06/13/20 165 lb (74.8 kg)  03/13/20 169 lb (76.7 kg)  03/09/20 170 lb (77.1 kg)     Lab Results  Component Value Date   WBC 4.8 06/11/2020   HGB 13.8 06/11/2020   HCT 40.8 06/11/2020   PLT 215.0 06/11/2020   GLUCOSE 89 06/11/2020   CHOL 277 (H) 06/11/2020   TRIG 112.0 06/11/2020   HDL 78.90 06/11/2020   LDLDIRECT 165.6 03/23/2012   LDLCALC 176 (H) 06/11/2020   ALT 19 06/13/2020   AST 22 06/13/2020   NA 139 06/11/2020   K 4.5 06/11/2020   CL 104 06/11/2020   CREATININE 0.86 06/11/2020   BUN 17 06/11/2020   CO2 27 06/11/2020   TSH 3.96 06/11/2020   HGBA1C 5.8 06/11/2020    MR Brain W Wo Contrast  Result Date: 10/30/2019 CLINICAL DATA:  Tingling sensation in the scalp, face, arms and legs with upper extremity tremors and dizziness. Symptoms following COVID-19 infection in 01/2019. EXAM: MRI HEAD WITHOUT AND WITH CONTRAST TECHNIQUE: Multiplanar, multiecho pulse sequences of the brain and surrounding structures were obtained without and with intravenous contrast. CONTRAST:  7.74m GADAVIST GADOBUTROL 1 MMOL/ML IV SOLN COMPARISON:  07/11/2015 FINDINGS: Brain: There is no evidence of an acute infarct, intracranial hemorrhage, mass, midline shift, or extra-axial fluid collection. The ventricles and sulci are normal. No significant white matter disease is seen for age. No abnormal enhancement is identified. Vascular: Major intracranial vascular flow voids are preserved. Normal enhancement of the dural venous sinuses. Skull and upper cervical spine: Unremarkable bone marrow signal. Sinuses/Orbits: Unremarkable orbits. Paranasal sinuses and mastoid air cells are clear. Other: None. IMPRESSION: Unremarkable appearance of the brain. Electronically  Signed   By: Logan Bores M.D.   On: 10/30/2019 10:43       Assessment & Plan:   Problem List Items Addressed This Visit    Change in  vision    Noticed the jagged - light change - eye.  Recommended f/u with ophthalmology. Obtain MRI as outlined.       Relevant Orders   Ambulatory referral to Neurology   MR Brain W Wo Contrast   Chest congestion    Chest congestion with some cough.  Persistent.  Concern regarding URI.  Treat - robitussin/mucinex.  Doxycycline as directed.  Follow.  Notify me or be reevaluated if symptoms change,worsen or do not resolve.        Fatigue    Does report persistent fatigue.  Check labs as outlined.        Generalized anxiety disorder    Followed by psychiatry.       Hypercholesterolemia    Low cholesterol diet and exercise as tolerated.  Follow.       Hyperglycemia    Low carb diet and exercise.  Follow met b and a1c.       Hyperthyroidism    Off tapazole.  On synthroid now.  Follow tsh.       Joint stiffness    Joint stiffness - as outlined - multiple joints.  No synovitis.  No rash.  No increased fever or headache.  Will check lyme titer, RMSF and esr/crp and ck.  Further w/up pending results.        Relevant Orders   Ambulatory referral to Neurology   Low back pain    Low back pain and unsteadiness as outlined.  Notes legs feel weaker.  Needs to rock or use arms to stand.  Cannot appreciate definite weakness, specifically no proximal muscle weakness noted.  Will check L-S spine xray.  MRI given recent change.  Neurology evaluation discussed.       Relevant Orders   DG Lumbar Spine 2-3 Views (Completed)   Moderate episode of recurrent major depressive disorder ([place])    Followed by psychiatry.       Muscle weakness    Describes muscle weakness and unsteadiness as outlined.  Change within the last 2-3 weeks.  Exam as outlined.  Also describes joint pain.  No rash.  No significant fever.  No headache.  Will check for Lymes and RMSF.  Discussed further w/up and evaluation.  Previous NCV- EMG normal.  Current symptoms are new.  Discussed further w/up and evaluation.   Discussed MRI. She is agreeable.  Discussed f/u with neurology. She request referral to [facility] neurology.  Follow symptoms.  If any acute change or worsening problems, will need to be evaluated.  Check ck and esr.        Relevant Orders   B. burgdorfi antibodies (Completed)   Rocky mtn spotted fvr ab, IgG-blood (Completed)   Rocky mtn spotted fvr ab, IgM-blood (Completed)   CK (Creatine Kinase) (Completed)   Ambulatory referral to Neurology   MR Brain W Wo Contrast   Stress    Continues to follow up with psychiatry.  Overall appears to be stable.       Unsteady gait    Noticed unsteady gait and weaving when walking.  Low back pain.  Joint stiffness. Unclear etiology.  Given acute change and now difficulty standing, etc - will obtain MRI brain.  Also check L-S spine and labs as outlined.  Relevant Orders   MR Brain W Wo Contrast    Other Visit Diagnoses    Elevated alkaline phosphatase level    -  Primary   Relevant Orders   Hepatic function panel (Completed)     I spent 45 minutes with the patient and more than 50% of the time was spent in consultation regarding the above.  Time spent discussing her current concerns and symptoms.  Time also spent discussing treatment and further w/up.   Einar Pheasant, MD

## 2020-06-14 DIAGNOSIS — R69 Illness, unspecified: Secondary | ICD-10-CM | POA: Diagnosis not present

## 2020-06-14 DIAGNOSIS — Z1283 Encounter for screening for malignant neoplasm of skin: Secondary | ICD-10-CM | POA: Diagnosis not present

## 2020-06-14 DIAGNOSIS — L111 Transient acantholytic dermatosis [Grover]: Secondary | ICD-10-CM | POA: Diagnosis not present

## 2020-06-14 DIAGNOSIS — D485 Neoplasm of uncertain behavior of skin: Secondary | ICD-10-CM | POA: Diagnosis not present

## 2020-06-14 DIAGNOSIS — Z85828 Personal history of other malignant neoplasm of skin: Secondary | ICD-10-CM | POA: Diagnosis not present

## 2020-06-14 DIAGNOSIS — L578 Other skin changes due to chronic exposure to nonionizing radiation: Secondary | ICD-10-CM | POA: Diagnosis not present

## 2020-06-14 DIAGNOSIS — L570 Actinic keratosis: Secondary | ICD-10-CM | POA: Diagnosis not present

## 2020-06-14 LAB — B. BURGDORFI ANTIBODIES: B burgdorferi Ab IgG+IgM: <0.9 index

## 2020-06-14 LAB — HEPATIC FUNCTION PANEL
ALT: 19 U/L (ref 0–35)
AST: 22 U/L (ref 0–37)
Albumin: 4.6 g/dL (ref 3.5–5.2)
Alkaline Phosphatase: 129 U/L — ABNORMAL HIGH (ref 39–117)
Bilirubin, Direct: 0.1 mg/dL (ref 0.0–0.3)
Total Bilirubin: 0.5 mg/dL (ref 0.2–1.2)
Total Protein: 6.8 g/dL (ref 6.0–8.3)

## 2020-06-14 LAB — CK: Total CK: 149 U/L (ref 7–177)

## 2020-06-15 LAB — ROCKY MTN SPOTTED FVR AB, IGM-BLOOD: RMSF IgM: 0.22 index (ref 0.00–0.89)

## 2020-06-15 LAB — ROCKY MTN SPOTTED FVR AB, IGG-BLOOD: RMSF IgG: NEGATIVE

## 2020-06-17 ENCOUNTER — Encounter: Payer: Self-pay | Admitting: Internal Medicine

## 2020-06-17 DIAGNOSIS — R2681 Unsteadiness on feet: Secondary | ICD-10-CM | POA: Insufficient documentation

## 2020-06-17 DIAGNOSIS — M2560 Stiffness of unspecified joint, not elsewhere classified: Secondary | ICD-10-CM | POA: Insufficient documentation

## 2020-06-17 DIAGNOSIS — F331 Major depressive disorder, recurrent, moderate: Secondary | ICD-10-CM | POA: Insufficient documentation

## 2020-06-17 DIAGNOSIS — H539 Unspecified visual disturbance: Secondary | ICD-10-CM | POA: Insufficient documentation

## 2020-06-17 DIAGNOSIS — R0989 Other specified symptoms and signs involving the circulatory and respiratory systems: Secondary | ICD-10-CM | POA: Insufficient documentation

## 2020-06-17 NOTE — Assessment & Plan Note (Signed)
Low carb diet and exercise.  Follow met b and a1c.  

## 2020-06-17 NOTE — Assessment & Plan Note (Signed)
Does report persistent fatigue.  Check labs as outlined.

## 2020-06-17 NOTE — Assessment & Plan Note (Signed)
Low cholesterol diet and exercise as tolerated.  Follow.  

## 2020-06-17 NOTE — Assessment & Plan Note (Signed)
Noticed unsteady gait and weaving when walking.  Low back pain.  Joint stiffness. Unclear etiology.  Given acute change and now difficulty standing, etc - will obtain MRI brain.  Also check L-S spine and labs as outlined.

## 2020-06-17 NOTE — Assessment & Plan Note (Signed)
Chest congestion with some cough.  Persistent.  Concern regarding URI.  Treat - robitussin/mucinex.  Doxycycline as directed.  Follow.  Notify me or be reevaluated if symptoms change,worsen or do not resolve.

## 2020-06-17 NOTE — Assessment & Plan Note (Signed)
Joint stiffness - as outlined - multiple joints.  No synovitis.  No rash.  No increased fever or headache.  Will check lyme titer, RMSF and esr/crp and ck.  Further w/up pending results.

## 2020-06-17 NOTE — Assessment & Plan Note (Signed)
Followed by psychiatry 

## 2020-06-17 NOTE — Assessment & Plan Note (Signed)
Noticed the jagged - light change - eye.  Recommended f/u with ophthalmology. Obtain MRI as outlined.

## 2020-06-17 NOTE — Assessment & Plan Note (Signed)
Off tapazole.  On synthroid now.  Follow tsh.  

## 2020-06-17 NOTE — Assessment & Plan Note (Signed)
Low back pain and unsteadiness as outlined.  Notes legs feel weaker.  Needs to rock or use arms to stand.  Cannot appreciate definite weakness, specifically no proximal muscle weakness noted.  Will check L-S spine xray.  MRI given recent change.  Neurology evaluation discussed.

## 2020-06-17 NOTE — Assessment & Plan Note (Addendum)
Describes muscle weakness and unsteadiness as outlined.  Change within the last 2-3 weeks.  Exam as outlined.  Also describes joint pain.  No rash.  No significant fever.  No headache.  Will check for Lymes and RMSF.  Discussed further w/up and evaluation.  Previous NCV- EMG normal.  Current symptoms are new.  Discussed further w/up and evaluation.  Discussed MRI. She is agreeable.  Discussed f/u with neurology. She request referral to [facility] neurology.  Follow symptoms.  If any acute change or worsening problems, will need to be evaluated.  Check ck and esr.

## 2020-06-17 NOTE — Assessment & Plan Note (Signed)
Continues to follow up with psychiatry.  Overall appears to be stable.

## 2020-06-20 ENCOUNTER — Ambulatory Visit
Admission: RE | Admit: 2020-06-20 | Discharge: 2020-06-20 | Disposition: A | Discharge status: Home / Self Care | Payer: 59 | Source: Ambulatory Visit | Attending: Internal Medicine | Admitting: Internal Medicine

## 2020-06-20 ENCOUNTER — Other Ambulatory Visit: Payer: Self-pay

## 2020-06-20 DIAGNOSIS — H539 Unspecified visual disturbance: Secondary | ICD-10-CM | POA: Insufficient documentation

## 2020-06-20 DIAGNOSIS — M6281 Muscle weakness (generalized): Secondary | ICD-10-CM | POA: Diagnosis not present

## 2020-06-20 DIAGNOSIS — R42 Dizziness and giddiness: Secondary | ICD-10-CM | POA: Diagnosis not present

## 2020-06-20 DIAGNOSIS — R2681 Unsteadiness on feet: Secondary | ICD-10-CM | POA: Diagnosis not present

## 2020-06-20 MED ORDER — GADOBUTROL 1 MMOL/ML IV SOLN
7.5000 mL | Freq: Once | INTRAVENOUS | Status: AC | PRN
  Administered 2020-06-20: 7.5 mL via INTRAVENOUS

## 2020-06-22 ENCOUNTER — Encounter: Payer: Self-pay | Admitting: Internal Medicine

## 2020-06-22 DIAGNOSIS — R2681 Unsteadiness on feet: Secondary | ICD-10-CM

## 2020-06-22 DIAGNOSIS — M545 Low back pain, unspecified: Secondary | ICD-10-CM

## 2020-06-22 NOTE — Telephone Encounter (Signed)
Order placed for PT referral.  

## 2020-06-27 DIAGNOSIS — M810 Age-related osteoporosis without current pathological fracture: Secondary | ICD-10-CM | POA: Diagnosis not present

## 2020-06-28 DIAGNOSIS — D0461 Carcinoma in situ of skin of right upper limb, including shoulder: Secondary | ICD-10-CM | POA: Diagnosis not present

## 2020-07-05 ENCOUNTER — Telehealth: Payer: Self-pay | Admitting: Internal Medicine

## 2020-07-05 DIAGNOSIS — M545 Low back pain, unspecified: Secondary | ICD-10-CM | POA: Diagnosis not present

## 2020-07-05 DIAGNOSIS — M25561 Pain in right knee: Secondary | ICD-10-CM | POA: Diagnosis not present

## 2020-07-05 DIAGNOSIS — M25552 Pain in left hip: Secondary | ICD-10-CM | POA: Diagnosis not present

## 2020-07-05 DIAGNOSIS — M25562 Pain in left knee: Secondary | ICD-10-CM | POA: Diagnosis not present

## 2020-07-05 DIAGNOSIS — R531 Weakness: Secondary | ICD-10-CM

## 2020-07-05 DIAGNOSIS — M2560 Stiffness of unspecified joint, not elsewhere classified: Secondary | ICD-10-CM

## 2020-07-05 DIAGNOSIS — M25551 Pain in right hip: Secondary | ICD-10-CM | POA: Diagnosis not present

## 2020-07-05 NOTE — Telephone Encounter (Signed)
Are you ok to do referral to OT?

## 2020-07-05 NOTE — Telephone Encounter (Signed)
Patient just left PT and was told she needs OT. Patient requesting a order for OT.

## 2020-07-06 NOTE — Telephone Encounter (Signed)
LMTCB

## 2020-07-06 NOTE — Addendum Note (Signed)
Addended by: Alisa Graff on: 07/06/2020 10:13 PM   Modules accepted: Orders

## 2020-07-06 NOTE — Telephone Encounter (Signed)
Order placed for referral to OT.

## 2020-07-06 NOTE — Telephone Encounter (Signed)
Referral needed for weakness and joint stiffness.

## 2020-07-11 DIAGNOSIS — M25552 Pain in left hip: Secondary | ICD-10-CM | POA: Diagnosis not present

## 2020-07-11 DIAGNOSIS — M545 Low back pain, unspecified: Secondary | ICD-10-CM | POA: Diagnosis not present

## 2020-07-11 DIAGNOSIS — M25551 Pain in right hip: Secondary | ICD-10-CM | POA: Diagnosis not present

## 2020-07-11 DIAGNOSIS — M25562 Pain in left knee: Secondary | ICD-10-CM | POA: Diagnosis not present

## 2020-07-11 DIAGNOSIS — M25561 Pain in right knee: Secondary | ICD-10-CM | POA: Diagnosis not present

## 2020-07-16 DIAGNOSIS — M545 Low back pain, unspecified: Secondary | ICD-10-CM | POA: Diagnosis not present

## 2020-07-16 DIAGNOSIS — M25551 Pain in right hip: Secondary | ICD-10-CM | POA: Diagnosis not present

## 2020-07-16 DIAGNOSIS — M25561 Pain in right knee: Secondary | ICD-10-CM | POA: Diagnosis not present

## 2020-07-16 DIAGNOSIS — M25552 Pain in left hip: Secondary | ICD-10-CM | POA: Diagnosis not present

## 2020-07-16 DIAGNOSIS — M25562 Pain in left knee: Secondary | ICD-10-CM | POA: Diagnosis not present

## 2020-07-18 ENCOUNTER — Encounter: Payer: Self-pay | Admitting: Internal Medicine

## 2020-07-18 DIAGNOSIS — M25552 Pain in left hip: Secondary | ICD-10-CM | POA: Diagnosis not present

## 2020-07-18 DIAGNOSIS — M545 Low back pain, unspecified: Secondary | ICD-10-CM | POA: Diagnosis not present

## 2020-07-18 DIAGNOSIS — M25551 Pain in right hip: Secondary | ICD-10-CM | POA: Diagnosis not present

## 2020-07-18 DIAGNOSIS — M25561 Pain in right knee: Secondary | ICD-10-CM | POA: Diagnosis not present

## 2020-07-18 DIAGNOSIS — M25562 Pain in left knee: Secondary | ICD-10-CM | POA: Diagnosis not present

## 2020-07-18 NOTE — Telephone Encounter (Signed)
Printed form and placed on my desk to complete once I return on Monday.

## 2020-07-20 DIAGNOSIS — N3001 Acute cystitis with hematuria: Secondary | ICD-10-CM | POA: Diagnosis not present

## 2020-07-20 DIAGNOSIS — R300 Dysuria: Secondary | ICD-10-CM | POA: Diagnosis not present

## 2020-07-23 DIAGNOSIS — M25561 Pain in right knee: Secondary | ICD-10-CM | POA: Diagnosis not present

## 2020-07-23 DIAGNOSIS — M25551 Pain in right hip: Secondary | ICD-10-CM | POA: Diagnosis not present

## 2020-07-23 DIAGNOSIS — M25562 Pain in left knee: Secondary | ICD-10-CM | POA: Diagnosis not present

## 2020-07-23 DIAGNOSIS — R69 Illness, unspecified: Secondary | ICD-10-CM | POA: Diagnosis not present

## 2020-07-23 DIAGNOSIS — M25552 Pain in left hip: Secondary | ICD-10-CM | POA: Diagnosis not present

## 2020-07-23 DIAGNOSIS — M545 Low back pain, unspecified: Secondary | ICD-10-CM | POA: Diagnosis not present

## 2020-07-24 NOTE — Telephone Encounter (Signed)
Form completed and placed on box.

## 2020-07-24 NOTE — Telephone Encounter (Signed)
Pt called o follow up on FMLA form, she said that it is due on 07/26/20

## 2020-07-24 NOTE — Telephone Encounter (Signed)
Form completed and placed out for signature

## 2020-07-25 DIAGNOSIS — M810 Age-related osteoporosis without current pathological fracture: Secondary | ICD-10-CM | POA: Diagnosis not present

## 2020-07-25 DIAGNOSIS — R748 Abnormal levels of other serum enzymes: Secondary | ICD-10-CM | POA: Diagnosis not present

## 2020-07-26 DIAGNOSIS — M25561 Pain in right knee: Secondary | ICD-10-CM | POA: Diagnosis not present

## 2020-07-26 DIAGNOSIS — M25551 Pain in right hip: Secondary | ICD-10-CM | POA: Diagnosis not present

## 2020-07-26 DIAGNOSIS — M25562 Pain in left knee: Secondary | ICD-10-CM | POA: Diagnosis not present

## 2020-07-26 DIAGNOSIS — M545 Low back pain, unspecified: Secondary | ICD-10-CM | POA: Diagnosis not present

## 2020-07-26 DIAGNOSIS — M25552 Pain in left hip: Secondary | ICD-10-CM | POA: Diagnosis not present

## 2020-08-02 ENCOUNTER — Telehealth: Payer: Self-pay | Admitting: Internal Medicine

## 2020-08-02 DIAGNOSIS — M25551 Pain in right hip: Secondary | ICD-10-CM | POA: Diagnosis not present

## 2020-08-02 DIAGNOSIS — M25552 Pain in left hip: Secondary | ICD-10-CM | POA: Diagnosis not present

## 2020-08-02 DIAGNOSIS — M25562 Pain in left knee: Secondary | ICD-10-CM | POA: Diagnosis not present

## 2020-08-02 DIAGNOSIS — M545 Low back pain, unspecified: Secondary | ICD-10-CM | POA: Diagnosis not present

## 2020-08-02 DIAGNOSIS — M25561 Pain in right knee: Secondary | ICD-10-CM | POA: Diagnosis not present

## 2020-08-02 NOTE — Telephone Encounter (Signed)
[facility] called to state that they have orders for the PT for the patient but no orders for the OT that the Patient is stating their should be. She would like for them to be faxed over to them at [phone] with ATTN:Erin Wilson on it.

## 2020-08-02 NOTE — Telephone Encounter (Signed)
Fax did not go through to the number provided. Orders have been faxed to [facility] at [phone]. Fax confirmed to go through.

## 2020-08-02 NOTE — Telephone Encounter (Signed)
Ambulatory refrerral for Occupational therapy has been faxed to [facility] at number provided. Attention has been made to Amber Richardson

## 2020-08-07 ENCOUNTER — Telehealth: Payer: Self-pay | Admitting: Internal Medicine

## 2020-08-07 DIAGNOSIS — M25561 Pain in right knee: Secondary | ICD-10-CM | POA: Diagnosis not present

## 2020-08-07 DIAGNOSIS — M25562 Pain in left knee: Secondary | ICD-10-CM | POA: Diagnosis not present

## 2020-08-07 DIAGNOSIS — M25552 Pain in left hip: Secondary | ICD-10-CM | POA: Diagnosis not present

## 2020-08-07 DIAGNOSIS — M545 Low back pain, unspecified: Secondary | ICD-10-CM | POA: Diagnosis not present

## 2020-08-07 DIAGNOSIS — M25551 Pain in right hip: Secondary | ICD-10-CM | POA: Diagnosis not present

## 2020-08-07 NOTE — Telephone Encounter (Signed)
PT called to request a increase in her levothyroxine (SYNTHROID) 75 MCG tablet to 88 MCG as her endo doctor advise her to request a increase

## 2020-08-08 DIAGNOSIS — F4323 Adjustment disorder with mixed anxiety and depressed mood: Secondary | ICD-10-CM | POA: Diagnosis not present

## 2020-08-08 DIAGNOSIS — M25551 Pain in right hip: Secondary | ICD-10-CM | POA: Diagnosis not present

## 2020-08-08 DIAGNOSIS — M545 Low back pain, unspecified: Secondary | ICD-10-CM | POA: Diagnosis not present

## 2020-08-08 DIAGNOSIS — M25552 Pain in left hip: Secondary | ICD-10-CM | POA: Diagnosis not present

## 2020-08-08 DIAGNOSIS — M25561 Pain in right knee: Secondary | ICD-10-CM | POA: Diagnosis not present

## 2020-08-08 DIAGNOSIS — M25562 Pain in left knee: Secondary | ICD-10-CM | POA: Diagnosis not present

## 2020-08-08 DIAGNOSIS — R69 Illness, unspecified: Secondary | ICD-10-CM | POA: Diagnosis not present

## 2020-08-08 NOTE — Telephone Encounter (Signed)
Need a little more information.  If she is seeing endocrinology and they wanted increased, they usually send in new rx.  Did they do another thyroid test?  Need records and latest lab indicating need for change in thyroid dose.  Thanks

## 2020-08-09 NOTE — Telephone Encounter (Signed)
Called and spoke to Amber Richardson. Informed her that we need the recent note and labs from her endocrinology to move forward with the increase. She states that she will call them and have them send over the information. Waiting on information from Endocrinology.

## 2020-08-09 NOTE — Telephone Encounter (Signed)
Left a message to call back.

## 2020-08-13 DIAGNOSIS — M25641 Stiffness of right hand, not elsewhere classified: Secondary | ICD-10-CM | POA: Diagnosis not present

## 2020-08-13 DIAGNOSIS — M25642 Stiffness of left hand, not elsewhere classified: Secondary | ICD-10-CM | POA: Diagnosis not present

## 2020-08-13 DIAGNOSIS — M6281 Muscle weakness (generalized): Secondary | ICD-10-CM | POA: Diagnosis not present

## 2020-08-13 DIAGNOSIS — R748 Abnormal levels of other serum enzymes: Secondary | ICD-10-CM | POA: Diagnosis not present

## 2020-08-16 DIAGNOSIS — M6281 Muscle weakness (generalized): Secondary | ICD-10-CM | POA: Diagnosis not present

## 2020-08-16 DIAGNOSIS — M25551 Pain in right hip: Secondary | ICD-10-CM | POA: Diagnosis not present

## 2020-08-16 DIAGNOSIS — M25562 Pain in left knee: Secondary | ICD-10-CM | POA: Diagnosis not present

## 2020-08-16 DIAGNOSIS — M25561 Pain in right knee: Secondary | ICD-10-CM | POA: Diagnosis not present

## 2020-08-16 DIAGNOSIS — M545 Low back pain, unspecified: Secondary | ICD-10-CM | POA: Diagnosis not present

## 2020-08-16 DIAGNOSIS — M25641 Stiffness of right hand, not elsewhere classified: Secondary | ICD-10-CM | POA: Diagnosis not present

## 2020-08-16 DIAGNOSIS — M25552 Pain in left hip: Secondary | ICD-10-CM | POA: Diagnosis not present

## 2020-08-16 DIAGNOSIS — M25642 Stiffness of left hand, not elsewhere classified: Secondary | ICD-10-CM | POA: Diagnosis not present

## 2020-08-17 DIAGNOSIS — M810 Age-related osteoporosis without current pathological fracture: Secondary | ICD-10-CM | POA: Diagnosis not present

## 2020-08-23 DIAGNOSIS — M25551 Pain in right hip: Secondary | ICD-10-CM | POA: Diagnosis not present

## 2020-08-23 DIAGNOSIS — M25642 Stiffness of left hand, not elsewhere classified: Secondary | ICD-10-CM | POA: Diagnosis not present

## 2020-08-23 DIAGNOSIS — M25562 Pain in left knee: Secondary | ICD-10-CM | POA: Diagnosis not present

## 2020-08-23 DIAGNOSIS — M25552 Pain in left hip: Secondary | ICD-10-CM | POA: Diagnosis not present

## 2020-08-23 DIAGNOSIS — M6281 Muscle weakness (generalized): Secondary | ICD-10-CM | POA: Diagnosis not present

## 2020-08-23 DIAGNOSIS — M545 Low back pain, unspecified: Secondary | ICD-10-CM | POA: Diagnosis not present

## 2020-08-23 DIAGNOSIS — M25561 Pain in right knee: Secondary | ICD-10-CM | POA: Diagnosis not present

## 2020-08-23 DIAGNOSIS — M25641 Stiffness of right hand, not elsewhere classified: Secondary | ICD-10-CM | POA: Diagnosis not present

## 2020-08-27 ENCOUNTER — Ambulatory Visit: Payer: 59 | Admitting: Internal Medicine

## 2020-08-30 DIAGNOSIS — M25641 Stiffness of right hand, not elsewhere classified: Secondary | ICD-10-CM | POA: Diagnosis not present

## 2020-08-30 DIAGNOSIS — M6281 Muscle weakness (generalized): Secondary | ICD-10-CM | POA: Diagnosis not present

## 2020-08-30 DIAGNOSIS — M25642 Stiffness of left hand, not elsewhere classified: Secondary | ICD-10-CM | POA: Diagnosis not present

## 2020-09-03 ENCOUNTER — Ambulatory Visit: Payer: Self-pay | Admitting: Neurology

## 2020-09-03 DIAGNOSIS — M25642 Stiffness of left hand, not elsewhere classified: Secondary | ICD-10-CM | POA: Diagnosis not present

## 2020-09-03 DIAGNOSIS — M6281 Muscle weakness (generalized): Secondary | ICD-10-CM | POA: Diagnosis not present

## 2020-09-03 DIAGNOSIS — M25641 Stiffness of right hand, not elsewhere classified: Secondary | ICD-10-CM | POA: Diagnosis not present

## 2020-09-04 ENCOUNTER — Telehealth: Payer: Self-pay | Admitting: Internal Medicine

## 2020-09-04 DIAGNOSIS — R69 Illness, unspecified: Secondary | ICD-10-CM | POA: Diagnosis not present

## 2020-09-04 DIAGNOSIS — F4323 Adjustment disorder with mixed anxiety and depressed mood: Secondary | ICD-10-CM | POA: Diagnosis not present

## 2020-09-04 NOTE — Telephone Encounter (Signed)
Pt is requesting a sooner appt for joint pain

## 2020-09-05 DIAGNOSIS — M25642 Stiffness of left hand, not elsewhere classified: Secondary | ICD-10-CM | POA: Diagnosis not present

## 2020-09-05 DIAGNOSIS — M25641 Stiffness of right hand, not elsewhere classified: Secondary | ICD-10-CM | POA: Diagnosis not present

## 2020-09-05 DIAGNOSIS — M6281 Muscle weakness (generalized): Secondary | ICD-10-CM | POA: Diagnosis not present

## 2020-09-05 NOTE — Telephone Encounter (Signed)
LMTCB

## 2020-09-06 NOTE — Telephone Encounter (Signed)
LMTCB

## 2020-09-06 NOTE — Telephone Encounter (Signed)
Patient is returning your call from earlier.She would like for you to call her in the morning.

## 2020-09-07 NOTE — Telephone Encounter (Signed)
FYI to both Dr Nicki Reaper and doc of the day  Patient called in requesting to see Dr Nicki Reaper and get a prednisone taper for the pain and inflammation in her joints. OT has put her on hold until resolved. Also noted some intermittent upper right quadrant pain below her ribs. Advised that Dr Nicki Reaper is out of the office until mid next week and intermittently over the next couple of weeks. Advised that given her symptoms, she will need to be seen more acutely to confirm nothing more acute going on and then we can work on moving up her October appt if desires. Patient agreed and stated that she will be out of town 9/2-9/9 so she cannot be seen that week. She will follow up after evaluation.

## 2020-09-08 NOTE — Telephone Encounter (Signed)
Please confirm that she was evaluated and doing ok.

## 2020-09-10 NOTE — Telephone Encounter (Signed)
LMTCB

## 2020-09-12 NOTE — Telephone Encounter (Signed)
Spoke with patient. She was not evaluated. She says that symptoms have been persistent not worsening since she called so she decided not to go. Patients husband had appt on 8/24. She took his appt and his appt has been moved to the following week per their request since his was a routine cpe.

## 2020-09-19 ENCOUNTER — Ambulatory Visit (INDEPENDENT_AMBULATORY_CARE_PROVIDER_SITE_OTHER): Payer: 59 | Admitting: Internal Medicine

## 2020-09-19 ENCOUNTER — Other Ambulatory Visit: Payer: Self-pay

## 2020-09-19 ENCOUNTER — Encounter: Payer: Self-pay | Admitting: Internal Medicine

## 2020-09-19 ENCOUNTER — Ambulatory Visit (INDEPENDENT_AMBULATORY_CARE_PROVIDER_SITE_OTHER): Payer: 59

## 2020-09-19 VITALS — BP 128/64 | HR 61 | Temp 97.6°F | Ht 66.6 in | Wt 165.6 lb

## 2020-09-19 DIAGNOSIS — F331 Major depressive disorder, recurrent, moderate: Secondary | ICD-10-CM

## 2020-09-19 DIAGNOSIS — R739 Hyperglycemia, unspecified: Secondary | ICD-10-CM

## 2020-09-19 DIAGNOSIS — R69 Illness, unspecified: Secondary | ICD-10-CM | POA: Diagnosis not present

## 2020-09-19 DIAGNOSIS — F439 Reaction to severe stress, unspecified: Secondary | ICD-10-CM

## 2020-09-19 DIAGNOSIS — E0590 Thyrotoxicosis, unspecified without thyrotoxic crisis or storm: Secondary | ICD-10-CM | POA: Diagnosis not present

## 2020-09-19 DIAGNOSIS — R0781 Pleurodynia: Secondary | ICD-10-CM

## 2020-09-19 DIAGNOSIS — Z8616 Personal history of COVID-19: Secondary | ICD-10-CM

## 2020-09-19 DIAGNOSIS — M2550 Pain in unspecified joint: Secondary | ICD-10-CM | POA: Diagnosis not present

## 2020-09-19 DIAGNOSIS — E7800 Pure hypercholesterolemia, unspecified: Secondary | ICD-10-CM | POA: Diagnosis not present

## 2020-09-19 LAB — BASIC METABOLIC PANEL
BUN: 20 mg/dL (ref 6–23)
CO2: 29 mEq/L (ref 19–32)
Calcium: 9.6 mg/dL (ref 8.4–10.5)
Chloride: 104 mEq/L (ref 96–112)
Creatinine, Ser: 0.88 mg/dL (ref 0.40–1.20)
GFR: 70.31 mL/min (ref >60.00)
Glucose, Bld: 64 mg/dL — ABNORMAL LOW (ref 70–99)
Potassium: 4.4 mEq/L (ref 3.5–5.1)
Sodium: 138 mEq/L (ref 135–145)

## 2020-09-19 LAB — CBC WITH DIFFERENTIAL/PLATELET
Basophils Absolute: 0 10*3/uL (ref 0.0–0.1)
Basophils Relative: 0.5 % (ref 0.0–3.0)
Eosinophils Absolute: 0.1 10*3/uL (ref 0.0–0.7)
Eosinophils Relative: 1 % (ref 0.0–5.0)
HCT: 40.2 % (ref 36.0–46.0)
Hemoglobin: 13 g/dL (ref 12.0–15.0)
Lymphocytes Relative: 28.1 % (ref 12.0–46.0)
Lymphs Abs: 1.7 10*3/uL (ref 0.7–4.0)
MCHC: 32.3 g/dL (ref 30.0–36.0)
MCV: 98.2 fl (ref 78.0–100.0)
Monocytes Absolute: 0.6 10*3/uL (ref 0.1–1.0)
Monocytes Relative: 8.9 % (ref 3.0–12.0)
Neutro Abs: 3.8 10*3/uL (ref 1.4–7.7)
Neutrophils Relative %: 61.5 % (ref 43.0–77.0)
Platelets: 225 10*3/uL (ref 150.0–400.0)
RBC: 4.1 Mil/uL (ref 3.87–5.11)
RDW: 13.5 % (ref 11.5–15.5)
WBC: 6.2 10*3/uL (ref 4.0–10.5)

## 2020-09-19 LAB — C-REACTIVE PROTEIN: CRP: <1 mg/dL (ref 0.5–20.0)

## 2020-09-19 LAB — HEPATIC FUNCTION PANEL
ALT: 14 U/L (ref 0–35)
AST: 18 U/L (ref 0–37)
Albumin: 4.3 g/dL (ref 3.5–5.2)
Alkaline Phosphatase: 109 U/L (ref 39–117)
Bilirubin, Direct: 0.1 mg/dL (ref 0.0–0.3)
Total Bilirubin: 0.4 mg/dL (ref 0.2–1.2)
Total Protein: 6.5 g/dL (ref 6.0–8.3)

## 2020-09-19 LAB — SEDIMENTATION RATE: Sed Rate: 13 mm/hr (ref 0–30)

## 2020-09-19 NOTE — Progress Notes (Addendum)
Patient ID: Amber Richardson, female   DOB: Oct 19, 1955, 65 y.o.   MRN: [id number]   Subjective:    Patient ID: Amber Richardson, female    DOB: 29-Dec-1955, 65 y.o.   MRN: [id number]  This visit occurred during the SARS-CoV-2 public health emergency.  Safety protocols were in place, including screening questions prior to the visit, additional usage of staff PPE, and extensive cleaning of exam room while observing appropriate contact time as indicated for disinfecting solutions.   Patient here for work in appt. Marland Kitchen   HPI Work in appt for increased aching and joint pain.  Has had persistent pain and aching.  Has seen PT and OT.  OT has put her on hold until can get above issues sorted through.  Reports swelling - hands and fingers.  Aching - hands, elbows, knees and shoulders.  She also reports pain - RUQ/right lower anterior rib.  Present for three weeks.  Constant pain, but not as bad at times.  No chest pain.  No sob reported.  No headache or fever.  No rash.  Eating.  No nausea or vomiting reported.     Past Medical History:  Diagnosis Date   Allergies    Basal cell carcinoma    Fatigue 08/30/2014   Generalized anxiety disorder 03/21/2011   GERD (gastroesophageal reflux disease)    History of COVID-19 02/22/2019   HLD (hyperlipidemia)    HSV infection 09/20/2014   Hypercholesterolemia 02/08/2015   Hyperglycemia 08/28/2019   Hyperthyroidism    Major depressive disorder    Osteoporosis 05/13/2015   Pyelonephritis 1985   Scalp, arm, and leg tingling 08/28/2019   Tendinosis 09/12/2013   Rotator cuff tendinosis. Mild capsulitis.   Tremor 08/28/2019   Subtle to mild, upper extremities, left worse than right   Past Surgical History:  Procedure Laterality Date   DILATION AND CURETTAGE OF UTERUS     Miscarriage     x1   TONSILLECTOMY  1963   Family History  Problem Relation Age of Onset   Hypertension Mother    Hypercholesterolemia Mother    Heart disease Father    Hyperlipidemia  Father    Hypercholesterolemia Father    Stroke Father    Dementia Father        Vascular dementia related to stroke history   Social History   Socioeconomic History   Marital status: Married    Spouse name: Not on file   Number of children: 0   Years of education: 16   Highest education level: Bachelor's degree (e.g., BA, AB, BS)  Occupational History   Not on file  Tobacco Use   Smoking status: Never   Smokeless tobacco: Never  Vaping Use   Vaping Use: Never used  Substance and Sexual Activity   Alcohol use: No    Alcohol/week: 0.0 standard drinks   Drug use: No   Sexual activity: Not on file  Other Topics Concern   Not on file  Social History Narrative   Right handed   Drinks one cup coffee am and one pepsi   Social Determinants of Health   Financial Resource Strain: Not on file  Food Insecurity: Not on file  Transportation Needs: Not on file  Physical Activity: Not on file  Stress: Not on file  Social Connections: Not on file    Review of Systems  Constitutional:  Negative for appetite change and fever.  HENT:  Negative for congestion and sinus pressure.   Respiratory:  Negative for cough, chest  tightness and shortness of breath.   Cardiovascular:  Negative for chest pain, palpitations and leg swelling.  Gastrointestinal:  Negative for abdominal pain, diarrhea, nausea and vomiting.  Genitourinary:  Negative for difficulty urinating and dysuria.  Musculoskeletal:        Joint stiffness and aching/swelling as outlined.  No redness.    Skin:  Negative for color change and rash.  Neurological:  Negative for dizziness, light-headedness and headaches.  Psychiatric/Behavioral:  Negative for agitation and dysphoric mood.       Objective:     BP 128/64   Pulse 61   Temp 97.6 F (36.4 C)   Ht 5' 6.6" (1.692 m)   Wt 165 lb 9.6 oz (75.1 kg)   LMP 01/13/2008   SpO2 96%   BMI 26.25 kg/m  Wt Readings from Last 3 Encounters:  09/19/20 165 lb 9.6 oz (75.1 kg)   06/13/20 165 lb (74.8 kg)  03/13/20 169 lb (76.7 kg)    Physical Exam Vitals reviewed.  Constitutional:      General: She is not in acute distress.    Appearance: Normal appearance.  HENT:     Head: Normocephalic and atraumatic.     Right Ear: External ear normal.     Left Ear: External ear normal.  Eyes:     General: No scleral icterus.       Right eye: No discharge.        Left eye: No discharge.     Conjunctiva/sclera: Conjunctivae normal.  Neck:     Thyroid: No thyromegaly.  Cardiovascular:     Rate and Rhythm: Normal rate and regular rhythm.  Pulmonary:     Effort: No respiratory distress.     Breath sounds: Normal breath sounds. No wheezing.     Comments: No pain with deep breathing.  Abdominal:     General: Bowel sounds are normal.     Palpations: Abdomen is soft.     Tenderness: There is no abdominal tenderness.     Comments: No abdominal pain to palpation.  Specifically no RUQ pain to palpation.   Musculoskeletal:     Cervical back: Neck supple. No tenderness.     Comments: Slow movements.  Limited rom - arms - lifting above 90 degrees.  Pain to palpation - right lower anterior rib.  No pain with deep breathing.   Lymphadenopathy:     Cervical: No cervical adenopathy.  Skin:    Findings: No erythema or rash.  Neurological:     Mental Status: She is alert.  Psychiatric:        Mood and Affect: Mood normal.        Behavior: Behavior normal.    Outpatient Encounter Medications as of 09/19/2020  Medication Sig   methylPREDNISolone (MEDROL DOSEPAK) 4 MG TBPK tablet Medrol dosepack 6 day taper.  Take as directed.   albuterol (VENTOLIN HFA) 108 (90 Base) MCG/ACT inhaler TAKE 2 PUFFS BY MOUTH EVERY 6 HOURS AS NEEDED FOR WHEEZE OR SHORTNESS OF BREATH   ALPRAZolam (XANAX) 0.5 MG tablet Take by mouth.   Calcium Citrate-Vitamin D (CITRACAL + D PO) Take by mouth. Take one in the morning & 2 in the afternoon   DULoxetine (CYMBALTA) 20 MG capsule Take 20 mg by mouth  daily.   DULoxetine (CYMBALTA) 60 MG capsule Take 60 mg by mouth daily.   fluticasone (FLONASE) 50 MCG/ACT nasal spray SPRAY 2 SPRAYS INTO EACH NOSTRIL EVERY DAY   ibuprofen (ADVIL,MOTRIN) 600 MG tablet Take 600 mg  by mouth as needed for pain.   Multiple Vitamin (MULTIVITAMIN) tablet Take 1 tablet by mouth daily.   traZODone (DESYREL) 50 MG tablet 50 mg. Takes 1/2 tab po prn   valACYclovir (VALTREX) 500 MG tablet Take one to two tablets by mouth daily   vitamin B-12 (CYANOCOBALAMIN) 1000 MCG tablet Take 1 tablet (1,000 mcg total) by mouth daily.   [DISCONTINUED] doxycycline (VIBRA-TABS) 100 MG tablet Take 1 tablet (100 mg total) by mouth 2 (two) times daily.   [DISCONTINUED] levothyroxine (SYNTHROID) 75 MCG tablet Take 1 tablet (75 mcg total) by mouth daily before breakfast.   No facility-administered encounter medications on file as of 09/19/2020.     Lab Results  Component Value Date   WBC 6.2 09/19/2020   HGB 13.0 09/19/2020   HCT 40.2 09/19/2020   PLT 225.0 09/19/2020   GLUCOSE 64 (L) 09/19/2020   CHOL 277 (H) 06/11/2020   TRIG 112.0 06/11/2020   HDL 78.90 06/11/2020   LDLDIRECT 165.6 03/23/2012   LDLCALC 176 (H) 06/11/2020   ALT 14 09/19/2020   AST 18 09/19/2020   NA 138 09/19/2020   K 4.4 09/19/2020   CL 104 09/19/2020   CREATININE 0.88 09/19/2020   BUN 20 09/19/2020   CO2 29 09/19/2020   TSH 3.96 06/11/2020   HGBA1C 5.8 06/11/2020    MR Brain W Wo Contrast  Result Date: 06/20/2020 CLINICAL DATA:  Dizziness EXAM: MRI HEAD WITHOUT AND WITH CONTRAST TECHNIQUE: Multiplanar, multiecho pulse sequences of the brain and surrounding structures were obtained without and with intravenous contrast. CONTRAST:  7.47m GADAVIST GADOBUTROL 1 MMOL/ML IV SOLN COMPARISON:  MRI head 10/30/2019 FINDINGS: Brain: No acute infarction, hemorrhage, hydrocephalus, extra-axial collection or mass lesion. Normal white matter. Normal enhancement postcontrast administration. Vascular: Normal arterial  flow voids. Skull and upper cervical spine: Negative Sinuses/Orbits: Negative Other: None IMPRESSION: Negative MRI head with contrast. Electronically Signed   By: CFranchot GalloM.D.   On: 06/20/2020 16:25       Assessment & Plan:   Problem List Items Addressed This Visit     History of COVID-19    Since covid has had multiple residual symptoms/problems.  Initially had persistent sob and DOE.  Had extensive evaluation and w/up by pulmonary.  Breathing has been better overall.  Had described persistent problems with memory, tremors, paresthesias and trouble concentrating and focusing.  MRI normal.  NCS and EMG normal.  Saw neurology.  No clear neurological etiology found.  She is seeing psychiatry - treating anxiety and depression.  Now with increased joint stiffness and pain.  Check ESR and CRP as outlined.  No redness or rash.  Recent RMSF and Lyme - negative.  Medrol dosepack as directed.  Discussed referral to rheumatology.        Relevant Orders   DG Chest 2 View (Completed)   Hypercholesterolemia    Low cholesterol diet and exercise as tolerated.  Follow.       Hyperglycemia    Low carb diet and exercise.  Follow met b and a1c.       Hyperthyroidism    Off tapazole.  On synthroid.  Follow tsh.        Joint pain    Persistent joint pain and stiffness.  Limited rom as outlined.  Limiting activity.  Recent labs unrevealing.  She had requested lyme and RMSF to be checked - negative.  No rash.  Check ESR and CRP.  Given increased pain and stiffness, will treat with medrol dosepack -  to assess for response.  Also discussed rheumatology referral.        Relevant Orders   Sedimentation rate (Completed)   Hepatic function panel (Completed)   CBC with Differential/Platelet (Completed)   Basic metabolic panel (Completed)   C-reactive protein (Completed)   Ambulatory referral to Rheumatology   Moderate episode of recurrent major depressive disorder ([place])    Followed by psychiatry.        Rib pain on right side - Primary    Had described RUQ pain as outlined.  Appears to be more localized over lower right rib.  Check xray.  Further w/up pending results.        Relevant Orders   DG Chest 2 View (Completed)   Stress    Continue f/u with psychiatry.         The entirety of the information documented in the History of Present Illness, Review of Systems and Physical Exam were personally obtained by me. Portions of this information were initially documented by the CMA and reviewed by me for thoroughness and accuracy.    Einar Pheasant, MD

## 2020-09-24 MED ORDER — METHYLPREDNISOLONE 4 MG PO TBPK: ORAL_TABLET | ORAL | 0 refills | Status: DC

## 2020-09-27 ENCOUNTER — Other Ambulatory Visit: Payer: Self-pay | Admitting: Internal Medicine

## 2020-09-28 ENCOUNTER — Encounter: Payer: Self-pay | Admitting: Internal Medicine

## 2020-09-28 DIAGNOSIS — M2550 Pain in unspecified joint: Secondary | ICD-10-CM

## 2020-09-30 ENCOUNTER — Encounter: Payer: Self-pay | Admitting: Internal Medicine

## 2020-09-30 DIAGNOSIS — R0781 Pleurodynia: Secondary | ICD-10-CM | POA: Insufficient documentation

## 2020-09-30 DIAGNOSIS — R0789 Other chest pain: Secondary | ICD-10-CM | POA: Insufficient documentation

## 2020-09-30 NOTE — Assessment & Plan Note (Signed)
Off tapazole.  On synthroid.  Follow tsh.  

## 2020-09-30 NOTE — Assessment & Plan Note (Signed)
Low carb diet and exercise.  Follow met b and a1c.  

## 2020-09-30 NOTE — Assessment & Plan Note (Signed)
Persistent joint pain and stiffness.  Limited rom as outlined.  Limiting activity.  Recent labs unrevealing.  She had requested lyme and RMSF to be checked - negative.  No rash.  Check ESR and CRP.  Given increased pain and stiffness, will treat with medrol dosepack - to assess for response.  Also discussed rheumatology referral.

## 2020-09-30 NOTE — Assessment & Plan Note (Signed)
Continue f/u with psychiatry.

## 2020-09-30 NOTE — Assessment & Plan Note (Signed)
Low cholesterol diet and exercise as tolerated.  Follow.  

## 2020-09-30 NOTE — Assessment & Plan Note (Signed)
Had described RUQ pain as outlined.  Appears to be more localized over lower right rib.  Check xray.  Further w/up pending results.

## 2020-09-30 NOTE — Assessment & Plan Note (Signed)
Followed by psychiatry 

## 2020-09-30 NOTE — Assessment & Plan Note (Signed)
Since covid has had multiple residual symptoms/problems.  Initially had persistent sob and DOE.  Had extensive evaluation and w/up by pulmonary.  Breathing has been better overall.  Had described persistent problems with memory, tremors, paresthesias and trouble concentrating and focusing.  MRI normal.  NCS and EMG normal.  Saw neurology.  No clear neurological etiology found.  She is seeing psychiatry - treating anxiety and depression.  Now with increased joint stiffness and pain.  Check ESR and CRP as outlined.  No redness or rash.  Recent RMSF and Lyme - negative.  Medrol dosepack as directed.  Discussed referral to rheumatology.   

## 2020-10-02 NOTE — Telephone Encounter (Signed)
Patient called in stating that she would like a referral sent to Dr.Wallace Kernodle's office,they have sooner availability with openings for late Septiember.

## 2020-10-02 NOTE — Telephone Encounter (Signed)
Please notify Rasheedah that Amber Richardson is requesting referral to Rheumatology at [facility] instead of the referral to [facility] rheumatology (already in the system).  Also, I am ok with referral to covid clinic, but need to know what department is affiliated with.  I do not know how to place order.

## 2020-10-03 ENCOUNTER — Telehealth: Payer: Self-pay

## 2020-10-03 ENCOUNTER — Other Ambulatory Visit: Payer: Self-pay | Admitting: Internal Medicine

## 2020-10-03 DIAGNOSIS — R1084 Generalized abdominal pain: Secondary | ICD-10-CM | POA: Diagnosis not present

## 2020-10-03 DIAGNOSIS — R1011 Right upper quadrant pain: Secondary | ICD-10-CM

## 2020-10-03 DIAGNOSIS — R109 Unspecified abdominal pain: Secondary | ICD-10-CM

## 2020-10-03 NOTE — Telephone Encounter (Signed)
Pt went to [facility] for right ab pain and she states that they suggest an abdominal ultrasound. They told her that her primary care provider has to order that. Please call back to advise

## 2020-10-03 NOTE — Telephone Encounter (Signed)
I have ordered an abdominal ultrasound.  Someone should be contacting her with an appt date and time.

## 2020-10-03 NOTE — Progress Notes (Signed)
Order placed for abdominal ultrasound.   

## 2020-10-03 NOTE — Telephone Encounter (Signed)
Notes are in care everywhere from urgent care visit. Please review and determine if you would like to order ultrasound.

## 2020-10-05 NOTE — Telephone Encounter (Signed)
Order placed for rheumatology referral.   Thank you

## 2020-10-08 ENCOUNTER — Other Ambulatory Visit: Payer: Self-pay

## 2020-10-08 ENCOUNTER — Ambulatory Visit
Admission: RE | Admit: 2020-10-08 | Discharge: 2020-10-08 | Disposition: A | Discharge status: Home / Self Care | Payer: 59 | Source: Ambulatory Visit | Attending: Internal Medicine | Admitting: Internal Medicine

## 2020-10-08 DIAGNOSIS — R109 Unspecified abdominal pain: Secondary | ICD-10-CM | POA: Diagnosis not present

## 2020-10-08 DIAGNOSIS — R1011 Right upper quadrant pain: Secondary | ICD-10-CM

## 2020-11-01 DIAGNOSIS — M25562 Pain in left knee: Secondary | ICD-10-CM | POA: Diagnosis not present

## 2020-11-01 DIAGNOSIS — M25561 Pain in right knee: Secondary | ICD-10-CM | POA: Diagnosis not present

## 2020-11-01 DIAGNOSIS — M791 Myalgia, unspecified site: Secondary | ICD-10-CM | POA: Diagnosis not present

## 2020-11-01 DIAGNOSIS — M79641 Pain in right hand: Secondary | ICD-10-CM | POA: Diagnosis not present

## 2020-11-01 DIAGNOSIS — M79642 Pain in left hand: Secondary | ICD-10-CM | POA: Diagnosis not present

## 2020-11-01 DIAGNOSIS — M1990 Unspecified osteoarthritis, unspecified site: Secondary | ICD-10-CM | POA: Diagnosis not present

## 2020-11-07 ENCOUNTER — Ambulatory Visit (INDEPENDENT_AMBULATORY_CARE_PROVIDER_SITE_OTHER): Payer: 59 | Admitting: Internal Medicine

## 2020-11-07 ENCOUNTER — Other Ambulatory Visit: Payer: Self-pay

## 2020-11-07 DIAGNOSIS — R0781 Pleurodynia: Secondary | ICD-10-CM | POA: Diagnosis not present

## 2020-11-07 DIAGNOSIS — R739 Hyperglycemia, unspecified: Secondary | ICD-10-CM

## 2020-11-07 DIAGNOSIS — R251 Tremor, unspecified: Secondary | ICD-10-CM

## 2020-11-07 DIAGNOSIS — F439 Reaction to severe stress, unspecified: Secondary | ICD-10-CM

## 2020-11-07 DIAGNOSIS — E0590 Thyrotoxicosis, unspecified without thyrotoxic crisis or storm: Secondary | ICD-10-CM | POA: Diagnosis not present

## 2020-11-07 DIAGNOSIS — Z8616 Personal history of COVID-19: Secondary | ICD-10-CM | POA: Diagnosis not present

## 2020-11-07 DIAGNOSIS — E7800 Pure hypercholesterolemia, unspecified: Secondary | ICD-10-CM

## 2020-11-07 DIAGNOSIS — M2550 Pain in unspecified joint: Secondary | ICD-10-CM

## 2020-11-07 DIAGNOSIS — F331 Major depressive disorder, recurrent, moderate: Secondary | ICD-10-CM

## 2020-11-07 DIAGNOSIS — R69 Illness, unspecified: Secondary | ICD-10-CM | POA: Diagnosis not present

## 2020-11-07 NOTE — Progress Notes (Signed)
Patient ID: Amber Richardson, female   DOB: 1955-04-13, 65 y.o.   MRN: [id number]   Subjective:    Patient ID: Amber Richardson, female    DOB: 27-Feb-1955, 65 y.o.   MRN: [id number]  This visit occurred during the SARS-CoV-2 public health emergency.  Safety protocols were in place, including screening questions prior to the visit, additional usage of staff PPE, and extensive cleaning of exam room while observing appropriate contact time as indicated for disinfecting solutions.   Patient here for a scheduled follow up.   Chief Complaint  Patient presents with   Joint Pain   .   HPI Still having joint pain.  Joints involved - knees, hands, neck, shoulders and hands.  Previous xrays - degenerative changes.  Previous bone scan - uptake in multiple joints - thought to be degenerative.  Had PT and OT - did not tolerate.  Previous ESR and CRP negative.  Seeing psychiatry - on cymbalta.  Saw Dr Jefm Bryant (rheumatology) - prescribed meloxicam.  Third day taking.  May help some with overall body aching, but is still having increased joint pain - stating hard for her to pick up things, grasp, etc.  Also any weight (even small amount of weight - like her phone) aggravates.  Previous steroids did not help.  Still with right side pain - appears to be more localized over lower anterior ribs.  Previous ultrasound unrevealing.  Breathing overall stable.  No nausea or vomiting reported.  No bowel change reported.     Past Medical History:  Diagnosis Date   Allergies    Basal cell carcinoma    Fatigue 08/30/2014   Generalized anxiety disorder 03/21/2011   GERD (gastroesophageal reflux disease)    History of COVID-19 02/22/2019   HLD (hyperlipidemia)    HSV infection 09/20/2014   Hypercholesterolemia 02/08/2015   Hyperglycemia 08/28/2019   Hyperthyroidism    Major depressive disorder    Osteoporosis 05/13/2015   Pyelonephritis 1985   Scalp, arm, and leg tingling 08/28/2019   Tendinosis 09/12/2013    Rotator cuff tendinosis. Mild capsulitis.   Tremor 08/28/2019   Subtle to mild, upper extremities, left worse than right   Past Surgical History:  Procedure Laterality Date   DILATION AND CURETTAGE OF UTERUS     Miscarriage     x1   TONSILLECTOMY  1963   Family History  Problem Relation Age of Onset   Hypertension Mother    Hypercholesterolemia Mother    Heart disease Father    Hyperlipidemia Father    Hypercholesterolemia Father    Stroke Father    Dementia Father        Vascular dementia related to stroke history   Social History   Socioeconomic History   Marital status: Married    Spouse name: Not on file   Number of children: 0   Years of education: 16   Highest education level: Bachelor's degree (e.g., BA, AB, BS)  Occupational History   Not on file  Tobacco Use   Smoking status: Never   Smokeless tobacco: Never  Vaping Use   Vaping Use: Never used  Substance and Sexual Activity   Alcohol use: No    Alcohol/week: 0.0 standard drinks   Drug use: No   Sexual activity: Not on file  Other Topics Concern   Not on file  Social History Narrative   Right handed   Drinks one cup coffee am and one pepsi   Social Determinants of Health   Financial Resource Strain:  Not on file  Food Insecurity: Not on file  Transportation Needs: Not on file  Physical Activity: Not on file  Stress: Not on file  Social Connections: Not on file     Review of Systems  Constitutional:  Negative for appetite change and unexpected weight change.  HENT:  Negative for congestion and sinus pressure.   Respiratory:  Negative for cough and chest tightness.        Breathing stable.    Cardiovascular:  Negative for chest pain, palpitations and leg swelling.  Gastrointestinal:  Negative for abdominal pain, diarrhea, nausea and vomiting.  Genitourinary:  Negative for difficulty urinating and dysuria.  Musculoskeletal:        Joint aches as outlined.    Skin:  Negative for color change  and rash.  Neurological:  Negative for dizziness, light-headedness and headaches.  Psychiatric/Behavioral:  Negative for agitation and dysphoric mood.       Objective:     BP 122/70   Pulse 64   Temp 97.9 F (36.6 C)   Resp 16   Ht '5\' 7"'  (1.702 m)   Wt 168 lb 6.4 oz (76.4 kg)   LMP 01/13/2008   SpO2 99%   BMI 26.38 kg/m  Wt Readings from Last 3 Encounters:  11/07/20 168 lb 6.4 oz (76.4 kg)  09/19/20 165 lb 9.6 oz (75.1 kg)  06/13/20 165 lb (74.8 kg)    Physical Exam Vitals reviewed.  Constitutional:      General: She is not in acute distress.    Appearance: Normal appearance.  HENT:     Head: Normocephalic and atraumatic.     Right Ear: External ear normal.     Left Ear: External ear normal.  Eyes:     General: No scleral icterus.       Right eye: No discharge.        Left eye: No discharge.     Conjunctiva/sclera: Conjunctivae normal.  Neck:     Thyroid: No thyromegaly.  Cardiovascular:     Rate and Rhythm: Normal rate and regular rhythm.  Pulmonary:     Effort: No respiratory distress.     Breath sounds: Normal breath sounds. No wheezing.  Abdominal:     General: Bowel sounds are normal.     Palpations: Abdomen is soft.     Tenderness: There is no abdominal tenderness.  Musculoskeletal:        General: No swelling or tenderness.     Cervical back: Neck supple. No tenderness.  Lymphadenopathy:     Cervical: No cervical adenopathy.  Skin:    Findings: No erythema or rash.  Neurological:     Mental Status: She is alert.  Psychiatric:        Mood and Affect: Mood normal.        Behavior: Behavior normal.     Outpatient Encounter Medications as of 11/07/2020  Medication Sig   albuterol (VENTOLIN HFA) 108 (90 Base) MCG/ACT inhaler TAKE 2 PUFFS BY MOUTH EVERY 6 HOURS AS NEEDED FOR WHEEZE OR SHORTNESS OF BREATH   ALPRAZolam (XANAX) 0.5 MG tablet Take by mouth.   Calcium Citrate-Vitamin D (CITRACAL + D PO) Take by mouth. Take one in the morning & 2 in the  afternoon   DULoxetine (CYMBALTA) 20 MG capsule Take 20 mg by mouth daily.   DULoxetine (CYMBALTA) 60 MG capsule Take 60 mg by mouth daily.   fluticasone (FLONASE) 50 MCG/ACT nasal spray SPRAY 2 SPRAYS INTO EACH NOSTRIL EVERY DAY  levothyroxine (SYNTHROID) 75 MCG tablet TAKE 1 TABLET BY MOUTH DAILY BEFORE BREAKFAST.   meloxicam (MOBIC) 7.5 MG tablet Take 7.5 mg by mouth daily.   Multiple Vitamin (MULTIVITAMIN) tablet Take 1 tablet by mouth daily.   traZODone (DESYREL) 50 MG tablet 50 mg. Takes 1/2 tab po prn   valACYclovir (VALTREX) 500 MG tablet Take one to two tablets by mouth daily   vitamin B-12 (CYANOCOBALAMIN) 1000 MCG tablet Take 1 tablet (1,000 mcg total) by mouth daily.   [DISCONTINUED] ibuprofen (ADVIL,MOTRIN) 600 MG tablet Take 600 mg by mouth as needed for pain.   [DISCONTINUED] methylPREDNISolone (MEDROL DOSEPAK) 4 MG TBPK tablet Medrol dosepack 6 day taper.  Take as directed.   No facility-administered encounter medications on file as of 11/07/2020.     Lab Results  Component Value Date   WBC 6.2 09/19/2020   HGB 13.0 09/19/2020   HCT 40.2 09/19/2020   PLT 225.0 09/19/2020   GLUCOSE 64 (L) 09/19/2020   CHOL 277 (H) 06/11/2020   TRIG 112.0 06/11/2020   HDL 78.90 06/11/2020   LDLDIRECT 165.6 03/23/2012   LDLCALC 176 (H) 06/11/2020   ALT 14 09/19/2020   AST 18 09/19/2020   NA 138 09/19/2020   K 4.4 09/19/2020   CL 104 09/19/2020   CREATININE 0.88 09/19/2020   BUN 20 09/19/2020   CO2 29 09/19/2020   TSH 3.96 06/11/2020   HGBA1C 5.8 06/11/2020    US Abdomen Complete  Result Date: 10/09/2020 CLINICAL DATA:  Right-sided and right upper quadrant abdominal pain for 6 weeks EXAM: ABDOMEN ULTRASOUND COMPLETE COMPARISON:  Abdominal ultrasound 09/06/2014, CT abdomen/pelvis 12/16/2012 FINDINGS: Gallbladder: No gallstones or wall thickening visualized. No sonographic Murphy sign noted by sonographer. Common bile duct: Diameter: 4 mm Liver: No focal lesion identified. Within  normal limits in parenchymal echogenicity. Portal vein is patent on color Doppler imaging with normal direction of blood flow towards the liver. IVC: No abnormality visualized. Pancreas: Visualized portion unremarkable. Spleen: Size and appearance within normal limits. Right Kidney: Length: 10.5 cm. Echogenicity within normal limits. No mass or hydronephrosis visualized. Left Kidney: Length: 10.6 cm. Echogenicity within normal limits. No mass or hydronephrosis visualized. Abdominal aorta: No aneurysm visualized. Other findings: None. IMPRESSION: Normal abdominal ultrasound. Electronically Signed   By: Valetta Mole M.D.   On: 10/09/2020 13:24       Assessment & Plan:   Problem List Items Addressed This Visit     History of COVID-19    Since covid has had multiple residual symptoms/problems.  Initially had persistent sob and DOE.  Had extensive evaluation and w/up by pulmonary.  Breathing has been better overall.  Had described persistent problems with memory, tremors, paresthesias and trouble concentrating and focusing.  MRI normal.  NCS and EMG normal.  Saw neurology.  No clear neurological etiology found.  She is seeing psychiatry - treating anxiety and depression.  Now with increased joint stiffness and pain.   ESR and CRP - ok.  No redness or rash.  Recent RMSF and Lyme - negative.  Medrol dosepack - did not help.  Seeing Dr Jefm Bryant.  Trial of meloxicam currently.  Keep w/u with  Rheumatology.       Hypercholesterolemia    Low cholesterol diet and exercise as tolerated.  Follow.       Hyperglycemia    Low carb diet and exercise.  Follow met b and a1c.       Hyperthyroidism    Off tapazole.  On synthroid.  Follow  tsh.       Joint pain    Persistent joint pain and stiffness.  Per her report, unable to pick up things, grip things as outlined.  Seeing Dr Jefm Bryant.  Bone scan/xray - degenerative changes noted.  Did not respond to steroids.  Currently - trial of meloxicam.  Continue f/u with  Dr Lavonne Chick.        Moderate episode of recurrent major depressive disorder ([place])    Followed by psychiatry.  On cymbalta.        Rib pain on right side    Previous RUQ ultrasound unrevealing.  Appears to be more localized to her lower anterior rib.  Recent bone scan unrevealing.  On meloxicam - just started.  Keep f/u with Dr Lavonne Chick.        Stress    Increased stress related to her ongoing symptoms, etc.  Seeing psychiatry.  On cymbalta.        Tremor    MRI normal.  NCS/EMG - normal.  Saw neurology.  No definitive neurological explanation for her symptoms.  S/p covid.          Einar Pheasant, MD

## 2020-11-08 ENCOUNTER — Telehealth: Payer: Self-pay | Admitting: Internal Medicine

## 2020-11-08 NOTE — Telephone Encounter (Signed)
Mammogram done 05/02/20. Abstracted in chart

## 2020-11-08 NOTE — Telephone Encounter (Signed)
Received notification that she is overdue mammogram.  Need to schedule if not already scheduled.

## 2020-11-11 ENCOUNTER — Encounter: Payer: Self-pay | Admitting: Internal Medicine

## 2020-11-11 DIAGNOSIS — Z0000 Encounter for general adult medical examination without abnormal findings: Secondary | ICD-10-CM | POA: Insufficient documentation

## 2020-11-11 NOTE — Assessment & Plan Note (Signed)
Previous RUQ ultrasound unrevealing.  Appears to be more localized to her lower anterior rib.  Recent bone scan unrevealing.  On meloxicam - just started.  Keep f/u with Dr Lavonne Chick.

## 2020-11-11 NOTE — Assessment & Plan Note (Signed)
Low carb diet and exercise.  Follow met b and a1c.  

## 2020-11-11 NOTE — Assessment & Plan Note (Signed)
Followed by psychiatry.  On cymbalta.

## 2020-11-11 NOTE — Assessment & Plan Note (Signed)
Low cholesterol diet and exercise as tolerated.  Follow.  

## 2020-11-11 NOTE — Assessment & Plan Note (Signed)
Off tapazole.  On synthroid.  Follow tsh.  

## 2020-11-11 NOTE — Assessment & Plan Note (Signed)
Increased stress related to her ongoing symptoms, etc.  Seeing psychiatry.  On cymbalta.

## 2020-11-11 NOTE — Assessment & Plan Note (Signed)
Since covid has had multiple residual symptoms/problems.  Initially had persistent sob and DOE.  Had extensive evaluation and w/up by pulmonary.  Breathing has been better overall.  Had described persistent problems with memory, tremors, paresthesias and trouble concentrating and focusing.  MRI normal.  NCS and EMG normal.  Saw neurology.  No clear neurological etiology found.  She is seeing psychiatry - treating anxiety and depression.  Now with increased joint stiffness and pain.   ESR and CRP - ok.  No redness or rash.  Recent RMSF and Lyme - negative.  Medrol dosepack - did not help.  Seeing Dr Jefm Bryant.  Trial of meloxicam currently.  Keep w/u with  Rheumatology.

## 2020-11-11 NOTE — Assessment & Plan Note (Signed)
MRI normal.  NCS/EMG - normal.  Saw neurology.  No definitive neurological explanation for her symptoms.  S/p covid.

## 2020-11-11 NOTE — Assessment & Plan Note (Signed)
Persistent joint pain and stiffness.  Per her report, unable to pick up things, grip things as outlined.  Seeing Dr Jefm Bryant.  Bone scan/xray - degenerative changes noted.  Did not respond to steroids.  Currently - trial of meloxicam.  Continue f/u with Dr Lavonne Chick.

## 2020-11-13 DIAGNOSIS — F330 Major depressive disorder, recurrent, mild: Secondary | ICD-10-CM | POA: Diagnosis not present

## 2020-11-13 DIAGNOSIS — R69 Illness, unspecified: Secondary | ICD-10-CM | POA: Diagnosis not present

## 2020-11-20 ENCOUNTER — Telehealth: Payer: Self-pay | Admitting: Internal Medicine

## 2020-11-20 NOTE — Telephone Encounter (Signed)
Patient called and would like Larena Glassman to know the form that was given to [place] that is 6+plus pages long,( patient did not know the name of form), She is not talking about the FMLA forms. Those forms are not needed at this time.

## 2020-11-20 NOTE — Telephone Encounter (Signed)
Noted. FMLA paper has been completed.

## 2020-11-27 DIAGNOSIS — M791 Myalgia, unspecified site: Secondary | ICD-10-CM | POA: Diagnosis not present

## 2020-11-27 DIAGNOSIS — M79641 Pain in right hand: Secondary | ICD-10-CM | POA: Diagnosis not present

## 2020-11-27 DIAGNOSIS — M1990 Unspecified osteoarthritis, unspecified site: Secondary | ICD-10-CM | POA: Diagnosis not present

## 2020-11-27 DIAGNOSIS — M79642 Pain in left hand: Secondary | ICD-10-CM | POA: Diagnosis not present

## 2020-12-10 ENCOUNTER — Ambulatory Visit: Payer: 59 | Attending: Rheumatology | Admitting: Occupational Therapy

## 2020-12-10 ENCOUNTER — Encounter: Payer: Self-pay | Admitting: Occupational Therapy

## 2020-12-10 DIAGNOSIS — M25531 Pain in right wrist: Secondary | ICD-10-CM | POA: Insufficient documentation

## 2020-12-10 DIAGNOSIS — M6281 Muscle weakness (generalized): Secondary | ICD-10-CM | POA: Diagnosis not present

## 2020-12-10 DIAGNOSIS — M25532 Pain in left wrist: Secondary | ICD-10-CM | POA: Insufficient documentation

## 2020-12-10 DIAGNOSIS — M79641 Pain in right hand: Secondary | ICD-10-CM | POA: Insufficient documentation

## 2020-12-10 DIAGNOSIS — M79642 Pain in left hand: Secondary | ICD-10-CM | POA: Diagnosis not present

## 2020-12-10 DIAGNOSIS — M25641 Stiffness of right hand, not elsewhere classified: Secondary | ICD-10-CM | POA: Insufficient documentation

## 2020-12-10 NOTE — Therapy (Signed)
[facility] [address], [place], [address] Phone: [phone]   Fax:  [phone]  Occupational Therapy Evaluation  Patient Details  Name: Amber Richardson MRN: [id number] Date of Birth: August 25, 1955 Referring Provider (OT): DR Jefm Bryant   Encounter Date: 12/10/2020   OT End of Session - 12/10/20 1352     Visit Number 1    Number of Visits 5    Date for OT Re-Evaluation 01/21/21    OT Start Time 0950    OT Stop Time 1038    OT Time Calculation (min) 48 min    Activity Tolerance Patient tolerated treatment well    Behavior During Therapy [facility] for tasks assessed/performed             Past Medical History:  Diagnosis Date   Allergies    Basal cell carcinoma    Fatigue 08/30/2014   Generalized anxiety disorder 03/21/2011   GERD (gastroesophageal reflux disease)    History of COVID-19 02/22/2019   HLD (hyperlipidemia)    HSV infection 09/20/2014   Hypercholesterolemia 02/08/2015   Hyperglycemia 08/28/2019   Hyperthyroidism    Major depressive disorder    Osteoporosis 05/13/2015   Pyelonephritis 1985   Scalp, arm, and leg tingling 08/28/2019   Tendinosis 09/12/2013   Rotator cuff tendinosis. Mild capsulitis.   Tremor 08/28/2019   Subtle to mild, upper extremities, left worse than right    Past Surgical History:  Procedure Laterality Date   DILATION AND CURETTAGE OF UTERUS     Miscarriage     x1   TONSILLECTOMY  1963    There were no vitals filed for this visit.   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 And get back with [place]  Hand therapy  Add Neurontin 100 3 times daily for pain syndrome  See back 6 weeks    Patient Stated Goals Want the pain better so I can use my hands better and have more strength to open , grip , carry objects like my plate, Ipad    Currently in Pain? 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, Ipad, and phone      AROM   Right Wrist Extension 70 Degrees    Right Wrist Flexion 85 Degrees    Right Wrist Radial Deviation 15 Degrees   pain   Right Wrist Ulnar Deviation 30 Degrees    Left Wrist Extension 70 Degrees    Left Wrist Flexion 90 Degrees    Left Wrist Radial Deviation 15 Degrees    Left Wrist Ulnar Deviation 30 Degrees      Strength   Right Hand Grip (lbs) 11   16 with [facility]MC neoprene splint   Right Hand Lateral Pinch 5 lbs    Right Hand 3 Point Pinch 3 lbs    Left Hand Grip (lbs) 10    Left Hand Lateral Pinch 4 lbs    Left Hand 3 Point Pinch 4 lbs      Right Hand AROM   R Thumb Radial ABduction/ADduction 0-55 50    R Thumb Palmar ABduction/ADduction 0-45 50    R Thumb  Opposition to Index --   Opposition to 5th - pain to 4th and 5th in thumb   R Index  MCP 0-90 90 Degrees    R Index PIP 0-100 90 Degrees    R Long  MCP 0-90 90 Degrees    R Long PIP 0-100 85 Degrees    R Ring  MCP 0-90 90 Degrees    R Ring PIP 0-100 80 Degrees    R Little  MCP 0-90 90 Degrees    R Little PIP 0-100 65 Degrees      Left Hand AROM   L Thumb Radial ADduction/ABduction 0-55 40    L Thumb Palmar ADduction/ABduction 0-45 45    L Thumb Opposition to Index --   Opposition to 5th - pain with thumb to 4th and 5th   L Index  MCP 0-90 90 Degrees    L Index PIP 0-100 100 Degrees    L Long  MCP 0-90 90 Degrees    L Long PIP 0-100 100 Degrees    L Ring  MCP 0-90 90 Degrees    L Ring PIP 0-100 100 Degrees    L Little  MCP 0-90 90 Degrees    L Little PIP 0-100 100 Degrees                      OT Treatments/Exercises (OP) - 12/10/20 0001       LUE Paraffin   Number Minutes Paraffin 8 Minutes    LUE Paraffin Location Hand;Wrist    Comments decrease pain and stiffness      RUE Fluidotherapy   Number Minutes Fluidotherapy 10 Minutes    RUE Fluidotherapy Location Hand;Wrist    Comments contrast done 2 cycle - derease pain but did not feel as  good as paraffin on L                    OT Education - 12/10/20 1351     Education Details Findings of eval and HEP/ joint protection , splint wearing    Person(s) Educated Patient    Methods Explanation;Demonstration;Tactile cues;Verbal cues;Handout    Comprehension Verbal cues required;Returned demonstration;Verbalized  understanding                 OT Long Term Goals - 12/10/20 1400       OT LONG TERM GOAL #1   Title Pt to show understanding of homeprogram for splints, modalities and joint protection to decrease pain less than 3-4/10 at the worse    Baseline pain in radial hand and wrist 6/10 or more - constant and no knowledge of homeprogram    Time 3    Period Weeks    Status New    Target  Date 12/31/20      OT LONG TERM GOAL #2   Title Pt to show decrease pain to increase R hand digits AROM to WNL compare to L    Baseline R hand pain opposition to 4th and 5th- MC's 90 but PIP 65 to 90 - pain - not able to touch palm    Time 3    Period Weeks    Status New    Target Date 12/31/20      OT LONG TERM GOAL #3   Title Bilateral grip and prehension strength increase to within her range of age to be able to hold plate, turn doorknob and use in ADL's more than 50% with pain less than 3-4/10    Baseline report not using her hands a lot - on phone and Ipad some - prop up - cannot carry or hold plate- cannot do things around the house - husband does most everything - watch tv Grip R 11,l 10 lbs ; Lat grip R 5, L4 lbs; 3 point inch R 3 , L 4 lbs    Time 6    Period Weeks    Status New    Target Date 01/21/21                   Plan - 12/10/20 1353     Clinical Impression Statement Pt refer to OT with increase hand pain since April 22 - OA diagnosis -  pt report pain in all major joints in body - pt pain 6/10 or more in bilateral thumbs and 2nd digits- over radial hand and wrist -  increase edema per pt some days- stiffness in the morning - denies any sensation changes -and did not had any trigger nodules- pt with decrease AROM more in R hand than L , and grip and prehension strength greatly impaired. All limiting her functional use of hands and wrist in ADL's and IADL's. Pt  had less pain and increase pain free AROM in paraffin more than fluidothrapy with contrast. Pt to do contrast if increase swelling and pain, moist heat with stiffness only - keep AROM pain free. Fitted with CMC neoprene splint on R to use during ay - had 5 lbs increase grip with it on . Pt ed and hand out provided on joint protection and AE trng - pt to do homeprogram for 2 wks because of Thanksgiving - follow up    OT Occupational Profile and History Problem Focused Assessment - Including review of records  relating to presenting problem    Occupational performance deficits (Please refer to evaluation for details): ADL's;IADL's;Play;Leisure;Social Participation    Body Structure / Function / Physical Skills ADL;Strength;Decreased knowledge of use of DME;Pain;Edema;UE functional use;IADL;ROM;Flexibility    Rehab Potential Fair    Clinical Decision Making Limited treatment options, no task modification necessary    Comorbidities Affecting Occupational Performance: None    Modification or Assistance  to Complete Evaluation  No modification of tasks or assist necessary to complete eval    OT Frequency Biweekly    OT Duration 6 weeks    OT Treatment/Interventions Self-care/ADL training;Contrast Bath;Paraffin;Energy conservation;Manual Therapy;Patient/family education;Splinting;DME and/or AE instruction;Therapeutic exercise    Consulted and Agree with Plan of Care Patient             Patient will benefit from skilled therapeutic intervention in order to improve the following deficits and impairments:   Body Structure / Function / Physical Skills: ADL, Strength, Decreased knowledge of use of DME, Pain, Edema, UE functional use, IADL, ROM, Flexibility       Visit Diagnosis: Pain in left hand - Plan: Ot plan of care cert/re-cert  Pain in right hand - Plan: Ot plan of care cert/re-cert  Pain in right wrist - Plan: Ot plan of care cert/re-cert  Pain in left wrist - Plan: Ot plan of care cert/re-cert  Stiffness of right hand, not elsewhere classified - Plan: Ot plan of care cert/re-cert  Muscle weakness (generalized) - Plan: Ot plan of care cert/re-cert    Problem List Patient Active Problem List   Diagnosis Date Noted   Healthcare maintenance 11/11/2020   Rib pain on right side 09/30/2020   Joint pain 09/19/2020   Chest congestion 06/17/2020   Joint stiffness 06/17/2020   Change in vision 06/17/2020   Unsteady gait 06/17/2020   Moderate episode of recurrent major depressive disorder  ([place]) 06/17/2020   Muscle weakness 06/13/2020   Stress 11/14/2019   Breast cancer screening 11/14/2019   Major depressive disorder    Disorder of bursae of shoulder region 09/01/2019   Knee pain 09/01/2019   Low back pain 35/00/9381   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   Scalp, arm, and leg tingling 08/28/2019   Tremor 08/28/2019   Hyperglycemia 08/28/2019   History of COVID-19 02/20/2019   Left arm pain 01/09/2019   Neck pain 09/04/2018   Dizziness 12/19/2017   Dyspnea 09/13/2017   Left facial numbness 06/14/2015   Osteoporosis 05/13/2015   Hypercholesterolemia 02/08/2015   Left foot pain 11/04/2014   Atrophic vaginitis 09/20/2014   HSV infection 09/20/2014   Pelvic pain in female 09/20/2014   Fatigue 08/30/2014   Right calf pain 08/30/2014   Chest pain 08/30/2014   Abdominal pain 08/29/2014   Tendinosis 09/12/2013   Basal cell carcinoma 03/17/2012   Hyperthyroidism 03/17/2012   Fibroids 01/19/2012   BV (bacterial vaginosis) 01/05/2012   PMB (postmenopausal bleeding) 01/05/2012   Generalized anxiety disorder 03/21/2011   GERD (gastroesophageal reflux disease) 03/21/2011    Rosalyn Gess, OTR/L,CLT 12/10/2020, 2:06 PM  [facility] [address] [place], [address] Phone: [phone]   Fax:  [phone]  Name: Amber Richardson MRN: [id number] Date of Birth: 05/03/1955

## 2020-12-13 DIAGNOSIS — L821 Other seborrheic keratosis: Secondary | ICD-10-CM | POA: Diagnosis not present

## 2020-12-13 DIAGNOSIS — Z1283 Encounter for screening for malignant neoplasm of skin: Secondary | ICD-10-CM | POA: Diagnosis not present

## 2020-12-13 DIAGNOSIS — L812 Freckles: Secondary | ICD-10-CM | POA: Diagnosis not present

## 2020-12-13 DIAGNOSIS — L111 Transient acantholytic dermatosis [Grover]: Secondary | ICD-10-CM | POA: Diagnosis not present

## 2020-12-13 DIAGNOSIS — D1801 Hemangioma of skin and subcutaneous tissue: Secondary | ICD-10-CM | POA: Diagnosis not present

## 2020-12-13 DIAGNOSIS — Z85828 Personal history of other malignant neoplasm of skin: Secondary | ICD-10-CM | POA: Diagnosis not present

## 2020-12-13 DIAGNOSIS — L988 Other specified disorders of the skin and subcutaneous tissue: Secondary | ICD-10-CM | POA: Diagnosis not present

## 2020-12-13 DIAGNOSIS — L570 Actinic keratosis: Secondary | ICD-10-CM | POA: Diagnosis not present

## 2020-12-13 DIAGNOSIS — D225 Melanocytic nevi of trunk: Secondary | ICD-10-CM | POA: Diagnosis not present

## 2020-12-18 IMAGING — DX CERVICAL SPINE - 2-3 VIEW
4 series · 4 of 4 positions shown · non-contrast
Comparison: None.

CLINICAL DATA: Back and arm pain

EXAM:
CERVICAL SPINE - 2-3 VIEW

[cervical spine ap]
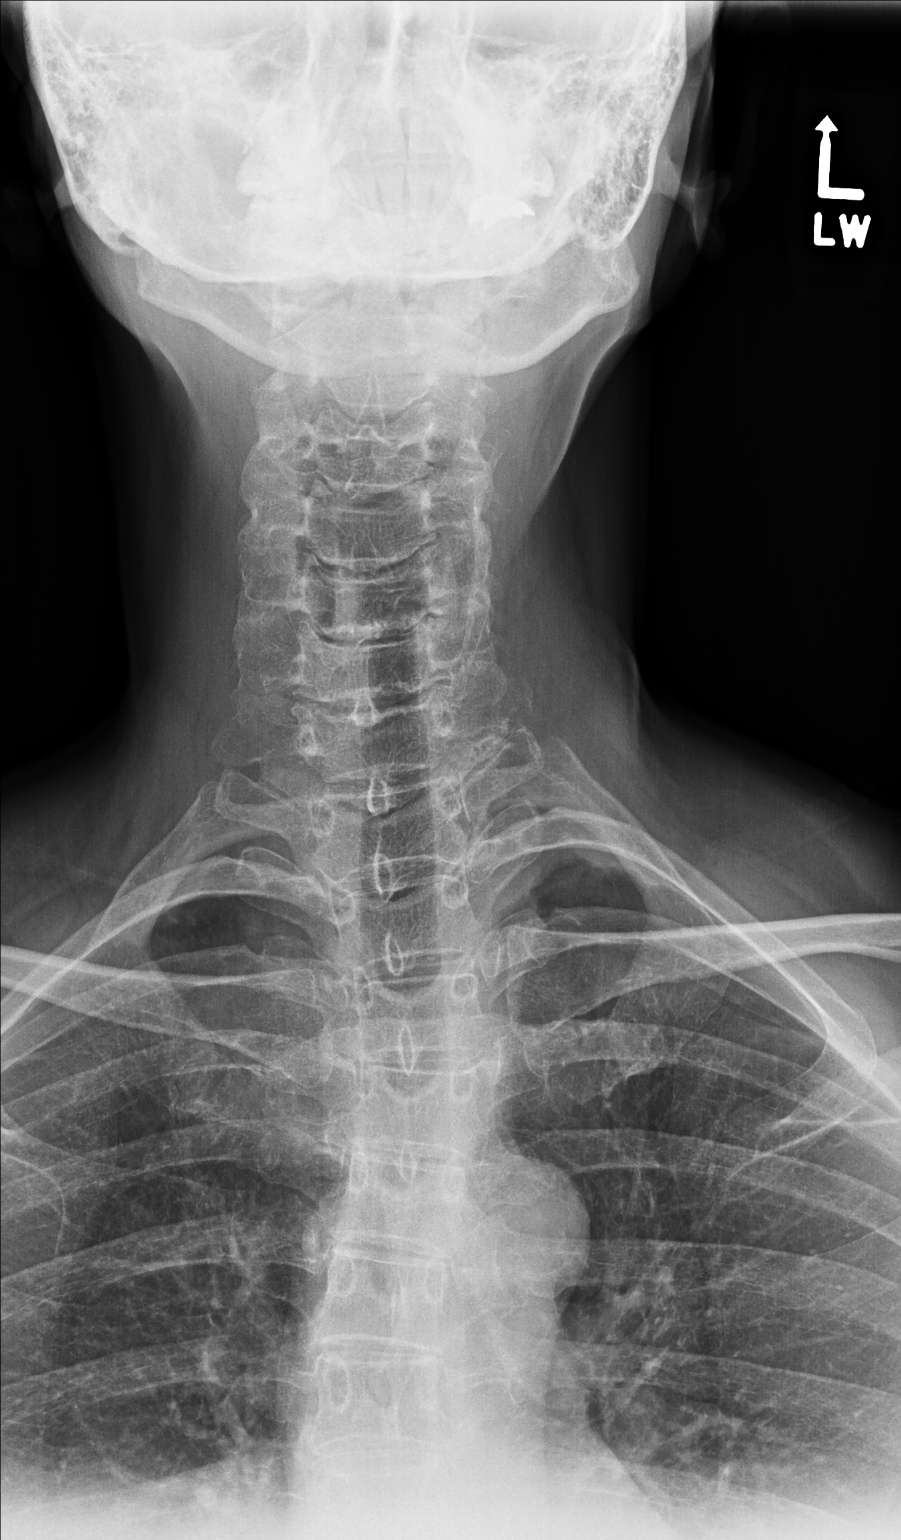

[cervical spine lat]
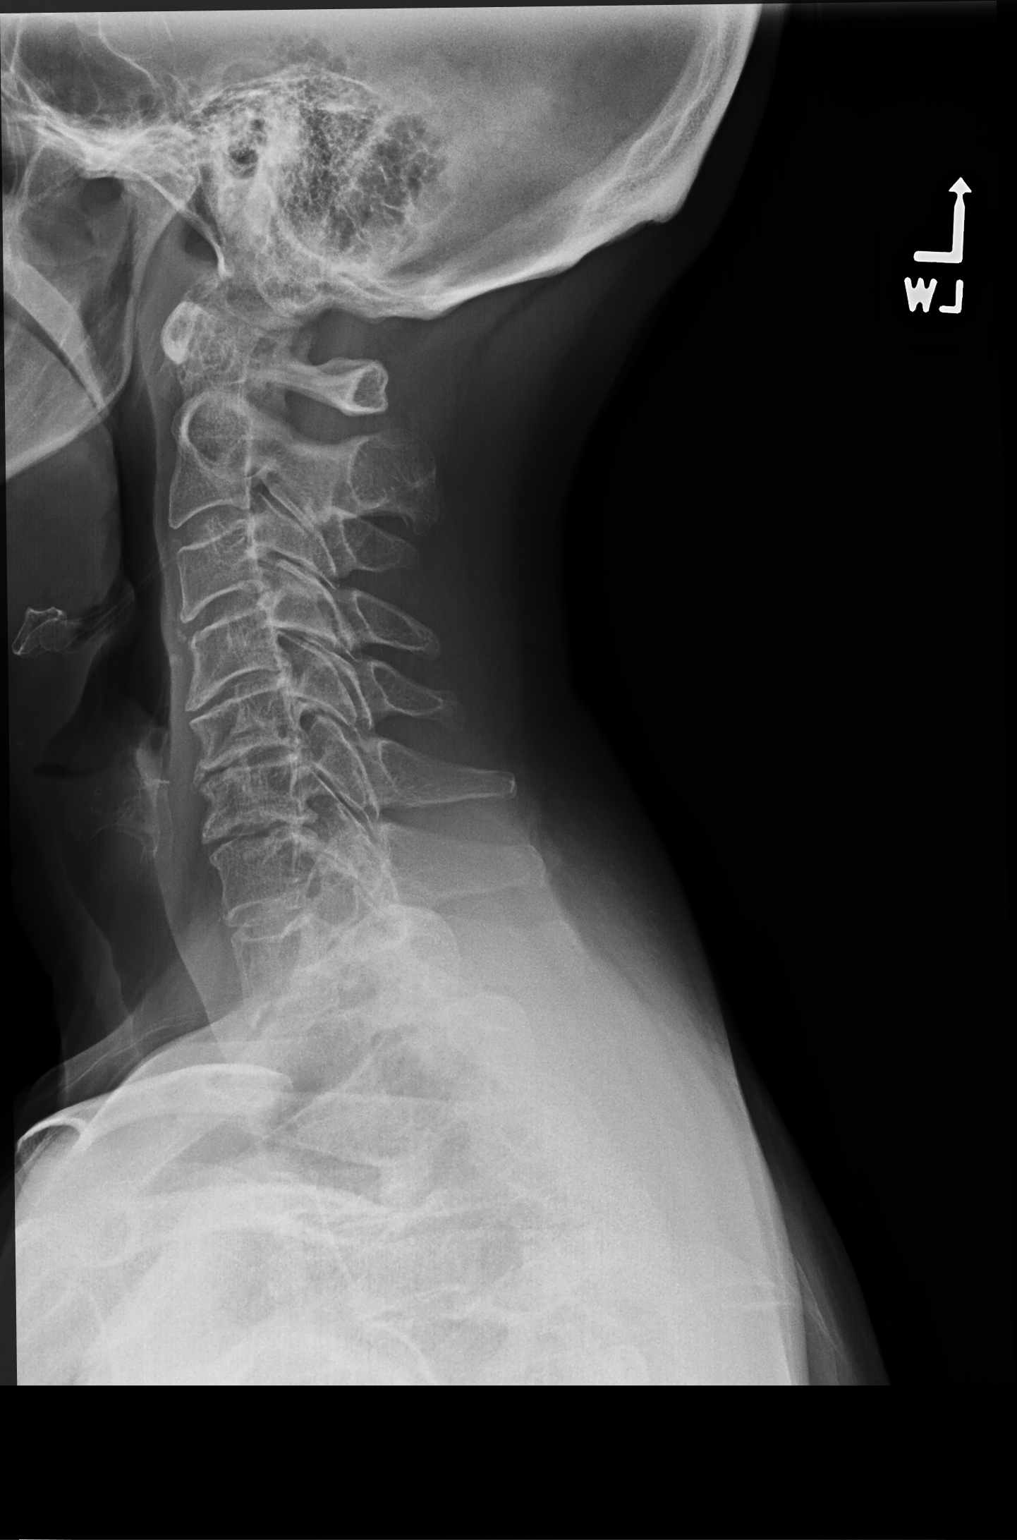

[swimmers lat]
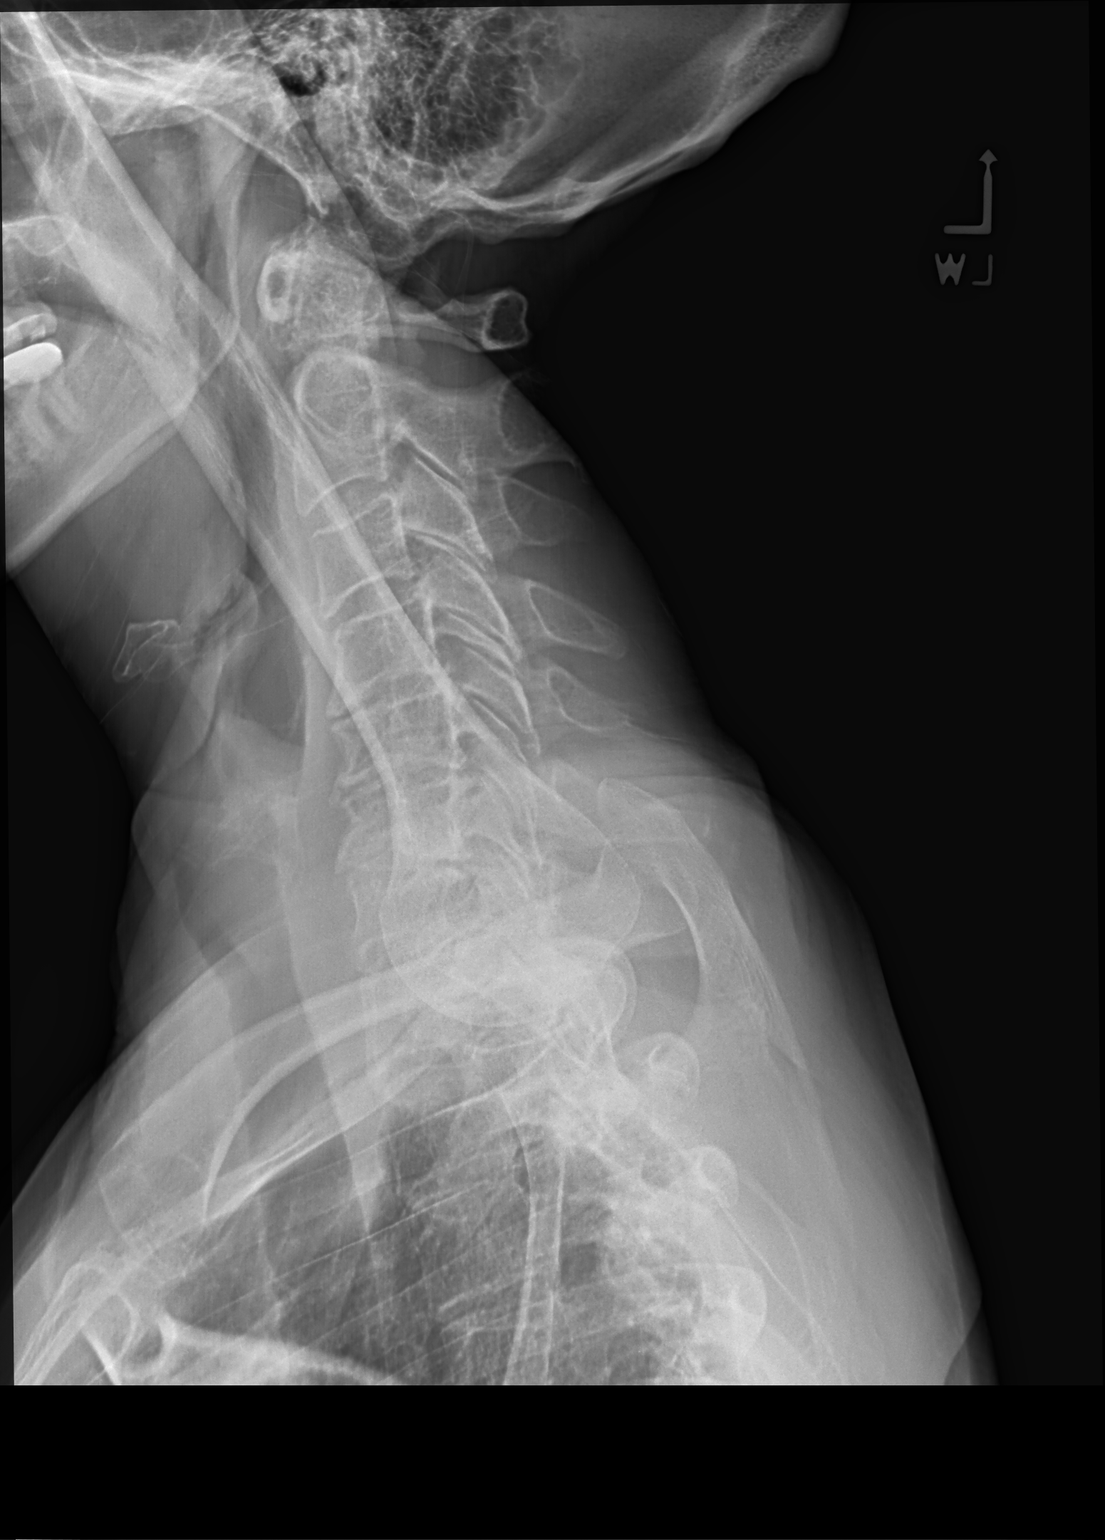

[cervical spine open mouth ap]
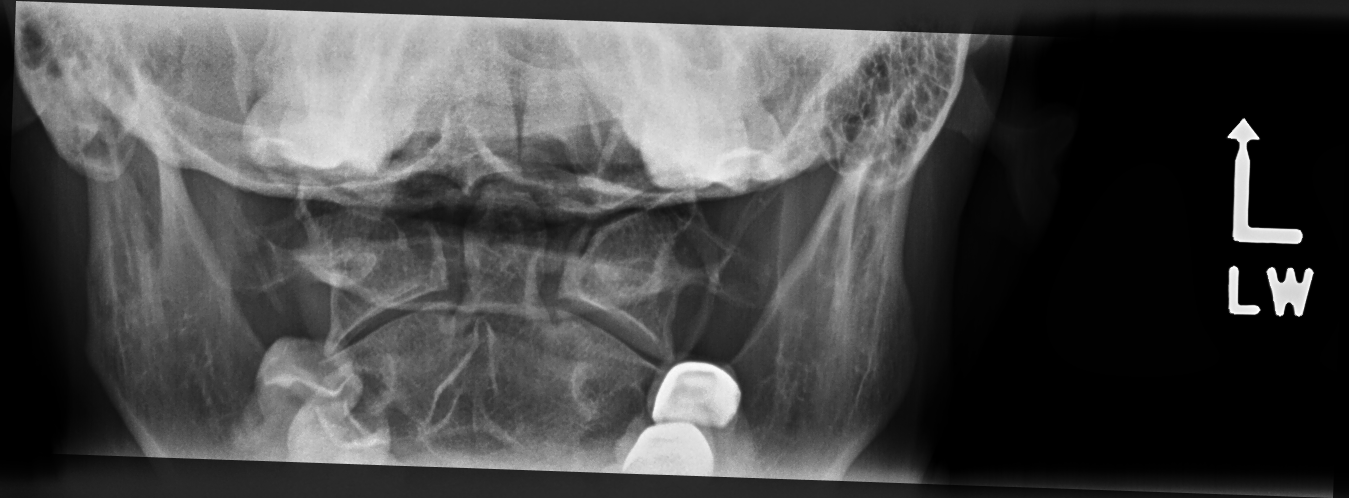

[4 of 4 positions shown; findings below may reference images not displayed]

FINDINGS: No acute fracture or traumatic listhesis is identified. The dens is
intact. The atlantal dental interval is normal. Multilevel cervical
spondylitic changes are present with reversal of the cervical
lordosis centered at C5-6 on a degenerative basis. Uncinate spurring
results in at most mild spinal canal narrowing. The airways patent.
No pre or paravertebral soft tissue abnormality. No acute
abnormality in the upper chest or imaged lung apices.
IMPRESSION: Multilevel cervical spondylitic changes. No acute osseous
abnormality.

## 2020-12-24 ENCOUNTER — Ambulatory Visit: Payer: 59 | Admitting: Occupational Therapy

## 2020-12-27 ENCOUNTER — Ambulatory Visit: Payer: 59 | Attending: Rheumatology | Admitting: Occupational Therapy

## 2020-12-27 DIAGNOSIS — M25532 Pain in left wrist: Secondary | ICD-10-CM | POA: Insufficient documentation

## 2020-12-27 DIAGNOSIS — M6281 Muscle weakness (generalized): Secondary | ICD-10-CM | POA: Insufficient documentation

## 2020-12-27 DIAGNOSIS — M79641 Pain in right hand: Secondary | ICD-10-CM | POA: Insufficient documentation

## 2020-12-27 DIAGNOSIS — M25641 Stiffness of right hand, not elsewhere classified: Secondary | ICD-10-CM | POA: Insufficient documentation

## 2020-12-27 DIAGNOSIS — B353 Tinea pedis: Secondary | ICD-10-CM | POA: Diagnosis not present

## 2020-12-27 DIAGNOSIS — M79642 Pain in left hand: Secondary | ICD-10-CM | POA: Insufficient documentation

## 2020-12-27 DIAGNOSIS — L578 Other skin changes due to chronic exposure to nonionizing radiation: Secondary | ICD-10-CM | POA: Diagnosis not present

## 2020-12-27 DIAGNOSIS — M25531 Pain in right wrist: Secondary | ICD-10-CM | POA: Diagnosis not present

## 2020-12-27 DIAGNOSIS — Z85828 Personal history of other malignant neoplasm of skin: Secondary | ICD-10-CM | POA: Diagnosis not present

## 2020-12-27 NOTE — Therapy (Signed)
[facility] [address] [place], [address] Phone: [phone]   Fax:  [phone]  Occupational Therapy Treatment  Patient Details  Name: Amber Richardson MRN: [id number] Date of Birth: Dec 12, 1955 Referring Provider (OT): DR Jefm Bryant   Encounter Date: 12/27/2020   OT End of Session - 12/27/20 1119     Visit Number 2    Number of Visits 5    Date for OT Re-Evaluation 01/21/21    OT Start Time 1032    OT Stop Time 1114    OT Time Calculation (min) 42 min    Activity Tolerance Patient tolerated treatment well    Behavior During Therapy [facility] for tasks assessed/performed             Past Medical History:  Diagnosis Date   Allergies    Basal cell carcinoma    Fatigue 08/30/2014   Generalized anxiety disorder 03/21/2011   GERD (gastroesophageal reflux disease)    History of COVID-19 02/22/2019   HLD (hyperlipidemia)    HSV infection 09/20/2014   Hypercholesterolemia 02/08/2015   Hyperglycemia 08/28/2019   Hyperthyroidism    Major depressive disorder    Osteoporosis 05/13/2015   Pyelonephritis 1985   Scalp, arm, and leg tingling 08/28/2019   Tendinosis 09/12/2013   Rotator cuff tendinosis. Mild capsulitis.   Tremor 08/28/2019   Subtle to mild, upper extremities, left worse than right    Past Surgical History:  Procedure Laterality Date   DILATION AND CURETTAGE OF UTERUS     Miscarriage     x1   TONSILLECTOMY  1963    There were no vitals filed for this visit.   Subjective Assessment - 12/27/20 1118     Subjective  About thse same - the thumbs the worse and then the R hand fingers worse than the L - depending on what I do - today 4/10 but increase depending on what I do    Pertinent History Pt seen DR Jefm Bryant with increase pain in joints - bone scans supports OA - per DR K note -Hesitant to give her more immunosuppression as she has not had response to nonsteroidals, her lab markers are  negative, no response to steroids  Encouraged her to see her dermatologist about her rash. And get back with [place]  Hand therapy  Add Neurontin 100 3 times daily for pain syndrome  See back 6 weeks    Patient Stated Goals Want the pain better so I can use my hands better and have more strength to open , grip , carry objects like my plate, Ipad    Currently in Pain? Yes    Pain Score 4     Pain Location Hand    Pain Orientation Right;Left    Pain Descriptors / Indicators Aching;Throbbing    Pain Type Chronic pain                [facility] OT Assessment - 12/27/20 0001       Strength   Right Hand Grip (lbs) 15    Right Hand Lateral Pinch 4 lbs    Right Hand 3 Point Pinch 4 lbs    Left Hand Grip (lbs) 25    Left Hand Lateral Pinch 6 lbs    Left Hand 3 Point Pinch 6 lbs      Right Hand AROM   R Index  MCP 0-90 90 Degrees    R Index PIP 0-100 90 Degrees    R  Long  MCP 0-90 90 Degrees    R Long PIP 0-100 85 Degrees    R Ring  MCP 0-90 90 Degrees    R Ring PIP 0-100 80 Degrees    R Little  MCP 0-90 90 Degrees    R Little PIP 0-100 80 Degrees      Left Hand AROM   L Index  MCP 0-90 90 Degrees    L Index PIP 0-100 100 Degrees    L Long  MCP 0-90 90 Degrees    L Long PIP 0-100 100 Degrees    L Ring  MCP 0-90 90 Degrees    L Ring PIP 0-100 100 Degrees    L Little  MCP 0-90 90 Degrees    L Little PIP 0-100 100 Degrees             Assess progress in AROM and grip and prehension  Improved in strength  AROM in R hand digits improved after paraffin and less pain  Thumb IP and MC flexion  Then less pain with opposition to 4th and 5th          OT Treatments/Exercises (OP) - 12/27/20 0001       RUE Paraffin   Number Minutes Paraffin 8 Minutes    RUE Paraffin Location Hand    Comments decrease pain      LUE Paraffin   Number Minutes Paraffin 8 Minutes    LUE Paraffin Location Wrist;Hand    Comments decrease pain            pt to get paraffin to use at home -  morning for stiffness and night time for pain prior to bed  Tendon glides pain free   IP and MC flexion of thumb  Opposition to all digist  And wrist AROM in shower  Cont joint protection and AE ed on         OT Education - 12/27/20 1119     Education Details parffin use and update HEP/ joint protection , splint wearing    Person(s) Educated Patient    Methods Explanation;Demonstration;Tactile cues;Verbal cues;Handout    Comprehension Verbal cues required;Returned demonstration;Verbalized understanding                 OT Long Term Goals - 12/10/20 1400       OT LONG TERM GOAL #1   Title Pt to show understanding of homeprogram for splints, modalities and joint protection to decrease pain less than 3-4/10 at the worse    Baseline pain in radial hand and wrist 6/10 or more - constant and no knowledge of homeprogram    Time 3    Period Weeks    Status New    Target Date 12/31/20      OT LONG TERM GOAL #2   Title Pt to show decrease pain to increase R hand digits AROM to WNL compare to L    Baseline R hand pain opposition to 4th and 5th- MC's 90 but PIP 65 to 90 - pain - not able to touch palm    Time 3    Period Weeks    Status New    Target Date 12/31/20      OT LONG TERM GOAL #3   Title Bilateral grip and prehension strength increase to within her range of age to be able to hold plate, turn doorknob and use in ADL's more than 50% with pain less than 3-4/10    Baseline report not  using her hands a lot - on phone and Ipad some - prop up - cannot carry or hold plate- cannot do things around the house - husband does most everything - watch tv Grip R 11,l 10 lbs ; Lat grip R 5, L4 lbs; 3 point inch R 3 , L 4 lbs    Time 6    Period Weeks    Status New    Target Date 01/21/21                   Plan - 12/27/20 1120     Clinical Impression Statement Pt refer to OT with increase hand pain since April 22 - OA diagnosis -  pt report pain in all major joints in  body - pt pain 4/10  in bilateral hands with bilateral thumbs and 2nd digits PIP  the worse. Stiffness in the morning - denies any sensation changes - did has some tenderness over couple of  trigger nodules but no triggering. Pt cont to show decrease  AROM  in R hand PIP's  and L AROM WNL but pain. Grip and prehension strength greatly impaired but did improve compare to last visit.  Cont to have limited use of hands in  use of hands in ADL's and IADL's. Pt  had less pain and increase pain free AROM after paraffin.Recommend for pt to get paraffin bath to ue at home and do AROM pain free. Cont use of  CMC neoprene splint on R. Pt to cont with hand out provided on joint protection and AE trng last time  - Pt to discuss with Dr Jefm Bryant about [facility] arthroplasty in future but try parafin first. Pt to follow up with my in 4-8 wks .    OT Occupational Profile and History Problem Focused Assessment - Including review of records relating to presenting problem    Occupational performance deficits (Please refer to evaluation for details): ADL's;IADL's;Play;Leisure;Social Participation    Body Structure / Function / Physical Skills ADL;Strength;Decreased knowledge of use of DME;Pain;Edema;UE functional use;IADL;ROM;Flexibility    Rehab Potential Fair    Clinical Decision Making Limited treatment options, no task modification necessary    Modification or Assistance to Complete Evaluation  No modification of tasks or assist necessary to complete eval    OT Frequency Monthly    OT Duration 6 weeks    OT Treatment/Interventions Self-care/ADL training;Contrast Bath;Paraffin;Energy conservation;Manual Therapy;Patient/family education;Splinting;DME and/or AE instruction;Therapeutic exercise    Consulted and Agree with Plan of Care Patient             Patient will benefit from skilled therapeutic intervention in order to improve the following deficits and impairments:   Body Structure / Function / Physical Skills: ADL,  Strength, Decreased knowledge of use of DME, Pain, Edema, UE functional use, IADL, ROM, Flexibility       Visit Diagnosis: Pain in left hand  Pain in right hand  Pain in right wrist  Pain in left wrist  Stiffness of right hand, not elsewhere classified  Muscle weakness (generalized)    Problem List Patient Active Problem List   Diagnosis Date Noted   Healthcare maintenance 11/11/2020   Rib pain on right side 09/30/2020   Joint pain 09/19/2020   Chest congestion 06/17/2020   Joint stiffness 06/17/2020   Change in vision 06/17/2020   Unsteady gait 06/17/2020   Moderate episode of recurrent major depressive disorder ([place]) 06/17/2020   Muscle weakness 06/13/2020   Stress 11/14/2019   Breast cancer screening  11/14/2019   Major depressive disorder    Disorder of bursae of shoulder region 09/01/2019   Knee pain 09/01/2019   Low back pain 87/57/9728   Plica syndrome 20/60/1561   Scalp, arm, and leg tingling 08/28/2019   Tremor 08/28/2019   Hyperglycemia 08/28/2019   History of COVID-19 02/20/2019   Left arm pain 01/09/2019   Neck pain 09/04/2018   Dizziness 12/19/2017   Dyspnea 09/13/2017   Left facial numbness 06/14/2015   Osteoporosis 05/13/2015   Hypercholesterolemia 02/08/2015   Left foot pain 11/04/2014   Atrophic vaginitis 09/20/2014   HSV infection 09/20/2014   Pelvic pain in female 09/20/2014   Fatigue 08/30/2014   Right calf pain 08/30/2014   Chest pain 08/30/2014   Abdominal pain 08/29/2014   Tendinosis 09/12/2013   Basal cell carcinoma 03/17/2012   Hyperthyroidism 03/17/2012   Fibroids 01/19/2012   BV (bacterial vaginosis) 01/05/2012   PMB (postmenopausal bleeding) 01/05/2012   Generalized anxiety disorder 03/21/2011   GERD (gastroesophageal reflux disease) 03/21/2011    Rosalyn Gess, OTR/L,CLT 12/27/2020, 12:18 PM  [facility] [address] [place], [address] Phone:  [phone]   Fax:  [phone]  Name: Amber Richardson MRN: [id number] Date of Birth: Dec 11, 1955

## 2021-01-08 DIAGNOSIS — M79641 Pain in right hand: Secondary | ICD-10-CM | POA: Diagnosis not present

## 2021-01-08 DIAGNOSIS — M79642 Pain in left hand: Secondary | ICD-10-CM | POA: Diagnosis not present

## 2021-01-08 DIAGNOSIS — M791 Myalgia, unspecified site: Secondary | ICD-10-CM | POA: Diagnosis not present

## 2021-01-23 ENCOUNTER — Telehealth (INDEPENDENT_AMBULATORY_CARE_PROVIDER_SITE_OTHER): Payer: 59 | Admitting: Family Medicine

## 2021-01-23 ENCOUNTER — Other Ambulatory Visit: Payer: Self-pay

## 2021-01-23 DIAGNOSIS — J0140 Acute pansinusitis, unspecified: Secondary | ICD-10-CM

## 2021-01-23 DIAGNOSIS — J329 Chronic sinusitis, unspecified: Secondary | ICD-10-CM | POA: Insufficient documentation

## 2021-01-23 MED ORDER — AMOXICILLIN-POT CLAVULANATE 875-125 MG PO TABS: 1.0000 | ORAL_TABLET | Freq: Two times a day (BID) | ORAL | 0 refills | Status: DC

## 2021-01-23 NOTE — Assessment & Plan Note (Addendum)
Her symptoms seem most consistent with sinusitis though she could have some measure of bronchitis.  Discussed treatment with antibiotics at this point.  We will prescribe Augmentin.  She was encouraged to eat yogurt or take a probiotic while on this to potentially help reduce her risk of GI issues.  If she has GI issues while taking the antibiotic she will let us know.  If she is not improving or if she worsens at all she will contact the office right away.  Advised if she is not feeling some improvement within 4 to 5 days on the antibiotic she should contact the office as well.

## 2021-01-23 NOTE — Progress Notes (Signed)
Virtual Visit via video Note  This visit type was conducted due to national recommendations for restrictions regarding the COVID-19 pandemic (e.g. social distancing).  This format is felt to be most appropriate for this patient at this time.  All issues noted in this document were discussed and addressed.  No physical exam was performed (except for noted visual exam findings with Video Visits).   I connected with [name] today at  2:30 PM EST by a video enabled telemedicine application and verified that I am speaking with the correct person using two identifiers. Location patient: home Location provider: work  Persons participating in the virtual visit: patient, provider  I discussed the limitations, risks, security and privacy concerns of performing an evaluation and management service by telephone and the availability of in person appointments. I also discussed with the patient that there may be a patient responsible charge related to this service. The patient expressed understanding and agreed to proceed.  Reason for visit: Same-day visit.  HPI: Sinus congestion: Patient notes this has been going on a couple of weeks.  She notes blowing yellow mucus out of her nose.  She is coughing up yellow mucus.  No fevers.  She had some mild dyspnea today and last night where she had trouble catching her breath after getting to the top of the stairs.  She notes no difficulty breathing at rest or walking on flat surfaces.  She noted some airway tightness that was brief this morning and resolved quickly.  She notes some sore throat.  No loss of taste or smell.  She did not test for COVID though her husband has been sick with similar symptoms and had 3 negative COVID test.  No known COVID exposure.  No known flu exposure.  She has been taking antihistamines and decongestants.   ROS: See pertinent positives and negatives per HPI.  Past Medical History:  Diagnosis Date   Allergies    Basal cell  carcinoma    Fatigue 08/30/2014   Generalized anxiety disorder 03/21/2011   GERD (gastroesophageal reflux disease)    History of COVID-19 02/22/2019   HLD (hyperlipidemia)    HSV infection 09/20/2014   Hypercholesterolemia 02/08/2015   Hyperglycemia 08/28/2019   Hyperthyroidism    Major depressive disorder    Osteoporosis 05/13/2015   Pyelonephritis 1985   Scalp, arm, and leg tingling 08/28/2019   Tendinosis 09/12/2013   Rotator cuff tendinosis. Mild capsulitis.   Tremor 08/28/2019   Subtle to mild, upper extremities, left worse than right    Past Surgical History:  Procedure Laterality Date   DILATION AND CURETTAGE OF UTERUS     Miscarriage     x1   TONSILLECTOMY  1963    Family History  Problem Relation Age of Onset   Hypertension Mother    Hypercholesterolemia Mother    Heart disease Father    Hyperlipidemia Father    Hypercholesterolemia Father    Stroke Father    Dementia Father        Vascular dementia related to stroke history    SOCIAL HX: Non-smoker   Current Outpatient Medications:    amoxicillin-clavulanate (AUGMENTIN) 875-125 MG tablet, Take 1 tablet by mouth 2 (two) times daily., Disp: 14 tablet, Rfl: 0   albuterol (VENTOLIN HFA) 108 (90 Base) MCG/ACT inhaler, TAKE 2 PUFFS BY MOUTH EVERY 6 HOURS AS NEEDED FOR WHEEZE OR SHORTNESS OF BREATH, Disp: 18 each, Rfl: 1   ALPRAZolam (XANAX) 0.5 MG tablet, Take by mouth., Disp: , Rfl:  Calcium Citrate-Vitamin D (CITRACAL + D PO), Take by mouth. Take one in the morning & 2 in the afternoon, Disp: , Rfl:    DULoxetine (CYMBALTA) 20 MG capsule, Take 20 mg by mouth daily., Disp: , Rfl:    DULoxetine (CYMBALTA) 60 MG capsule, Take 60 mg by mouth daily., Disp: , Rfl:    fluticasone (FLONASE) 50 MCG/ACT nasal spray, SPRAY 2 SPRAYS INTO EACH NOSTRIL EVERY DAY, Disp: 16 mL, Rfl: 1   levothyroxine (SYNTHROID) 75 MCG tablet, TAKE 1 TABLET BY MOUTH DAILY BEFORE BREAKFAST., Disp: 90 tablet, Rfl: 1   meloxicam (MOBIC) 7.5  MG tablet, Take 7.5 mg by mouth daily., Disp: , Rfl:    Multiple Vitamin (MULTIVITAMIN) tablet, Take 1 tablet by mouth daily., Disp: , Rfl:    traZODone (DESYREL) 50 MG tablet, 50 mg. Takes 1/2 tab po prn, Disp: , Rfl:    valACYclovir (VALTREX) 500 MG tablet, Take one to two tablets by mouth daily, Disp: , Rfl:    vitamin B-12 (CYANOCOBALAMIN) 1000 MCG tablet, Take 1 tablet (1,000 mcg total) by mouth daily., Disp: , Rfl:   EXAM: This was a telephone visit thus no physical exam was completed.  The patient was speaking in full sentences with no obvious difficulty over the phone.  ASSESSMENT AND PLAN:  Discussed the following assessment and plan:  Problem List Items Addressed This Visit     Sinusitis    Her symptoms seem most consistent with sinusitis though she could have some measure of bronchitis.  Discussed treatment with antibiotics at this point.  We will prescribe Augmentin.  She was encouraged to eat yogurt or take a probiotic while on this to potentially help reduce her risk of GI issues.  If she has GI issues while taking the antibiotic she will let us know.  If she is not improving or if she worsens at all she will contact the office right away.  Advised if she is not feeling some improvement within 4 to 5 days on the antibiotic she should contact the office as well.      Relevant Medications   amoxicillin-clavulanate (AUGMENTIN) 875-125 MG tablet    Return if symptoms worsen or fail to improve.   I discussed the assessment and treatment plan with the patient. The patient was provided an opportunity to ask questions and all were answered. The patient agreed with the plan and demonstrated an understanding of the instructions.   The patient was advised to call back or seek an in-person evaluation if the symptoms worsen or if the condition fails to improve as anticipated.  I provided 10 minutes of non-face-to-face time during this encounter.   Tommi Rumps, MD

## 2021-01-31 DIAGNOSIS — E7800 Pure hypercholesterolemia, unspecified: Secondary | ICD-10-CM | POA: Diagnosis not present

## 2021-01-31 DIAGNOSIS — R002 Palpitations: Secondary | ICD-10-CM | POA: Diagnosis not present

## 2021-01-31 DIAGNOSIS — R0789 Other chest pain: Secondary | ICD-10-CM | POA: Diagnosis not present

## 2021-02-15 ENCOUNTER — Ambulatory Visit (INDEPENDENT_AMBULATORY_CARE_PROVIDER_SITE_OTHER): Payer: 59 | Admitting: Internal Medicine

## 2021-02-15 ENCOUNTER — Other Ambulatory Visit: Payer: Self-pay

## 2021-02-15 VITALS — BP 112/70 | HR 63 | Temp 97.9°F | Resp 16 | Ht 67.0 in | Wt 167.0 lb

## 2021-02-15 DIAGNOSIS — E7800 Pure hypercholesterolemia, unspecified: Secondary | ICD-10-CM

## 2021-02-15 DIAGNOSIS — Z8616 Personal history of COVID-19: Secondary | ICD-10-CM

## 2021-02-15 DIAGNOSIS — Z1231 Encounter for screening mammogram for malignant neoplasm of breast: Secondary | ICD-10-CM | POA: Diagnosis not present

## 2021-02-15 DIAGNOSIS — R69 Illness, unspecified: Secondary | ICD-10-CM | POA: Diagnosis not present

## 2021-02-15 DIAGNOSIS — R739 Hyperglycemia, unspecified: Secondary | ICD-10-CM | POA: Diagnosis not present

## 2021-02-15 DIAGNOSIS — R5383 Other fatigue: Secondary | ICD-10-CM

## 2021-02-15 DIAGNOSIS — E0590 Thyrotoxicosis, unspecified without thyrotoxic crisis or storm: Secondary | ICD-10-CM

## 2021-02-15 DIAGNOSIS — F439 Reaction to severe stress, unspecified: Secondary | ICD-10-CM | POA: Diagnosis not present

## 2021-02-15 DIAGNOSIS — F331 Major depressive disorder, recurrent, moderate: Secondary | ICD-10-CM

## 2021-02-15 LAB — CBC WITH DIFFERENTIAL/PLATELET
Basophils Absolute: 0 10*3/uL (ref 0.0–0.1)
Basophils Relative: 0.6 % (ref 0.0–3.0)
Eosinophils Absolute: 0.1 10*3/uL (ref 0.0–0.7)
Eosinophils Relative: 1.6 % (ref 0.0–5.0)
HCT: 41.7 % (ref 36.0–46.0)
Hemoglobin: 13.5 g/dL (ref 12.0–15.0)
Lymphocytes Relative: 35.1 % (ref 12.0–46.0)
Lymphs Abs: 1.8 10*3/uL (ref 0.7–4.0)
MCHC: 32.4 g/dL (ref 30.0–36.0)
MCV: 98.1 fl (ref 78.0–100.0)
Monocytes Absolute: 0.4 10*3/uL (ref 0.1–1.0)
Monocytes Relative: 8.6 % (ref 3.0–12.0)
Neutro Abs: 2.7 10*3/uL (ref 1.4–7.7)
Neutrophils Relative %: 54.1 % (ref 43.0–77.0)
Platelets: 228 10*3/uL (ref 150.0–400.0)
RBC: 4.25 Mil/uL (ref 3.87–5.11)
RDW: 13.4 % (ref 11.5–15.5)
WBC: 5 10*3/uL (ref 4.0–10.5)

## 2021-02-15 LAB — BASIC METABOLIC PANEL
BUN: 23 mg/dL (ref 6–23)
CO2: 30 mEq/L (ref 19–32)
Calcium: 9.6 mg/dL (ref 8.4–10.5)
Chloride: 101 mEq/L (ref 96–112)
Creatinine, Ser: 1.04 mg/dL (ref 0.40–1.20)
GFR: 57.37 mL/min — ABNORMAL LOW (ref >60.00)
Glucose, Bld: 91 mg/dL (ref 70–99)
Potassium: 4.7 mEq/L (ref 3.5–5.1)
Sodium: 136 mEq/L (ref 135–145)

## 2021-02-15 LAB — LIPID PANEL
Cholesterol: 310 mg/dL — ABNORMAL HIGH (ref 0–200)
HDL: 76.6 mg/dL (ref >39.00)
LDL Cholesterol: 206 mg/dL — ABNORMAL HIGH (ref 0–99)
NonHDL: 233.3
Total CHOL/HDL Ratio: 4
Triglycerides: 136 mg/dL (ref 0.0–149.0)
VLDL: 27.2 mg/dL (ref 0.0–40.0)

## 2021-02-15 LAB — HEPATIC FUNCTION PANEL
ALT: 23 U/L (ref 0–35)
AST: 22 U/L (ref 0–37)
Albumin: 4.7 g/dL (ref 3.5–5.2)
Alkaline Phosphatase: 105 U/L (ref 39–117)
Bilirubin, Direct: 0.1 mg/dL (ref 0.0–0.3)
Total Bilirubin: 0.6 mg/dL (ref 0.2–1.2)
Total Protein: 7.3 g/dL (ref 6.0–8.3)

## 2021-02-15 LAB — HEMOGLOBIN A1C: Hgb A1c MFr Bld: 5.7 % (ref 4.6–6.5)

## 2021-02-15 LAB — TSH: TSH: 4.25 u[IU]/mL (ref 0.35–5.50)

## 2021-02-15 NOTE — Progress Notes (Signed)
Patient ID: Amber Richardson, female   DOB: August 10, 1955, 66 y.o.   MRN: [id number]   Subjective:    Patient ID: Amber Richardson, female    DOB: 1955-02-06, 66 y.o.   MRN: [id number]  This visit occurred during the SARS-CoV-2 public health emergency.  Safety protocols were in place, including screening questions prior to the visit, additional usage of staff PPE, and extensive cleaning of exam room while observing appropriate contact time as indicated for disinfecting solutions.   Patient here for a scheduled follow up.   Marland Kitchen   HPI Here to follow up regarding her cholesterol, post covid issues and palpitations.  Saw cardiology 01/31/21 - evaluation palpitations.  Stress echo 2019 - ok.  No changes made.  Appears to be stable currently.  Trying to stay active.  No chest pain reported.  Breathing stable.  Treated for sinus infection 12/28.  Better.  No increased cough or congestion reported.  Increased stress - family stress.  Discussed.  Having to take care of her mother.  This is increasing stress with her and her husband.  Discussed getting help in the home for her mother.  Will notify me if desires.  Eating.  Concern regarding weight gain.  Discussed diet and exercise.  Some fatigue.  Has f/u with psychiatry - end of month.  Seeing rheumatology.  Has OA.  On diclofenac and gabapentin.  Has helped some.     Past Medical History:  Diagnosis Date   Allergies    Basal cell carcinoma    Fatigue 08/30/2014   Generalized anxiety disorder 03/21/2011   GERD (gastroesophageal reflux disease)    History of COVID-19 02/22/2019   HLD (hyperlipidemia)    HSV infection 09/20/2014   Hypercholesterolemia 02/08/2015   Hyperglycemia 08/28/2019   Hyperthyroidism    Major depressive disorder    Osteoporosis 05/13/2015   Pyelonephritis 1985   Scalp, arm, and leg tingling 08/28/2019   Tendinosis 09/12/2013   Rotator cuff tendinosis. Mild capsulitis.   Tremor 08/28/2019   Subtle to mild, upper extremities, left  worse than right   Past Surgical History:  Procedure Laterality Date   DILATION AND CURETTAGE OF UTERUS     Miscarriage     x1   TONSILLECTOMY  1963   Family History  Problem Relation Age of Onset   Hypertension Mother    Hypercholesterolemia Mother    Heart disease Father    Hyperlipidemia Father    Hypercholesterolemia Father    Stroke Father    Dementia Father        Vascular dementia related to stroke history   Social History   Socioeconomic History   Marital status: Married    Spouse name: Not on file   Number of children: 0   Years of education: 16   Highest education level: Bachelor's degree (e.g., BA, AB, BS)  Occupational History   Not on file  Tobacco Use   Smoking status: Never   Smokeless tobacco: Never  Vaping Use   Vaping Use: Never used  Substance and Sexual Activity   Alcohol use: No    Alcohol/week: 0.0 standard drinks   Drug use: No   Sexual activity: Not on file  Other Topics Concern   Not on file  Social History Narrative   Right handed   Drinks one cup coffee am and one pepsi   Social Determinants of Health   Financial Resource Strain: Not on file  Food Insecurity: Not on file  Transportation Needs: Not on file  Physical Activity: Not on file  Stress: Not on file  Social Connections: Not on file     Review of Systems  Constitutional:  Positive for fatigue. Negative for appetite change.  HENT:  Negative for congestion and sinus pressure.   Respiratory:  Negative for cough, chest tightness and shortness of breath.   Cardiovascular:  Negative for chest pain, palpitations and leg swelling.  Gastrointestinal:  Negative for abdominal pain, diarrhea, nausea and vomiting.  Genitourinary:  Negative for difficulty urinating and dysuria.  Musculoskeletal:  Negative for myalgias.       Joint aches - OA.   Skin:  Negative for color change and rash.  Neurological:  Negative for dizziness, light-headedness and headaches.   Psychiatric/Behavioral:  Negative for agitation.        Increased stress as outlined.       Objective:     BP 112/70    Pulse 63    Temp 97.9 F (36.6 C)    Resp 16    Ht '5\' 7"'  (1.702 m)    Wt 167 lb (75.8 kg)    LMP 01/13/2008    SpO2 98%    BMI 26.16 kg/m  Wt Readings from Last 3 Encounters:  02/15/21 167 lb (75.8 kg)  11/07/20 168 lb 6.4 oz (76.4 kg)  09/19/20 165 lb 9.6 oz (75.1 kg)    Physical Exam Vitals reviewed.  Constitutional:      General: She is not in acute distress.    Appearance: Normal appearance.  HENT:     Head: Normocephalic and atraumatic.     Right Ear: External ear normal.     Left Ear: External ear normal.  Eyes:     General: No scleral icterus.       Right eye: No discharge.        Left eye: No discharge.     Conjunctiva/sclera: Conjunctivae normal.  Neck:     Thyroid: No thyromegaly.  Cardiovascular:     Rate and Rhythm: Normal rate and regular rhythm.  Pulmonary:     Effort: No respiratory distress.     Breath sounds: Normal breath sounds. No wheezing.  Abdominal:     General: Bowel sounds are normal.     Palpations: Abdomen is soft.     Tenderness: There is no abdominal tenderness.  Musculoskeletal:        General: No swelling or tenderness.     Cervical back: Neck supple. No tenderness.  Lymphadenopathy:     Cervical: No cervical adenopathy.  Skin:    Findings: No erythema or rash.  Neurological:     Mental Status: She is alert.  Psychiatric:        Mood and Affect: Mood normal.        Behavior: Behavior normal.     Outpatient Encounter Medications as of 02/15/2021  Medication Sig   albuterol (VENTOLIN HFA) 108 (90 Base) MCG/ACT inhaler TAKE 2 PUFFS BY MOUTH EVERY 6 HOURS AS NEEDED FOR WHEEZE OR SHORTNESS OF BREATH   ALPRAZolam (XANAX) 0.5 MG tablet Take by mouth.   Calcium Citrate-Vitamin D (CITRACAL + D PO) Take by mouth. Take one in the morning & 2 in the afternoon   diclofenac (VOLTAREN) 50 MG EC tablet Take 50 mg by mouth  2 (two) times daily.   DULoxetine (CYMBALTA) 20 MG capsule Take 20 mg by mouth daily.   DULoxetine (CYMBALTA) 60 MG capsule Take 60 mg by mouth daily.   fluticasone (FLONASE) 50 MCG/ACT nasal spray  SPRAY 2 SPRAYS INTO EACH NOSTRIL EVERY DAY   gabapentin (NEURONTIN) 100 MG capsule Take 100 mg by mouth 2 (two) times daily.   levothyroxine (SYNTHROID) 75 MCG tablet TAKE 1 TABLET BY MOUTH DAILY BEFORE BREAKFAST.   meloxicam (MOBIC) 7.5 MG tablet Take 7.5 mg by mouth daily.   Multiple Vitamin (MULTIVITAMIN) tablet Take 1 tablet by mouth daily.   traZODone (DESYREL) 50 MG tablet 50 mg. Takes 1/2 tab po prn   valACYclovir (VALTREX) 500 MG tablet Take one to two tablets by mouth daily   vitamin B-12 (CYANOCOBALAMIN) 1000 MCG tablet Take 1 tablet (1,000 mcg total) by mouth daily.   [DISCONTINUED] amoxicillin-clavulanate (AUGMENTIN) 875-125 MG tablet Take 1 tablet by mouth 2 (two) times daily.   No facility-administered encounter medications on file as of 02/15/2021.     Lab Results  Component Value Date   WBC 5.0 02/15/2021   HGB 13.5 02/15/2021   HCT 41.7 02/15/2021   PLT 228.0 02/15/2021   GLUCOSE 91 02/15/2021   CHOL 310 (H) 02/15/2021   TRIG 136.0 02/15/2021   HDL 76.60 02/15/2021   LDLDIRECT 165.6 03/23/2012   LDLCALC 206 (H) 02/15/2021   ALT 23 02/15/2021   AST 22 02/15/2021   NA 136 02/15/2021   K 4.7 02/15/2021   CL 101 02/15/2021   CREATININE 1.04 02/15/2021   BUN 23 02/15/2021   CO2 30 02/15/2021   TSH 4.25 02/15/2021   HGBA1C 5.7 02/15/2021    US Abdomen Complete  Result Date: 10/09/2020 CLINICAL DATA:  Right-sided and right upper quadrant abdominal pain for 6 weeks EXAM: ABDOMEN ULTRASOUND COMPLETE COMPARISON:  Abdominal ultrasound 09/06/2014, CT abdomen/pelvis 12/16/2012 FINDINGS: Gallbladder: No gallstones or wall thickening visualized. No sonographic Murphy sign noted by sonographer. Common bile duct: Diameter: 4 mm Liver: No focal lesion identified. Within normal  limits in parenchymal echogenicity. Portal vein is patent on color Doppler imaging with normal direction of blood flow towards the liver. IVC: No abnormality visualized. Pancreas: Visualized portion unremarkable. Spleen: Size and appearance within normal limits. Right Kidney: Length: 10.5 cm. Echogenicity within normal limits. No mass or hydronephrosis visualized. Left Kidney: Length: 10.6 cm. Echogenicity within normal limits. No mass or hydronephrosis visualized. Abdominal aorta: No aneurysm visualized. Other findings: None. IMPRESSION: Normal abdominal ultrasound. Electronically Signed   By: Valetta Mole M.D.   On: 10/09/2020 13:24       Assessment & Plan:   Problem List Items Addressed This Visit     Breast cancer screening    Mammogram 05/02/20 - ok.       Fatigue    Felt to be multifactorial.  Check labs.  Increased stress. Keep f/u with psychiatry.       History of COVID-19    Since covid has had multiple residual symptoms/problems.  Initially had persistent sob and DOE.  Had extensive evaluation and w/up by pulmonary.  Breathing has been better overall.  Had described persistent problems with memory, tremors, paresthesias and trouble concentrating and focusing.  MRI normal.  NCS and EMG normal.  Saw neurology.  No clear neurological etiology found.  She is seeing psychiatry - treating anxiety and depression.  Now with increased joint stiffness and pain.  Diagnosed with OA.  On diclofenac and gabapentin.  Keep w/u with  Rheumatology.       Hypercholesterolemia    Low cholesterol diet and exercise as tolerated.  Follow.       Relevant Orders   Basic metabolic panel (Completed)   Lipid panel (  Completed)   Hepatic function panel (Completed)   CBC with Differential/Platelet (Completed)   Hyperglycemia    Low carb diet and exercise.  Follow met b and a1c.       Relevant Orders   Hemoglobin A1c (Completed)   Hyperthyroidism - Primary    Off tapazole.  On synthroid.  Follow tsh.        Relevant Orders   TSH (Completed)   Moderate episode of recurrent major depressive disorder ([place])    Followed by psychiatry.  F/u later this month.  Continues on cymbalta.       Stress    Increased stress as outlined.  Discussed.  Offered to see if could arrange for help with her mother.  Will let me know.  Has f/u with psychiatry later this month.  Plans to discuss with them regarding medication adjustment.  No SI.         Einar Pheasant, MD

## 2021-02-18 ENCOUNTER — Encounter: Payer: Self-pay | Admitting: Internal Medicine

## 2021-02-18 NOTE — Assessment & Plan Note (Signed)
Low carb diet and exercise.  Follow met b and a1c.

## 2021-02-18 NOTE — Assessment & Plan Note (Addendum)
Increased stress as outlined.  Discussed.  Offered to see if could arrange for help with her mother.  Will let me know.  Has f/u with psychiatry later this month.  Plans to discuss with them regarding medication adjustment.  No SI.

## 2021-02-18 NOTE — Assessment & Plan Note (Signed)
Since covid has had multiple residual symptoms/problems.  Initially had persistent sob and DOE.  Had extensive evaluation and w/up by pulmonary.  Breathing has been better overall.  Had described persistent problems with memory, tremors, paresthesias and trouble concentrating and focusing.  MRI normal.  NCS and EMG normal.  Saw neurology.  No clear neurological etiology found.  She is seeing psychiatry - treating anxiety and depression.  Now with increased joint stiffness and pain.  Diagnosed with OA.  On diclofenac and gabapentin.  Keep w/u with  Rheumatology.

## 2021-02-18 NOTE — Assessment & Plan Note (Signed)
Low cholesterol diet and exercise as tolerated.  Follow.  

## 2021-02-18 NOTE — Assessment & Plan Note (Signed)
Off tapazole.  On synthroid.  Follow tsh.  

## 2021-02-18 NOTE — Assessment & Plan Note (Signed)
Mammogram 05/02/20 - ok.

## 2021-02-18 NOTE — Assessment & Plan Note (Signed)
Followed by psychiatry.  F/u later this month.  Continues on cymbalta.

## 2021-02-18 NOTE — Assessment & Plan Note (Signed)
Felt to be multifactorial.  Check labs.  Increased stress. Keep f/u with psychiatry.

## 2021-02-20 ENCOUNTER — Other Ambulatory Visit: Payer: Self-pay

## 2021-02-20 DIAGNOSIS — R944 Abnormal results of kidney function studies: Secondary | ICD-10-CM

## 2021-02-20 DIAGNOSIS — E7800 Pure hypercholesterolemia, unspecified: Secondary | ICD-10-CM

## 2021-03-06 DIAGNOSIS — F411 Generalized anxiety disorder: Secondary | ICD-10-CM | POA: Diagnosis not present

## 2021-03-06 DIAGNOSIS — R69 Illness, unspecified: Secondary | ICD-10-CM | POA: Diagnosis not present

## 2021-03-15 ENCOUNTER — Other Ambulatory Visit: Payer: 59

## 2021-03-21 DIAGNOSIS — L853 Xerosis cutis: Secondary | ICD-10-CM | POA: Diagnosis not present

## 2021-03-21 DIAGNOSIS — D485 Neoplasm of uncertain behavior of skin: Secondary | ICD-10-CM | POA: Diagnosis not present

## 2021-03-21 DIAGNOSIS — B353 Tinea pedis: Secondary | ICD-10-CM | POA: Diagnosis not present

## 2021-03-21 DIAGNOSIS — L578 Other skin changes due to chronic exposure to nonionizing radiation: Secondary | ICD-10-CM | POA: Diagnosis not present

## 2021-03-21 DIAGNOSIS — D235 Other benign neoplasm of skin of trunk: Secondary | ICD-10-CM | POA: Diagnosis not present

## 2021-03-21 DIAGNOSIS — Z85828 Personal history of other malignant neoplasm of skin: Secondary | ICD-10-CM | POA: Diagnosis not present

## 2021-03-22 ENCOUNTER — Other Ambulatory Visit: Payer: 59

## 2021-03-26 ENCOUNTER — Other Ambulatory Visit: Payer: Self-pay

## 2021-03-26 ENCOUNTER — Other Ambulatory Visit (INDEPENDENT_AMBULATORY_CARE_PROVIDER_SITE_OTHER): Payer: 59

## 2021-03-26 DIAGNOSIS — R944 Abnormal results of kidney function studies: Secondary | ICD-10-CM

## 2021-03-26 LAB — URINALYSIS, ROUTINE W REFLEX MICROSCOPIC
Bilirubin Urine: NEGATIVE
Hgb urine dipstick: NEGATIVE
Ketones, ur: NEGATIVE
Leukocytes,Ua: NEGATIVE
Nitrite: NEGATIVE
RBC / HPF: NONE SEEN (ref 0–?)
Specific Gravity, Urine: 1.015 (ref 1.000–1.030)
Total Protein, Urine: NEGATIVE
Urine Glucose: NEGATIVE
Urobilinogen, UA: 1 (ref 0.0–1.0)
pH: 7 (ref 5.0–8.0)

## 2021-03-26 LAB — BASIC METABOLIC PANEL
BUN: 24 mg/dL — ABNORMAL HIGH (ref 6–23)
CO2: 27 mEq/L (ref 19–32)
Calcium: 9.3 mg/dL (ref 8.4–10.5)
Chloride: 104 mEq/L (ref 96–112)
Creatinine, Ser: 0.93 mg/dL (ref 0.40–1.20)
GFR: 65.56 mL/min (ref >60.00)
Glucose, Bld: 93 mg/dL (ref 70–99)
Potassium: 4.2 mEq/L (ref 3.5–5.1)
Sodium: 139 mEq/L (ref 135–145)

## 2021-03-31 ENCOUNTER — Other Ambulatory Visit: Payer: Self-pay | Admitting: Internal Medicine

## 2021-04-01 ENCOUNTER — Telehealth: Payer: 59 | Admitting: Family Medicine

## 2021-04-08 DIAGNOSIS — M791 Myalgia, unspecified site: Secondary | ICD-10-CM | POA: Diagnosis not present

## 2021-04-08 DIAGNOSIS — M159 Polyosteoarthritis, unspecified: Secondary | ICD-10-CM | POA: Diagnosis not present

## 2021-04-10 DIAGNOSIS — R69 Illness, unspecified: Secondary | ICD-10-CM | POA: Diagnosis not present

## 2021-04-10 DIAGNOSIS — F330 Major depressive disorder, recurrent, mild: Secondary | ICD-10-CM | POA: Diagnosis not present

## 2021-04-23 DIAGNOSIS — D0359 Melanoma in situ of other part of trunk: Secondary | ICD-10-CM | POA: Diagnosis not present

## 2021-04-23 DIAGNOSIS — D485 Neoplasm of uncertain behavior of skin: Secondary | ICD-10-CM | POA: Diagnosis not present

## 2021-04-23 DIAGNOSIS — D225 Melanocytic nevi of trunk: Secondary | ICD-10-CM | POA: Diagnosis not present

## 2021-05-20 ENCOUNTER — Ambulatory Visit (INDEPENDENT_AMBULATORY_CARE_PROVIDER_SITE_OTHER): Payer: 59 | Admitting: Internal Medicine

## 2021-05-20 ENCOUNTER — Encounter: Payer: Self-pay | Admitting: Internal Medicine

## 2021-05-20 VITALS — BP 110/68 | HR 55 | Temp 97.9°F | Resp 21 | Ht 67.0 in | Wt 165.0 lb

## 2021-05-20 DIAGNOSIS — F331 Major depressive disorder, recurrent, moderate: Secondary | ICD-10-CM

## 2021-05-20 DIAGNOSIS — N289 Disorder of kidney and ureter, unspecified: Secondary | ICD-10-CM

## 2021-05-20 DIAGNOSIS — R5383 Other fatigue: Secondary | ICD-10-CM | POA: Diagnosis not present

## 2021-05-20 DIAGNOSIS — M810 Age-related osteoporosis without current pathological fracture: Secondary | ICD-10-CM

## 2021-05-20 DIAGNOSIS — E7800 Pure hypercholesterolemia, unspecified: Secondary | ICD-10-CM | POA: Diagnosis not present

## 2021-05-20 DIAGNOSIS — E0590 Thyrotoxicosis, unspecified without thyrotoxic crisis or storm: Secondary | ICD-10-CM

## 2021-05-20 DIAGNOSIS — Z0000 Encounter for general adult medical examination without abnormal findings: Secondary | ICD-10-CM | POA: Diagnosis not present

## 2021-05-20 DIAGNOSIS — R69 Illness, unspecified: Secondary | ICD-10-CM | POA: Diagnosis not present

## 2021-05-20 DIAGNOSIS — F411 Generalized anxiety disorder: Secondary | ICD-10-CM

## 2021-05-20 DIAGNOSIS — Z1231 Encounter for screening mammogram for malignant neoplasm of breast: Secondary | ICD-10-CM | POA: Diagnosis not present

## 2021-05-20 DIAGNOSIS — F439 Reaction to severe stress, unspecified: Secondary | ICD-10-CM

## 2021-05-20 DIAGNOSIS — R739 Hyperglycemia, unspecified: Secondary | ICD-10-CM | POA: Diagnosis not present

## 2021-05-20 LAB — COMPREHENSIVE METABOLIC PANEL
ALT: 41 U/L — ABNORMAL HIGH (ref 0–35)
AST: 35 U/L (ref 0–37)
Albumin: 4.5 g/dL (ref 3.5–5.2)
Alkaline Phosphatase: 109 U/L (ref 39–117)
BUN: 23 mg/dL (ref 6–23)
CO2: 29 mEq/L (ref 19–32)
Calcium: 9.4 mg/dL (ref 8.4–10.5)
Chloride: 103 mEq/L (ref 96–112)
Creatinine, Ser: 0.99 mg/dL (ref 0.40–1.20)
GFR: 60.75 mL/min (ref >60.00)
Glucose, Bld: 89 mg/dL (ref 70–99)
Potassium: 4.4 mEq/L (ref 3.5–5.1)
Sodium: 139 mEq/L (ref 135–145)
Total Bilirubin: 0.8 mg/dL (ref 0.2–1.2)
Total Protein: 6.7 g/dL (ref 6.0–8.3)

## 2021-05-20 LAB — TSH: TSH: 4.43 u[IU]/mL (ref 0.35–5.50)

## 2021-05-20 LAB — HEMOGLOBIN A1C: Hgb A1c MFr Bld: 5.8 % (ref 4.6–6.5)

## 2021-05-20 NOTE — Progress Notes (Signed)
Patient ID: Amber Richardson, female   DOB: 1955/10/10, 66 y.o.   MRN: [id number] ? ? ?Subjective:  ? ? Patient ID: Amber Richardson, female    DOB: 1955/05/07, 66 y.o.   MRN: [id number] ? ?This visit occurred during the SARS-CoV-2 public health emergency.  Safety protocols were in place, including screening questions prior to the visit, additional usage of staff PPE, and extensive cleaning of exam room while observing appropriate contact time as indicated for disinfecting solutions.  ? ?Patient here for physical exam.  ? ?Chief Complaint  ?Patient presents with  ? Annual Exam  ?  Annual Exam - feeling well  ? .  ? ?HPI ?Gets her breasts, pelvic and pap smears at gyn.  Is up to date per pt.  She is on gabapentin.  Feels has helped.  Is able to be a little more active.  No chest pain or sob reported.  No abdominal pain or bowel change reported.  Seeing rheumatology.  On declofenac and gabapentin.  Some minimal constipation and dry mouth with the gabapentin, but takes otc medication for her bowels and this helps to keep her regular.  Not a major issue for her.  Desires not to get her cholesterol checked today.  Insurance is going to change.   ? ? ?Past Medical History:  ?Diagnosis Date  ? Allergies   ? Basal cell carcinoma   ? Fatigue 08/30/2014  ? Generalized anxiety disorder 03/21/2011  ? GERD (gastroesophageal reflux disease)   ? History of COVID-19 02/22/2019  ? HLD (hyperlipidemia)   ? 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 Hypercholesterolemia 02/08/2015  ? Hyperglycemia 08/28/2019  ? Hyperthyroidism   ? Major depressive disorder   ? Osteoporosis 05/13/2015  ? Pyelonephritis 1985  ? Scalp, arm, and leg tingling 08/28/2019  ? Tendinosis 09/12/2013  ? Rotator cuff tendinosis. Mild capsulitis.  ? Tremor 08/28/2019  ? Subtle to mild, upper extremities, left worse than right  ? ?Past Surgical History:  ?Procedure Laterality Date  ? DILATION AND CURETTAGE OF UTERUS    ? Miscarriage    ? x1  ? TONSILLECTOMY  1963  ? ?Family History   ?Problem Relation Age of Onset  ? Hypertension Mother   ? Hypercholesterolemia Mother   ? Heart disease Father   ? Hyperlipidemia Father   ? Hypercholesterolemia Father   ? Stroke Father   ? Dementia Father   ?     Vascular dementia related to stroke history  ? ?Social History  ? ?Socioeconomic History  ? Marital status: Married  ?  Spouse name: Not on file  ? Number of children: 0  ? Years of education: 89  ? Highest education level: Bachelor's degree (e.g., BA, AB, BS)  ?Occupational History  ? Not on file  ?Tobacco Use  ? Smoking status: Never  ? Smokeless tobacco: Never  ?Vaping Use  ? Vaping Use: Never used  ?Substance and Sexual Activity  ? Alcohol use: No  ?  Alcohol/week: 0.0 standard drinks  ? Drug use: No  ? Sexual activity: Not on file  ?Other Topics Concern  ? Not on file  ?Social History Narrative  ? Right handed  ? Drinks one cup coffee am and one pepsi  ? ?Social Determinants of Health  ? ?Financial Resource Strain: Not on file  ?Food Insecurity: Not on file  ?Transportation Needs: Not on file  ?Physical Activity: Not on file  ?Stress: Not on file  ?Social Connections: Not on file  ? ? ? ?Review of Systems  ?  Constitutional:  Negative for appetite change and unexpected weight change.  ?HENT:  Negative for congestion, sinus pressure and sore throat.   ?Eyes:  Negative for pain and visual disturbance.  ?Respiratory:  Negative for cough, chest tightness and shortness of breath.   ?Cardiovascular:  Negative for chest pain, palpitations and leg swelling.  ?Gastrointestinal:  Negative for abdominal pain, diarrhea, nausea and vomiting.  ?Genitourinary:  Negative for difficulty urinating and dysuria.  ?Musculoskeletal:  Negative for joint swelling and myalgias.  ?Skin:  Negative for color change and rash.  ?Neurological:  Negative for dizziness and headaches.  ?Hematological:  Negative for adenopathy. Does not bruise/bleed easily.  ?Psychiatric/Behavioral:  Negative for agitation, decreased concentration and  dysphoric mood.   ? ?   ?Objective:  ?  ? ?BP 110/68 (BP Location: Left Arm, Patient Position: Sitting, Cuff Size: Small)   Pulse (!) 55   Temp 97.9 ?F (36.6 ?C) (Temporal)   Resp (!) 21   Ht '5\' 7"'$  (1.702 m)   Wt 165 lb (74.8 kg)   LMP 01/13/2008   SpO2 99%   BMI 25.84 kg/m?  ?Wt Readings from Last 3 Encounters:  ?05/20/21 165 lb (74.8 kg)  ?02/15/21 167 lb (75.8 kg)  ?11/07/20 168 lb 6.4 oz (76.4 kg)  ? ? ?Physical Exam ?Vitals reviewed.  ?Constitutional:   ?   General: She is not in acute distress. ?   Appearance: Normal appearance.  ?HENT:  ?   Head: Normocephalic and atraumatic.  ?   Right Ear: External ear normal.  ?   Left Ear: External ear normal.  ?Eyes:  ?   General: No scleral icterus.    ?   Right eye: No discharge.     ?   Left eye: No discharge.  ?   Conjunctiva/sclera: Conjunctivae normal.  ?Neck:  ?   Thyroid: No thyromegaly.  ?Cardiovascular:  ?   Rate and Rhythm: Normal rate and regular rhythm.  ?Pulmonary:  ?   Effort: No respiratory distress.  ?   Breath sounds: Normal breath sounds. No wheezing.  ?Abdominal:  ?   General: Bowel sounds are normal.  ?   Palpations: Abdomen is soft.  ?   Tenderness: There is no abdominal tenderness.  ?Musculoskeletal:     ?   General: No swelling or tenderness.  ?   Cervical back: Neck supple. No tenderness.  ?Lymphadenopathy:  ?   Cervical: No cervical adenopathy.  ?Skin: ?   Findings: No erythema or rash.  ?Neurological:  ?   Mental Status: She is alert.  ?Psychiatric:     ?   Mood and Affect: Mood normal.     ?   Behavior: Behavior normal.  ? ? ? ?Outpatient Encounter Medications as of 05/20/2021  ?Medication Sig  ? albuterol (VENTOLIN HFA) 108 (90 Base) MCG/ACT inhaler TAKE 2 PUFFS BY MOUTH EVERY 6 HOURS AS NEEDED FOR WHEEZE OR SHORTNESS OF BREATH  ? ALPRAZolam (XANAX) 0.5 MG tablet Take by mouth.  ? Calcium Citrate-Vitamin D (CITRACAL + D PO) Take by mouth. Take one in the morning & 2 in the afternoon  ? diclofenac (VOLTAREN) 50 MG EC tablet Take 50 mg  by mouth 2 (two) times daily.  ? DULoxetine (CYMBALTA) 20 MG capsule Take 20 mg by mouth daily.  ? DULoxetine (CYMBALTA) 60 MG capsule Take 60 mg by mouth daily.  ? fluticasone (FLONASE) 50 MCG/ACT nasal spray SPRAY 2 SPRAYS INTO EACH NOSTRIL EVERY DAY  ? gabapentin (NEURONTIN) 100 MG  capsule Take 100 mg by mouth 2 (two) times daily.  ? levothyroxine (SYNTHROID) 75 MCG tablet TAKE 1 TABLET BY MOUTH EVERY DAY BEFORE BREAKFAST  ? Multiple Vitamin (MULTIVITAMIN) tablet Take 1 tablet by mouth daily.  ? traZODone (DESYREL) 50 MG tablet 50 mg. Takes 1/2 tab po prn  ? valACYclovir (VALTREX) 500 MG tablet Take one to two tablets by mouth daily  ? vitamin B-12 (CYANOCOBALAMIN) 1000 MCG tablet Take 1 tablet (1,000 mcg total) by mouth daily.  ? [DISCONTINUED] meloxicam (MOBIC) 7.5 MG tablet Take 7.5 mg by mouth daily. (Patient not taking: Reported on 05/20/2021)  ? ?No facility-administered encounter medications on file as of 05/20/2021.  ?  ? ?Lab Results  ?Component Value Date  ? WBC 5.0 02/15/2021  ? HGB 13.5 02/15/2021  ? HCT 41.7 02/15/2021  ? PLT 228.0 02/15/2021  ? GLUCOSE 89 05/20/2021  ? CHOL 310 (H) 02/15/2021  ? TRIG 136.0 02/15/2021  ? HDL 76.60 02/15/2021  ? LDLDIRECT 165.6 03/23/2012  ? [place] 206 (H) 02/15/2021  ? ALT 41 (H) 05/20/2021  ? AST 35 05/20/2021  ? NA 139 05/20/2021  ? K 4.4 05/20/2021  ? CL 103 05/20/2021  ? CREATININE 0.99 05/20/2021  ? BUN 23 05/20/2021  ? CO2 29 05/20/2021  ? TSH 4.43 05/20/2021  ? HGBA1C 5.8 05/20/2021  ? ? ?US Abdomen Complete ? ?Result Date: 10/09/2020 ?CLINICAL DATA:  Right-sided and right upper quadrant abdominal pain for 6 weeks EXAM: ABDOMEN ULTRASOUND COMPLETE COMPARISON:  Abdominal ultrasound 09/06/2014, CT abdomen/pelvis 12/16/2012 FINDINGS: Gallbladder: No gallstones or wall thickening visualized. No sonographic Murphy sign noted by sonographer. Common bile duct: Diameter: 4 mm Liver: No focal lesion identified. Within normal limits in parenchymal echogenicity. Portal  vein is patent on color Doppler imaging with normal direction of blood flow towards the liver. IVC: No abnormality visualized. Pancreas: Visualized portion unremarkable. Spleen: Size and appearance within normal l

## 2021-05-21 ENCOUNTER — Other Ambulatory Visit: Payer: Self-pay

## 2021-05-21 DIAGNOSIS — R748 Abnormal levels of other serum enzymes: Secondary | ICD-10-CM

## 2021-05-23 DIAGNOSIS — D485 Neoplasm of uncertain behavior of skin: Secondary | ICD-10-CM | POA: Diagnosis not present

## 2021-05-23 DIAGNOSIS — Z85828 Personal history of other malignant neoplasm of skin: Secondary | ICD-10-CM | POA: Diagnosis not present

## 2021-05-23 DIAGNOSIS — R238 Other skin changes: Secondary | ICD-10-CM | POA: Diagnosis not present

## 2021-05-23 DIAGNOSIS — L578 Other skin changes due to chronic exposure to nonionizing radiation: Secondary | ICD-10-CM | POA: Diagnosis not present

## 2021-05-23 DIAGNOSIS — D0359 Melanoma in situ of other part of trunk: Secondary | ICD-10-CM | POA: Diagnosis not present

## 2021-05-26 ENCOUNTER — Encounter: Payer: Self-pay | Admitting: Internal Medicine

## 2021-05-26 NOTE — Assessment & Plan Note (Signed)
Low cholesterol diet and exercise as tolerated.  Follow.  

## 2021-05-26 NOTE — Assessment & Plan Note (Signed)
Off tapazole.  On synthroid.  Follow tsh.  

## 2021-05-26 NOTE — Assessment & Plan Note (Signed)
Low carb diet and exercise.  Follow met b and a1c.  ?

## 2021-05-26 NOTE — Assessment & Plan Note (Signed)
Followed by psychiatry 

## 2021-05-26 NOTE — Assessment & Plan Note (Signed)
Was on actonel.  Appears to be off now.  Needs f/u bone density.   ?

## 2021-05-26 NOTE — Assessment & Plan Note (Signed)
Increased stress.  Discussed.  Sees psychiatry.  Overall appears to be doing better.  Follow.  ?

## 2021-05-26 NOTE — Assessment & Plan Note (Signed)
Persistent fatigue.  Check met c and tsh.   ?

## 2021-05-26 NOTE — Assessment & Plan Note (Signed)
Followed by psychiatry.  Continues on cymbalta.  

## 2021-05-28 DIAGNOSIS — F411 Generalized anxiety disorder: Secondary | ICD-10-CM | POA: Diagnosis not present

## 2021-05-28 DIAGNOSIS — R69 Illness, unspecified: Secondary | ICD-10-CM | POA: Diagnosis not present

## 2021-05-31 ENCOUNTER — Encounter: Payer: Self-pay | Admitting: Family Medicine

## 2021-05-31 ENCOUNTER — Telehealth (INDEPENDENT_AMBULATORY_CARE_PROVIDER_SITE_OTHER): Payer: 59 | Admitting: Family Medicine

## 2021-05-31 VITALS — BP 143/92 | HR 60 | Temp 98.6°F | Ht 67.0 in

## 2021-05-31 DIAGNOSIS — L247 Irritant contact dermatitis due to plants, except food: Secondary | ICD-10-CM | POA: Diagnosis not present

## 2021-05-31 MED ORDER — PREDNISONE 20 MG PO TABS
ORAL_TABLET | ORAL | 0 refills | Status: DC
Start: 2021-05-31 — End: 2021-06-11

## 2021-05-31 NOTE — Assessment & Plan Note (Signed)
Acute, worsening ? ?We will treat with prednisone taper and topical over-the-counter hydrocortisone cream.  No signs and symptoms of respiratory distress or compromise.  Patient was given return precautions.  Discussed what to look out for as signs of infected contact dermatitis. ?

## 2021-05-31 NOTE — Progress Notes (Signed)
VIRTUAL VISIT ?A virtual visit is felt to be most appropriate for this patient at this time.  ? ?I connected with the patient on 05/31/21 at 10:20 AM EDT by virtual telehealth platform and verified that I am speaking with the correct person using two identifiers. ?  ?I discussed the limitations, risks, security and privacy concerns of performing an evaluation and management service by  virtual telehealth platform and the availability of in person appointments. I also discussed with the patient that there may be a patient responsible charge related to this service. The patient expressed understanding and agreed to proceed. ? ?Patient location: Home ?Provider Location: [facility] ?Participants: Yurani Fettes Diona Browner and [name] ? ? ?Chief Complaint  ?Patient presents with  ? Rash  ?  Poison Ivy  ? ? ?History of Present Illness: ? 66 year old female patient of Dr. Nicki Reaper present with new onset rash  ? ?After exposure to poison ivy 05/19/21, 1 week later she noted rash on left forearm. ? Now in last week it has spread to upper arm and  breast. Blister rash on red background ? Using Ivarest to dry it up. ? Rash is very itchy, no pain. ? ? No tounge swelling , no SOB. ?No fever. ? ? ? ?COVID 19 screen ?No recent travel or known exposure to [place] ?The patient denies respiratory symptoms of COVID 19 at this time.  The importance of social distancing was discussed today.  ? ?Review of Systems  ?Constitutional:  Negative for chills and fever.  ?HENT:  Negative for congestion and ear pain.   ?Eyes:  Negative for pain and redness.  ?Respiratory:  Negative for cough and shortness of breath.   ?Cardiovascular:  Negative for chest pain, palpitations and leg swelling.  ?Gastrointestinal:  Negative for abdominal pain, blood in stool, constipation, diarrhea, nausea and vomiting.  ?Genitourinary:  Negative for dysuria.  ?Musculoskeletal:  Negative for falls and myalgias.  ?Skin:  Negative for rash.  ?Neurological:  Negative  for dizziness.  ?Psychiatric/Behavioral:  Negative for depression. The patient is not nervous/anxious.    ?  ?Past Medical History:  ?Diagnosis Date  ? Allergies   ? Basal cell carcinoma   ? Fatigue 08/30/2014  ? Generalized anxiety disorder 03/21/2011  ? GERD (gastroesophageal reflux disease)   ? History of COVID-19 02/22/2019  ? HLD (hyperlipidemia)   ? 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 Hypercholesterolemia 02/08/2015  ? Hyperglycemia 08/28/2019  ? Hyperthyroidism   ? Major depressive disorder   ? Osteoporosis 05/13/2015  ? Pyelonephritis 1985  ? Scalp, arm, and leg tingling 08/28/2019  ? Tendinosis 09/12/2013  ? Rotator cuff tendinosis. Mild capsulitis.  ? Tremor 08/28/2019  ? Subtle to mild, upper extremities, left worse than right  ? ? reports that she has never smoked. She has never used smokeless tobacco. She reports that she does not drink alcohol and does not use drugs.  ? ?Current Outpatient Medications:  ?  albuterol (VENTOLIN HFA) 108 (90 Base) MCG/ACT inhaler, TAKE 2 PUFFS BY MOUTH EVERY 6 HOURS AS NEEDED FOR WHEEZE OR SHORTNESS OF BREATH, Disp: 18 each, Rfl: 1 ?  ALPRAZolam (XANAX) 0.5 MG tablet, Take by mouth., Disp: , Rfl:  ?  Calcium Citrate-Vitamin D (CITRACAL + D PO), Take by mouth. Take one in the morning & 2 in the afternoon, Disp: , Rfl:  ?  diclofenac (VOLTAREN) 50 MG EC tablet, Take 50 mg by mouth 2 (two) times daily., Disp: , Rfl:  ?  DULoxetine (CYMBALTA) 20  MG capsule, Take 20 mg by mouth daily., Disp: , Rfl:  ?  DULoxetine (CYMBALTA) 60 MG capsule, Take 60 mg by mouth daily., Disp: , Rfl:  ?  fluticasone (FLONASE) 50 MCG/ACT nasal spray, SPRAY 2 SPRAYS INTO EACH NOSTRIL EVERY DAY, Disp: 16 mL, Rfl: 1 ?  gabapentin (NEURONTIN) 300 MG capsule, Take 300 mg by mouth 3 (three) times daily., Disp: , Rfl:  ?  levothyroxine (SYNTHROID) 75 MCG tablet, TAKE 1 TABLET BY MOUTH EVERY DAY BEFORE BREAKFAST, Disp: 90 tablet, Rfl: 1 ?  Multiple Vitamin (MULTIVITAMIN) tablet, Take 1 tablet by mouth daily., Disp:  , Rfl:  ?  traZODone (DESYREL) 50 MG tablet, 50 mg. Takes 1/2 tab po prn, Disp: , Rfl:  ?  valACYclovir (VALTREX) 500 MG tablet, Take one to two tablets by mouth daily, Disp: , Rfl:  ?  vitamin B-12 (CYANOCOBALAMIN) 1000 MCG tablet, Take 1 tablet (1,000 mcg total) by mouth daily., Disp: , Rfl:   ? ?Observations/Objective: ?Blood pressure (!) 143/92, pulse 60, temperature 98.6 ?F (37 ?C), temperature source Temporal, height '5\' 7"'$  (1.702 m), last menstrual period 01/13/2008. ? ?Physical Exam ?Constitutional:   ?   General: The patient is not in acute distress. ?Pulmonary:  ?   Effort: Pulmonary effort is normal. No respiratory distress.  ?Neurological:  ?   Mental Status: The patient is alert and oriented to person, place, and time.  ?Psychiatric:     ?   Mood and Affect: Mood normal.     ?   Behavior: Behavior normal.  ?Blistering rash on erythematous patches on left lower and upper arm.  Patient reports similar on breast but did not examine on video visit ?Assessment and Plan ?Problem List Items Addressed This Visit   ? ? Contact dermatitis and eczema due to plant - Primary  ?  Acute, worsening ? ?We will treat with prednisone taper and topical over-the-counter hydrocortisone cream.  No signs and symptoms of respiratory distress or compromise.  Patient was given return precautions.  Discussed what to look out for as signs of infected contact dermatitis. ? ?  ?  ? ?Meds ordered this encounter  ?Medications  ? predniSONE (DELTASONE) 20 MG tablet  ?  Sig: 3 tabs by mouth daily x 3 days, then 2 tabs by mouth daily x 2 days then 1 tab by mouth daily x 2 days  ?  Dispense:  15 tablet  ?  Refill:  0  ? ? ?  ?I discussed the assessment and treatment plan with the patient. The patient was provided an opportunity to ask questions and all were answered. The patient agreed with the plan and demonstrated an understanding of the instructions. ?  ?The patient was advised to call back or seek an in-person evaluation if the symptoms  worsen or if the condition fails to improve as anticipated. ? ?  ? ?Eliezer Lofts, MD  ? ?

## 2021-06-08 ENCOUNTER — Encounter: Payer: Self-pay | Admitting: Internal Medicine

## 2021-06-08 DIAGNOSIS — D039 Melanoma in situ, unspecified: Secondary | ICD-10-CM | POA: Insufficient documentation

## 2021-06-11 ENCOUNTER — Telehealth (INDEPENDENT_AMBULATORY_CARE_PROVIDER_SITE_OTHER): Payer: 59 | Admitting: Internal Medicine

## 2021-06-11 ENCOUNTER — Encounter: Payer: Self-pay | Admitting: Internal Medicine

## 2021-06-11 DIAGNOSIS — L247 Irritant contact dermatitis due to plants, except food: Secondary | ICD-10-CM | POA: Diagnosis not present

## 2021-06-11 MED ORDER — TRIAMCINOLONE ACETONIDE 0.1 % EX CREA: 1.0000 "application " | TOPICAL_CREAM | Freq: Two times a day (BID) | CUTANEOUS | 0 refills | Status: AC

## 2021-06-11 MED ORDER — PREDNISONE 10 MG PO TABS: ORAL_TABLET | ORAL | 0 refills | Status: DC

## 2021-06-11 NOTE — Assessment & Plan Note (Signed)
Improved but not resolved on first prednisone taper.  Repeating taper , adding triamcinolone cream ,, and recommending use of non sedating antihistamine bid. ?

## 2021-06-11 NOTE — Progress Notes (Signed)
Virtual Visit via Caregility  Note ? ?This visit type was conducted due to national recommendations for restrictions regarding the COVID-19 pandemic (e.g. social distancing).  This format is felt to be most appropriate for this patient at this time.  All issues noted in this document were discussed and addressed.  No physical exam was performed (except for noted visual exam findings with Video Visits).  ? ?I connected withNAME@ on 06/11/21 at 10:00 AM EDT by a video enabled telemedicine application or telephone and verified that I am speaking with the correct person using two identifiers. ?Location patient: home ?Location provider: work or home office ?Persons participating in the virtual visit: patient, provider ? ?I discussed the limitations, risks, security and privacy concerns of performing an evaluation and management service by telephone and the availability of in person appointments. I also discussed with the patient that there may be a patient responsible charge related to this service. The patient expressed understanding and agreed to proceed. ? ? ?Reason for visit: recurrent contact dermatitis  ? ?HPI: ? ?Treated on May 5 for diffuse contact dermatitis that occurred after working in her yard. She was prescribed  a 7 day taper starting at 60 mg daily. Also used a topical calamine /dipenhydramine lotion.   Rash improved in many areas ,  but new papules popped up  2 days after finishing the taper.  No fevers,  erythema or oozing.   No new contacts.  ? ?ROS: See pertinent positives and negatives per HPI. ? ?Past Medical History:  ?Diagnosis Date  ? Allergies   ? Basal cell carcinoma   ? Fatigue 08/30/2014  ? Generalized anxiety disorder 03/21/2011  ? GERD (gastroesophageal reflux disease)   ? History of COVID-19 02/22/2019  ? HLD (hyperlipidemia)   ? 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 Hypercholesterolemia 02/08/2015  ? Hyperglycemia 08/28/2019  ? Hyperthyroidism   ? Major depressive disorder   ? Osteoporosis  05/13/2015  ? Pyelonephritis 1985  ? Scalp, arm, and leg tingling 08/28/2019  ? Tendinosis 09/12/2013  ? Rotator cuff tendinosis. Mild capsulitis.  ? Tremor 08/28/2019  ? Subtle to mild, upper extremities, left worse than right  ? ? ?Past Surgical History:  ?Procedure Laterality Date  ? DILATION AND CURETTAGE OF UTERUS    ? Miscarriage    ? x1  ? TONSILLECTOMY  1963  ? ? ?Family History  ?Problem Relation Age of Onset  ? Hypertension Mother   ? Hypercholesterolemia Mother   ? Heart disease Father   ? Hyperlipidemia Father   ? Hypercholesterolemia Father   ? Stroke Father   ? Dementia Father   ?     Vascular dementia related to stroke history  ? ? ?SOCIAL HX:  reports that she has never smoked. She has never used smokeless tobacco. She reports that she does not drink alcohol and does not use drugs.  ? ? ?Current Outpatient Medications:  ?  albuterol (VENTOLIN HFA) 108 (90 Base) MCG/ACT inhaler, TAKE 2 PUFFS BY MOUTH EVERY 6 HOURS AS NEEDED FOR WHEEZE OR SHORTNESS OF BREATH, Disp: 18 each, Rfl: 1 ?  ALPRAZolam (XANAX) 0.5 MG tablet, Take by mouth., Disp: , Rfl:  ?  Calcium Citrate-Vitamin D (CITRACAL + D PO), Take by mouth. Take one in the morning & 2 in the afternoon, Disp: , Rfl:  ?  diclofenac (VOLTAREN) 50 MG EC tablet, Take 50 mg by mouth 2 (two) times daily., Disp: , Rfl:  ?  DULoxetine (CYMBALTA) 20 MG capsule, Take 20 mg by  mouth daily., Disp: , Rfl:  ?  DULoxetine (CYMBALTA) 60 MG capsule, Take 60 mg by mouth daily., Disp: , Rfl:  ?  fluticasone (FLONASE) 50 MCG/ACT nasal spray, SPRAY 2 SPRAYS INTO EACH NOSTRIL EVERY DAY, Disp: 16 mL, Rfl: 1 ?  gabapentin (NEURONTIN) 300 MG capsule, Take 300 mg by mouth 3 (three) times daily., Disp: , Rfl:  ?  levothyroxine (SYNTHROID) 75 MCG tablet, TAKE 1 TABLET BY MOUTH EVERY DAY BEFORE BREAKFAST, Disp: 90 tablet, Rfl: 1 ?  Multiple Vitamin (MULTIVITAMIN) tablet, Take 1 tablet by mouth daily., Disp: , Rfl:  ?  predniSONE (DELTASONE) 10 MG tablet, 6 tablets daily for 3  days, then reduce by 1 tablet daily until gone, Disp: 33 tablet, Rfl: 0 ?  traZODone (DESYREL) 50 MG tablet, 50 mg. Takes 1/2 tab po prn, Disp: , Rfl:  ?  triamcinolone cream (KENALOG) 0.1 %, Apply 1 application. topically 2 (two) times daily. Until rash resolves, Disp: 80 g, Rfl: 0 ?  valACYclovir (VALTREX) 500 MG tablet, Take one to two tablets by mouth daily, Disp: , Rfl:  ?  vitamin B-12 (CYANOCOBALAMIN) 1000 MCG tablet, Take 1 tablet (1,000 mcg total) by mouth daily., Disp: , Rfl:  ? ?EXAM: ? ?VITALS per patient if applicable: ? ?GENERAL: alert, oriented, appears well and in no acute distress ? ?HEENT: atraumatic, conjunttiva clear, no obvious abnormalities on inspection of external nose and ears ? ?NECK: normal movements of the head and neck ? ?LUNGS: on inspection no signs of respiratory distress, breathing rate appears normal, no obvious gross SOB, gasping or wheezing ? ?CV: no obvious cyanosis ?Skin:  multiple areas of papular linear rash  on breast ,  thigh and forearms. .  No erythema  ? ?MS: moves all visible extremities without noticeable abnormality ? ?PSYCH/NEURO: pleasant and cooperative, no obvious depression or anxiety, speech and thought processing grossly intact ? ?ASSESSMENT AND PLAN: ? ?Discussed the following assessment and plan: ? ?Contact dermatitis and eczema due to plant ? ?Contact dermatitis and eczema due to plant ?Improved but not resolved on first prednisone taper.  Repeating taper , adding triamcinolone cream ,, and recommending use of non sedating antihistamine bid. ? ?  ?I discussed the assessment and treatment plan with the patient. The patient was provided an opportunity to ask questions and all were answered. The patient agreed with the plan and demonstrated an understanding of the instructions. ?  ?The patient was advised to call back or seek an in-person evaluation if the symptoms worsen or if the condition fails to improve as anticipated. ? ? ?I spent n20 minutes dedicated to  the care of this patient on the date of this encounter to include pre-visit review of her medical history,  Face-to-face time with the patient , and post visit ordering of testing and therapeutics.  ? ? ?Crecencio Mc, MD   ?

## 2021-06-13 DIAGNOSIS — Z1283 Encounter for screening for malignant neoplasm of skin: Secondary | ICD-10-CM | POA: Diagnosis not present

## 2021-06-13 DIAGNOSIS — L812 Freckles: Secondary | ICD-10-CM | POA: Diagnosis not present

## 2021-06-13 DIAGNOSIS — D0359 Melanoma in situ of other part of trunk: Secondary | ICD-10-CM | POA: Diagnosis not present

## 2021-06-13 DIAGNOSIS — L111 Transient acantholytic dermatosis [Grover]: Secondary | ICD-10-CM | POA: Diagnosis not present

## 2021-06-13 DIAGNOSIS — D225 Melanocytic nevi of trunk: Secondary | ICD-10-CM | POA: Diagnosis not present

## 2021-06-13 DIAGNOSIS — L578 Other skin changes due to chronic exposure to nonionizing radiation: Secondary | ICD-10-CM | POA: Diagnosis not present

## 2021-06-13 DIAGNOSIS — D485 Neoplasm of uncertain behavior of skin: Secondary | ICD-10-CM | POA: Diagnosis not present

## 2021-06-13 DIAGNOSIS — L821 Other seborrheic keratosis: Secondary | ICD-10-CM | POA: Diagnosis not present

## 2021-06-13 DIAGNOSIS — D1801 Hemangioma of skin and subcutaneous tissue: Secondary | ICD-10-CM | POA: Diagnosis not present

## 2021-06-13 DIAGNOSIS — Z85828 Personal history of other malignant neoplasm of skin: Secondary | ICD-10-CM | POA: Diagnosis not present

## 2021-06-13 DIAGNOSIS — R238 Other skin changes: Secondary | ICD-10-CM | POA: Diagnosis not present

## 2021-06-13 DIAGNOSIS — L237 Allergic contact dermatitis due to plants, except food: Secondary | ICD-10-CM | POA: Diagnosis not present

## 2021-06-17 ENCOUNTER — Other Ambulatory Visit: Payer: 59

## 2021-06-17 ENCOUNTER — Other Ambulatory Visit (INDEPENDENT_AMBULATORY_CARE_PROVIDER_SITE_OTHER): Payer: 59

## 2021-06-17 DIAGNOSIS — R748 Abnormal levels of other serum enzymes: Secondary | ICD-10-CM

## 2021-06-17 LAB — HEPATIC FUNCTION PANEL
ALT: 25 U/L (ref 0–35)
AST: 17 U/L (ref 0–37)
Albumin: 4.6 g/dL (ref 3.5–5.2)
Alkaline Phosphatase: 92 U/L (ref 39–117)
Bilirubin, Direct: 0.1 mg/dL (ref 0.0–0.3)
Total Bilirubin: 0.6 mg/dL (ref 0.2–1.2)
Total Protein: 6.7 g/dL (ref 6.0–8.3)

## 2021-06-20 DIAGNOSIS — U099 Post covid-19 condition, unspecified: Secondary | ICD-10-CM | POA: Diagnosis not present

## 2021-06-20 DIAGNOSIS — M797 Fibromyalgia: Secondary | ICD-10-CM | POA: Diagnosis not present

## 2021-06-20 DIAGNOSIS — R5382 Chronic fatigue, unspecified: Secondary | ICD-10-CM | POA: Diagnosis not present

## 2021-06-21 ENCOUNTER — Encounter: Payer: Self-pay | Admitting: Internal Medicine

## 2021-06-21 ENCOUNTER — Telehealth: Payer: 59 | Admitting: Emergency Medicine

## 2021-06-21 DIAGNOSIS — L259 Unspecified contact dermatitis, unspecified cause: Secondary | ICD-10-CM

## 2021-06-21 MED ORDER — PREDNISONE 10 MG PO TABS: ORAL_TABLET | ORAL | 0 refills | Status: DC

## 2021-06-21 NOTE — Telephone Encounter (Signed)
Pt called stating she need more medication because the poison ivy is back

## 2021-06-21 NOTE — Progress Notes (Signed)
Virtual Visit Consent   Amber Richardson, you are scheduled for a virtual visit with a [facility] provider today. Just as with appointments in the office, your consent must be obtained to participate. Your consent will be active for this visit and any virtual visit you may have with one of our providers in the next 365 days. If you have a MyChart account, a copy of this consent can be sent to you electronically.  As this is a virtual visit, video technology does not allow for your provider to perform a traditional examination. This may limit your provider's ability to fully assess your condition. If your provider identifies any concerns that need to be evaluated in person or the need to arrange testing (such as labs, EKG, etc.), we will make arrangements to do so. Although advances in technology are sophisticated, we cannot ensure that it will always work on either your end or our end. If the connection with a video visit is poor, the visit may have to be switched to a telephone visit. With either a video or telephone visit, we are not always able to ensure that we have a secure connection.  By engaging in this virtual visit, you consent to the provision of healthcare and authorize for your insurance to be billed (if applicable) for the services provided during this visit. Depending on your insurance coverage, you may receive a charge related to this service.  I need to obtain your verbal consent now. Are you willing to proceed with your visit today? Amber Richardson has provided verbal consent on 06/21/2021 for a virtual visit (video or telephone). Carvel Getting, NP  Date: 06/21/2021 11:59 AM  Virtual Visit via Video Note   I, Carvel Getting, connected with  Amber Richardson  ([id number], [birth year]) on 06/21/21 at 11:30 AM EDT by a video-enabled telemedicine application and verified that I am speaking with the correct person using two identifiers.  Location: Patient: Virtual Visit Location Patient:  Home Provider: Virtual Visit Location Provider: Home Office   I discussed the limitations of evaluation and management by telemedicine and the availability of in person appointments. The patient expressed understanding and agreed to proceed.    History of Present Illness: Amber Richardson is a 66 y.o. who identifies as a female who was assigned female at birth, and is being seen today for poison ivy dermatitis.  In late April, patient had poison ivy dermatitis.  Her usual topical OTC treatments did not resolve the issue.  She had a video visit with a healthcare provider who prescribed her 7 days of prednisone.  At the end of that 7-day course of prednisone, her lesions were mostly improved but not completely gone and after she stopped the prednisone they spread again.  She had another video visit 06/11/2021 with another provider who placed her on a longer prednisone taper.  She completed that longer taper 3 days ago.  At that time, she had 0 poison ivy lesions left.  Her skin was clear.  Last night, she developed more of the same vesicular lesions and itching as previous poison ivy dermatitis episodes.  However, patient reports she has not come in contact with poison ivy since completing the prednisone taper 3 days ago.  She reports lesions look exactly the same and feel exactly the same as her prior poison ivy dermatitis.  She does report that she may not have changed her sheets and the best timeframe after her lesions resolved while still on prednisone.  She also  reports she may have come in contact with other surfaces in her home and car that may have some of the oil from poison ivy on it that could have caused her reexposure. Denies pain. Only symptoms are rash and itching. Rash is on shoulder and near buttocks/gluteal fold on back of legs.    HPI: HPI  Problems:  Patient Active Problem List   Diagnosis Date Noted   Melanoma in situ ([place]) 06/08/2021   Contact dermatitis and eczema due to plant  05/31/2021   Sinusitis 01/23/2021   Healthcare maintenance 11/11/2020   Rib pain on right side 09/30/2020   Joint pain 09/19/2020   Chest congestion 06/17/2020   Joint stiffness 06/17/2020   Change in vision 06/17/2020   Unsteady gait 06/17/2020   Moderate episode of recurrent major depressive disorder ([place]) 06/17/2020   Muscle weakness 06/13/2020   Stress 11/14/2019   Breast cancer screening 11/14/2019   Major depressive disorder    Disorder of bursae of shoulder region 09/01/2019   Knee pain 09/01/2019   Low back pain 08/67/6195   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   Scalp, arm, and leg tingling 08/28/2019   Tremor 08/28/2019   Hyperglycemia 08/28/2019   History of COVID-19 02/20/2019   Left arm pain 01/09/2019   Neck pain 09/04/2018   Dizziness 12/19/2017   Dyspnea 09/13/2017   Left facial numbness 06/14/2015   Osteoporosis 05/13/2015   Hypercholesterolemia 02/08/2015   Left foot pain 11/04/2014   Atrophic vaginitis 09/20/2014   HSV infection 09/20/2014   Pelvic pain in female 09/20/2014   Fatigue 08/30/2014   Right calf pain 08/30/2014   Chest pain 08/30/2014   Abdominal pain 08/29/2014   Tendinosis 09/12/2013   Basal cell carcinoma 03/17/2012   Hyperthyroidism 03/17/2012   Fibroids 01/19/2012   BV (bacterial vaginosis) 01/05/2012   PMB (postmenopausal bleeding) 01/05/2012   Generalized anxiety disorder 03/21/2011   GERD (gastroesophageal reflux disease) 03/21/2011    Allergies:  Allergies  Allergen Reactions   Levaquin [Levofloxacin In D5w] Other (See Comments)    Numbness and tingling   Crestor [Rosuvastatin]    Sertraline Other (See Comments)    Trimmers    Sulfa Antibiotics Rash   Medications:  Current Outpatient Medications:    albuterol (VENTOLIN HFA) 108 (90 Base) MCG/ACT inhaler, TAKE 2 PUFFS BY MOUTH EVERY 6 HOURS AS NEEDED FOR WHEEZE OR SHORTNESS OF BREATH, Disp: 18 each, Rfl: 1   ALPRAZolam (XANAX) 0.5 MG tablet, Take by mouth., Disp: , Rfl:     Calcium Citrate-Vitamin D (CITRACAL + D PO), Take by mouth. Take one in the morning & 2 in the afternoon, Disp: , Rfl:    diclofenac (VOLTAREN) 50 MG EC tablet, Take 50 mg by mouth 2 (two) times daily., Disp: , Rfl:    DULoxetine (CYMBALTA) 20 MG capsule, Take 20 mg by mouth daily., Disp: , Rfl:    DULoxetine (CYMBALTA) 60 MG capsule, Take 60 mg by mouth daily., Disp: , Rfl:    fluticasone (FLONASE) 50 MCG/ACT nasal spray, SPRAY 2 SPRAYS INTO EACH NOSTRIL EVERY DAY, Disp: 16 mL, Rfl: 1   gabapentin (NEURONTIN) 300 MG capsule, Take 300 mg by mouth 3 (three) times daily., Disp: , Rfl:    levothyroxine (SYNTHROID) 75 MCG tablet, TAKE 1 TABLET BY MOUTH EVERY DAY BEFORE BREAKFAST, Disp: 90 tablet, Rfl: 1   Multiple Vitamin (MULTIVITAMIN) tablet, Take 1 tablet by mouth daily., Disp: , Rfl:    predniSONE (DELTASONE) 10 MG tablet, 6 tablets daily for 3 days,  then reduce by 1 tablet daily until gone, Disp: 33 tablet, Rfl: 0   traZODone (DESYREL) 50 MG tablet, 50 mg. Takes 1/2 tab po prn, Disp: , Rfl:    triamcinolone cream (KENALOG) 0.1 %, Apply 1 application. topically 2 (two) times daily. Until rash resolves, Disp: 80 g, Rfl: 0   valACYclovir (VALTREX) 500 MG tablet, Take one to two tablets by mouth daily, Disp: , Rfl:    vitamin B-12 (CYANOCOBALAMIN) 1000 MCG tablet, Take 1 tablet (1,000 mcg total) by mouth daily., Disp: , Rfl:   Observations/Objective: Patient is well-developed, well-nourished in no acute distress.  Resting comfortably  at home.  Head is normocephalic, atraumatic.  No labored breathing.  Speech is clear and coherent with logical content.  Patient is alert and oriented at baseline.    Assessment and Plan: 1. Contact dermatitis, unspecified contact dermatitis type, unspecified trigger  Will reorder longer prednisone taper. Pt to f/u with dermatology and work on clean home/car to remove poison ivy oils from surfaces.   Follow Up Instructions: I discussed the assessment and  treatment plan with the patient. The patient was provided an opportunity to ask questions and all were answered. The patient agreed with the plan and demonstrated an understanding of the instructions.  A copy of instructions were sent to the patient via MyChart unless otherwise noted below.    The patient was advised to call back or seek an in-person evaluation if the symptoms worsen or if the condition fails to improve as anticipated.  Time:  I spent 15 minutes with the patient via telehealth technology discussing the above problems/concerns.    Carvel Getting, NP

## 2021-06-21 NOTE — Patient Instructions (Signed)
[name], thank you for joining Carvel Getting, NP for today's virtual visit.  While this provider is not your primary care provider (PCP), if your PCP is located in our provider database this encounter information will be shared with them immediately following your visit.  Consent: (Patient) [name] provided verbal consent for this virtual visit at the beginning of the encounter.  Current Medications:  Current Outpatient Medications:    albuterol (VENTOLIN HFA) 108 (90 Base) MCG/ACT inhaler, TAKE 2 PUFFS BY MOUTH EVERY 6 HOURS AS NEEDED FOR WHEEZE OR SHORTNESS OF BREATH, Disp: 18 each, Rfl: 1   ALPRAZolam (XANAX) 0.5 MG tablet, Take by mouth., Disp: , Rfl:    Calcium Citrate-Vitamin D (CITRACAL + D PO), Take by mouth. Take one in the morning & 2 in the afternoon, Disp: , Rfl:    diclofenac (VOLTAREN) 50 MG EC tablet, Take 50 mg by mouth 2 (two) times daily., Disp: , Rfl:    DULoxetine (CYMBALTA) 20 MG capsule, Take 20 mg by mouth daily., Disp: , Rfl:    DULoxetine (CYMBALTA) 60 MG capsule, Take 60 mg by mouth daily., Disp: , Rfl:    fluticasone (FLONASE) 50 MCG/ACT nasal spray, SPRAY 2 SPRAYS INTO EACH NOSTRIL EVERY DAY, Disp: 16 mL, Rfl: 1   gabapentin (NEURONTIN) 300 MG capsule, Take 300 mg by mouth 3 (three) times daily., Disp: , Rfl:    levothyroxine (SYNTHROID) 75 MCG tablet, TAKE 1 TABLET BY MOUTH EVERY DAY BEFORE BREAKFAST, Disp: 90 tablet, Rfl: 1   Multiple Vitamin (MULTIVITAMIN) tablet, Take 1 tablet by mouth daily., Disp: , Rfl:    predniSONE (DELTASONE) 10 MG tablet, 6 tablets daily for 3 days, then reduce by 1 tablet daily until gone, Disp: 33 tablet, Rfl: 0   traZODone (DESYREL) 50 MG tablet, 50 mg. Takes 1/2 tab po prn, Disp: , Rfl:    triamcinolone cream (KENALOG) 0.1 %, Apply 1 application. topically 2 (two) times daily. Until rash resolves, Disp: 80 g, Rfl: 0   valACYclovir (VALTREX) 500 MG tablet, Take one to two tablets by mouth daily, Disp: , Rfl:     vitamin B-12 (CYANOCOBALAMIN) 1000 MCG tablet, Take 1 tablet (1,000 mcg total) by mouth daily., Disp: , Rfl:    Medications ordered in this encounter:  Meds ordered this encounter  Medications   predniSONE (DELTASONE) 10 MG tablet    Sig: 6 tablets daily for 3 days, then reduce by 1 tablet daily until gone    Dispense:  33 tablet    Refill:  0     *If you need refills on other medications prior to your next appointment, please contact your pharmacy*  Follow-Up: Call back or seek an in-person evaluation if the symptoms worsen or if the condition fails to improve as anticipated.  Other Instructions Follow up with your dermatologist about making a plan for your next exposure to poison ivy.   Wash your sheets after lesions are healing, no longer vesicular. Wash any surfaces that could be contaminated with poison ivy oil.    If you have been instructed to have an in-person evaluation today at a local Urgent Care facility, please use the link below. It will take you to a list of all of our available [facility], including address, phone number and hours of operation. Please do not delay care.  [facility]  If you or a family member do not have a primary care provider, use the link below to schedule a  visit and establish care. When you choose a [facility] primary care physician or advanced practice provider, you gain a long-term partner in health. Find a Primary Care Provider  Learn more about [facility] in-office and virtual care options: [facility]

## 2021-06-26 DIAGNOSIS — D0359 Melanoma in situ of other part of trunk: Secondary | ICD-10-CM | POA: Diagnosis not present

## 2021-06-28 DIAGNOSIS — Z0100 Encounter for examination of eyes and vision without abnormal findings: Secondary | ICD-10-CM | POA: Diagnosis not present

## 2021-07-02 DIAGNOSIS — R69 Illness, unspecified: Secondary | ICD-10-CM | POA: Diagnosis not present

## 2021-07-02 DIAGNOSIS — F330 Major depressive disorder, recurrent, mild: Secondary | ICD-10-CM | POA: Diagnosis not present

## 2021-07-10 ENCOUNTER — Other Ambulatory Visit: Payer: Self-pay

## 2021-07-10 ENCOUNTER — Telehealth: Payer: Self-pay | Admitting: Internal Medicine

## 2021-07-10 MED ORDER — LEVOTHYROXINE SODIUM 75 MCG PO TABS: ORAL_TABLET | ORAL | 1 refills | Status: DC

## 2021-07-10 NOTE — Telephone Encounter (Signed)
sent 

## 2021-07-10 NOTE — Telephone Encounter (Signed)
Patient called and is out of her levothyroxine (SYNTHROID) 75 MCG tablet. Please refill.

## 2021-09-19 ENCOUNTER — Ambulatory Visit (INDEPENDENT_AMBULATORY_CARE_PROVIDER_SITE_OTHER): Payer: No Typology Code available for payment source

## 2021-09-19 ENCOUNTER — Ambulatory Visit (INDEPENDENT_AMBULATORY_CARE_PROVIDER_SITE_OTHER): Payer: No Typology Code available for payment source | Admitting: Internal Medicine

## 2021-09-19 ENCOUNTER — Encounter: Payer: Self-pay | Admitting: Internal Medicine

## 2021-09-19 VITALS — BP 132/86 | HR 60 | Temp 98.5°F | Resp 17 | Ht 67.0 in | Wt 161.6 lb

## 2021-09-19 DIAGNOSIS — F331 Major depressive disorder, recurrent, moderate: Secondary | ICD-10-CM

## 2021-09-19 DIAGNOSIS — E0590 Thyrotoxicosis, unspecified without thyrotoxic crisis or storm: Secondary | ICD-10-CM | POA: Diagnosis not present

## 2021-09-19 DIAGNOSIS — R739 Hyperglycemia, unspecified: Secondary | ICD-10-CM

## 2021-09-19 DIAGNOSIS — N289 Disorder of kidney and ureter, unspecified: Secondary | ICD-10-CM

## 2021-09-19 DIAGNOSIS — F411 Generalized anxiety disorder: Secondary | ICD-10-CM

## 2021-09-19 DIAGNOSIS — Z8616 Personal history of COVID-19: Secondary | ICD-10-CM

## 2021-09-19 DIAGNOSIS — D039 Melanoma in situ, unspecified: Secondary | ICD-10-CM

## 2021-09-19 DIAGNOSIS — Z20822 Contact with and (suspected) exposure to covid-19: Secondary | ICD-10-CM

## 2021-09-19 DIAGNOSIS — R053 Chronic cough: Secondary | ICD-10-CM | POA: Diagnosis not present

## 2021-09-19 DIAGNOSIS — E7800 Pure hypercholesterolemia, unspecified: Secondary | ICD-10-CM | POA: Diagnosis not present

## 2021-09-19 DIAGNOSIS — N898 Other specified noninflammatory disorders of vagina: Secondary | ICD-10-CM

## 2021-09-19 DIAGNOSIS — F439 Reaction to severe stress, unspecified: Secondary | ICD-10-CM

## 2021-09-19 DIAGNOSIS — M810 Age-related osteoporosis without current pathological fracture: Secondary | ICD-10-CM

## 2021-09-19 LAB — HEPATIC FUNCTION PANEL
ALT: 22 U/L (ref 0–35)
AST: 22 U/L (ref 0–37)
Albumin: 4.5 g/dL (ref 3.5–5.2)
Alkaline Phosphatase: 95 U/L (ref 39–117)
Bilirubin, Direct: 0.1 mg/dL (ref 0.0–0.3)
Total Bilirubin: 0.7 mg/dL (ref 0.2–1.2)
Total Protein: 6.6 g/dL (ref 6.0–8.3)

## 2021-09-19 LAB — LIPID PANEL
Cholesterol: 298 mg/dL — ABNORMAL HIGH (ref 0–200)
HDL: 61.5 mg/dL (ref >39.00)
LDL Cholesterol: 200 mg/dL — ABNORMAL HIGH (ref 0–99)
NonHDL: 236.41
Total CHOL/HDL Ratio: 5
Triglycerides: 181 mg/dL — ABNORMAL HIGH (ref 0.0–149.0)
VLDL: 36.2 mg/dL (ref 0.0–40.0)

## 2021-09-19 LAB — HEMOGLOBIN A1C: Hgb A1c MFr Bld: 6.1 % (ref 4.6–6.5)

## 2021-09-19 LAB — BASIC METABOLIC PANEL
BUN: 21 mg/dL (ref 6–23)
CO2: 29 mEq/L (ref 19–32)
Calcium: 9.2 mg/dL (ref 8.4–10.5)
Chloride: 104 mEq/L (ref 96–112)
Creatinine, Ser: 0.91 mg/dL (ref 0.40–1.20)
GFR: 67.06 mL/min (ref >60.00)
Glucose, Bld: 87 mg/dL (ref 70–99)
Potassium: 4.6 mEq/L (ref 3.5–5.1)
Sodium: 140 mEq/L (ref 135–145)

## 2021-09-19 LAB — TSH: TSH: 3.11 u[IU]/mL (ref 0.35–5.50)

## 2021-09-19 MED ORDER — ALBUTEROL SULFATE HFA 108 (90 BASE) MCG/ACT IN AERS
INHALATION_SPRAY | RESPIRATORY_TRACT | 1 refills | Status: DC
Start: 2021-09-19 — End: 2022-11-07

## 2021-09-19 MED ORDER — AMOXICILLIN-POT CLAVULANATE 875-125 MG PO TABS: 1.0000 | ORAL_TABLET | Freq: Two times a day (BID) | ORAL | 0 refills | Status: DC

## 2021-09-19 MED ORDER — FLUCONAZOLE 150 MG PO TABS: ORAL_TABLET | ORAL | 3 refills | Status: DC

## 2021-09-19 MED ORDER — MUPIROCIN 2 % EX OINT: 1.0000 | TOPICAL_OINTMENT | Freq: Two times a day (BID) | CUTANEOUS | 0 refills | Status: DC

## 2021-09-19 MED ORDER — FLUCONAZOLE 150 MG PO TABS
ORAL_TABLET | ORAL | 0 refills | Status: DC
Start: 2021-09-19 — End: 2021-11-14

## 2021-09-19 NOTE — Progress Notes (Signed)
Patient ID: Brightyn Mozer, female   DOB: [birth year], 66 y.o.   MRN: [id number]   Subjective:    Patient ID: [name], female    DOB: January 02, 1956, 66 y.o.   MRN: [id number]   Patient here for a scheduled follow up .   HPI Was evaluated 09/06/21 - diagnosed with sinusitis and cough.  Treated with augmentin.  Symptoms have improved.  Still with nasal stuffiness.  Some increased drainage with some associated cough.  Clear mucus.  No fever.  Sore throat is better.  No chest tightness or wheezing.  Does report some minimal sob - associated with congestion.  Using inhaler.  Does report some vaginal irritation after taking abx.  Saw rheumatology 06/20/21 - long covid, fibromyalgia and CFS.  Recommended to continue diclofenac, duloxetine and gabapentin.     Past Medical History:  Diagnosis Date   Allergies    Basal cell carcinoma    Fatigue 08/30/2014   Generalized anxiety disorder 03/21/2011   GERD (gastroesophageal reflux disease)    History of COVID-19 02/22/2019   HLD (hyperlipidemia)    HSV infection 09/20/2014   Hypercholesterolemia 02/08/2015   Hyperglycemia 08/28/2019   Hyperthyroidism    Major depressive disorder    Osteoporosis 05/13/2015   Pyelonephritis 1985   Scalp, arm, and leg tingling 08/28/2019   Tendinosis 09/12/2013   Rotator cuff tendinosis. Mild capsulitis.   Tremor 08/28/2019   Subtle to mild, upper extremities, left worse than right   Past Surgical History:  Procedure Laterality Date   DILATION AND CURETTAGE OF UTERUS     Miscarriage     x1   TONSILLECTOMY  1963   Family History  Problem Relation Age of Onset   Hypertension Mother    Hypercholesterolemia Mother    Heart disease Father    Hyperlipidemia Father    Hypercholesterolemia Father    Stroke Father    Dementia Father        Vascular dementia related to stroke history   Social History   Socioeconomic History   Marital status: Married    Spouse name: Not on file   Number of children:  0   Years of education: 16   Highest education level: Bachelor's degree (e.g., BA, AB, BS)  Occupational History   Not on file  Tobacco Use   Smoking status: Never   Smokeless tobacco: Never  Vaping Use   Vaping Use: Never used  Substance and Sexual Activity   Alcohol use: No    Alcohol/week: 0.0 standard drinks of alcohol   Drug use: No   Sexual activity: Not on file  Other Topics Concern   Not on file  Social History Narrative   Right handed   Drinks one cup coffee am and one pepsi   Social Determinants of Health   Financial Resource Strain: Not on file  Food Insecurity: Not on file  Transportation Needs: Not on file  Physical Activity: Not on file  Stress: Not on file  Social Connections: Not on file     Review of Systems  Constitutional:  Negative for appetite change and fever.  HENT:  Positive for congestion and postnasal drip.   Respiratory:  Positive for cough. Negative for chest tightness.        Some sob as outlined.   Cardiovascular:  Negative for chest pain, palpitations and leg swelling.  Gastrointestinal:  Negative for abdominal pain, diarrhea, nausea and vomiting.  Genitourinary:  Positive for dysuria. Negative for difficulty urinating.  Musculoskeletal:  Negative for  joint swelling and myalgias.  Skin:  Negative for color change and rash.  Neurological:  Negative for dizziness, light-headedness and headaches.  Psychiatric/Behavioral:  Negative for agitation and dysphoric mood.        Objective:     BP 132/86 (BP Location: Left Arm, Patient Position: Sitting, Cuff Size: Small)   Pulse 60   Temp 98.5 F (36.9 C) (Temporal)   Resp 17   Ht '5\' 7"'  (1.702 m)   Wt 161 lb 9.6 oz (73.3 kg)   LMP 01/13/2008   SpO2 97%   BMI 25.31 kg/m  Wt Readings from Last 3 Encounters:  09/19/21 161 lb 9.6 oz (73.3 kg)  06/11/21 165 lb (74.8 kg)  05/20/21 165 lb (74.8 kg)    Physical Exam Vitals reviewed.  Constitutional:      General: She is not in acute  distress.    Appearance: Normal appearance.  HENT:     Head: Normocephalic and atraumatic.     Right Ear: External ear normal.     Left Ear: External ear normal.  Eyes:     General: No scleral icterus.       Right eye: No discharge.        Left eye: No discharge.     Conjunctiva/sclera: Conjunctivae normal.  Neck:     Thyroid: No thyromegaly.  Cardiovascular:     Rate and Rhythm: Normal rate and regular rhythm.  Pulmonary:     Effort: No respiratory distress.     Breath sounds: Normal breath sounds. No wheezing.  Abdominal:     General: Bowel sounds are normal.     Palpations: Abdomen is soft.     Tenderness: There is no abdominal tenderness.  Musculoskeletal:        General: No swelling or tenderness.     Cervical back: Neck supple. No tenderness.  Lymphadenopathy:     Cervical: No cervical adenopathy.  Skin:    Findings: No erythema or rash.  Neurological:     Mental Status: She is alert.  Psychiatric:        Mood and Affect: Mood normal.        Behavior: Behavior normal.      Outpatient Encounter Medications as of 09/19/2021  Medication Sig   ALPRAZolam (XANAX) 0.5 MG tablet Take by mouth.   amoxicillin-clavulanate (AUGMENTIN) 875-125 MG tablet Take 1 tablet by mouth 2 (two) times daily.   Calcium Citrate-Vitamin D (CITRACAL + D PO) Take by mouth. Take one in the morning & 2 in the afternoon   diclofenac (VOLTAREN) 50 MG EC tablet Take 50 mg by mouth 2 (two) times daily.   DULoxetine (CYMBALTA) 20 MG capsule Take 20 mg by mouth daily.   DULoxetine (CYMBALTA) 60 MG capsule Take 60 mg by mouth daily.   fluticasone (FLONASE) 50 MCG/ACT nasal spray SPRAY 2 SPRAYS INTO EACH NOSTRIL EVERY DAY   gabapentin (NEURONTIN) 300 MG capsule Take 300 mg by mouth 3 (three) times daily.   levothyroxine (SYNTHROID) 75 MCG tablet TAKE 1 TABLET BY MOUTH EVERY DAY BEFORE BREAKFAST   Multiple Vitamin (MULTIVITAMIN) tablet Take 1 tablet by mouth daily.   mupirocin ointment (BACTROBAN) 2 %  Apply 1 Application topically 2 (two) times daily.   predniSONE (DELTASONE) 10 MG tablet 6 tablets daily for 3 days, then reduce by 1 tablet daily until gone   traZODone (DESYREL) 50 MG tablet 50 mg. Takes 1/2 tab po prn   triamcinolone cream (KENALOG) 0.1 % Apply 1 application. topically  2 (two) times daily. Until rash resolves   valACYclovir (VALTREX) 500 MG tablet Take one to two tablets by mouth daily   vitamin B-12 (CYANOCOBALAMIN) 1000 MCG tablet Take 1 tablet (1,000 mcg total) by mouth daily.   [DISCONTINUED] albuterol (VENTOLIN HFA) 108 (90 Base) MCG/ACT inhaler TAKE 2 PUFFS BY MOUTH EVERY 6 HOURS AS NEEDED FOR WHEEZE OR SHORTNESS OF BREATH   [DISCONTINUED] fluconazole (DIFLUCAN) 150 MG tablet 2 sprays each nostril one time per day.  Do this in the evening.   albuterol (VENTOLIN HFA) 108 (90 Base) MCG/ACT inhaler TAKE 2 PUFFS BY MOUTH EVERY 6 HOURS AS NEEDED FOR WHEEZE OR SHORTNESS OF BREATH   fluconazole (DIFLUCAN) 150 MG tablet Take one tablet x 1 day and then if persistent symptoms may repeat x 1 after three days.   No facility-administered encounter medications on file as of 09/19/2021.     Lab Results  Component Value Date   WBC 5.0 02/15/2021   HGB 13.5 02/15/2021   HCT 41.7 02/15/2021   PLT 228.0 02/15/2021   GLUCOSE 87 09/19/2021   CHOL 298 (H) 09/19/2021   TRIG 181.0 (H) 09/19/2021   HDL 61.50 09/19/2021   LDLDIRECT 165.6 03/23/2012   LDLCALC 200 (H) 09/19/2021   ALT 22 09/19/2021   AST 22 09/19/2021   NA 140 09/19/2021   K 4.6 09/19/2021   CL 104 09/19/2021   CREATININE 0.91 09/19/2021   BUN 21 09/19/2021   CO2 29 09/19/2021   TSH 3.11 09/19/2021   HGBA1C 6.1 09/19/2021    US Abdomen Complete  Result Date: 10/09/2020 CLINICAL DATA:  Right-sided and right upper quadrant abdominal pain for 6 weeks EXAM: ABDOMEN ULTRASOUND COMPLETE COMPARISON:  Abdominal ultrasound 09/06/2014, CT abdomen/pelvis 12/16/2012 FINDINGS: Gallbladder: No gallstones or wall thickening  visualized. No sonographic Murphy sign noted by sonographer. Common bile duct: Diameter: 4 mm Liver: No focal lesion identified. Within normal limits in parenchymal echogenicity. Portal vein is patent on color Doppler imaging with normal direction of blood flow towards the liver. IVC: No abnormality visualized. Pancreas: Visualized portion unremarkable. Spleen: Size and appearance within normal limits. Right Kidney: Length: 10.5 cm. Echogenicity within normal limits. No mass or hydronephrosis visualized. Left Kidney: Length: 10.6 cm. Echogenicity within normal limits. No mass or hydronephrosis visualized. Abdominal aorta: No aneurysm visualized. Other findings: None. IMPRESSION: Normal abdominal ultrasound. Electronically Signed   By: Valetta Mole M.D.   On: 10/09/2020 13:24       Assessment & Plan:   Problem List Items Addressed This Visit     Generalized anxiety disorder    Followed by psychiatry.       History of COVID-19    Since covid has had multiple residual symptoms/problems.  Initially had persistent sob and DOE.  Had extensive evaluation and w/up by pulmonary.  Breathing has been better overall.  Had described persistent problems with memory, tremors, paresthesias and trouble concentrating and focusing.  MRI normal.  NCS and EMG normal.  Saw neurology.  No clear neurological etiology found.  She is seeing psychiatry - treating anxiety and depression.  Now with increased joint stiffness and pain.  Diagnosed with OA.  On diclofenac and gabapentin.  Recent follow up- per note - they discussed long covid/fibromyalgia.  Recommended continuing cymbalta, diclofenac and gabapentin.        Hypercholesterolemia - Primary    Low cholesterol diet and exercise as tolerated.  Follow.       Relevant Orders   Basic Metabolic Panel (BMET) (Completed)  Lipid Profile (Completed)   Hepatic function panel (Completed)   Hyperglycemia    Low carb diet and exercise.  Follow met b and a1c.        Relevant Orders   HgB A1c (Completed)   Hyperthyroidism    Off tapazole.  On synthroid.  Follow tsh.       Relevant Orders   TSH (Completed)   Melanoma in situ ([place])    Just evaluated and being followed by Dr Lacinda Axon.        Moderate episode of recurrent major depressive disorder ([place])    Followed by psychiatry.  Continues on cymbalta.       Osteoporosis    Was on actonel.  Appears to be off now.  Once acute issues improved, will need to schedule bone density.        Persistent cough    Recently diagnosed with sinus infection/cough.  Treated with augmentin.  Is better, but symptoms persists.  Will extend out augmentin x 1 more week.  Check cxr.  Flonase. Albuterol inhaler prn.  Follow.  Discussed probiotics/yogurt.       Relevant Orders   DG Chest 2 View (Completed)   Stress    Followed by psychiatry.        Vaginal irritation    Recent abx.  Treat with diflucan.       Other Visit Diagnoses     Function kidney decreased       Close exposure to COVID-19 virus            Einar Pheasant, MD

## 2021-09-20 ENCOUNTER — Other Ambulatory Visit: Payer: Self-pay

## 2021-09-20 DIAGNOSIS — E7800 Pure hypercholesterolemia, unspecified: Secondary | ICD-10-CM

## 2021-09-20 MED ORDER — ROSUVASTATIN CALCIUM 10 MG PO TABS: 10.0000 mg | ORAL_TABLET | Freq: Every day | ORAL | 3 refills | Status: DC

## 2021-09-20 NOTE — Progress Notes (Signed)
Sent Crestor to [facility] in [place] per Patient request.

## 2021-09-21 ENCOUNTER — Other Ambulatory Visit: Payer: Self-pay | Admitting: Internal Medicine

## 2021-09-21 DIAGNOSIS — E7800 Pure hypercholesterolemia, unspecified: Secondary | ICD-10-CM

## 2021-09-21 NOTE — Progress Notes (Signed)
Order placed for future labs

## 2021-09-22 ENCOUNTER — Encounter: Payer: Self-pay | Admitting: Internal Medicine

## 2021-09-22 DIAGNOSIS — N898 Other specified noninflammatory disorders of vagina: Secondary | ICD-10-CM | POA: Insufficient documentation

## 2021-09-22 NOTE — Assessment & Plan Note (Signed)
Was on actonel.  Appears to be off now.  Once acute issues improved, will need to schedule bone density.

## 2021-09-22 NOTE — Assessment & Plan Note (Signed)
Off tapazole.  On synthroid.  Follow tsh.  

## 2021-09-22 NOTE — Assessment & Plan Note (Signed)
Low carb diet and exercise.  Follow met b and a1c.  

## 2021-09-22 NOTE — Assessment & Plan Note (Signed)
Recent abx.  Treat with diflucan.

## 2021-09-22 NOTE — Assessment & Plan Note (Signed)
Low cholesterol diet and exercise as tolerated.  Follow.  

## 2021-09-22 NOTE — Assessment & Plan Note (Signed)
Followed by psychiatry 

## 2021-09-22 NOTE — Assessment & Plan Note (Addendum)
Recently diagnosed with sinus infection/cough.  Treated with augmentin.  Is better, but symptoms persists.  Will extend out augmentin x 1 more week.  Check cxr.  Flonase. Albuterol inhaler prn.  Follow.  Discussed probiotics/yogurt.

## 2021-09-22 NOTE — Assessment & Plan Note (Addendum)
Since covid has had multiple residual symptoms/problems.  Initially had persistent sob and DOE.  Had extensive evaluation and w/up by pulmonary.  Breathing has been better overall.  Had described persistent problems with memory, tremors, paresthesias and trouble concentrating and focusing.  MRI normal.  NCS and EMG normal.  Saw neurology.  No clear neurological etiology found.  She is seeing psychiatry - treating anxiety and depression.  Now with increased joint stiffness and pain.  Diagnosed with OA.  On diclofenac and gabapentin.  Recent follow up- per note - they discussed long covid/fibromyalgia.  Recommended continuing cymbalta, diclofenac and gabapentin.

## 2021-09-22 NOTE — Assessment & Plan Note (Signed)
Followed by psychiatry.  Continues on cymbalta.  

## 2021-09-22 NOTE — Assessment & Plan Note (Signed)
Just evaluated and being followed by Dr Lacinda Axon.

## 2021-09-24 ENCOUNTER — Encounter: Payer: Self-pay | Admitting: *Deleted

## 2021-09-25 ENCOUNTER — Telehealth: Payer: Self-pay

## 2021-09-25 NOTE — Telephone Encounter (Signed)
Lvm for pt to return call in regards to cxr results.  Per Dr.Scott: Notify - cxr reveals no acute abnormality. Continue current treatment regimen.  Let us know if persistent cough/congestion.  Also, with recent labs, I had recommended restarting crestor.  Does she have an allergy to crestor.  Previously was on crestor, did she have problems taking the medication?  Also, needs mammogram scheduled.  Will need to clarify where she wants mammogram (overdue)

## 2021-10-21 ENCOUNTER — Telehealth: Payer: Self-pay | Admitting: Internal Medicine

## 2021-10-21 DIAGNOSIS — E039 Hypothyroidism, unspecified: Secondary | ICD-10-CM

## 2021-10-21 NOTE — Telephone Encounter (Signed)
Patient would like to know if Dr Nicki Reaper would but in lab orders to have her thyroid check.

## 2021-10-21 NOTE — Telephone Encounter (Signed)
I called patient for better understanding of why she wanted TSH checked so soon. She stated that she has gaining weight, losing hair, moody & irritable. She feels that her values may have changed. She said that psychiatry wanted to make sure not related as this may just be mental or stress related. Hse knows that she may have to pay for lab. Okay to order?

## 2021-10-22 NOTE — Addendum Note (Signed)
Addended by: Alisa Graff on: 10/22/2021 03:01 AM   Modules accepted: Orders

## 2021-10-22 NOTE — Telephone Encounter (Signed)
I have placed the order for tsh.  She is scheduled on 11/01/21 for liver panel check.  Does she want to get both at the same time?

## 2021-10-23 NOTE — Telephone Encounter (Signed)
Pt notified that TSH was ordered & she did want done same day. I have added to lab appointment note on 10/6.

## 2021-11-01 ENCOUNTER — Other Ambulatory Visit (INDEPENDENT_AMBULATORY_CARE_PROVIDER_SITE_OTHER): Payer: No Typology Code available for payment source

## 2021-11-01 DIAGNOSIS — E039 Hypothyroidism, unspecified: Secondary | ICD-10-CM | POA: Diagnosis not present

## 2021-11-01 DIAGNOSIS — E7800 Pure hypercholesterolemia, unspecified: Secondary | ICD-10-CM | POA: Diagnosis not present

## 2021-11-01 LAB — HEPATIC FUNCTION PANEL
ALT: 28 U/L (ref 0–35)
AST: 23 U/L (ref 0–37)
Albumin: 4.4 g/dL (ref 3.5–5.2)
Alkaline Phosphatase: 104 U/L (ref 39–117)
Bilirubin, Direct: 0.1 mg/dL (ref 0.0–0.3)
Total Bilirubin: 0.6 mg/dL (ref 0.2–1.2)
Total Protein: 6.5 g/dL (ref 6.0–8.3)

## 2021-11-01 LAB — TSH: TSH: 1.87 u[IU]/mL (ref 0.35–5.50)

## 2021-11-14 ENCOUNTER — Encounter: Payer: Self-pay | Admitting: Family Medicine

## 2021-11-14 ENCOUNTER — Telehealth (INDEPENDENT_AMBULATORY_CARE_PROVIDER_SITE_OTHER): Payer: No Typology Code available for payment source | Admitting: Family Medicine

## 2021-11-14 VITALS — BP 110/72 | HR 60 | Temp 98.6°F | Ht 67.0 in | Wt 164.0 lb

## 2021-11-14 DIAGNOSIS — J069 Acute upper respiratory infection, unspecified: Secondary | ICD-10-CM

## 2021-11-14 MED ORDER — IPRATROPIUM BROMIDE 0.03 % NA SOLN: 2.0000 | Freq: Two times a day (BID) | NASAL | 1 refills | Status: DC

## 2021-11-14 MED ORDER — FEXOFENADINE HCL 60 MG PO TABS: 60.0000 mg | ORAL_TABLET | Freq: Two times a day (BID) | ORAL | 1 refills | Status: DC

## 2021-11-14 MED ORDER — OXYMETAZOLINE HCL 0.05 % NA SOLN: 1.0000 | Freq: Two times a day (BID) | NASAL | 0 refills | Status: DC

## 2021-11-14 NOTE — Progress Notes (Signed)
[facility] Tele[facility] Visit  Patient consented to have virtual visit and was identified by name and date of birth. Method of visit: Video  Encounter participants: Patient: Amber Richardson - located at home Provider: Carollee Leitz - located at office Others (if applicable): none  Chief Complaint: sinus congestion  HPI:  Symptoms of sinus congestion, scratchy throat, runny nose and productive cough started 3 days ago.  Scratchy throat has since resolved.  Continues to cough up yellow mucus.  Endorses nasal pressure and stuffy nose that is worse at night.  Taking Flonase and Ipratropium nasal sprays.  Home COVID test negative.  Brother has similar symptoms.  Has been taking Mucinex DM BID and Dextromethorphan q 12h with relief of symptoms.  Denies any fevers, muscle aches, decrease in appetite,  nausea or vomiting.  Hydrating well.  ROS: per HPI  Pertinent PMHx:  Recurrent sinusitis  Exam:  BP 110/72 (BP Location: Left Arm, Patient Position: Sitting, Cuff Size: Normal)   Pulse 60   Temp 98.6 F (37 C) (Oral)   Ht '5\' 7"'$  (1.702 m)   Wt 164 lb (74.4 kg)   LMP 01/13/2008   SpO2 98%   BMI 25.69 kg/m   Respiratory: Speaking in full sentences. No increased work of breathing. Sounds nasally congested.  Assessment/Plan:  Viral URI with cough Likely viral etiology given contact with person who has similar symptoms and some improvement in symptoms since day of onset.  In no acute respiratory distress -Symptom management -Start Afrin 2 sprays twice a day to each a nostril.  Use no longer than 3 days -Restart Ipratropium nasal spray and Flonase after Afrin -Start Allegra 60 mg two times a day -Stay hydrated -Use Tylenol 325-500 mg every 4-6 hours as needed for headaches -Follow up with PCP if no improvement in symptoms -Strict return precautions provided

## 2021-11-14 NOTE — Patient Instructions (Addendum)
It was a pleasure meeting you today. Thank you for allowing me to take part in your health care.  Our goals for today as we discussed include:  For your sinusitis Start Afrin 2 sprays twice a day to each a nostril.  Use no longer than 3 days Restart Ipratropium nasal spray and Flonase after Afrin Start Allegra 60 mg two times a day Stay hydrated Use Tylenol 325-500 mg every 4-6 hours as needed for headaches   Recommend the Flu vaccine Recommend the RSV vaccine  Recommend the Pneumonia 20 vaccine  Recommend Shingles vaccine.  This is a 2 dose series and can be given at your local pharmacy.  Please talk to your pharmacist about this.   Please follow-up with PCP in 1 week  If you have any questions or concerns, please do not hesitate to call the office at [phone].  I look forward to our next visit and until then take care and stay safe.  Regards,   Carollee Leitz, MD   [facility]   Sinus Infection, Adult A sinus infection is soreness and swelling (inflammation) of your sinuses. Sinuses are hollow spaces in the bones around your face. They are located: Around your eyes. In the middle of your forehead. Behind your nose. In your cheekbones. Your sinuses and nasal passages are lined with a fluid called mucus. Mucus drains out of your sinuses. Swelling can trap mucus in your sinuses. This lets germs (bacteria, virus, or fungus) grow, which leads to infection. Most of the time, this condition is caused by a virus. What are the causes? Allergies. Asthma. Germs. Things that block your nose or sinuses. Growths in the nose (nasal polyps). Chemicals or irritants in the air. A fungus. This is rare. What increases the risk? Having a weak body defense system (immune system). Doing a lot of swimming or diving. Using nasal sprays too much. Smoking. What are the signs or symptoms? The main symptoms of this condition are pain and a feeling of pressure around the  sinuses. Other symptoms include: Stuffy nose (congestion). This may make it hard to breathe through your nose. Runny nose (drainage). Soreness, swelling, and warmth in the sinuses. A cough that may get worse at night. Being unable to smell and taste. Mucus that collects in the throat or the back of the nose (postnasal drip). This may cause a sore throat or bad breath. Being very tired (fatigued). A fever. How is this diagnosed? Your symptoms. Your medical history. A physical exam. Tests to find out if your condition is short-term (acute) or long-term (chronic). Your doctor may: Check your nose for growths (polyps). Check your sinuses using a tool that has a light on one end (endoscope). Check for allergies or germs. Do imaging tests, such as an MRI or CT scan. How is this treated? Treatment for this condition depends on the cause and whether it is short-term or long-term. If caused by a virus, your symptoms should go away on their own within 10 days. You may be given medicines to relieve symptoms. They include: Medicines that shrink swollen tissue in the nose. A spray that treats swelling of the nostrils. Rinses that help get rid of thick mucus in your nose (nasal saline washes). Medicines that treat allergies (antihistamines). Over-the-counter pain relievers. If caused by bacteria, your doctor may wait to see if you will get better without treatment. You may be given antibiotic medicine if you have: A very bad infection. A weak body defense system. If  caused by growths in the nose, surgery may be needed. Follow these instructions at home: Medicines Take, use, or apply over-the-counter and prescription medicines only as told by your doctor. These may include nasal sprays. If you were prescribed an antibiotic medicine, take it as told by your doctor. Do not stop taking it even if you start to feel better. Hydrate and humidify  Drink enough water to keep your pee (urine) pale  yellow. Use a cool mist humidifier to keep the humidity level in your home above 50%. Breathe in steam for 10-15 minutes, 3-4 times a day, or as told by your doctor. You can do this in the bathroom while a hot shower is running. Try not to spend time in cool or dry air. Rest Rest as much as you can. Sleep with your head raised (elevated). Make sure you get enough sleep each night. General instructions  Put a warm, moist washcloth on your face 3-4 times a day, or as often as told by your doctor. Use nasal saline washes as often as told by your doctor. Wash your hands often with soap and water. If you cannot use soap and water, use hand sanitizer. Do not smoke. Avoid being around people who are smoking (secondhand smoke). Keep all follow-up visits. Contact a doctor if: You have a fever. Your symptoms get worse. Your symptoms do not get better within 10 days. Get help right away if: You have a very bad headache. You cannot stop vomiting. You have very bad pain or swelling around your face or eyes. You have trouble seeing. You feel confused. Your neck is stiff. You have trouble breathing. These symptoms may be an emergency. Get help right away. Call 911. Do not wait to see if the symptoms will go away. Do not drive yourself to the hospital. Summary A sinus infection is swelling of your sinuses. Sinuses are hollow spaces in the bones around your face. This condition is caused by tissues in your nose that become inflamed or swollen. This traps germs. These can lead to infection. If you were prescribed an antibiotic medicine, take it as told by your doctor. Do not stop taking it even if you start to feel better. Keep all follow-up visits. This information is not intended to replace advice given to you by your health care provider. Make sure you discuss any questions you have with your health care provider. Document Revised: 12/18/2020 Document Reviewed: 12/18/2020 Elsevier Patient  Education  [place].

## 2021-11-23 ENCOUNTER — Encounter: Payer: Self-pay | Admitting: Family Medicine

## 2021-11-23 DIAGNOSIS — J069 Acute upper respiratory infection, unspecified: Secondary | ICD-10-CM | POA: Insufficient documentation

## 2021-11-23 NOTE — Assessment & Plan Note (Signed)
Likely viral etiology given contact with person who has similar symptoms and some improvement in symptoms since day of onset.  In no acute respiratory distress -Symptom management -Start Afrin 2 sprays twice a day to each a nostril.  Use no longer than 3 days -Restart Ipratropium nasal spray and Flonase after Afrin -Start Allegra 60 mg two times a day -Stay hydrated -Use Tylenol 325-500 mg every 4-6 hours as needed for headaches -Follow up with PCP if no improvement in symptoms -Strict return precautions provided

## 2021-12-24 ENCOUNTER — Other Ambulatory Visit: Payer: Self-pay | Admitting: Internal Medicine

## 2022-01-29 ENCOUNTER — Encounter: Payer: Self-pay | Admitting: Internal Medicine

## 2022-01-29 ENCOUNTER — Ambulatory Visit (INDEPENDENT_AMBULATORY_CARE_PROVIDER_SITE_OTHER): Payer: No Typology Code available for payment source | Admitting: Internal Medicine

## 2022-01-29 VITALS — BP 126/86 | HR 64 | Temp 97.5°F | Resp 15 | Ht 67.0 in | Wt 168.6 lb

## 2022-01-29 DIAGNOSIS — R0981 Nasal congestion: Secondary | ICD-10-CM

## 2022-01-29 DIAGNOSIS — F411 Generalized anxiety disorder: Secondary | ICD-10-CM

## 2022-01-29 DIAGNOSIS — E0590 Thyrotoxicosis, unspecified without thyrotoxic crisis or storm: Secondary | ICD-10-CM

## 2022-01-29 DIAGNOSIS — F439 Reaction to severe stress, unspecified: Secondary | ICD-10-CM

## 2022-01-29 DIAGNOSIS — M810 Age-related osteoporosis without current pathological fracture: Secondary | ICD-10-CM

## 2022-01-29 DIAGNOSIS — R079 Chest pain, unspecified: Secondary | ICD-10-CM

## 2022-01-29 DIAGNOSIS — Z0000 Encounter for general adult medical examination without abnormal findings: Secondary | ICD-10-CM

## 2022-01-29 DIAGNOSIS — R739 Hyperglycemia, unspecified: Secondary | ICD-10-CM

## 2022-01-29 DIAGNOSIS — Z20828 Contact with and (suspected) exposure to other viral communicable diseases: Secondary | ICD-10-CM

## 2022-01-29 DIAGNOSIS — J069 Acute upper respiratory infection, unspecified: Secondary | ICD-10-CM

## 2022-01-29 DIAGNOSIS — E7800 Pure hypercholesterolemia, unspecified: Secondary | ICD-10-CM

## 2022-01-29 DIAGNOSIS — K219 Gastro-esophageal reflux disease without esophagitis: Secondary | ICD-10-CM

## 2022-01-29 DIAGNOSIS — F331 Major depressive disorder, recurrent, moderate: Secondary | ICD-10-CM

## 2022-01-29 DIAGNOSIS — R519 Headache, unspecified: Secondary | ICD-10-CM | POA: Diagnosis not present

## 2022-01-29 DIAGNOSIS — Z1231 Encounter for screening mammogram for malignant neoplasm of breast: Secondary | ICD-10-CM

## 2022-01-29 LAB — CBC WITH DIFFERENTIAL/PLATELET
Basophils Absolute: 0 10*3/uL (ref 0.0–0.1)
Basophils Relative: 0.6 % (ref 0.0–3.0)
Eosinophils Absolute: 0 10*3/uL (ref 0.0–0.7)
Eosinophils Relative: 0.9 % (ref 0.0–5.0)
HCT: 41.4 % (ref 36.0–46.0)
Hemoglobin: 13.6 g/dL (ref 12.0–15.0)
Lymphocytes Relative: 33.3 % (ref 12.0–46.0)
Lymphs Abs: 1.6 10*3/uL (ref 0.7–4.0)
MCHC: 32.9 g/dL (ref 30.0–36.0)
MCV: 98.3 fl (ref 78.0–100.0)
Monocytes Absolute: 0.4 10*3/uL (ref 0.1–1.0)
Monocytes Relative: 8.9 % (ref 3.0–12.0)
Neutro Abs: 2.7 10*3/uL (ref 1.4–7.7)
Neutrophils Relative %: 56.3 % (ref 43.0–77.0)
Platelets: 227 10*3/uL (ref 150.0–400.0)
RBC: 4.21 Mil/uL (ref 3.87–5.11)
RDW: 13.3 % (ref 11.5–15.5)
WBC: 4.8 10*3/uL (ref 4.0–10.5)

## 2022-01-29 LAB — HEPATIC FUNCTION PANEL
ALT: 26 U/L (ref 0–35)
AST: 23 U/L (ref 0–37)
Albumin: 4.6 g/dL (ref 3.5–5.2)
Alkaline Phosphatase: 109 U/L (ref 39–117)
Bilirubin, Direct: 0.2 mg/dL (ref 0.0–0.3)
Total Bilirubin: 0.8 mg/dL (ref 0.2–1.2)
Total Protein: 6.7 g/dL (ref 6.0–8.3)

## 2022-01-29 LAB — POCT INFLUENZA A/B
Influenza A, POC: NEGATIVE
Influenza B, POC: NEGATIVE

## 2022-01-29 LAB — BASIC METABOLIC PANEL
BUN: 22 mg/dL (ref 6–23)
CO2: 29 mEq/L (ref 19–32)
Calcium: 9.4 mg/dL (ref 8.4–10.5)
Chloride: 102 mEq/L (ref 96–112)
Creatinine, Ser: 0.91 mg/dL (ref 0.40–1.20)
GFR: 66.89 mL/min (ref >60.00)
Glucose, Bld: 91 mg/dL (ref 70–99)
Potassium: 4.7 mEq/L (ref 3.5–5.1)
Sodium: 139 mEq/L (ref 135–145)

## 2022-01-29 LAB — LIPID PANEL
Cholesterol: 156 mg/dL (ref 0–200)
HDL: 72 mg/dL (ref >39.00)
LDL Cholesterol: 65 mg/dL (ref 0–99)
NonHDL: 84
Total CHOL/HDL Ratio: 2
Triglycerides: 93 mg/dL (ref 0.0–149.0)
VLDL: 18.6 mg/dL (ref 0.0–40.0)

## 2022-01-29 LAB — HEMOGLOBIN A1C: Hgb A1c MFr Bld: 5.9 % (ref 4.6–6.5)

## 2022-01-29 LAB — SEDIMENTATION RATE: Sed Rate: 7 mm/hr (ref 0–30)

## 2022-01-29 MED ORDER — PANTOPRAZOLE SODIUM 40 MG PO TBEC: 40.0000 mg | DELAYED_RELEASE_TABLET | Freq: Every day | ORAL | 3 refills | Status: DC

## 2022-01-29 MED ORDER — IPRATROPIUM BROMIDE 0.03 % NA SOLN: 2.0000 | Freq: Two times a day (BID) | NASAL | 1 refills | Status: DC

## 2022-01-29 NOTE — Assessment & Plan Note (Addendum)
Physical today 01/29/22.  PAP 03/15/20 - negative satisfactory for evaluation.  Endocervical component is absent. HPV negative. Colonoscopy 2014.

## 2022-01-29 NOTE — Patient Instructions (Signed)
Robitussin DM for cough and congestion as needed.

## 2022-01-29 NOTE — Progress Notes (Unsigned)
Subjective:    Patient ID: Amber Richardson, female    DOB: [birth year], 67 y.o.   MRN: [id number]  Patient here for No chief complaint on file.   HPI Here for a physical exam. Was scheduled for physical exam.  Given that she was sick, visit changed to f/u. Saw rheumatology 06/20/21 - long covid, fibromyalgia and CFS. Recommended to continue diclofenac, duloxetine and gabapentin.  Bone density? Overdue mammogram.    Past Medical History:  Diagnosis Date   Allergies    Basal cell carcinoma    Fatigue 08/30/2014   Generalized anxiety disorder 03/21/2011   GERD (gastroesophageal reflux disease)    History of COVID-19 02/22/2019   HLD (hyperlipidemia)    HSV infection 09/20/2014   Hypercholesterolemia 02/08/2015   Hyperglycemia 08/28/2019   Hyperthyroidism    Major depressive disorder    Osteoporosis 05/13/2015   Pyelonephritis 1985   Scalp, arm, and leg tingling 08/28/2019   Tendinosis 09/12/2013   Rotator cuff tendinosis. Mild capsulitis.   Tremor 08/28/2019   Subtle to mild, upper extremities, left worse than right   Past Surgical History:  Procedure Laterality Date   DILATION AND CURETTAGE OF UTERUS     Miscarriage     x1   TONSILLECTOMY  1963   Family History  Problem Relation Age of Onset   Hypertension Mother    Hypercholesterolemia Mother    Heart disease Father    Hyperlipidemia Father    Hypercholesterolemia Father    Stroke Father    Dementia Father        Vascular dementia related to stroke history   Social History   Socioeconomic History   Marital status: Married    Spouse name: Not on file   Number of children: 0   Years of education: 16   Highest education level: Bachelor's degree (e.g., BA, AB, BS)  Occupational History   Not on file  Tobacco Use   Smoking status: Never   Smokeless tobacco: Never  Vaping Use   Vaping Use: Never used  Substance and Sexual Activity   Alcohol use: No    Alcohol/week: 0.0 standard drinks of alcohol   Drug  use: No   Sexual activity: Not on file  Other Topics Concern   Not on file  Social History Narrative   Right handed   Drinks one cup coffee am and one pepsi   Social Determinants of Health   Financial Resource Strain: Not on file  Food Insecurity: Not on file  Transportation Needs: Not on file  Physical Activity: Not on file  Stress: Not on file  Social Connections: Not on file     Review of Systems     Objective:     LMP 01/13/2008  Wt Readings from Last 3 Encounters:  11/14/21 164 lb (74.4 kg)  09/19/21 161 lb 9.6 oz (73.3 kg)  06/11/21 165 lb (74.8 kg)    Physical Exam   Outpatient Encounter Medications as of 01/29/2022  Medication Sig   albuterol (VENTOLIN HFA) 108 (90 Base) MCG/ACT inhaler TAKE 2 PUFFS BY MOUTH EVERY 6 HOURS AS NEEDED FOR WHEEZE OR SHORTNESS OF BREATH   ALPRAZolam (XANAX) 0.5 MG tablet Take by mouth.   Calcium Citrate-Vitamin D (CITRACAL + D PO) Take by mouth. Take one in the morning & 2 in the afternoon   diclofenac (VOLTAREN) 50 MG EC tablet Take 50 mg by mouth 2 (two) times daily.   DULoxetine (CYMBALTA) 20 MG capsule Take 20 mg by mouth daily.  DULoxetine (CYMBALTA) 60 MG capsule Take 60 mg by mouth daily.   fexofenadine (ALLEGRA ALLERGY) 60 MG tablet Take 1 tablet (60 mg total) by mouth 2 (two) times daily.   fluticasone (FLONASE) 50 MCG/ACT nasal spray SPRAY 2 SPRAYS INTO EACH NOSTRIL EVERY DAY   gabapentin (NEURONTIN) 300 MG capsule Take 300 mg by mouth 3 (three) times daily.   ipratropium (ATROVENT) 0.03 % nasal spray Place 2 sprays into both nostrils every 12 (twelve) hours.   levothyroxine (SYNTHROID) 75 MCG tablet TAKE 1 TABLET BY MOUTH EVERY DAY BEFORE BREAKFAST   Multiple Vitamin (MULTIVITAMIN) tablet Take 1 tablet by mouth daily.   mupirocin ointment (BACTROBAN) 2 % Apply 1 Application topically 2 (two) times daily.   oxymetazoline (AFRIN NASAL SPRAY) 0.05 % nasal spray Place 1 spray into both nostrils 2 (two) times daily.    rosuvastatin (CRESTOR) 10 MG tablet Take 1 tablet (10 mg total) by mouth daily.   traZODone (DESYREL) 50 MG tablet 50 mg. Takes 1/2 tab po prn   triamcinolone cream (KENALOG) 0.1 % Apply 1 application. topically 2 (two) times daily. Until rash resolves   valACYclovir (VALTREX) 500 MG tablet Take one to two tablets by mouth daily   vitamin B-12 (CYANOCOBALAMIN) 1000 MCG tablet Take 1 tablet (1,000 mcg total) by mouth daily.   No facility-administered encounter medications on file as of 01/29/2022.     Lab Results  Component Value Date   WBC 5.0 02/15/2021   HGB 13.5 02/15/2021   HCT 41.7 02/15/2021   PLT 228.0 02/15/2021   GLUCOSE 87 09/19/2021   CHOL 298 (H) 09/19/2021   TRIG 181.0 (H) 09/19/2021   HDL 61.50 09/19/2021   LDLDIRECT 165.6 03/23/2012   LDLCALC 200 (H) 09/19/2021   ALT 28 11/01/2021   AST 23 11/01/2021   NA 140 09/19/2021   K 4.6 09/19/2021   CL 104 09/19/2021   CREATININE 0.91 09/19/2021   BUN 21 09/19/2021   CO2 29 09/19/2021   TSH 1.87 11/01/2021   HGBA1C 6.1 09/19/2021    US Abdomen Complete  Result Date: 10/09/2020 CLINICAL DATA:  Right-sided and right upper quadrant abdominal pain for 6 weeks EXAM: ABDOMEN ULTRASOUND COMPLETE COMPARISON:  Abdominal ultrasound 09/06/2014, CT abdomen/pelvis 12/16/2012 FINDINGS: Gallbladder: No gallstones or wall thickening visualized. No sonographic Murphy sign noted by sonographer. Common bile duct: Diameter: 4 mm Liver: No focal lesion identified. Within normal limits in parenchymal echogenicity. Portal vein is patent on color Doppler imaging with normal direction of blood flow towards the liver. IVC: No abnormality visualized. Pancreas: Visualized portion unremarkable. Spleen: Size and appearance within normal limits. Right Kidney: Length: 10.5 cm. Echogenicity within normal limits. No mass or hydronephrosis visualized. Left Kidney: Length: 10.6 cm. Echogenicity within normal limits. No mass or hydronephrosis visualized. Abdominal  aorta: No aneurysm visualized. Other findings: None. IMPRESSION: Normal abdominal ultrasound. Electronically Signed   By: Valetta Mole M.D.   On: 10/09/2020 13:24       Assessment & Plan:  Hypercholesteremia  Hyperglycemia     Einar Pheasant, MD

## 2022-01-30 ENCOUNTER — Encounter: Payer: Self-pay | Admitting: Internal Medicine

## 2022-01-30 DIAGNOSIS — Z20828 Contact with and (suspected) exposure to other viral communicable diseases: Secondary | ICD-10-CM | POA: Insufficient documentation

## 2022-01-30 LAB — COVID-19, FLU A+B AND RSV
Influenza A, NAA: NOT DETECTED
Influenza B, NAA: NOT DETECTED
RSV, NAA: NOT DETECTED
SARS-CoV-2, NAA: NOT DETECTED

## 2022-01-30 NOTE — Assessment & Plan Note (Signed)
Low carb diet and exercise.  Follow met b and a1c.  

## 2022-01-30 NOTE — Assessment & Plan Note (Signed)
Headache as outlined.  No headache currently.  Treat congestion symptoms.  Will check ESR.  Follow.

## 2022-01-30 NOTE — Assessment & Plan Note (Signed)
Low cholesterol diet and exercise as tolerated.  Follow.

## 2022-01-30 NOTE — Assessment & Plan Note (Signed)
Describes the burning and "sticking of pills".  Start protonix.  Discussed further GI w/up.  Treat with PPI as outlined and pursue cardiology f/u first.  Schedule soon f/u to reassess.  Notify me if symptoms persist.

## 2022-01-30 NOTE — Assessment & Plan Note (Signed)
Followed by psychiatry.  Continues on cymbalta.

## 2022-01-30 NOTE — Assessment & Plan Note (Signed)
Off tapazole.  On synthroid.  Follow tsh.  

## 2022-01-30 NOTE — Assessment & Plan Note (Signed)
Followed by psychiatry 

## 2022-01-30 NOTE — Assessment & Plan Note (Signed)
Followed by psychiatry. Continue cymbalta.  

## 2022-01-30 NOTE — Assessment & Plan Note (Signed)
Overdue mammogram.  Ordered.

## 2022-01-30 NOTE — Assessment & Plan Note (Signed)
Exposure to flu.  Mother in hospital.  Also possible exposure to other respiratory illness. Some congestion/drainage as outlined.  Atrovent nasal spray.  Robitussin DM as directed.  Rapid flu  -negative.  Nasal swab for covid/flu/RSV.  Follow.  Notify if symptoms change.

## 2022-01-30 NOTE — Assessment & Plan Note (Signed)
Once above issues sorted through, need to schedule bone density.

## 2022-01-30 NOTE — Assessment & Plan Note (Signed)
Has been worked up by cardiology.  CTA coronaries - ok.  Persistent intermittent pain as outlined.  Has f/u scheduled with cardiology this month.  EKG - SR/SB with no acute ischemic changes.  Discussed further w/up.  Will keep f/u with cardiology with question of need for any further cardiac w/up.  Treat acid reflux.  Follow closely.  Aware if any change in symptoms, will need to be evaluated.

## 2022-03-26 ENCOUNTER — Ambulatory Visit: Payer: No Typology Code available for payment source | Admitting: Internal Medicine

## 2022-03-26 ENCOUNTER — Encounter: Payer: Self-pay | Admitting: Internal Medicine

## 2022-03-26 VITALS — BP 122/70 | HR 61 | Temp 97.3°F | Ht 67.0 in | Wt 168.4 lb

## 2022-03-26 DIAGNOSIS — D039 Melanoma in situ, unspecified: Secondary | ICD-10-CM

## 2022-03-26 DIAGNOSIS — R197 Diarrhea, unspecified: Secondary | ICD-10-CM | POA: Diagnosis not present

## 2022-03-26 DIAGNOSIS — F439 Reaction to severe stress, unspecified: Secondary | ICD-10-CM

## 2022-03-26 DIAGNOSIS — R300 Dysuria: Secondary | ICD-10-CM | POA: Diagnosis not present

## 2022-03-26 DIAGNOSIS — E7800 Pure hypercholesterolemia, unspecified: Secondary | ICD-10-CM | POA: Diagnosis not present

## 2022-03-26 DIAGNOSIS — F411 Generalized anxiety disorder: Secondary | ICD-10-CM

## 2022-03-26 DIAGNOSIS — E0590 Thyrotoxicosis, unspecified without thyrotoxic crisis or storm: Secondary | ICD-10-CM

## 2022-03-26 DIAGNOSIS — K219 Gastro-esophageal reflux disease without esophagitis: Secondary | ICD-10-CM

## 2022-03-26 DIAGNOSIS — R739 Hyperglycemia, unspecified: Secondary | ICD-10-CM | POA: Diagnosis not present

## 2022-03-26 DIAGNOSIS — R5383 Other fatigue: Secondary | ICD-10-CM

## 2022-03-26 DIAGNOSIS — R10819 Abdominal tenderness, unspecified site: Secondary | ICD-10-CM

## 2022-03-26 LAB — BASIC METABOLIC PANEL
BUN: 25 mg/dL — ABNORMAL HIGH (ref 6–23)
CO2: 30 mEq/L (ref 19–32)
Calcium: 9.6 mg/dL (ref 8.4–10.5)
Chloride: 101 mEq/L (ref 96–112)
Creatinine, Ser: 0.91 mg/dL (ref 0.40–1.20)
GFR: 66.82 mL/min (ref >60.00)
Glucose, Bld: 71 mg/dL (ref 70–99)
Potassium: 4.3 mEq/L (ref 3.5–5.1)
Sodium: 136 mEq/L (ref 135–145)

## 2022-03-26 LAB — CBC WITH DIFFERENTIAL/PLATELET
Basophils Absolute: 0 10*3/uL (ref 0.0–0.1)
Basophils Relative: 0.6 % (ref 0.0–3.0)
Eosinophils Absolute: 0.1 10*3/uL (ref 0.0–0.7)
Eosinophils Relative: 1.1 % (ref 0.0–5.0)
HCT: 39.3 % (ref 36.0–46.0)
Hemoglobin: 13.1 g/dL (ref 12.0–15.0)
Lymphocytes Relative: 33.1 % (ref 12.0–46.0)
Lymphs Abs: 1.9 10*3/uL (ref 0.7–4.0)
MCHC: 33.4 g/dL (ref 30.0–36.0)
MCV: 97.8 fl (ref 78.0–100.0)
Monocytes Absolute: 0.6 10*3/uL (ref 0.1–1.0)
Monocytes Relative: 9.8 % (ref 3.0–12.0)
Neutro Abs: 3.1 10*3/uL (ref 1.4–7.7)
Neutrophils Relative %: 55.4 % (ref 43.0–77.0)
Platelets: 242 10*3/uL (ref 150.0–400.0)
RBC: 4.02 Mil/uL (ref 3.87–5.11)
RDW: 13.4 % (ref 11.5–15.5)
WBC: 5.7 10*3/uL (ref 4.0–10.5)

## 2022-03-26 LAB — HEPATIC FUNCTION PANEL
ALT: 28 U/L (ref 0–35)
AST: 21 U/L (ref 0–37)
Albumin: 4.1 g/dL (ref 3.5–5.2)
Alkaline Phosphatase: 110 U/L (ref 39–117)
Bilirubin, Direct: 0.1 mg/dL (ref 0.0–0.3)
Total Bilirubin: 0.5 mg/dL (ref 0.2–1.2)
Total Protein: 6.3 g/dL (ref 6.0–8.3)

## 2022-03-26 LAB — URINALYSIS, ROUTINE W REFLEX MICROSCOPIC
Bilirubin Urine: NEGATIVE
Hgb urine dipstick: NEGATIVE
Ketones, ur: NEGATIVE
Leukocytes,Ua: NEGATIVE
Nitrite: NEGATIVE
Specific Gravity, Urine: >=1.03 — AB (ref 1.000–1.030)
Total Protein, Urine: NEGATIVE
Urine Glucose: NEGATIVE
Urobilinogen, UA: 1 (ref 0.0–1.0)
pH: 5.5 (ref 5.0–8.0)

## 2022-03-26 NOTE — Progress Notes (Signed)
Subjective:    Patient ID: Amber Richardson, female    DOB: 11/12/1955, 67 y.o.   MRN: [id number]  Patient here for  Chief Complaint  Patient presents with   Medical Management of Chronic Issues    Possible UTI    HPI Saw cardiology 02/20/22 - atypical chest pain.  EKG 02/20/22 - without ischemic changes.  Recommended holding crestor. Saw rheumatology 02/13/22 - persistent diffuse pain and fatigue.  Gabapentin - states now on '300mg'$  tid.  Reports increased stress.  Discussed.  Increased stress in helping take care of her mother and brother and dealing with their health issues.  This puts increased stress with her and her husband as well.  Discussed.  Seeing a therapist.  She does report starting one week ago, noticed some stomach pain.  Stomach rolling.  Developed some diarrhea.  Mother tested positive for sapovirus.  She was having to clean after her.  Feels she has this as well.  She is eating and drinking.  Intermittent loose stool - 1-2/day.  Soft stool.  Discussed eating yogurt and taking probiotic.  Discussed checking stool sample.  Desires to hold.  Is better.  No vomiting.     Past Medical History:  Diagnosis Date   Allergies    Basal cell carcinoma    Fatigue 08/30/2014   Generalized anxiety disorder 03/21/2011   GERD (gastroesophageal reflux disease)    History of COVID-19 02/22/2019   HLD (hyperlipidemia)    HSV infection 09/20/2014   Hypercholesterolemia 02/08/2015   Hyperglycemia 08/28/2019   Hyperthyroidism    Major depressive disorder    Osteoporosis 05/13/2015   Pyelonephritis 1985   Scalp, arm, and leg tingling 08/28/2019   Tendinosis 09/12/2013   Rotator cuff tendinosis. Mild capsulitis.   Tremor 08/28/2019   Subtle to mild, upper extremities, left worse than right   Past Surgical History:  Procedure Laterality Date   DILATION AND CURETTAGE OF UTERUS     Miscarriage     x1   TONSILLECTOMY  1963   Family History  Problem Relation Age of Onset    Hypertension Mother    Hypercholesterolemia Mother    Heart disease Father    Hyperlipidemia Father    Hypercholesterolemia Father    Stroke Father    Dementia Father        Vascular dementia related to stroke history   Social History   Socioeconomic History   Marital status: Married    Spouse name: Not on file   Number of children: 0   Years of education: 16   Highest education level: Bachelor's degree (e.g., BA, AB, BS)  Occupational History   Not on file  Tobacco Use   Smoking status: Never   Smokeless tobacco: Never  Vaping Use   Vaping Use: Never used  Substance and Sexual Activity   Alcohol use: No    Alcohol/week: 0.0 standard drinks of alcohol   Drug use: No   Sexual activity: Not on file  Other Topics Concern   Not on file  Social History Narrative   Right handed   Drinks one cup coffee am and one pepsi   Social Determinants of Health   Financial Resource Strain: Not on file  Food Insecurity: Not on file  Transportation Needs: Not on file  Physical Activity: Not on file  Stress: Not on file  Social Connections: Not on file     Review of Systems  Constitutional:  Positive for fatigue. Negative for fever.  HENT:  Negative  for congestion and sinus pressure.   Respiratory:  Negative for chest tightness.        Breathing overall stable. No increased cough.    Cardiovascular:  Negative for chest pain and palpitations.  Gastrointestinal:  Positive for diarrhea. Negative for constipation and vomiting.  Genitourinary:  Negative for difficulty urinating and dysuria.  Musculoskeletal:  Negative for joint swelling and myalgias.  Skin:  Negative for color change and rash.  Neurological:  Negative for dizziness and headaches.  Psychiatric/Behavioral:  Negative for agitation.        Increased stress as outlined.         Objective:     BP 122/70   Pulse 61   Temp (!) 97.3 F (36.3 C)   Ht '5\' 7"'$  (1.702 m)   Wt 168 lb 6.4 oz (76.4 kg)   LMP 01/13/2008    SpO2 99%   BMI 26.38 kg/m  Wt Readings from Last 3 Encounters:  03/26/22 168 lb 6.4 oz (76.4 kg)  01/29/22 168 lb 9.6 oz (76.5 kg)  11/14/21 164 lb (74.4 kg)    Physical Exam Vitals reviewed.  Constitutional:      General: She is not in acute distress.    Appearance: Normal appearance.  HENT:     Head: Normocephalic and atraumatic.     Right Ear: External ear normal.     Left Ear: External ear normal.  Eyes:     General: No scleral icterus.       Right eye: No discharge.        Left eye: No discharge.     Conjunctiva/sclera: Conjunctivae normal.  Neck:     Thyroid: No thyromegaly.  Cardiovascular:     Rate and Rhythm: Normal rate and regular rhythm.  Pulmonary:     Effort: No respiratory distress.     Breath sounds: Normal breath sounds. No wheezing.  Abdominal:     General: Bowel sounds are normal.     Palpations: Abdomen is soft.     Tenderness: There is no abdominal tenderness.  Musculoskeletal:        General: No swelling or tenderness.     Cervical back: Neck supple. No tenderness.  Lymphadenopathy:     Cervical: No cervical adenopathy.  Skin:    Findings: No erythema or rash.  Neurological:     Mental Status: She is alert.  Psychiatric:        Mood and Affect: Mood normal.        Behavior: Behavior normal.      Outpatient Encounter Medications as of 03/26/2022  Medication Sig   albuterol (VENTOLIN HFA) 108 (90 Base) MCG/ACT inhaler TAKE 2 PUFFS BY MOUTH EVERY 6 HOURS AS NEEDED FOR WHEEZE OR SHORTNESS OF BREATH   ALPRAZolam (XANAX) 0.5 MG tablet Take by mouth.   Calcium Citrate-Vitamin D (CITRACAL + D PO) Take by mouth. Take one in the morning & 2 in the afternoon   diclofenac (VOLTAREN) 50 MG EC tablet Take 50 mg by mouth 2 (two) times daily.   DULoxetine (CYMBALTA) 20 MG capsule Take 20 mg by mouth daily.   DULoxetine (CYMBALTA) 60 MG capsule Take 60 mg by mouth daily.   fluticasone (FLONASE) 50 MCG/ACT nasal spray SPRAY 2 SPRAYS INTO EACH NOSTRIL EVERY  DAY   gabapentin (NEURONTIN) 300 MG capsule Take 300 mg by mouth 3 (three) times daily.   ipratropium (ATROVENT) 0.03 % nasal spray Place 2 sprays into both nostrils every 12 (twelve) hours.   levothyroxine (  SYNTHROID) 75 MCG tablet TAKE 1 TABLET BY MOUTH EVERY DAY BEFORE BREAKFAST   Multiple Vitamin (MULTIVITAMIN) tablet Take 1 tablet by mouth daily.   mupirocin ointment (BACTROBAN) 2 % Apply 1 Application topically 2 (two) times daily.   pantoprazole (PROTONIX) 40 MG tablet Take 1 tablet (40 mg total) by mouth daily.   traZODone (DESYREL) 50 MG tablet 50 mg. Takes 1/2 tab po prn   triamcinolone cream (KENALOG) 0.1 % Apply 1 application. topically 2 (two) times daily. Until rash resolves   valACYclovir (VALTREX) 500 MG tablet Take one to two tablets by mouth daily   vitamin B-12 (CYANOCOBALAMIN) 1000 MCG tablet Take 1 tablet (1,000 mcg total) by mouth daily.   [DISCONTINUED] fexofenadine (ALLEGRA ALLERGY) 60 MG tablet Take 1 tablet (60 mg total) by mouth 2 (two) times daily.   [DISCONTINUED] oxymetazoline (AFRIN NASAL SPRAY) 0.05 % nasal spray Place 1 spray into both nostrils 2 (two) times daily.   [DISCONTINUED] rosuvastatin (CRESTOR) 10 MG tablet Take 1 tablet (10 mg total) by mouth daily.   No facility-administered encounter medications on file as of 03/26/2022.     Lab Results  Component Value Date   WBC 5.7 03/26/2022   HGB 13.1 03/26/2022   HCT 39.3 03/26/2022   PLT 242.0 03/26/2022   GLUCOSE 71 03/26/2022   CHOL 156 01/29/2022   TRIG 93.0 01/29/2022   HDL 72.00 01/29/2022   LDLDIRECT 165.6 03/23/2012   LDLCALC 65 01/29/2022   ALT 28 03/26/2022   AST 21 03/26/2022   NA 136 03/26/2022   K 4.3 03/26/2022   CL 101 03/26/2022   CREATININE 0.91 03/26/2022   BUN 25 (H) 03/26/2022   CO2 30 03/26/2022   TSH 1.87 11/01/2021   HGBA1C 5.9 01/29/2022    US Abdomen Complete  Result Date: 10/09/2020 CLINICAL DATA:  Right-sided and right upper quadrant abdominal pain for 6 weeks  EXAM: ABDOMEN ULTRASOUND COMPLETE COMPARISON:  Abdominal ultrasound 09/06/2014, CT abdomen/pelvis 12/16/2012 FINDINGS: Gallbladder: No gallstones or wall thickening visualized. No sonographic Murphy sign noted by sonographer. Common bile duct: Diameter: 4 mm Liver: No focal lesion identified. Within normal limits in parenchymal echogenicity. Portal vein is patent on color Doppler imaging with normal direction of blood flow towards the liver. IVC: No abnormality visualized. Pancreas: Visualized portion unremarkable. Spleen: Size and appearance within normal limits. Right Kidney: Length: 10.5 cm. Echogenicity within normal limits. No mass or hydronephrosis visualized. Left Kidney: Length: 10.6 cm. Echogenicity within normal limits. No mass or hydronephrosis visualized. Abdominal aorta: No aneurysm visualized. Other findings: None. IMPRESSION: Normal abdominal ultrasound. Electronically Signed   By: Valetta Mole M.D.   On: 10/09/2020 13:24       Assessment & Plan:  Hyperglycemia Assessment & Plan: Low carb diet and exercise.  Follow met b and a1c.    Hypercholesteremia -     Hepatic function panel -     Basic metabolic panel  Dysuria -     Urinalysis, Routine w reflex microscopic -     Urine Culture  Diarrhea, unspecified type Assessment & Plan: Diarrhea and now soft stool as outlined.  Mother has been sick as outlined.  Feels has same.  Discussed checking stool studies.  Wants to hold. She is improving.  Eating and drinking.  Will start eating yogurt or taking a probiotic.  Stay hydrated.  Follow.  Call with update.  Bland foods.  Advance diet slowly.    Orders: -     CBC with Differential/Platelet  Other fatigue  Assessment & Plan: Feel is multifactorial.  Recent diarrhea as outlined.  Also having to care for her mother and brother.  Discussed.  Check cbc, metabolic panel to confirm wnl.     Generalized anxiety disorder Assessment & Plan: Followed by psychiatry. Continue cymbalta.     Gastroesophageal reflux disease, unspecified whether esophagitis present Assessment & Plan: Continue pantoprazole.     Hypercholesterolemia Assessment & Plan: Low cholesterol diet and exercise as tolerated.  Off crestor. Follow.    Hyperthyroidism Assessment & Plan: Off tapazole.  On synthroid.  Follow tsh.    Melanoma in situ, unspecified site [facility]) Assessment & Plan: Evaluated and being followed by Dr Lacinda Axon.     Stress Assessment & Plan: Increased stress.  Discussed.  Followed by psychiatry.  Discussed seeing a therapist.     Suprapubic tenderness Assessment & Plan: Some soreness in suprapubic area.  Was questioning a possible UTI.  Check urine to confirm no infection.       Einar Pheasant, MD

## 2022-03-26 NOTE — Patient Instructions (Signed)
Examples of probiotics - culturelle, florastor or align  Benefiber daily

## 2022-03-27 ENCOUNTER — Telehealth: Payer: Self-pay

## 2022-03-27 LAB — URINE CULTURE
MICRO NUMBER:: 14626633
Result:: NO GROWTH
SPECIMEN QUALITY:: ADEQUATE

## 2022-03-27 LAB — HM MAMMOGRAPHY

## 2022-03-27 NOTE — Telephone Encounter (Signed)
Pt called back and I read message to her and she verbalized understanding

## 2022-03-27 NOTE — Telephone Encounter (Signed)
Lm for pt to cb   Einar Pheasant, MD  [facility] Please call and notify - urinalysis does not appear to be c/w an infection.  Culture is pending.  Will review culture results once available.  White blood cell count, hgb, sodium, potassium and liver function tests are wnl.

## 2022-03-30 ENCOUNTER — Encounter: Payer: Self-pay | Admitting: Internal Medicine

## 2022-03-30 DIAGNOSIS — R10819 Abdominal tenderness, unspecified site: Secondary | ICD-10-CM | POA: Insufficient documentation

## 2022-03-30 NOTE — Assessment & Plan Note (Signed)
Low cholesterol diet and exercise as tolerated.  Off crestor. Follow.

## 2022-03-30 NOTE — Assessment & Plan Note (Signed)
Continue pantoprazole. °

## 2022-03-30 NOTE — Assessment & Plan Note (Signed)
Followed by psychiatry. Continue cymbalta.

## 2022-03-30 NOTE — Assessment & Plan Note (Signed)
Diarrhea and now soft stool as outlined.  Mother has been sick as outlined.  Feels has same.  Discussed checking stool studies.  Wants to hold. She is improving.  Eating and drinking.  Will start eating yogurt or taking a probiotic.  Stay hydrated.  Follow.  Call with update.  Bland foods.  Advance diet slowly.

## 2022-03-30 NOTE — Assessment & Plan Note (Signed)
Off tapazole.  On synthroid.  Follow tsh.

## 2022-03-30 NOTE — Assessment & Plan Note (Signed)
Low carb diet and exercise.  Follow met b and a1c.   

## 2022-03-30 NOTE — Assessment & Plan Note (Signed)
Some soreness in suprapubic area.  Was questioning a possible UTI.  Check urine to confirm no infection.

## 2022-03-30 NOTE — Assessment & Plan Note (Addendum)
Increased stress.  Discussed.  Followed by psychiatry.  Discussed seeing a therapist.

## 2022-03-30 NOTE — Assessment & Plan Note (Addendum)
Evaluated and being followed by Dr Lacinda Axon.

## 2022-03-30 NOTE — Assessment & Plan Note (Signed)
Feel is multifactorial.  Recent diarrhea as outlined.  Also having to care for her mother and brother.  Discussed.  Check cbc, metabolic panel to confirm wnl.

## 2022-04-09 ENCOUNTER — Other Ambulatory Visit: Payer: Self-pay | Admitting: Internal Medicine

## 2022-05-27 ENCOUNTER — Encounter: Payer: Self-pay | Admitting: Internal Medicine

## 2022-05-27 ENCOUNTER — Ambulatory Visit: Payer: No Typology Code available for payment source | Admitting: Internal Medicine

## 2022-05-27 VITALS — BP 126/72 | HR 65 | Temp 98.3°F | Resp 16 | Ht 67.0 in | Wt 167.4 lb

## 2022-05-27 DIAGNOSIS — E0590 Thyrotoxicosis, unspecified without thyrotoxic crisis or storm: Secondary | ICD-10-CM

## 2022-05-27 DIAGNOSIS — D039 Melanoma in situ, unspecified: Secondary | ICD-10-CM

## 2022-05-27 DIAGNOSIS — Z0000 Encounter for general adult medical examination without abnormal findings: Secondary | ICD-10-CM

## 2022-05-27 DIAGNOSIS — R739 Hyperglycemia, unspecified: Secondary | ICD-10-CM

## 2022-05-27 DIAGNOSIS — Z1211 Encounter for screening for malignant neoplasm of colon: Secondary | ICD-10-CM

## 2022-05-27 DIAGNOSIS — K219 Gastro-esophageal reflux disease without esophagitis: Secondary | ICD-10-CM

## 2022-05-27 DIAGNOSIS — F439 Reaction to severe stress, unspecified: Secondary | ICD-10-CM | POA: Diagnosis not present

## 2022-05-27 DIAGNOSIS — F411 Generalized anxiety disorder: Secondary | ICD-10-CM

## 2022-05-27 DIAGNOSIS — E7800 Pure hypercholesterolemia, unspecified: Secondary | ICD-10-CM

## 2022-05-27 DIAGNOSIS — F331 Major depressive disorder, recurrent, moderate: Secondary | ICD-10-CM

## 2022-05-27 MED ORDER — LEVOTHYROXINE SODIUM 75 MCG PO TABS: ORAL_TABLET | ORAL | 1 refills | Status: DC

## 2022-05-27 MED ORDER — FLUTICASONE PROPIONATE 50 MCG/ACT NA SUSP: NASAL | 1 refills | Status: DC

## 2022-05-27 NOTE — Progress Notes (Signed)
Subjective:    Patient ID: Amber Richardson, female    DOB: 1955-11-02, 67 y.o.   MRN: [id number]  Patient here for  Chief Complaint  Patient presents with   Annual Exam    HPI Here for physical exam.  Sees gyn for breast and pelvic exams.  Is doing better.  Discussed increased stress.  Family stress. Is some better.  Talked to her husband.  Trying to stay active.  No chest pain or sob reported.  Pain is better on gabapentin and diclofenac.  Eating.  No nausea or vomiting reported.  No abdominal pain or bowel change reported.  Discussed cholesterol.  Off statin due to intolerance.    Past Medical History:  Diagnosis Date   Allergies    Basal cell carcinoma    Fatigue 08/30/2014   Generalized anxiety disorder 03/21/2011   GERD (gastroesophageal reflux disease)    History of COVID-19 02/22/2019   HLD (hyperlipidemia)    HSV infection 09/20/2014   Hypercholesterolemia 02/08/2015   Hyperglycemia 08/28/2019   Hyperthyroidism    Major depressive disorder    Osteoporosis 05/13/2015   Pyelonephritis 1985   Scalp, arm, and leg tingling 08/28/2019   Tendinosis 09/12/2013   Rotator cuff tendinosis. Mild capsulitis.   Tremor 08/28/2019   Subtle to mild, upper extremities, left worse than right   Past Surgical History:  Procedure Laterality Date   DILATION AND CURETTAGE OF UTERUS     Miscarriage     x1   TONSILLECTOMY  1963   Family History  Problem Relation Age of Onset   Hypertension Mother    Hypercholesterolemia Mother    Heart disease Father    Hyperlipidemia Father    Hypercholesterolemia Father    Stroke Father    Dementia Father        Vascular dementia related to stroke history   Social History   Socioeconomic History   Marital status: Married    Spouse name: Not on file   Number of children: 0   Years of education: 16   Highest education level: Bachelor's degree (e.g., BA, AB, BS)  Occupational History   Not on file  Tobacco Use   Smoking status: Never    Smokeless tobacco: Never  Vaping Use   Vaping Use: Never used  Substance and Sexual Activity   Alcohol use: No    Alcohol/week: 0.0 standard drinks of alcohol   Drug use: No   Sexual activity: Not on file  Other Topics Concern   Not on file  Social History Narrative   Right handed   Drinks one cup coffee am and one pepsi   Social Determinants of Health   Financial Resource Strain: Not on file  Food Insecurity: Not on file  Transportation Needs: Not on file  Physical Activity: Not on file  Stress: Not on file  Social Connections: Not on file     Review of Systems  Constitutional:  Negative for appetite change and unexpected weight change.  HENT:  Negative for congestion, sinus pressure and sore throat.   Eyes:  Negative for pain and visual disturbance.  Respiratory:  Negative for cough, chest tightness and shortness of breath.   Cardiovascular:  Negative for chest pain and palpitations.  Gastrointestinal:  Negative for abdominal pain, diarrhea, nausea and vomiting.  Genitourinary:  Negative for difficulty urinating and dysuria.  Musculoskeletal:  Negative for joint swelling.       Pain improved some on current regimen.    Skin:  Negative for  color change and rash.  Neurological:  Negative for dizziness and headaches.  Hematological:  Negative for adenopathy. Does not bruise/bleed easily.  Psychiatric/Behavioral:  Negative for agitation and dysphoric mood.        Objective:     BP 126/72   Pulse 65   Temp 98.3 F (36.8 C)   Resp 16   Ht 5\' 7"  (1.702 m)   Wt 167 lb 6.4 oz (75.9 kg)   LMP 01/13/2008   SpO2 98%   BMI 26.22 kg/m  Wt Readings from Last 3 Encounters:  05/27/22 167 lb 6.4 oz (75.9 kg)  03/26/22 168 lb 6.4 oz (76.4 kg)  01/29/22 168 lb 9.6 oz (76.5 kg)    Physical Exam Vitals reviewed.  Constitutional:      General: She is not in acute distress.    Appearance: Normal appearance.  HENT:     Head: Normocephalic and atraumatic.     Right  Ear: External ear normal.     Left Ear: External ear normal.  Eyes:     General: No scleral icterus.       Right eye: No discharge.        Left eye: No discharge.     Conjunctiva/sclera: Conjunctivae normal.  Neck:     Thyroid: No thyromegaly.  Cardiovascular:     Rate and Rhythm: Normal rate and regular rhythm.  Pulmonary:     Effort: No respiratory distress.     Breath sounds: Normal breath sounds. No wheezing.  Abdominal:     General: Bowel sounds are normal.     Palpations: Abdomen is soft.     Tenderness: There is no abdominal tenderness.  Genitourinary:    Comments: Per gyn.  Musculoskeletal:        General: No swelling or tenderness.     Cervical back: Neck supple. No tenderness.  Lymphadenopathy:     Cervical: No cervical adenopathy.  Skin:    Findings: No erythema or rash.  Neurological:     Mental Status: She is alert.  Psychiatric:        Mood and Affect: Mood normal.        Behavior: Behavior normal.      Outpatient Encounter Medications as of 05/27/2022  Medication Sig   albuterol (VENTOLIN HFA) 108 (90 Base) MCG/ACT inhaler TAKE 2 PUFFS BY MOUTH EVERY 6 HOURS AS NEEDED FOR WHEEZE OR SHORTNESS OF BREATH   ALPRAZolam (XANAX) 0.5 MG tablet Take by mouth.   Calcium Citrate-Vitamin D (CITRACAL + D PO) Take by mouth. Take one in the morning & 2 in the afternoon   diclofenac (VOLTAREN) 50 MG EC tablet Take 50 mg by mouth 2 (two) times daily.   DULoxetine (CYMBALTA) 20 MG capsule Take 20 mg by mouth daily.   DULoxetine (CYMBALTA) 60 MG capsule Take 60 mg by mouth daily.   fluticasone (FLONASE) 50 MCG/ACT nasal spray SPRAY 2 SPRAYS INTO EACH NOSTRIL EVERY DAY   gabapentin (NEURONTIN) 300 MG capsule Take 300 mg by mouth 3 (three) times daily.   ipratropium (ATROVENT) 0.03 % nasal spray Place 2 sprays into both nostrils every 12 (twelve) hours.   levothyroxine (SYNTHROID) 75 MCG tablet TAKE 1 TABLET BY MOUTH EVERY DAY BEFORE BREAKFAST   Multiple Vitamin  (MULTIVITAMIN) tablet Take 1 tablet by mouth daily.   mupirocin ointment (BACTROBAN) 2 % Apply 1 Application topically 2 (two) times daily.   traZODone (DESYREL) 50 MG tablet 50 mg. Takes 1/2 tab po prn   triamcinolone  cream (KENALOG) 0.1 % Apply 1 application. topically 2 (two) times daily. Until rash resolves   valACYclovir (VALTREX) 500 MG tablet Take one to two tablets by mouth daily   vitamin B-12 (CYANOCOBALAMIN) 1000 MCG tablet Take 1 tablet (1,000 mcg total) by mouth daily.   [DISCONTINUED] fluticasone (FLONASE) 50 MCG/ACT nasal spray SPRAY 2 SPRAYS INTO EACH NOSTRIL EVERY DAY   [DISCONTINUED] levothyroxine (SYNTHROID) 75 MCG tablet TAKE 1 TABLET BY MOUTH EVERY DAY BEFORE BREAKFAST   [DISCONTINUED] pantoprazole (PROTONIX) 40 MG tablet TAKE 1 TABLET BY MOUTH EVERY DAY   No facility-administered encounter medications on file as of 05/27/2022.     Lab Results  Component Value Date   WBC 5.7 03/26/2022   HGB 13.1 03/26/2022   HCT 39.3 03/26/2022   PLT 242.0 03/26/2022   GLUCOSE 86 05/27/2022   CHOL 310 (H) 05/27/2022   TRIG 221.0 (H) 05/27/2022   HDL 59.70 05/27/2022   LDLDIRECT 213.0 05/27/2022   LDLCALC 65 01/29/2022   ALT 23 05/27/2022   AST 21 05/27/2022   NA 137 05/27/2022   K 4.7 05/27/2022   CL 102 05/27/2022   CREATININE 0.95 05/27/2022   BUN 24 (H) 05/27/2022   CO2 27 05/27/2022   TSH 1.87 11/01/2021   HGBA1C 5.8 05/27/2022    US Abdomen Complete  Result Date: 10/09/2020 CLINICAL DATA:  Right-sided and right upper quadrant abdominal pain for 6 weeks EXAM: ABDOMEN ULTRASOUND COMPLETE COMPARISON:  Abdominal ultrasound 09/06/2014, CT abdomen/pelvis 12/16/2012 FINDINGS: Gallbladder: No gallstones or wall thickening visualized. No sonographic Murphy sign noted by sonographer. Common bile duct: Diameter: 4 mm Liver: No focal lesion identified. Within normal limits in parenchymal echogenicity. Portal vein is patent on color Doppler imaging with normal direction of blood  flow towards the liver. IVC: No abnormality visualized. Pancreas: Visualized portion unremarkable. Spleen: Size and appearance within normal limits. Right Kidney: Length: 10.5 cm. Echogenicity within normal limits. No mass or hydronephrosis visualized. Left Kidney: Length: 10.6 cm. Echogenicity within normal limits. No mass or hydronephrosis visualized. Abdominal aorta: No aneurysm visualized. Other findings: None. IMPRESSION: Normal abdominal ultrasound. Electronically Signed   By: Lesia Hausen M.D.   On: 10/09/2020 13:24       Assessment & Plan:  Routine general medical examination at a health care facility  Hypercholesteremia -     Lipid panel -     Hepatic function panel -     Basic metabolic panel  Hyperglycemia Assessment & Plan: Low carb diet and exercise.  Follow met b and a1c.   Orders: -     Hemoglobin A1c  Colon cancer screening -     Ambulatory referral to Gastroenterology  Healthcare maintenance Assessment & Plan: Physical today 05/27/22. PAP 03/15/20 - negative satisfactory for evaluation.  Endocervical component is absent. HPV negative. Colonoscopy 2014. Due colonoscopy.    Stress Assessment & Plan: Increased stress.  Discussed.  Followed by psychiatry.  Is doing better.  Follow.     Moderate episode of recurrent major depressive disorder [facility]) Assessment & Plan: Followed by psychiatry.  Continues on cymbalta. Appears to be doing better.  Follow.    Melanoma in situ, unspecified site [facility]) Assessment & Plan: Evaluated and being followed by Dr Adriana Simas.     Hyperthyroidism Assessment & Plan: Off tapazole.  On synthroid.  Follow tsh.    Hypercholesterolemia Assessment & Plan: Low cholesterol diet and exercise as tolerated.  Off crestor due to intolerance.  Discussed repatha.  Check lipid panel today.  Gastroesophageal reflux disease, unspecified whether esophagitis present Assessment & Plan: Continue pantoprazole.     Generalized anxiety  disorder Assessment & Plan: Followed by psychiatry. Continue cymbalta.    Other orders -     Fluticasone Propionate; SPRAY 2 SPRAYS INTO EACH NOSTRIL EVERY DAY  Dispense: 16 mL; Refill: 1 -     Levothyroxine Sodium; TAKE 1 TABLET BY MOUTH EVERY DAY BEFORE BREAKFAST  Dispense: 90 tablet; Refill: 1 -     LDL cholesterol, direct     Dale Wilder, MD

## 2022-05-28 LAB — BASIC METABOLIC PANEL
BUN: 24 mg/dL — ABNORMAL HIGH (ref 6–23)
CO2: 27 mEq/L (ref 19–32)
Calcium: 9.1 mg/dL (ref 8.4–10.5)
Chloride: 102 mEq/L (ref 96–112)
Creatinine, Ser: 0.95 mg/dL (ref 0.40–1.20)
GFR: 63.38 mL/min (ref >60.00)
Glucose, Bld: 86 mg/dL (ref 70–99)
Potassium: 4.7 mEq/L (ref 3.5–5.1)
Sodium: 137 mEq/L (ref 135–145)

## 2022-05-28 LAB — HEPATIC FUNCTION PANEL
ALT: 23 U/L (ref 0–35)
AST: 21 U/L (ref 0–37)
Albumin: 4.5 g/dL (ref 3.5–5.2)
Alkaline Phosphatase: 115 U/L (ref 39–117)
Bilirubin, Direct: 0.1 mg/dL (ref 0.0–0.3)
Total Bilirubin: 0.6 mg/dL (ref 0.2–1.2)
Total Protein: 6.7 g/dL (ref 6.0–8.3)

## 2022-05-28 LAB — LIPID PANEL
Cholesterol: 310 mg/dL — ABNORMAL HIGH (ref 0–200)
HDL: 59.7 mg/dL (ref >39.00)
NonHDL: 250.35
Total CHOL/HDL Ratio: 5
Triglycerides: 221 mg/dL — ABNORMAL HIGH (ref 0.0–149.0)
VLDL: 44.2 mg/dL — ABNORMAL HIGH (ref 0.0–40.0)

## 2022-05-28 LAB — HEMOGLOBIN A1C: Hgb A1c MFr Bld: 5.8 % (ref 4.6–6.5)

## 2022-05-28 LAB — LDL CHOLESTEROL, DIRECT: Direct LDL: 213 mg/dL

## 2022-05-29 ENCOUNTER — Telehealth: Payer: Self-pay

## 2022-05-29 NOTE — Telephone Encounter (Signed)
-----   Message from Dale Tabor, MD sent at 05/29/2022  4:36 AM EDT ----- Notify - cholesterol is elevated.  Significantly elevated.  Is off crestor.  Confirm had problems with crestor.  See if agreeable to start repatha - injection q 2 weeks.  Continue diet and exercise.  Overall sugar control stable.  Liver function tests wnl.

## 2022-05-30 ENCOUNTER — Other Ambulatory Visit: Payer: Self-pay

## 2022-05-30 MED ORDER — REPATHA SURECLICK 140 MG/ML ~~LOC~~ SOAJ: 140.0000 mg | SUBCUTANEOUS | 2 refills | Status: DC

## 2022-06-02 ENCOUNTER — Encounter: Payer: Self-pay | Admitting: Internal Medicine

## 2022-06-02 ENCOUNTER — Telehealth: Payer: Self-pay | Admitting: Internal Medicine

## 2022-06-02 NOTE — Assessment & Plan Note (Addendum)
Physical today 05/27/22. PAP 03/15/20 - negative satisfactory for evaluation.  Endocervical component is absent. HPV negative. Colonoscopy 2014. Due colonoscopy.

## 2022-06-02 NOTE — Telephone Encounter (Signed)
In reviewing, she should be due colonoscopy this year.  Please confirm if agreeable for referral and which GI MD she prefers to see.  Also , if talk to her, see lab result note - does not appear have been able to get in touch wit her about starting repatha.  Thanks.

## 2022-06-02 NOTE — Assessment & Plan Note (Signed)
Evaluated and being followed by Dr Cook.   

## 2022-06-02 NOTE — Assessment & Plan Note (Signed)
Low cholesterol diet and exercise as tolerated.  Off crestor due to intolerance.  Discussed repatha.  Check lipid panel today.

## 2022-06-02 NOTE — Assessment & Plan Note (Signed)
Low carb diet and exercise.  Follow met b and a1c.   

## 2022-06-02 NOTE — Assessment & Plan Note (Signed)
Followed by psychiatry. Continue cymbalta.  

## 2022-06-02 NOTE — Assessment & Plan Note (Signed)
Increased stress.  Discussed.  Followed by psychiatry.  Is doing better.  Follow.

## 2022-06-02 NOTE — Assessment & Plan Note (Signed)
Continue pantoprazole. °

## 2022-06-02 NOTE — Assessment & Plan Note (Signed)
Followed by psychiatry.  Continues on cymbalta. Appears to be doing better.  Follow.

## 2022-06-02 NOTE — Telephone Encounter (Signed)
FYI- she was was referred to Dr Mia Creek for colonoscopy 05/27/22 and I sent in the rx for her repatha. She is going to check on the price and let me know if there is a problem with her starting the injections due to cost.

## 2022-06-02 NOTE — Assessment & Plan Note (Signed)
Off tapazole.  On synthroid.  Follow tsh.  °

## 2022-08-06 ENCOUNTER — Telehealth: Payer: Self-pay | Admitting: Internal Medicine

## 2022-08-06 NOTE — Telephone Encounter (Signed)
Pt called in asking for a ED f/u with provider. Pt stated she had a head laceration and went to the ED last night. She stated her stiches needs to come off by Monday 08/06/22. Please advice.

## 2022-08-07 NOTE — Telephone Encounter (Signed)
Called patient to confirm doing ok. Hit in the head with a baseball 7/10. Laceration to scalp. Just sore. No acute issues. Needs sutures removed Monday 7/15 per ED notes. Dr Lorin Picket is out of the office so I scheduled her with Claris Che on Monday for suture removal. Sending to you as FYI  in case you wanted to review ED notes prior to visit.

## 2022-08-11 ENCOUNTER — Telehealth: Payer: Self-pay | Admitting: Family

## 2022-08-11 ENCOUNTER — Ambulatory Visit
Admission: RE | Admit: 2022-08-11 | Discharge: 2022-08-11 | Disposition: A | Discharge status: Home / Self Care | Payer: No Typology Code available for payment source | Attending: Family | Admitting: Family

## 2022-08-11 ENCOUNTER — Ambulatory Visit: Payer: No Typology Code available for payment source | Admitting: Family

## 2022-08-11 ENCOUNTER — Ambulatory Visit
Admission: RE | Admit: 2022-08-11 | Discharge: 2022-08-11 | Disposition: A | Discharge status: Home / Self Care | Payer: No Typology Code available for payment source | Source: Ambulatory Visit | Attending: Family | Admitting: Family

## 2022-08-11 ENCOUNTER — Encounter: Payer: Self-pay | Admitting: Family

## 2022-08-11 VITALS — BP 124/76 | HR 74 | Temp 98.4°F | Ht 67.0 in | Wt 168.4 lb

## 2022-08-11 DIAGNOSIS — R6889 Other general symptoms and signs: Secondary | ICD-10-CM | POA: Insufficient documentation

## 2022-08-11 DIAGNOSIS — S060X0D Concussion without loss of consciousness, subsequent encounter: Secondary | ICD-10-CM | POA: Diagnosis not present

## 2022-08-11 DIAGNOSIS — R6883 Chills (without fever): Secondary | ICD-10-CM | POA: Insufficient documentation

## 2022-08-11 DIAGNOSIS — S0181XD Laceration without foreign body of other part of head, subsequent encounter: Secondary | ICD-10-CM

## 2022-08-11 DIAGNOSIS — S060X0A Concussion without loss of consciousness, initial encounter: Secondary | ICD-10-CM

## 2022-08-11 DIAGNOSIS — R899 Unspecified abnormal finding in specimens from other organs, systems and tissues: Secondary | ICD-10-CM

## 2022-08-11 DIAGNOSIS — S060XAA Concussion with loss of consciousness status unknown, initial encounter: Secondary | ICD-10-CM | POA: Insufficient documentation

## 2022-08-11 DIAGNOSIS — Z4802 Encounter for removal of sutures: Secondary | ICD-10-CM | POA: Diagnosis not present

## 2022-08-11 LAB — URINALYSIS, ROUTINE W REFLEX MICROSCOPIC
Bilirubin Urine: NEGATIVE
Ketones, ur: NEGATIVE
Nitrite: NEGATIVE
RBC / HPF: NONE SEEN (ref 0–?)
Specific Gravity, Urine: 1.02 (ref 1.000–1.030)
Total Protein, Urine: NEGATIVE
Urine Glucose: NEGATIVE
Urobilinogen, UA: 1 (ref 0.0–1.0)
pH: 6 (ref 5.0–8.0)

## 2022-08-11 LAB — COMPREHENSIVE METABOLIC PANEL
ALT: 52 U/L — ABNORMAL HIGH (ref 0–35)
AST: 33 U/L (ref 0–37)
Albumin: 3.8 g/dL (ref 3.5–5.2)
Alkaline Phosphatase: 131 U/L — ABNORMAL HIGH (ref 39–117)
BUN: 19 mg/dL (ref 6–23)
CO2: 28 mEq/L (ref 19–32)
Calcium: 9.2 mg/dL (ref 8.4–10.5)
Chloride: 103 mEq/L (ref 96–112)
Creatinine, Ser: 0.8 mg/dL (ref 0.40–1.20)
GFR: 77.78 mL/min (ref >60.00)
Glucose, Bld: 106 mg/dL — ABNORMAL HIGH (ref 70–99)
Potassium: 4 mEq/L (ref 3.5–5.1)
Sodium: 140 mEq/L (ref 135–145)
Total Bilirubin: 0.5 mg/dL (ref 0.2–1.2)
Total Protein: 6.5 g/dL (ref 6.0–8.3)

## 2022-08-11 LAB — CBC WITH DIFFERENTIAL/PLATELET
Basophils Absolute: 0 10*3/uL (ref 0.0–0.1)
Basophils Relative: 0.5 % (ref 0.0–3.0)
Eosinophils Absolute: 0.1 10*3/uL (ref 0.0–0.7)
Eosinophils Relative: 0.6 % (ref 0.0–5.0)
HCT: 36.7 % (ref 36.0–46.0)
Hemoglobin: 12 g/dL (ref 12.0–15.0)
Lymphocytes Relative: 17.7 % (ref 12.0–46.0)
Lymphs Abs: 1.5 10*3/uL (ref 0.7–4.0)
MCHC: 32.8 g/dL (ref 30.0–36.0)
MCV: 97.6 fl (ref 78.0–100.0)
Monocytes Absolute: 1.1 10*3/uL — ABNORMAL HIGH (ref 0.1–1.0)
Monocytes Relative: 13.3 % — ABNORMAL HIGH (ref 3.0–12.0)
Neutro Abs: 5.7 10*3/uL (ref 1.4–7.7)
Neutrophils Relative %: 67.9 % (ref 43.0–77.0)
Platelets: 340 10*3/uL (ref 150.0–400.0)
RBC: 3.76 Mil/uL — ABNORMAL LOW (ref 3.87–5.11)
RDW: 12.8 % (ref 11.5–15.5)
WBC: 8.4 10*3/uL (ref 4.0–10.5)

## 2022-08-11 LAB — TSH: TSH: 6.29 u[IU]/mL — ABNORMAL HIGH (ref 0.35–5.50)

## 2022-08-11 NOTE — Assessment & Plan Note (Signed)
Concern for fatigue after being hit by baseball, suffering laceration. Discussed concussion.  No loss of consciousness.  Sutures removed today. She will send injury, she has had cold and heat with tolerance.  TSH is elevated and concern for hypothyroidism

## 2022-08-11 NOTE — Telephone Encounter (Signed)
Amber Richardson  Can I add on urine culture to urine from today?  UA is abnormal

## 2022-08-11 NOTE — Telephone Encounter (Signed)
Add on sheet faxed 

## 2022-08-11 NOTE — Progress Notes (Unsigned)
Assessment & Plan:  Concussion without loss of consciousness, initial encounter Assessment & Plan: Concern for fatigue after being hit by baseball, suffering laceration. Discussed concussion.  No loss of consciousness.  Sutures removed today. Referral to orthopedic for evaluation of concussion.     Orders: -     Ambulatory referral to Sports Medicine  Chills Assessment & Plan: Patient is afebrile.  Out of abundance of caution, pending  urine culture and CXR.   I question if feeling hot and cold is more of a cold intolerance r/t thyroid disease.  Based on elevated TSH, question if she is hypothyroid at this time.  Call out the patient to confirm how she is taking Synthroid 75 mcg daily and advise how to increase to every day.   Orders: -     TSH -     CBC with Differential/Platelet -     Comprehensive metabolic panel -     Urinalysis, Routine w reflex microscopic -     DG Chest 2 View; Future     Return precautions given.   Risks, benefits, and alternatives of the medications and treatment plan prescribed today were discussed, and patient expressed understanding.   Education regarding symptom management and diagnosis given to patient on AVS either electronically or printed.  Return in about 1 week (around 08/18/2022).  [name], FNP  Subjective:    Patient ID: Amber Richardson, female    DOB: 10/18/1955, 67 y.o.   MRN: [id number]  CC: Amber Richardson is a 67 y.o. female who presents today for an acute visit.    HPI: Accompanied by husband  Soreness over laceration. Here for sutire removal.   She complains of being fatigued and sleeping more.   Endorses feeling more hot or more cold which occurs 3-4 days per day, improving.   No associated fever, cough, sinus congestion, dysuria.   No falls.   Denies double vision, HA  Hit by a baseball approx 60ft off batter on 3rd base line. Denies LOC.   2 inches laceration center of forehead  No once else  sick in the home.       Seen [facility] ED 6 simple interrupted sutures Tetanus updated CT head 07/28/2022 with no acute intracranial abnormality.  White matter hypodensity consistent with chronic ischemic small vessel disease.  Orbits are grossly unremarkable.  Left frontal scalp laceration with minimal contusion.    History of GERD, hypothyroidism ( previously hyperthyroidism).  Previously she had been on Tapazole.  She is compliant with Synthroid 75 mcg    Allergies: Levaquin [levofloxacin in d5w], Crestor [rosuvastatin], Sertraline, and Sulfa antibiotics Current Outpatient Medications on File Prior to Visit  Medication Sig Dispense Refill   albuterol (VENTOLIN HFA) 108 (90 Base) MCG/ACT inhaler TAKE 2 PUFFS BY MOUTH EVERY 6 HOURS AS NEEDED FOR WHEEZE OR SHORTNESS OF BREATH 18 each 1   ALPRAZolam (XANAX) 0.5 MG tablet Take by mouth.     Calcium Citrate-Vitamin D (CITRACAL + D PO) Take by mouth. Take one in the morning & 2 in the afternoon     diclofenac (VOLTAREN) 50 MG EC tablet Take 50 mg by mouth 2 (two) times daily.     DULoxetine (CYMBALTA) 20 MG capsule Take 20 mg by mouth daily.     DULoxetine (CYMBALTA) 60 MG capsule Take 60 mg by mouth daily.     Evolocumab (REPATHA SURECLICK) 140 MG/ML SOAJ Inject 140 mg into the skin every 14 (fourteen) days. 2 mL 2   fluticasone (  FLONASE) 50 MCG/ACT nasal spray SPRAY 2 SPRAYS INTO EACH NOSTRIL EVERY DAY 16 mL 1   gabapentin (NEURONTIN) 300 MG capsule Take 300 mg by mouth 3 (three) times daily.     ipratropium (ATROVENT) 0.03 % nasal spray Place 2 sprays into both nostrils every 12 (twelve) hours. 30 mL 1   Multiple Vitamin (MULTIVITAMIN) tablet Take 1 tablet by mouth daily.     mupirocin ointment (BACTROBAN) 2 % Apply 1 Application topically 2 (two) times daily. 22 g 0   traZODone (DESYREL) 50 MG tablet 50 mg. Takes 1/2 tab po prn     triamcinolone cream (KENALOG) 0.1 % Apply 1 application. topically 2 (two) times daily. Until rash resolves 80 g  0   valACYclovir (VALTREX) 500 MG tablet Take one to two tablets by mouth daily     vitamin B-12 (CYANOCOBALAMIN) 1000 MCG tablet Take 1 tablet (1,000 mcg total) by mouth daily.     No current facility-administered medications on file prior to visit.    Review of Systems  Constitutional:  Positive for fatigue. Negative for chills, fever and unexpected weight change.  HENT:  Negative for congestion.   Eyes:  Negative for visual disturbance.  Respiratory:  Negative for cough.   Cardiovascular:  Negative for chest pain, palpitations and leg swelling.  Gastrointestinal:  Negative for nausea and vomiting.  Endocrine: Positive for cold intolerance and heat intolerance.  Musculoskeletal:  Negative for arthralgias and myalgias.  Skin:  Negative for rash.  Neurological:  Negative for headaches.  Hematological:  Negative for adenopathy.  Psychiatric/Behavioral:  Negative for confusion.       Objective:    BP 124/76   Pulse 74   Temp 98.4 F (36.9 C)   Ht 5\' 7"  (1.702 m)   Wt 168 lb 6.4 oz (76.4 kg)   LMP 01/13/2008   SpO2 94%   BMI 26.38 kg/m   BP Readings from Last 3 Encounters:  08/11/22 124/76  05/27/22 126/72  03/26/22 122/70   Wt Readings from Last 3 Encounters:  08/11/22 168 lb 6.4 oz (76.4 kg)  05/27/22 167 lb 6.4 oz (75.9 kg)  03/26/22 168 lb 6.4 oz (76.4 kg)    Physical Exam Vitals reviewed.  Constitutional:      Appearance: She is well-developed.  HENT:     Head:      Comments: Removed 6 simple interrupted sutures with suture removal kit.  Patient tolerated procedure well.  Minimal bleeding.  No purulent discharge, increased warmth.  Wound edges well-approximated.    Mouth/Throat:     Pharynx: Uvula midline.  Eyes:     Conjunctiva/sclera: Conjunctivae normal.     Pupils: Pupils are equal, round, and reactive to light.     Comments: Fundus normal bilaterally.   Cardiovascular:     Rate and Rhythm: Normal rate and regular rhythm.     Pulses: Normal pulses.      Heart sounds: Normal heart sounds.  Pulmonary:     Effort: Pulmonary effort is normal.     Breath sounds: Normal breath sounds. No wheezing, rhonchi or rales.  Skin:    General: Skin is warm and dry.  Neurological:     Mental Status: She is alert.     Cranial Nerves: No cranial nerve deficit.     Sensory: No sensory deficit.     Deep Tendon Reflexes:     Reflex Scores:      Bicep reflexes are 2+ on the right side and 2+ on the  left side.      Patellar reflexes are 2+ on the right side and 2+ on the left side.    Comments: Grip equal and strong bilateral upper extremities. Gait strong and steady. Able to perform rapid alternating movement without difficulty.   Psychiatric:        Speech: Speech normal.        Behavior: Behavior normal.        Thought Content: Thought content normal.

## 2022-08-11 NOTE — Telephone Encounter (Signed)
Meant to send to [employer]

## 2022-08-12 ENCOUNTER — Ambulatory Visit: Payer: No Typology Code available for payment source

## 2022-08-12 DIAGNOSIS — R899 Unspecified abnormal finding in specimens from other organs, systems and tissues: Secondary | ICD-10-CM

## 2022-08-12 NOTE — Telephone Encounter (Signed)
NOTED

## 2022-08-12 NOTE — Telephone Encounter (Signed)
Patient called asking for her lab results . She also stated she wants to start treatment in the morning.

## 2022-08-13 ENCOUNTER — Other Ambulatory Visit: Payer: Self-pay | Admitting: Family

## 2022-08-13 DIAGNOSIS — N390 Urinary tract infection, site not specified: Secondary | ICD-10-CM

## 2022-08-13 DIAGNOSIS — R899 Unspecified abnormal finding in specimens from other organs, systems and tissues: Secondary | ICD-10-CM

## 2022-08-13 DIAGNOSIS — E0590 Thyrotoxicosis, unspecified without thyrotoxic crisis or storm: Secondary | ICD-10-CM

## 2022-08-13 MED ORDER — LEVOTHYROXINE SODIUM 100 MCG PO TABS: 100.0000 ug | ORAL_TABLET | Freq: Every day | ORAL | 0 refills | Status: DC

## 2022-08-13 MED ORDER — AMOXICILLIN-POT CLAVULANATE 875-125 MG PO TABS
1.0000 | ORAL_TABLET | Freq: Two times a day (BID) | ORAL | 0 refills | Status: AC
Start: 2022-08-13 — End: 2022-08-20

## 2022-08-13 NOTE — Telephone Encounter (Signed)
Pt called in stating that she needs to start her treatment, and she does not understand why we don't have her results when she can see them on MyChart? Pt stated she don't wants to keep with this back and forth "shit" because she needs antibiotics if not she will be ending in the ED.

## 2022-08-13 NOTE — Telephone Encounter (Signed)
Spoke to pt  and went over results in full detail with pt and she is aggreeable to increasing the Synthroid from 75 mcg to 100 mcg daily. Only until recheck of TSH in 6 weeks. Pt was offered appt to come in to discuss new urinary symptoms frequent urination burning itching, but declined appt but Augmentin was sent in for pt as well. Pt has appt I office on 08/22/22 with Dr Lorin Picket

## 2022-08-13 NOTE — Patient Instructions (Signed)
Referral to orthopedic.   Let us know if you dont hear back within a week in regards to an appointment being scheduled.   So that you are aware, if you are [facility] MyChart user , please pay attention to your MyChart messages as you may receive a MyChart message with a phone number to call and schedule this test/appointment own your own from our referral coordinator. This is a new process so I do not want you to miss this message.  If you are not a MyChart user, you will receive a phone call.      I will order chest x-ray as well as labs, urine studies to evaluate for any infection in the setting of cold and heat intolerance.  I do question if this is thyroid in nature.   Please let me know how you are doing

## 2022-08-13 NOTE — Assessment & Plan Note (Signed)
Patient is afebrile.  Out of abundance of caution, pending  urine culture and CXR.   I question if feeling hot and cold is more of a cold intolerance r/t thyroid disease.  Based on elevated TSH, question if she is hypothyroid at this time.  Call out the patient to confirm how she is taking Synthroid 75 mcg daily and advise how to increase to every day.

## 2022-08-13 NOTE — Telephone Encounter (Signed)
Spoke to pt and informed her that the labs have not yet been resulted and the XRAY has not been released. But as soon as it is We would reach back  out to her

## 2022-08-14 LAB — URINE CULTURE
MICRO NUMBER:: 15206112
SPECIMEN QUALITY:: ADEQUATE

## 2022-08-22 ENCOUNTER — Ambulatory Visit (INDEPENDENT_AMBULATORY_CARE_PROVIDER_SITE_OTHER): Payer: No Typology Code available for payment source | Admitting: Internal Medicine

## 2022-08-22 VITALS — BP 112/70 | HR 61 | Temp 97.6°F | Resp 16 | Ht 67.0 in | Wt 166.0 lb

## 2022-08-22 DIAGNOSIS — K219 Gastro-esophageal reflux disease without esophagitis: Secondary | ICD-10-CM

## 2022-08-22 DIAGNOSIS — R109 Unspecified abdominal pain: Secondary | ICD-10-CM | POA: Diagnosis not present

## 2022-08-22 DIAGNOSIS — R300 Dysuria: Secondary | ICD-10-CM

## 2022-08-22 DIAGNOSIS — R7989 Other specified abnormal findings of blood chemistry: Secondary | ICD-10-CM | POA: Diagnosis not present

## 2022-08-22 DIAGNOSIS — S060X0D Concussion without loss of consciousness, subsequent encounter: Secondary | ICD-10-CM

## 2022-08-22 LAB — HEPATIC FUNCTION PANEL
ALT: 25 U/L (ref 0–35)
AST: 24 U/L (ref 0–37)
Albumin: 4.2 g/dL (ref 3.5–5.2)
Alkaline Phosphatase: 124 U/L — ABNORMAL HIGH (ref 39–117)
Bilirubin, Direct: 0.1 mg/dL (ref 0.0–0.3)
Total Bilirubin: 0.5 mg/dL (ref 0.2–1.2)
Total Protein: 6.7 g/dL (ref 6.0–8.3)

## 2022-08-22 LAB — CBC WITH DIFFERENTIAL/PLATELET
Basophils Absolute: 0 10*3/uL (ref 0.0–0.1)
Basophils Relative: 0.6 % (ref 0.0–3.0)
Eosinophils Absolute: 0.1 10*3/uL (ref 0.0–0.7)
Eosinophils Relative: 0.9 % (ref 0.0–5.0)
HCT: 39 % (ref 36.0–46.0)
Hemoglobin: 12.7 g/dL (ref 12.0–15.0)
Lymphocytes Relative: 36.3 % (ref 12.0–46.0)
Lymphs Abs: 2.1 10*3/uL (ref 0.7–4.0)
MCHC: 32.6 g/dL (ref 30.0–36.0)
MCV: 98 fl (ref 78.0–100.0)
Monocytes Absolute: 0.5 10*3/uL (ref 0.1–1.0)
Monocytes Relative: 9 % (ref 3.0–12.0)
Neutro Abs: 3.1 10*3/uL (ref 1.4–7.7)
Neutrophils Relative %: 53.2 % (ref 43.0–77.0)
Platelets: 387 10*3/uL (ref 150.0–400.0)
RBC: 3.98 Mil/uL (ref 3.87–5.11)
RDW: 13.4 % (ref 11.5–15.5)
WBC: 5.9 10*3/uL (ref 4.0–10.5)

## 2022-08-22 LAB — BASIC METABOLIC PANEL
BUN: 21 mg/dL (ref 6–23)
CO2: 29 mEq/L (ref 19–32)
Calcium: 9.3 mg/dL (ref 8.4–10.5)
Chloride: 100 mEq/L (ref 96–112)
Creatinine, Ser: 0.96 mg/dL (ref 0.40–1.20)
GFR: 62.49 mL/min (ref >60.00)
Glucose, Bld: 99 mg/dL (ref 70–99)
Potassium: 4.4 mEq/L (ref 3.5–5.1)
Sodium: 138 mEq/L (ref 135–145)

## 2022-08-22 LAB — URINALYSIS, ROUTINE W REFLEX MICROSCOPIC
Bilirubin Urine: NEGATIVE
Hgb urine dipstick: NEGATIVE
Ketones, ur: NEGATIVE
Leukocytes,Ua: NEGATIVE
Nitrite: NEGATIVE
RBC / HPF: NONE SEEN (ref 0–?)
Specific Gravity, Urine: 1.025 (ref 1.000–1.030)
Total Protein, Urine: NEGATIVE
Urine Glucose: NEGATIVE
Urobilinogen, UA: 0.2 (ref 0.0–1.0)
pH: 6 (ref 5.0–8.0)

## 2022-08-22 NOTE — Progress Notes (Unsigned)
Subjective:    Patient ID: Amber Richardson, female    DOB: 08-09-1955, 67 y.o.   MRN: [id number]  Patient here for  Chief Complaint  Patient presents with   Medical Management of Chronic Issues    HPI Work in appt.  Was seen ED 08/06/22 - hit in head with baseball. CT head ok. Laceration - required sutures. Saw [name] - 08/11/22.  Sutures removed. At f/u visit, she reported being fatigued and sleeping more. Is feeling better.  Still not back to normal. Also reported feeling hot and "more cold". Urine checked and treated for UTI with augmentin. Also synthroid dose adjusted. She had questions about synthroid dose. Discussed.  Symptoms did improve with the abx.  Has noticed over the last few days some return of symptoms. Noticed left flank pain.  Also some dysuria.  This am noticed some pressure - suprapubic pressure. No hematuria.  No vaginal itching or discharge.     Past Medical History:  Diagnosis Date   Allergies    Basal cell carcinoma    Fatigue 08/30/2014   Generalized anxiety disorder 03/21/2011   GERD (gastroesophageal reflux disease)    History of COVID-19 02/22/2019   HLD (hyperlipidemia)    HSV infection 09/20/2014   Hypercholesterolemia 02/08/2015   Hyperglycemia 08/28/2019   Hyperthyroidism    Major depressive disorder    Osteoporosis 05/13/2015   Pyelonephritis 1985   Scalp, arm, and leg tingling 08/28/2019   Tendinosis 09/12/2013   Rotator cuff tendinosis. Mild capsulitis.   Tremor 08/28/2019   Subtle to mild, upper extremities, left worse than right   Past Surgical History:  Procedure Laterality Date   DILATION AND CURETTAGE OF UTERUS     Miscarriage     x1   TONSILLECTOMY  1963   Family History  Problem Relation Age of Onset   Hypertension Mother    Hypercholesterolemia Mother    Heart disease Father    Hyperlipidemia Father    Hypercholesterolemia Father    Stroke Father    Dementia Father        Vascular dementia related to stroke  history   Social History   Socioeconomic History   Marital status: Married    Spouse name: Not on file   Number of children: 0   Years of education: 16   Highest education level: Bachelor's degree (e.g., BA, AB, BS)  Occupational History   Not on file  Tobacco Use   Smoking status: Never   Smokeless tobacco: Never  Vaping Use   Vaping status: Never Used  Substance and Sexual Activity   Alcohol use: No    Alcohol/week: 0.0 standard drinks of alcohol   Drug use: No   Sexual activity: Not on file  Other Topics Concern   Not on file  Social History Narrative   Right handed   Drinks one cup coffee am and one pepsi   Social Determinants of Health   Financial Resource Strain: Low Risk  (07/24/2020)   Received from [facility], [facility]   Overall Financial Resource Strain (CARDIA)    Difficulty of Paying Living Expenses: Not hard at all  Food Insecurity: No Food Insecurity (02/13/2022)   Received from [facility], [facility]   Hunger Vital Sign    Worried About Running Out of Food in the Last Year: Never true    Ran Out of Food in the Last Year: Never true  Transportation Needs: No Transportation Needs (  02/13/2022)   Received from [facility], [facility]   PRAPARE - Transportation    In the past 12 months, has lack of transportation kept you from medical appointments or from getting medications?: No    Lack of Transportation (Non-Medical): No  Physical Activity: Inactive (07/24/2020)   Received from [facility], [facility]   Exercise Vital Sign    Days of Exercise per Week: 0 days    Minutes of Exercise per Session: 0 min  Stress: Stress Concern Present (07/24/2020)   Received from [facility], [facility]   [employer] of Occupational Health - Occupational Stress Questionnaire    Feeling  of Stress : Rather much  Social Connections: Not on file     Review of Systems  Constitutional:  Positive for fatigue. Negative for unexpected weight change.  HENT:  Negative for congestion and sinus pressure.   Respiratory:  Negative for cough, chest tightness and shortness of breath.   Cardiovascular:  Negative for chest pain, palpitations and leg swelling.  Gastrointestinal:  Negative for diarrhea, nausea and vomiting.  Genitourinary:  Positive for dysuria.       Suprapubic pressure.    Musculoskeletal:  Negative for joint swelling and myalgias.  Skin:  Negative for color change and rash.  Neurological:        Headaches better.  Noticed increased headache with increased screen time.   Psychiatric/Behavioral:  Negative for agitation and dysphoric mood.        Objective:     BP 112/70   Pulse 61   Temp 97.6 F (36.4 C)   Resp 16   Ht 5\' 7"  (1.702 m)   Wt 166 lb (75.3 kg)   LMP 01/13/2008   SpO2 98%   BMI 26.00 kg/m  Wt Readings from Last 3 Encounters:  08/22/22 166 lb (75.3 kg)  08/11/22 168 lb 6.4 oz (76.4 kg)  05/27/22 167 lb 6.4 oz (75.9 kg)    Physical Exam Vitals reviewed.  Constitutional:      General: She is not in acute distress.    Appearance: Normal appearance.  HENT:     Head: Normocephalic and atraumatic.     Right Ear: External ear normal.     Left Ear: External ear normal.  Eyes:     General: No scleral icterus.       Right eye: No discharge.        Left eye: No discharge.     Conjunctiva/sclera: Conjunctivae normal.  Neck:     Thyroid: No thyromegaly.  Cardiovascular:     Rate and Rhythm: Normal rate and regular rhythm.  Pulmonary:     Effort: No respiratory distress.     Breath sounds: Normal breath sounds. No wheezing.  Abdominal:     General: Bowel sounds are normal.     Palpations: Abdomen is soft.     Tenderness: There is no abdominal tenderness.  Musculoskeletal:        General: No swelling or tenderness.     Cervical back:  Neck supple. No tenderness.  Lymphadenopathy:     Cervical: No cervical adenopathy.  Skin:    Findings: No erythema or rash.  Neurological:     Mental Status: She is alert.  Psychiatric:        Mood and Affect: Mood normal.        Behavior: Behavior normal.      Outpatient Encounter Medications as of  08/22/2022  Medication Sig   albuterol (VENTOLIN HFA) 108 (90 Base) MCG/ACT inhaler TAKE 2 PUFFS BY MOUTH EVERY 6 HOURS AS NEEDED FOR WHEEZE OR SHORTNESS OF BREATH   ALPRAZolam (XANAX) 0.5 MG tablet Take by mouth.   Calcium Citrate-Vitamin D (CITRACAL + D PO) Take by mouth. Take one in the morning & 2 in the afternoon   diclofenac (VOLTAREN) 50 MG EC tablet Take 50 mg by mouth 2 (two) times daily.   DULoxetine (CYMBALTA) 20 MG capsule Take 20 mg by mouth daily.   DULoxetine (CYMBALTA) 60 MG capsule Take 60 mg by mouth daily.   Evolocumab (REPATHA SURECLICK) 140 MG/ML SOAJ Inject 140 mg into the skin every 14 (fourteen) days.   fluticasone (FLONASE) 50 MCG/ACT nasal spray SPRAY 2 SPRAYS INTO EACH NOSTRIL EVERY DAY   gabapentin (NEURONTIN) 300 MG capsule Take 300 mg by mouth 3 (three) times daily.   ipratropium (ATROVENT) 0.03 % nasal spray Place 2 sprays into both nostrils every 12 (twelve) hours.   levothyroxine (SYNTHROID) 100 MCG tablet Take 1 tablet (100 mcg total) by mouth daily.   Multiple Vitamin (MULTIVITAMIN) tablet Take 1 tablet by mouth daily.   mupirocin ointment (BACTROBAN) 2 % Apply 1 Application topically 2 (two) times daily.   traZODone (DESYREL) 50 MG tablet 50 mg. Takes 1/2 tab po prn   triamcinolone cream (KENALOG) 0.1 % Apply 1 application. topically 2 (two) times daily. Until rash resolves   valACYclovir (VALTREX) 500 MG tablet Take one to two tablets by mouth daily   vitamin B-12 (CYANOCOBALAMIN) 1000 MCG tablet Take 1 tablet (1,000 mcg total) by mouth daily.   No facility-administered encounter medications on file as of 08/22/2022.     Lab Results  Component  Value Date   WBC 5.9 08/22/2022   HGB 12.7 08/22/2022   HCT 39.0 08/22/2022   PLT 387.0 08/22/2022   GLUCOSE 99 08/22/2022   CHOL 310 (H) 05/27/2022   TRIG 221.0 (H) 05/27/2022   HDL 59.70 05/27/2022   LDLDIRECT 213.0 05/27/2022   LDLCALC 65 01/29/2022   ALT 25 08/22/2022   AST 24 08/22/2022   NA 138 08/22/2022   K 4.4 08/22/2022   CL 100 08/22/2022   CREATININE 0.96 08/22/2022   BUN 21 08/22/2022   CO2 29 08/22/2022   TSH 6.29 (H) 08/11/2022   HGBA1C 5.8 05/27/2022    DG Chest 2 View  Result Date: 08/17/2022 CLINICAL DATA:  Chills for 1 week EXAM: CHEST - 2 VIEW COMPARISON:  09/19/2021 FINDINGS: The heart size and mediastinal contours are within normal limits. Both lungs are clear. The visualized skeletal structures are unremarkable. IMPRESSION: No active cardiopulmonary disease. Electronically Signed   By: Alcide Clever M.D.   On: 08/17/2022 20:18       Assessment & Plan:  Dysuria -     Urinalysis, Routine w reflex microscopic -     Urine Culture  Abnormal liver function tests Assessment & Plan: Alkaline phos - increased on recently labs.  Recheck liver panel.    Orders: -     Hepatic function panel  Flank pain Assessment & Plan: Recently treated for UTI.  Symptoms improved.  Noticed over the last few days - dysuria, left flank pain and some suprapubic pressure.  No hematuria.  Check urine to confirm if infection present. Further w/up pending results.   Orders: -     Basic metabolic panel -     CBC with Differential/Platelet -     CT RENAL  STONE STUDY; Future  Abdominal pain, unspecified abdominal location Assessment & Plan: Suprapubic pressure.  Check urine to confirm no infection.   Orders: -     CT RENAL STONE STUDY; Future  Concussion without loss of consciousness, subsequent encounter Assessment & Plan: Recent head injury as outlined.  Headaches better.  Still not back to normal.  Discussed the need to decrease stimulation.  Discussed avoiding driving.  Symptoms have improved overall.  Follow.  Will notify me if desires referral.    Gastroesophageal reflux disease, unspecified whether esophagitis present Assessment & Plan: Continue pantoprazole.        [place], MD

## 2022-08-24 ENCOUNTER — Encounter: Payer: Self-pay | Admitting: Internal Medicine

## 2022-08-24 NOTE — Assessment & Plan Note (Signed)
Continue pantoprazole. °

## 2022-08-24 NOTE — Assessment & Plan Note (Signed)
Alkaline phos - increased on recently labs.  Recheck liver panel.

## 2022-08-24 NOTE — Assessment & Plan Note (Signed)
Recent head injury as outlined.  Headaches better.  Still not back to normal.  Discussed the need to decrease stimulation.  Discussed avoiding driving. Symptoms have improved overall.  Follow.  Will notify me if desires referral.

## 2022-08-24 NOTE — Assessment & Plan Note (Signed)
Suprapubic pressure.  Check urine to confirm no infection.

## 2022-08-24 NOTE — Assessment & Plan Note (Signed)
Recently treated for UTI.  Symptoms improved.  Noticed over the last few days - dysuria, left flank pain and some suprapubic pressure.  No hematuria.  Check urine to confirm if infection present. Further w/up pending results.

## 2022-08-27 ENCOUNTER — Encounter (INDEPENDENT_AMBULATORY_CARE_PROVIDER_SITE_OTHER): Payer: Self-pay

## 2022-08-29 ENCOUNTER — Telehealth: Payer: Self-pay | Admitting: Internal Medicine

## 2022-08-29 NOTE — Telephone Encounter (Signed)
Medication refilled

## 2022-08-29 NOTE — Telephone Encounter (Signed)
Prescription Request  08/29/2022  LOV: 08/22/2022  What is the name of the medication or equipment? Evolocumab (REPATHA SURECLICK) 140 MG/ML SOAJ  Have you contacted your pharmacy to request a refill? Yes   Which pharmacy would you like this sent to?   [id number] [place], [place] - [address] AT [address] [place] [address] Phone: [phone] Fax: [phone]  Patient notified that their request is being sent to the clinical staff for review and that they should receive a response within 2 business days.   Please advise at Mobile [phone] (mobile)

## 2022-09-02 ENCOUNTER — Ambulatory Visit
Admission: RE | Admit: 2022-09-02 | Discharge: 2022-09-02 | Disposition: A | Discharge status: Home / Self Care | Payer: No Typology Code available for payment source | Source: Ambulatory Visit | Attending: Internal Medicine | Admitting: Internal Medicine

## 2022-09-02 DIAGNOSIS — R109 Unspecified abdominal pain: Secondary | ICD-10-CM | POA: Diagnosis present

## 2022-09-09 ENCOUNTER — Other Ambulatory Visit: Payer: Self-pay | Admitting: Internal Medicine

## 2022-09-26 ENCOUNTER — Other Ambulatory Visit: Payer: Self-pay | Admitting: Family Medicine

## 2022-09-26 ENCOUNTER — Encounter: Payer: Self-pay | Admitting: Internal Medicine

## 2022-09-26 ENCOUNTER — Ambulatory Visit (INDEPENDENT_AMBULATORY_CARE_PROVIDER_SITE_OTHER): Payer: No Typology Code available for payment source | Admitting: Internal Medicine

## 2022-09-26 VITALS — BP 128/74 | HR 71 | Temp 98.0°F | Ht 67.0 in | Wt 165.2 lb

## 2022-09-26 DIAGNOSIS — E7800 Pure hypercholesterolemia, unspecified: Secondary | ICD-10-CM | POA: Diagnosis not present

## 2022-09-26 DIAGNOSIS — R350 Frequency of micturition: Secondary | ICD-10-CM

## 2022-09-26 DIAGNOSIS — K219 Gastro-esophageal reflux disease without esophagitis: Secondary | ICD-10-CM

## 2022-09-26 DIAGNOSIS — D039 Melanoma in situ, unspecified: Secondary | ICD-10-CM

## 2022-09-26 DIAGNOSIS — R739 Hyperglycemia, unspecified: Secondary | ICD-10-CM | POA: Diagnosis not present

## 2022-09-26 DIAGNOSIS — R2681 Unsteadiness on feet: Secondary | ICD-10-CM

## 2022-09-26 DIAGNOSIS — F411 Generalized anxiety disorder: Secondary | ICD-10-CM

## 2022-09-26 DIAGNOSIS — L237 Allergic contact dermatitis due to plants, except food: Secondary | ICD-10-CM

## 2022-09-26 DIAGNOSIS — E0590 Thyrotoxicosis, unspecified without thyrotoxic crisis or storm: Secondary | ICD-10-CM

## 2022-09-26 DIAGNOSIS — J069 Acute upper respiratory infection, unspecified: Secondary | ICD-10-CM

## 2022-09-26 DIAGNOSIS — F331 Major depressive disorder, recurrent, moderate: Secondary | ICD-10-CM

## 2022-09-26 DIAGNOSIS — R7989 Other specified abnormal findings of blood chemistry: Secondary | ICD-10-CM

## 2022-09-26 DIAGNOSIS — F439 Reaction to severe stress, unspecified: Secondary | ICD-10-CM

## 2022-09-26 MED ORDER — METHYLPREDNISOLONE 4 MG PO TBPK: ORAL_TABLET | ORAL | 0 refills | Status: DC

## 2022-09-26 NOTE — Progress Notes (Unsigned)
Subjective:    Patient ID: Amber Richardson, female    DOB: 02-Apr-1955, 67 y.o.   MRN: [id number]  Patient here for  Chief Complaint  Patient presents with   Medical Management of Chronic Issues    HPI Here to follow up regarding hypercholesterolemia.  Reports recent exposure to poison ivy.  Persistent rash.  Spreading.  No fever.  Trying to stay active.  Was seen ED 08/06/22 - hit in head with baseball. CT head ok. Laceration - required sutures. See last note for details.  She denies any headache or dizziness currently.  Feels much better than after initial injury.  Has noticed some unsteadiness.  Further specifics, she describes - when goes to stand up and walk, may drift to the side initially.  When walking - no problems walking straight.  Feels is worse if she has been sitting and goes to get up and walk.  Discussed f/u neurology.     Past Medical History:  Diagnosis Date   Allergies    Basal cell carcinoma    Fatigue 08/30/2014   Generalized anxiety disorder 03/21/2011   GERD (gastroesophageal reflux disease)    History of COVID-19 02/22/2019   HLD (hyperlipidemia)    HSV infection 09/20/2014   Hypercholesterolemia 02/08/2015   Hyperglycemia 08/28/2019   Hyperthyroidism    Major depressive disorder    Osteoporosis 05/13/2015   Pyelonephritis 1985   Scalp, arm, and leg tingling 08/28/2019   Tendinosis 09/12/2013   Rotator cuff tendinosis. Mild capsulitis.   Tremor 08/28/2019   Subtle to mild, upper extremities, left worse than right   Past Surgical History:  Procedure Laterality Date   DILATION AND CURETTAGE OF UTERUS     Miscarriage     x1   TONSILLECTOMY  1963   Family History  Problem Relation Age of Onset   Hypertension Mother    Hypercholesterolemia Mother    Heart disease Father    Hyperlipidemia Father    Hypercholesterolemia Father    Stroke Father    Dementia Father        Vascular dementia related to stroke history   Social History    Socioeconomic History   Marital status: Married    Spouse name: Not on file   Number of children: 0   Years of education: 16   Highest education level: Bachelor's degree (e.g., BA, AB, BS)  Occupational History   Not on file  Tobacco Use   Smoking status: Never   Smokeless tobacco: Never  Vaping Use   Vaping status: Never Used  Substance and Sexual Activity   Alcohol use: No    Alcohol/week: 0.0 standard drinks of alcohol   Drug use: No   Sexual activity: Not on file  Other Topics Concern   Not on file  Social History Narrative   Right handed   Drinks one cup coffee am and one pepsi   Social Determinants of Health   Financial Resource Strain: Low Risk  (09/25/2022)   Overall Financial Resource Strain (CARDIA)    Difficulty of Paying Living Expenses: Not very hard  Food Insecurity: No Food Insecurity (09/25/2022)   Hunger Vital Sign    Worried About Running Out of Food in the Last Year: Never true    Ran Out of Food in the Last Year: Never true  Transportation Needs: No Transportation Needs (09/25/2022)   PRAPARE - Administrator, Civil Service (Medical): No    Lack of Transportation (Non-Medical): No  Physical Activity: Sufficiently  Active (09/25/2022)   Exercise Vital Sign    Days of Exercise per Week: 5 days    Minutes of Exercise per Session: 40 min  Stress: No Stress Concern Present (09/25/2022)   [employer] of Occupational Health - Occupational Stress Questionnaire    Feeling of Stress : Not at all  Social Connections: Unknown (09/25/2022)   Social Connection and Isolation Panel [NHANES]    Frequency of Communication with Friends and Family: More than three times a week    Frequency of Social Gatherings with Friends and Family: Once a week    Attends Religious Services: Patient declined    Database administrator or Organizations: No    Attends Engineer, structural: Not on file    Marital Status: Married     Review of Systems   Constitutional:  Negative for appetite change and fever.  HENT:  Negative for congestion and sinus pressure.   Respiratory:  Negative for cough, chest tightness and shortness of breath.   Cardiovascular:  Negative for chest pain, palpitations and leg swelling.  Gastrointestinal:  Negative for abdominal pain, diarrhea, nausea and vomiting.  Genitourinary:  Negative for difficulty urinating and dysuria.  Musculoskeletal:  Negative for joint swelling and myalgias.  Skin:  Negative for color change and rash.  Neurological:  Negative for dizziness and headaches.       Unsteadiness as outlined   Psychiatric/Behavioral:  Negative for agitation and dysphoric mood.        Objective:     BP 128/74   Pulse 71   Temp 98 F (36.7 C) (Oral)   Ht 5\' 7"  (1.702 m)   Wt 165 lb 3.2 oz (74.9 kg)   LMP 01/13/2008   SpO2 98%   BMI 25.87 kg/m  Wt Readings from Last 3 Encounters:  09/26/22 165 lb 3.2 oz (74.9 kg)  08/22/22 166 lb (75.3 kg)  08/11/22 168 lb 6.4 oz (76.4 kg)    Physical Exam Vitals reviewed.  Constitutional:      General: She is not in acute distress.    Appearance: Normal appearance.  HENT:     Head: Normocephalic and atraumatic.     Right Ear: External ear normal.     Left Ear: External ear normal.  Eyes:     General: No scleral icterus.       Right eye: No discharge.        Left eye: No discharge.     Conjunctiva/sclera: Conjunctivae normal.  Neck:     Thyroid: No thyromegaly.  Cardiovascular:     Rate and Rhythm: Normal rate and regular rhythm.  Pulmonary:     Effort: No respiratory distress.     Breath sounds: Normal breath sounds. No wheezing.  Abdominal:     General: Bowel sounds are normal.     Palpations: Abdomen is soft.     Tenderness: There is no abdominal tenderness.  Musculoskeletal:        General: No swelling or tenderness.     Cervical back: Neck supple. No tenderness.  Lymphadenopathy:     Cervical: No cervical adenopathy.  Skin:    Findings:  No erythema or rash.  Neurological:     Mental Status: She is alert.  Psychiatric:        Mood and Affect: Mood normal.        Behavior: Behavior normal.      Outpatient Encounter Medications as of 09/26/2022  Medication Sig   albuterol (VENTOLIN HFA) 108 (90  Base) MCG/ACT inhaler TAKE 2 PUFFS BY MOUTH EVERY 6 HOURS AS NEEDED FOR WHEEZE OR SHORTNESS OF BREATH   ALPRAZolam (XANAX) 0.5 MG tablet Take by mouth.   Calcium Citrate-Vitamin D (CITRACAL + D PO) Take by mouth. Take one in the morning & 2 in the afternoon   diclofenac (VOLTAREN) 50 MG EC tablet Take 50 mg by mouth 2 (two) times daily.   DULoxetine (CYMBALTA) 20 MG capsule Take 20 mg by mouth daily.   DULoxetine (CYMBALTA) 60 MG capsule Take 60 mg by mouth daily.   Evolocumab (REPATHA SURECLICK) 140 MG/ML SOAJ INJECT 140 MG INTO THE SKIN EVERY 14 (FOURTEEN) DAYS.   fluticasone (FLONASE) 50 MCG/ACT nasal spray USE 2 SPRAYS IN EACH NOSTRIL EVERY DAY   gabapentin (NEURONTIN) 300 MG capsule Take 300 mg by mouth 3 (three) times daily.   levothyroxine (SYNTHROID) 100 MCG tablet Take 1 tablet (100 mcg total) by mouth daily.   methylPREDNISolone (MEDROL DOSEPAK) 4 MG TBPK tablet Medrol dose pak - 6 day taper.  Take as directed.   Multiple Vitamin (MULTIVITAMIN) tablet Take 1 tablet by mouth daily.   mupirocin ointment (BACTROBAN) 2 % Apply 1 Application topically 2 (two) times daily.   traZODone (DESYREL) 50 MG tablet 50 mg. Takes 1/2 tab po prn   triamcinolone cream (KENALOG) 0.1 % Apply 1 application. topically 2 (two) times daily. Until rash resolves   valACYclovir (VALTREX) 500 MG tablet Take one to two tablets by mouth daily   vitamin B-12 (CYANOCOBALAMIN) 1000 MCG tablet Take 1 tablet (1,000 mcg total) by mouth daily.   [DISCONTINUED] ipratropium (ATROVENT) 0.03 % nasal spray Place 2 sprays into both nostrils every 12 (twelve) hours.   No facility-administered encounter medications on file as of 09/26/2022.     Lab Results   Component Value Date   WBC 5.9 08/22/2022   HGB 12.7 08/22/2022   HCT 39.0 08/22/2022   PLT 387.0 08/22/2022   GLUCOSE 92 09/26/2022   CHOL 191 09/26/2022   TRIG 148 09/26/2022   HDL 71 09/26/2022   LDLDIRECT 213.0 05/27/2022   LDLCALC 95 09/26/2022   ALT 21 09/26/2022   AST 21 09/26/2022   NA 137 09/26/2022   K 4.7 09/26/2022   CL 102 09/26/2022   CREATININE 0.92 09/26/2022   BUN 22 09/26/2022   CO2 25 09/26/2022   TSH 6.29 (H) 08/11/2022   HGBA1C 5.6 09/26/2022    CT RENAL STONE STUDY  Result Date: 09/06/2022 CLINICAL DATA:  12/16/2012 EXAM: CT ABDOMEN AND PELVIS WITHOUT CONTRAST TECHNIQUE: Multidetector CT imaging of the abdomen and pelvis was performed following the standard protocol without IV contrast. RADIATION DOSE REDUCTION: This exam was performed according to the departmental dose-optimization program which includes automated exposure control, adjustment of the mA and/or kV according to patient size and/or use of iterative reconstruction technique. COMPARISON:  No acute abnormality FINDINGS: Lower chest: No focal hepatic abnormality. Gallbladder unremarkable. Hepatobiliary: No focal abnormality or ductal dilatation. Pancreas: No focal abnormality.  Normal size. Spleen: No adrenal abnormality. No focal renal abnormality. No stones or hydronephrosis. Urinary bladder is unremarkable. Adrenals/Urinary Tract: No adrenal abnormality. No focal renal abnormality. No stones or hydronephrosis. Urinary bladder is unremarkable. Stomach/Bowel: Stomach, large and small bowel grossly unremarkable. Vascular/Lymphatic: No evidence of aneurysm or adenopathy. Reproductive: Uterus and adnexa unremarkable.  No mass. Other: No free fluid or free air. Musculoskeletal: No acute bony abnormality. IMPRESSION: No renal or ureteral stones.  No hydronephrosis. No acute findings in the abdomen or pelvis.  Electronically Signed   By: Charlett Nose M.D.   On: 09/06/2022 14:39       Assessment & Plan:   Unsteady gait Assessment & Plan: Recent head CT after injury - negative.  No headache or dizziness.  No unsteadiness when walking.  Does report noticing some increased symptoms when has been sitting and stands to walk.  Discussed further neurological w/up and evaluation.  Request referral to [facility].   Orders: -     Vitamin B12 -     Ambulatory referral to Neurology  Hyperglycemia Assessment & Plan: Low carb diet and exercise.  Follow met b and a1c.   Orders: -     Hemoglobin A1c  Hypercholesterolemia Assessment & Plan: Low cholesterol diet and exercise as tolerated.  Off crestor due to intolerance.  On repatha.  Check lipid panel with next fasting labs.    Orders: -     Lipid panel -     Hepatic function panel -     BASIC METABOLIC PANEL WITH eGFR  Poison ivy dermatitis Assessment & Plan: Medrol dose pack as directed.  Follow.    Urinary frequency Assessment & Plan: Request urine check to confirm no infection.   Orders: -     Urine Culture -     Urinalysis, Routine w reflex microscopic  Abnormal liver function tests Assessment & Plan: Alkaline phos - increased on recently labs.  Recheck liver panel.     Generalized anxiety disorder Assessment & Plan: Followed by psychiatry. Continue cymbalta.    Gastroesophageal reflux disease, unspecified whether esophagitis present Assessment & Plan: Continue pantoprazole.     Hyperthyroidism Assessment & Plan: Off tapazole.  On synthroid.  Follow tsh.    Melanoma in situ, unspecified site [facility]) Assessment & Plan: Evaluated and being followed by dermatology.    Moderate episode of recurrent major depressive disorder [facility]) Assessment & Plan: Followed by psychiatry.  Continues on cymbalta. Appears to be doing better.  Follow.    Stress Assessment & Plan: Increased stress.  Discussed.  Followed by psychiatry.  Is doing better.  Follow.     Other orders -     methylPREDNISolone; Medrol dose pak - 6 day  taper.  Take as directed.  Dispense: 21 tablet; Refill: 0 -     Extra Urine Specimen -     MICROSCOPIC MESSAGE     Dale Liberty, MD

## 2022-09-27 LAB — HEPATIC FUNCTION PANEL
AG Ratio: 1.8 (calc) (ref 1.0–2.5)
ALT: 21 U/L (ref 6–29)
AST: 21 U/L (ref 10–35)
Albumin: 4.4 g/dL (ref 3.6–5.1)
Alkaline phosphatase (APISO): 118 U/L (ref 37–153)
Bilirubin, Direct: 0.1 mg/dL (ref 0.0–0.2)
Globulin: 2.5 g/dL (ref 1.9–3.7)
Indirect Bilirubin: 0.5 mg/dL (ref 0.2–1.2)
Total Bilirubin: 0.6 mg/dL (ref 0.2–1.2)
Total Protein: 6.9 g/dL (ref 6.1–8.1)

## 2022-09-27 LAB — VITAMIN B12: Vitamin B-12: 404 pg/mL (ref 200–1100)

## 2022-09-27 LAB — BASIC METABOLIC PANEL WITH GFR
BUN: 22 mg/dL (ref 7–25)
CO2: 25 mmol/L (ref 20–32)
Calcium: 9.6 mg/dL (ref 8.6–10.4)
Chloride: 102 mmol/L (ref 98–110)
Creat: 0.92 mg/dL (ref 0.50–1.05)
Glucose, Bld: 92 mg/dL (ref 65–99)
Potassium: 4.7 mmol/L (ref 3.5–5.3)
Sodium: 137 mmol/L (ref 135–146)
eGFR: 70 mL/min/{1.73_m2} (ref >=60)

## 2022-09-27 LAB — LIPID PANEL
Cholesterol: 191 mg/dL (ref <200)
HDL: 71 mg/dL (ref >=50)
LDL Cholesterol (Calc): 95 mg/dL
Non-HDL Cholesterol (Calc): 120 mg/dL (ref <130)
Total CHOL/HDL Ratio: 2.7 (calc) (ref <5.0)
Triglycerides: 148 mg/dL (ref <150)

## 2022-09-27 LAB — URINE CULTURE
MICRO NUMBER:: 15405748
Result:: NO GROWTH
SPECIMEN QUALITY:: ADEQUATE

## 2022-09-27 LAB — HEMOGLOBIN A1C
Hgb A1c MFr Bld: 5.6 %{Hb} (ref <5.7)
Mean Plasma Glucose: 114 mg/dL
eAG (mmol/L): 6.3 mmol/L

## 2022-09-28 ENCOUNTER — Encounter: Payer: Self-pay | Admitting: Internal Medicine

## 2022-09-28 LAB — URINALYSIS, ROUTINE W REFLEX MICROSCOPIC
Bacteria, UA: NONE SEEN /HPF
Bilirubin Urine: NEGATIVE
Glucose, UA: NEGATIVE
Hgb urine dipstick: NEGATIVE
Hyaline Cast: NONE SEEN /LPF
Nitrite: NEGATIVE
RBC / HPF: NONE SEEN /HPF (ref 0–2)
Specific Gravity, Urine: 1.022 (ref 1.001–1.035)
pH: 6 (ref 5.0–8.0)

## 2022-09-28 LAB — EXTRA URINE SPECIMEN

## 2022-09-28 NOTE — Assessment & Plan Note (Signed)
Off tapazole.  On synthroid.  Follow tsh.  °

## 2022-09-28 NOTE — Assessment & Plan Note (Signed)
Request urine check to confirm no infection.

## 2022-09-28 NOTE — Assessment & Plan Note (Signed)
Medrol dose pack as directed.  Follow.

## 2022-09-28 NOTE — Assessment & Plan Note (Signed)
Evaluated and being followed by dermatology.

## 2022-09-28 NOTE — Assessment & Plan Note (Signed)
 Alkaline phos - increased on recently labs.  Recheck liver panel.

## 2022-09-28 NOTE — Assessment & Plan Note (Signed)
Continue pantoprazole. °

## 2022-09-28 NOTE — Assessment & Plan Note (Signed)
Followed by psychiatry. Continue cymbalta.  

## 2022-09-28 NOTE — Assessment & Plan Note (Signed)
Increased stress.  Discussed.  Followed by psychiatry.  Is doing better.  Follow.

## 2022-09-28 NOTE — Assessment & Plan Note (Signed)
Low carb diet and exercise.  Follow met b and a1c.   

## 2022-09-28 NOTE — Assessment & Plan Note (Signed)
Low cholesterol diet and exercise as tolerated.  Off crestor due to intolerance.  On repatha.  Check lipid panel with next fasting labs.

## 2022-09-28 NOTE — Assessment & Plan Note (Signed)
Recent head CT after injury - negative.  No headache or dizziness.  No unsteadiness when walking.  Does report noticing some increased symptoms when has been sitting and stands to walk.  Discussed further neurological w/up and evaluation.  Request referral to [facility].

## 2022-09-28 NOTE — Assessment & Plan Note (Signed)
Followed by psychiatry.  Continues on cymbalta. Appears to be doing better.  Follow.

## 2022-10-06 ENCOUNTER — Telehealth: Payer: Self-pay

## 2022-10-06 MED ORDER — PREDNISONE 10 MG PO TABS: ORAL_TABLET | ORAL | 0 refills | Status: DC

## 2022-10-06 NOTE — Telephone Encounter (Signed)
Pt stated it did get better but did not resolve completely. Agreed to be seen if persists. Rx sent in.

## 2022-10-06 NOTE — Telephone Encounter (Signed)
Per chart review, you gave her a 6 day prednisone taper for her poison ivy at her last visit. She is requesting another round of steroids because the first one did not clear it up. She is out of town.

## 2022-10-06 NOTE — Telephone Encounter (Signed)
Patient states her poison ivy has broken out again.  Patient states it's really bad and she would like to see if she could try a steroid taper that is longer and slower.  Patient states she has already seen Dr. Dale Rockcastle for her poison ivy and was given a medication, but it didn't get rid of it.  Patient states she is at [facility].  Patient states she would like for [place] to please call in any medication to:  [facility], [address] [place], [place] [address], phone:  [phone].

## 2022-10-06 NOTE — Telephone Encounter (Signed)
If she got better, but did not completely resolve, I will extend do a prednisone taper.  If worsening problems or if persistent, will need to be reevaluated.

## 2022-11-05 ENCOUNTER — Other Ambulatory Visit: Payer: Self-pay | Admitting: Family

## 2022-11-05 ENCOUNTER — Other Ambulatory Visit: Payer: Self-pay | Admitting: Internal Medicine

## 2022-11-05 DIAGNOSIS — E0590 Thyrotoxicosis, unspecified without thyrotoxic crisis or storm: Secondary | ICD-10-CM

## 2022-11-23 ENCOUNTER — Other Ambulatory Visit: Payer: Self-pay | Admitting: Internal Medicine

## 2022-12-06 ENCOUNTER — Other Ambulatory Visit: Payer: Self-pay | Admitting: Internal Medicine

## 2022-12-10 ENCOUNTER — Encounter: Payer: Self-pay | Admitting: Internal Medicine

## 2022-12-10 DIAGNOSIS — E0590 Thyrotoxicosis, unspecified without thyrotoxic crisis or storm: Secondary | ICD-10-CM

## 2022-12-10 MED ORDER — LEVOTHYROXINE SODIUM 88 MCG PO TABS: 88.0000 ug | ORAL_TABLET | Freq: Every day | ORAL | 3 refills | Status: DC

## 2022-12-10 NOTE — Telephone Encounter (Signed)
Reviewed labs.  TSH suppressed.  Ok to stop change synthroid to q day.  Need to cancel prescription and send in synthroid q day.  Will need f/u tsh in 6 weeks.

## 2022-12-22 ENCOUNTER — Ambulatory Visit: Payer: No Typology Code available for payment source

## 2022-12-22 DIAGNOSIS — K5730 Diverticulosis of large intestine without perforation or abscess without bleeding: Secondary | ICD-10-CM | POA: Diagnosis not present

## 2022-12-22 DIAGNOSIS — Z1211 Encounter for screening for malignant neoplasm of colon: Secondary | ICD-10-CM | POA: Diagnosis present

## 2022-12-22 DIAGNOSIS — K2970 Gastritis, unspecified, without bleeding: Secondary | ICD-10-CM | POA: Diagnosis not present

## 2022-12-22 DIAGNOSIS — K640 First degree hemorrhoids: Secondary | ICD-10-CM | POA: Diagnosis not present

## 2022-12-22 DIAGNOSIS — K2270 Barrett's esophagus without dysplasia: Secondary | ICD-10-CM | POA: Diagnosis not present

## 2022-12-22 DIAGNOSIS — R1319 Other dysphagia: Secondary | ICD-10-CM | POA: Diagnosis not present

## 2022-12-22 DIAGNOSIS — K6389 Other specified diseases of intestine: Secondary | ICD-10-CM | POA: Diagnosis not present

## 2023-01-02 ENCOUNTER — Ambulatory Visit: Payer: Medicare HMO | Admitting: Internal Medicine

## 2023-01-02 VITALS — BP 120/68 | HR 66 | Temp 98.0°F | Resp 16 | Ht 67.0 in | Wt 166.0 lb

## 2023-01-02 DIAGNOSIS — F439 Reaction to severe stress, unspecified: Secondary | ICD-10-CM

## 2023-01-02 DIAGNOSIS — F331 Major depressive disorder, recurrent, moderate: Secondary | ICD-10-CM

## 2023-01-02 DIAGNOSIS — R2681 Unsteadiness on feet: Secondary | ICD-10-CM

## 2023-01-02 DIAGNOSIS — E7800 Pure hypercholesterolemia, unspecified: Secondary | ICD-10-CM | POA: Diagnosis not present

## 2023-01-02 DIAGNOSIS — K219 Gastro-esophageal reflux disease without esophagitis: Secondary | ICD-10-CM

## 2023-01-02 DIAGNOSIS — R2689 Other abnormalities of gait and mobility: Secondary | ICD-10-CM | POA: Diagnosis not present

## 2023-01-02 DIAGNOSIS — R739 Hyperglycemia, unspecified: Secondary | ICD-10-CM

## 2023-01-02 DIAGNOSIS — E0590 Thyrotoxicosis, unspecified without thyrotoxic crisis or storm: Secondary | ICD-10-CM | POA: Diagnosis not present

## 2023-01-02 DIAGNOSIS — D039 Melanoma in situ, unspecified: Secondary | ICD-10-CM

## 2023-01-02 MED ORDER — PANTOPRAZOLE SODIUM 40 MG PO TBEC: 40.0000 mg | DELAYED_RELEASE_TABLET | Freq: Every day | ORAL | 1 refills | Status: DC

## 2023-01-02 NOTE — Progress Notes (Unsigned)
Subjective:    Patient ID: Amber Richardson, female    DOB: 26-Mar-1955, 67 y.o.   MRN: [id number]  Patient here for  Chief Complaint  Patient presents with   Medical Management of Chronic Issues    HPI Here to follow up regarding hypercholesterolemia. Recent head CT after injury - negative. No headache or dizziness. No unsteadiness when walking. Last visit, she did report noticing some increased symptoms when has been sitting and stands to walk. Was referred to neurology. Saw GI 11/07/22 - recommended EGD and colonoscopy. Colonoscopy 12/22/22 - non bleeding internal hemorrhoids and diverticulosis.  EGD - gastritis - s/p dilation. Recommended f/u colonoscopy in 10 years.    Past Medical History:  Diagnosis Date   Allergies    Basal cell carcinoma    Fatigue 08/30/2014   Generalized anxiety disorder 03/21/2011   GERD (gastroesophageal reflux disease)    History of COVID-19 02/22/2019   HLD (hyperlipidemia)    HSV infection 09/20/2014   Hypercholesterolemia 02/08/2015   Hyperglycemia 08/28/2019   Hyperthyroidism    Major depressive disorder    Osteoporosis 05/13/2015   Pyelonephritis 1985   Scalp, arm, and leg tingling 08/28/2019   Tendinosis 09/12/2013   Rotator cuff tendinosis. Mild capsulitis.   Tremor 08/28/2019   Subtle to mild, upper extremities, left worse than right   Past Surgical History:  Procedure Laterality Date   DILATION AND CURETTAGE OF UTERUS     Miscarriage     x1   TONSILLECTOMY  1963   Family History  Problem Relation Age of Onset   Hypertension Mother    Hypercholesterolemia Mother    Heart disease Father    Hyperlipidemia Father    Hypercholesterolemia Father    Stroke Father    Dementia Father        Vascular dementia related to stroke history   Social History   Socioeconomic History   Marital status: Married    Spouse name: Not on file   Number of children: 0   Years of education: 16   Highest education level: Bachelor's degree  (e.g., BA, AB, BS)  Occupational History   Not on file  Tobacco Use   Smoking status: Never   Smokeless tobacco: Never  Vaping Use   Vaping status: Never Used  Substance and Sexual Activity   Alcohol use: No    Alcohol/week: 0.0 standard drinks of alcohol   Drug use: No   Sexual activity: Not on file  Other Topics Concern   Not on file  Social History Narrative   Right handed   Drinks one cup coffee am and one pepsi   Social Determinants of Health   Financial Resource Strain: Low Risk  (09/25/2022)   Overall Financial Resource Strain (CARDIA)    Difficulty of Paying Living Expenses: Not very hard  Food Insecurity: No Food Insecurity (09/25/2022)   Hunger Vital Sign    Worried About Running Out of Food in the Last Year: Never true    Ran Out of Food in the Last Year: Never true  Transportation Needs: No Transportation Needs (09/25/2022)   PRAPARE - Administrator, Civil Service (Medical): No    Lack of Transportation (Non-Medical): No  Physical Activity: Sufficiently Active (09/25/2022)   Exercise Vital Sign    Days of Exercise per Week: 5 days    Minutes of Exercise per Session: 40 min  Stress: No Stress Concern Present (09/25/2022)   [employer] of Occupational Health - Occupational Stress Questionnaire  Feeling of Stress : Not at all  Social Connections: Unknown (09/25/2022)   Social Connection and Isolation Panel [NHANES]    Frequency of Communication with Friends and Family: More than three times a week    Frequency of Social Gatherings with Friends and Family: Once a week    Attends Religious Services: Patient declined    Database administrator or Organizations: No    Attends Engineer, structural: Not on file    Marital Status: Married     Review of Systems     Objective:     BP 120/68   Pulse 66   Temp 98 F (36.7 C)   Resp 16   Ht 5\' 7"  (1.702 m)   Wt 166 lb (75.3 kg)   LMP 01/13/2008   SpO2 98%   BMI 26.00 kg/m  Wt  Readings from Last 3 Encounters:  01/02/23 166 lb (75.3 kg)  09/26/22 165 lb 3.2 oz (74.9 kg)  08/22/22 166 lb (75.3 kg)    Physical Exam   Outpatient Encounter Medications as of 01/02/2023  Medication Sig   pantoprazole (PROTONIX) 40 MG tablet Take 1 tablet (40 mg total) by mouth daily.   albuterol (VENTOLIN HFA) 108 (90 Base) MCG/ACT inhaler TAKE 2 PUFFS BY MOUTH EVERY 6 HOURS AS NEEDED FOR WHEEZE OR SHORTNESS OF BREATH   ALPRAZolam (XANAX) 0.5 MG tablet Take by mouth.   Calcium Citrate-Vitamin D (CITRACAL + D PO) Take by mouth. Take one in the morning & 2 in the afternoon   diclofenac (VOLTAREN) 50 MG EC tablet Take 50 mg by mouth 2 (two) times daily.   DULoxetine (CYMBALTA) 20 MG capsule Take 20 mg by mouth daily.   DULoxetine (CYMBALTA) 60 MG capsule Take 60 mg by mouth daily.   Evolocumab (REPATHA SURECLICK) 140 MG/ML SOAJ INJECT 140 MG INTO THE SKIN EVERY 14 (FOURTEEN) DAYS.   fluticasone (FLONASE) 50 MCG/ACT nasal spray SPRAY 2 SPRAYS INTO EACH NOSTRIL EVERY DAY   gabapentin (NEURONTIN) 300 MG capsule Take 300 mg by mouth 3 (three) times daily.   ipratropium (ATROVENT) 0.03 % nasal spray PLACE 2 SPRAYS INTO BOTH NOSTRILS EVERY 12 (TWELVE) HOURS.   levothyroxine (SYNTHROID) 88 MCG tablet Take 1 tablet (88 mcg total) by mouth daily.   Multiple Vitamin (MULTIVITAMIN) tablet Take 1 tablet by mouth daily.   traZODone (DESYREL) 50 MG tablet 50 mg. Takes 1/2 tab po prn   triamcinolone cream (KENALOG) 0.1 % Apply 1 application. topically 2 (two) times daily. Until rash resolves   valACYclovir (VALTREX) 500 MG tablet Take one to two tablets by mouth daily   vitamin B-12 (CYANOCOBALAMIN) 1000 MCG tablet Take 1 tablet (1,000 mcg total) by mouth daily.   [DISCONTINUED] mupirocin ointment (BACTROBAN) 2 % Apply 1 Application topically 2 (two) times daily.   [DISCONTINUED] predniSONE (DELTASONE) 10 MG tablet Take 4 tablets x 1 day and then decrease by 1/2 tablet per day until down to zero mg.    No facility-administered encounter medications on file as of 01/02/2023.     Lab Results  Component Value Date   WBC 5.9 08/22/2022   HGB 12.7 08/22/2022   HCT 39.0 08/22/2022   PLT 387.0 08/22/2022   GLUCOSE 92 09/26/2022   CHOL 191 09/26/2022   TRIG 148 09/26/2022   HDL 71 09/26/2022   LDLDIRECT 213.0 05/27/2022   LDLCALC 95 09/26/2022   ALT 21 09/26/2022   AST 21 09/26/2022   NA 137 09/26/2022   K 4.7  09/26/2022   CL 102 09/26/2022   CREATININE 0.92 09/26/2022   BUN 22 09/26/2022   CO2 25 09/26/2022   TSH 6.29 (H) 08/11/2022   HGBA1C 5.6 09/26/2022    CT RENAL STONE STUDY  Result Date: 09/06/2022 CLINICAL DATA:  12/16/2012 EXAM: CT ABDOMEN AND PELVIS WITHOUT CONTRAST TECHNIQUE: Multidetector CT imaging of the abdomen and pelvis was performed following the standard protocol without IV contrast. RADIATION DOSE REDUCTION: This exam was performed according to the departmental dose-optimization program which includes automated exposure control, adjustment of the mA and/or kV according to patient size and/or use of iterative reconstruction technique. COMPARISON:  No acute abnormality FINDINGS: Lower chest: No focal hepatic abnormality. Gallbladder unremarkable. Hepatobiliary: No focal abnormality or ductal dilatation. Pancreas: No focal abnormality.  Normal size. Spleen: No adrenal abnormality. No focal renal abnormality. No stones or hydronephrosis. Urinary bladder is unremarkable. Adrenals/Urinary Tract: No adrenal abnormality. No focal renal abnormality. No stones or hydronephrosis. Urinary bladder is unremarkable. Stomach/Bowel: Stomach, large and small bowel grossly unremarkable. Vascular/Lymphatic: No evidence of aneurysm or adenopathy. Reproductive: Uterus and adnexa unremarkable.  No mass. Other: No free fluid or free air. Musculoskeletal: No acute bony abnormality. IMPRESSION: No renal or ureteral stones.  No hydronephrosis. No acute findings in the abdomen or pelvis.  Electronically Signed   By: Charlett Nose M.D.   On: 09/06/2022 14:39       Assessment & Plan:  Hypercholesterolemia -     Basic metabolic panel; Future -     Hepatic function panel; Future -     Lipid panel; Future  Hyperglycemia -     Basic metabolic panel; Future -     Hemoglobin A1c; Future  Hyperthyroidism -     TSH; Future  Balance problem -     Ambulatory referral to Physical Therapy  Other orders -     Pantoprazole Sodium; Take 1 tablet (40 mg total) by mouth daily.  Dispense: 90 tablet; Refill: 1     Dale Alsen, MD

## 2023-01-04 ENCOUNTER — Encounter: Payer: Self-pay | Admitting: Internal Medicine

## 2023-01-04 NOTE — Assessment & Plan Note (Signed)
Followed by psychiatry.  Continues on cymbalta. Follow.

## 2023-01-04 NOTE — Assessment & Plan Note (Signed)
Being followed by dermatology.  

## 2023-01-04 NOTE — Assessment & Plan Note (Signed)
Low carb diet and exercise.  Follow met b and a1c.   

## 2023-01-04 NOTE — Assessment & Plan Note (Signed)
Back on protonix.  Continue f/u with GI.  Waiting for pathology.

## 2023-01-04 NOTE — Assessment & Plan Note (Signed)
Persistent balance issues as outlined.  Has appt scheduled with neurology.  Discussed Pt to evaluate and treat.  Agreeable for referral.

## 2023-01-04 NOTE — Assessment & Plan Note (Signed)
Increased stress.  Discussed.  Has been followed by psychiatry.  Follow.

## 2023-01-04 NOTE — Assessment & Plan Note (Signed)
Off tapazole.  On synthroid.  Follow tsh.  °

## 2023-01-04 NOTE — Assessment & Plan Note (Signed)
Low cholesterol diet and exercise as tolerated.  Off crestor due to intolerance.  On repatha. Follow lipid panel.

## 2023-02-04 ENCOUNTER — Other Ambulatory Visit: Payer: Self-pay | Admitting: Internal Medicine

## 2023-02-04 DIAGNOSIS — E0590 Thyrotoxicosis, unspecified without thyrotoxic crisis or storm: Secondary | ICD-10-CM

## 2023-02-04 NOTE — Telephone Encounter (Signed)
 Will refill at 1/10 OV.

## 2023-02-06 ENCOUNTER — Other Ambulatory Visit: Payer: Medicare HMO

## 2023-02-13 ENCOUNTER — Other Ambulatory Visit (INDEPENDENT_AMBULATORY_CARE_PROVIDER_SITE_OTHER): Payer: Medicare HMO

## 2023-02-13 DIAGNOSIS — R739 Hyperglycemia, unspecified: Secondary | ICD-10-CM | POA: Diagnosis not present

## 2023-02-13 DIAGNOSIS — E7800 Pure hypercholesterolemia, unspecified: Secondary | ICD-10-CM

## 2023-02-13 DIAGNOSIS — E0590 Thyrotoxicosis, unspecified without thyrotoxic crisis or storm: Secondary | ICD-10-CM | POA: Diagnosis not present

## 2023-02-13 LAB — HEPATIC FUNCTION PANEL
ALT: 25 U/L (ref 0–35)
AST: 20 U/L (ref 0–37)
Albumin: 4.3 g/dL (ref 3.5–5.2)
Alkaline Phosphatase: 108 U/L (ref 39–117)
Bilirubin, Direct: 0.1 mg/dL (ref 0.0–0.3)
Total Bilirubin: 0.4 mg/dL (ref 0.2–1.2)
Total Protein: 6.7 g/dL (ref 6.0–8.3)

## 2023-02-13 LAB — HEMOGLOBIN A1C: Hgb A1c MFr Bld: 5.9 % (ref 4.6–6.5)

## 2023-02-13 LAB — BASIC METABOLIC PANEL
BUN: 24 mg/dL — ABNORMAL HIGH (ref 6–23)
CO2: 28 meq/L (ref 19–32)
Calcium: 9.2 mg/dL (ref 8.4–10.5)
Chloride: 103 meq/L (ref 96–112)
Creatinine, Ser: 0.95 mg/dL (ref 0.40–1.20)
GFR: 63.06 mL/min (ref >60.00)
Glucose, Bld: 87 mg/dL (ref 70–99)
Potassium: 4.4 meq/L (ref 3.5–5.1)
Sodium: 139 meq/L (ref 135–145)

## 2023-02-13 LAB — LIPID PANEL
Cholesterol: 216 mg/dL — ABNORMAL HIGH (ref 0–200)
HDL: 61 mg/dL (ref >39.00)
LDL Cholesterol: 124 mg/dL — ABNORMAL HIGH (ref 0–99)
NonHDL: 155.31
Total CHOL/HDL Ratio: 4
Triglycerides: 159 mg/dL — ABNORMAL HIGH (ref 0.0–149.0)
VLDL: 31.8 mg/dL (ref 0.0–40.0)

## 2023-02-13 LAB — TSH: TSH: 0.87 u[IU]/mL (ref 0.35–5.50)

## 2023-03-02 ENCOUNTER — Encounter: Payer: Self-pay | Admitting: Internal Medicine

## 2023-03-02 ENCOUNTER — Ambulatory Visit: Payer: Medicare HMO | Attending: Internal Medicine

## 2023-03-02 DIAGNOSIS — R2689 Other abnormalities of gait and mobility: Secondary | ICD-10-CM | POA: Diagnosis present

## 2023-03-02 NOTE — Therapy (Incomplete)
OUTPATIENT PHYSICAL THERAPY NEURO EVALUATION   Patient Name: Amber Richardson MRN: [id number] DOB:1955/07/18, 68 y.o., female Today's Date: 03/02/2023   PCP: Dr. Lorin Picket, MD  REFERRING PROVIDER: same  END OF SESSION:  PT End of Session - 03/02/23 1656     Visit Number 1    Number of Visits 13    Date for PT Re-Evaluation 04/13/23    Authorization Type 2x/week x 6 weeks (PN recert on 04/13/23)    Authorization Time Period Medicare, PN needed at visit #10    PT Start Time 1045    PT Stop Time 1125    PT Time Calculation (min) 40 min    Activity Tolerance Patient tolerated treatment well    Behavior During Therapy [facility] for tasks assessed/performed             Past Medical History:  Diagnosis Date   Allergies    Basal cell carcinoma    Fatigue 08/30/2014   Generalized anxiety disorder 03/21/2011   GERD (gastroesophageal reflux disease)    History of COVID-19 02/22/2019   HLD (hyperlipidemia)    HSV infection 09/20/2014   Hypercholesterolemia 02/08/2015   Hyperglycemia 08/28/2019   Hyperthyroidism    Major depressive disorder    Osteoporosis 05/13/2015   Pyelonephritis 1985   Scalp, arm, and leg tingling 08/28/2019   Tendinosis 09/12/2013   Rotator cuff tendinosis. Mild capsulitis.   Tremor 08/28/2019   Subtle to mild, upper extremities, left worse than right   Past Surgical History:  Procedure Laterality Date   DILATION AND CURETTAGE OF UTERUS     Miscarriage     x1   TONSILLECTOMY  1963   Patient Active Problem List   Diagnosis Date Noted   Balance problem 01/02/2023   Poison ivy dermatitis 09/26/2022   Urinary frequency 09/26/2022   Abnormal liver function tests 08/22/2022   Flank pain 08/22/2022   Cold intolerance 08/11/2022   Concussion 08/11/2022   Diarrhea 03/26/2022   Headache 01/29/2022   Melanoma in situ (HCC) 06/08/2021   Contact dermatitis and eczema due to plant 05/31/2021   Healthcare maintenance 11/11/2020   Rib pain on right side  09/30/2020   Joint pain 09/19/2020   Chest congestion 06/17/2020   Joint stiffness 06/17/2020   Change in vision 06/17/2020   Unsteady gait 06/17/2020   Moderate episode of recurrent major depressive disorder (HCC) 06/17/2020   Muscle weakness 06/13/2020   Stress 11/14/2019   Breast cancer screening 11/14/2019   Major depressive disorder    Disorder of bursae of shoulder region 09/01/2019   Knee pain 09/01/2019   Low back pain 09/01/2019   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   Tremor 08/28/2019   Hyperglycemia 08/28/2019   History of COVID-19 02/20/2019   Left arm pain 01/09/2019   Neck pain 09/04/2018   Dizziness 12/19/2017   Dyspnea 09/13/2017   Left facial numbness 06/14/2015   Osteoporosis 05/13/2015   Hypercholesterolemia 02/08/2015   Left foot pain 11/04/2014   Atrophic vaginitis 09/20/2014   HSV infection 09/20/2014   Pelvic pain in female 09/20/2014   Fatigue 08/30/2014   Chest pain 08/30/2014   Abdominal pain 08/29/2014   Tendinosis 09/12/2013   Basal cell carcinoma 03/17/2012   Hyperthyroidism 03/17/2012   Fibroids 01/19/2012   BV (bacterial vaginosis) 01/05/2012   PMB (postmenopausal bleeding) 01/05/2012   Generalized anxiety disorder 03/21/2011   GERD (gastroesophageal reflux disease) 03/21/2011    ONSET DATE: ***  REFERRING DIAG: ***  THERAPY DIAG:  Balance problem  Rationale for Evaluation  and Treatment: Rehabilitation  SUBJECTIVE:                                                                                                                                                                                             SUBJECTIVE STATEMENT: Pt states she is having issues with her balance.  She has been noticing it for several months.  It is progessively worsening.  She is not falling.  But when she stands up and starts moving example: walking towards a door and then leans on the door or leans on the wall for balance; sometimes she is weaving when she amb.  She  notices she veers to one side more than the other.  She has a consult with [facility] in March 2025 for a consult.  She had seen a neurologist earlier in the long COVID recovery process.  She has also seen her cardiologist and r/o cardiac reasons.    She has not been able to exercise a lot since COVID.  She still has sort term memory issues too.  She does walk 1.5 miles- flat neighborhood; she has not done any strength training.  If she "overdoes it" she will have a flare up for 2 days.    She is not lightheaded, no dizziness noted.  Pt accompanied by: self  PERTINENT HISTORY: Jan 2021- had COVID and long COVID sx; spent 6 months lying bed, had a tough recovery   Retired Teacher, early years/pre   PAIN:  Are you having pain? {OPRCPAIN:27236}  PRECAUTIONS: {Therapy precautions:24002}  RED FLAGS: {PT Red Flags:29287}   WEIGHT BEARING RESTRICTIONS: No  FALLS: Has patient fallen in last 6 months? No, but catches herself with the wall/doorway  LIVING ENVIRONMENT: Lives with: lives with their spouse Lives in: House/apartment Stairs: yes, 3 story home- she is concerned about her balance going down the stairs- does not feel stable and leans to the R when going down the stairs; sometimes has R ankle pain while going down the stairs- has rail on the L but not on the R; has 3 steps to enter Has following equipment at home: None  PLOF: {PLOF:24004}  PATIENT GOALS: ***  OBJECTIVE:  Note: Objective measures were completed at Evaluation unless otherwise noted.  DIAGNOSTIC FINDINGS: ***  COGNITION: Overall cognitive status: {cognition:24006}   SENSATION: {sensation:27233}  COORDINATION: ***  EDEMA:  {edema:24020}  MUSCLE TONE: {LE tone:25568}  MUSCLE LENGTH: Hamstrings: Right *** deg; Left *** deg Thomas test: Right *** deg; Left *** deg  DTRs:  {DTR SITE:24025}  POSTURE: {posture:25561}  LOWER EXTREMITY ROM:     {AROM/PROM:27142}  Right Eval Left Eval  Hip  flexion    Hip  extension    Hip abduction    Hip adduction    Hip internal rotation    Hip external rotation    Knee flexion    Knee extension    Ankle dorsiflexion    Ankle plantarflexion    Ankle inversion    Ankle eversion     (Blank rows = not tested)  LOWER EXTREMITY MMT:    MMT Right Eval Left Eval  Hip flexion    Hip extension    Hip abduction    Hip adduction    Hip internal rotation    Hip external rotation    Knee flexion    Knee extension    Ankle dorsiflexion    Ankle plantarflexion    Ankle inversion    Ankle eversion    (Blank rows = not tested)  BED MOBILITY:  {Bed mobility:24027}  TRANSFERS: Assistive device utilized: {Assistive devices:23999}  Sit to stand: {Levels of assistance:24026} Stand to sit: {Levels of assistance:24026} Chair to chair: {Levels of assistance:24026} Floor: {Levels of assistance:24026}  RAMP:  Level of Assistance: {Levels of assistance:24026} Assistive device utilized: {Assistive devices:23999} Ramp Comments: ***  CURB:  Level of Assistance: {Levels of assistance:24026} Assistive device utilized: {Assistive devices:23999} Curb Comments: ***  STAIRS: Level of Assistance: {Levels of assistance:24026} Stair Negotiation Technique: {Stair Technique:27161} with {Rail Assistance:27162} Number of Stairs: ***  Height of Stairs: ***  Comments: ***  GAIT: Gait pattern: {gait characteristics:25376} Distance walked: *** Assistive device utilized: {Assistive devices:23999} Level of assistance: {Levels of assistance:24026} Comments: ***  FUNCTIONAL TESTS:  {Functional tests:24029}  PATIENT SURVEYS:  {rehab surveys:24030}                                                                                                                              TREATMENT DATE: ***    PATIENT EDUCATION: Education details: *** Person educated: {Person educated:25204} Education method: {Education Method:25205} Education comprehension: {Education  Comprehension:25206}  HOME EXERCISE PROGRAM: ***  GOALS: Goals reviewed with patient? {yes/no:20286}  SHORT TERM GOALS: Target date: ***  *** Baseline: Goal status: INITIAL  2.  *** Baseline:  Goal status: INITIAL  3.  *** Baseline:  Goal status: INITIAL  4.  *** Baseline:  Goal status: INITIAL  5.  *** Baseline:  Goal status: INITIAL  6.  *** Baseline:  Goal status: INITIAL  LONG TERM GOALS: Target date: ***  *** Baseline:  Goal status: INITIAL  2.  *** Baseline:  Goal status: INITIAL  3.  *** Baseline:  Goal status: INITIAL  4.  *** Baseline:  Goal status: INITIAL  5.  *** Baseline:  Goal status: INITIAL  6.  *** Baseline:  Goal status: INITIAL  ASSESSMENT:  CLINICAL IMPRESSION: Patient is a *** y.o. *** who was seen today for physical therapy evaluation and treatment for ***.   OBJECTIVE IMPAIRMENTS: {opptimpairments:25111}.   ACTIVITY LIMITATIONS: {activitylimitations:27494}  PARTICIPATION LIMITATIONS: {participationrestrictions:25113}  PERSONAL FACTORS: {Personal factors:25162}  are also affecting patient's functional outcome.   REHAB POTENTIAL: {rehabpotential:25112}  CLINICAL DECISION MAKING: {clinical decision making:25114}  EVALUATION COMPLEXITY: {Evaluation complexity:25115}  PLAN:  PT FREQUENCY: {rehab frequency:25116}  PT DURATION: {rehab duration:25117}  PLANNED INTERVENTIONS: {rehab planned interventions:25118::"97110-Therapeutic exercises","97530- Therapeutic [phone]- Neuromuscular re-education","97535- Self JXBJ","47829- Manual therapy"}  PLAN FOR NEXT SESSION: ***  Max Fickle, PT, DPT, OCS  Ardine Bjork, PT 03/02/2023, 4:57 PM

## 2023-03-03 NOTE — Telephone Encounter (Signed)
Patient needs PA for Repatha. 

## 2023-03-04 ENCOUNTER — Ambulatory Visit: Payer: Medicare HMO

## 2023-03-04 DIAGNOSIS — R2689 Other abnormalities of gait and mobility: Secondary | ICD-10-CM

## 2023-03-05 ENCOUNTER — Other Ambulatory Visit (HOSPITAL_COMMUNITY): Payer: Self-pay

## 2023-03-05 ENCOUNTER — Telehealth: Payer: Self-pay

## 2023-03-05 NOTE — Telephone Encounter (Signed)
 noted

## 2023-03-05 NOTE — Telephone Encounter (Signed)
 Pharmacy Patient Advocate Encounter  Received notification from [facility] that Prior Authorization for Repatha  has been APPROVED from 01/28/2023 to 01/27/2024. Unable to obtain price due to refill too soon rejection, last fill date 02/17/2023 next available fill date 03/10/2023   PA #/Case ID/Reference #: [id number]

## 2023-03-05 NOTE — Telephone Encounter (Signed)
 Pharmacy Patient Advocate Encounter   Received notification from Patient Advice Request messages that prior authorization for REPATHA  is required/requested.   Insurance verification completed.   The patient is insured through [facility] .   Per test claim: PA required; PA submitted to above mentioned insurance via CoverMyMeds Key/confirmation #/EOC [facility] Status is pending

## 2023-03-05 NOTE — Telephone Encounter (Signed)
 Noted.

## 2023-03-06 NOTE — Therapy (Signed)
 OUTPATIENT PHYSICAL THERAPY NEURO TREATMENT   Patient Name: Amber Richardson MRN: [id number] DOB:1956/01/11, 68 y.o., female Today's Date: 03/06/2023  PCP: Dr. Glendia, MD  REFERRING PROVIDER: same  END OF SESSION:  PT End of Session - 03/06/23 1253     Visit Number 2    Number of Visits 13    Date for PT Re-Evaluation 04/13/23    Authorization Type 2x/week x 6 weeks (PN recert on 04/13/23)    Authorization Time Period Medicare, PN needed at visit #10    PT Start Time 1245    PT Stop Time 1330    PT Time Calculation (min) 45 min    Equipment Utilized During Treatment Gait belt    Activity Tolerance Patient tolerated treatment well    Behavior During Therapy [facility] for tasks assessed/performed              Past Medical History:  Diagnosis Date   Allergies    Basal cell carcinoma    Fatigue 08/30/2014   Generalized anxiety disorder 03/21/2011   GERD (gastroesophageal reflux disease)    History of COVID-19 02/22/2019   HLD (hyperlipidemia)    HSV infection 09/20/2014   Hypercholesterolemia 02/08/2015   Hyperglycemia 08/28/2019   Hyperthyroidism    Major depressive disorder    Osteoporosis 05/13/2015   Pyelonephritis 1985   Scalp, arm, and leg tingling 08/28/2019   Tendinosis 09/12/2013   Rotator cuff tendinosis. Mild capsulitis.   Tremor 08/28/2019   Subtle to mild, upper extremities, left worse than right   Past Surgical History:  Procedure Laterality Date   DILATION AND CURETTAGE OF UTERUS     Miscarriage     x1   TONSILLECTOMY  1963   Patient Active Problem List   Diagnosis Date Noted   Balance problem 01/02/2023   Poison ivy dermatitis 09/26/2022   Urinary frequency 09/26/2022   Abnormal liver function tests 08/22/2022   Flank pain 08/22/2022   Cold intolerance 08/11/2022   Concussion 08/11/2022   Diarrhea 03/26/2022   Headache 01/29/2022   Melanoma in situ (HCC) 06/08/2021   Contact dermatitis and eczema due to plant 05/31/2021   Healthcare  maintenance 11/11/2020   Rib pain on right side 09/30/2020   Joint pain 09/19/2020   Chest congestion 06/17/2020   Joint stiffness 06/17/2020   Change in vision 06/17/2020   Unsteady gait 06/17/2020   Moderate episode of recurrent major depressive disorder (HCC) 06/17/2020   Muscle weakness 06/13/2020   Stress 11/14/2019   Breast cancer screening 11/14/2019   Major depressive disorder    Disorder of bursae of shoulder region 09/01/2019   Knee pain 09/01/2019   Low back pain 09/01/2019   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   Tremor 08/28/2019   Hyperglycemia 08/28/2019   History of COVID-19 02/20/2019   Left arm pain 01/09/2019   Neck pain 09/04/2018   Dizziness 12/19/2017   Dyspnea 09/13/2017   Left facial numbness 06/14/2015   Osteoporosis 05/13/2015   Hypercholesterolemia 02/08/2015   Left foot pain 11/04/2014   Atrophic vaginitis 09/20/2014   HSV infection 09/20/2014   Pelvic pain in female 09/20/2014   Fatigue 08/30/2014   Chest pain 08/30/2014   Abdominal pain 08/29/2014   Tendinosis 09/12/2013   Basal cell carcinoma 03/17/2012   Hyperthyroidism 03/17/2012   Fibroids 01/19/2012   BV (bacterial vaginosis) 01/05/2012   PMB (postmenopausal bleeding) 01/05/2012   Generalized anxiety disorder 03/21/2011   GERD (gastroesophageal reflux disease) 03/21/2011    ONSET DATE: 2021  REFERRING DIAG: R26.89 (ICD-10-CM) -  Balance problem   THERAPY DIAG:  Balance problem  Rationale for Evaluation and Treatment: Rehabilitation  SUBJECTIVE: From initial evaluation note 03/02/23                                                                                                                                                                                            SUBJECTIVE STATEMENT: Pt states she is having difficulty with her balance.  She has been noticing it for several months.  It is progessively worsening.  She feels unsteady on her feet at times while walking and has to lean into  the wall or reach for support.  She attributes the balance changes to her long COVID recovery process.  She is not falling to floor.  But when she stands up and starts moving example: walking towards a door and then leans on the door or leans on the wall for balance; sometimes she is weaving when she ambulates.  She notices she veers to one side more than the other.  She has a consult with [facility] in March 2025 for a consult.  She had seen a neurologist earlier in the long COVID recovery process.  She has also seen her cardiologist and r/o cardiac reasons.    She has not been able to exercise a lot since COVID.  She still has sort term memory issues too.  She does walk 1.5 miles- flat neighborhood; she has not done any strength training.  If she overdoes it she will have a flare up for 2 days.    She is not lightheaded, no dizziness noted.  Pt accompanied by: self  PERTINENT HISTORY: Jan 2021- had COVID and long COVID sx; spent 6 months lying bed, had a tough recovery; has had exploratory R ankle arthroscopy, no other LE  or spine injuries or surgeries.  Retired teacher, early years/pre   PAIN:  Are you having pain? No  PRECAUTIONS: Fall  RED FLAGS: None   WEIGHT BEARING RESTRICTIONS: No  FALLS: Has patient fallen in last 6 months? No, but catches herself with the wall/doorway  LIVING ENVIRONMENT: Lives with: lives with their spouse Lives in: House/apartment Stairs: yes, 3 story home- she is concerned about her balance going down the stairs- does not feel stable and leans to the R when going down the stairs; sometimes has R ankle pain while going down the stairs- has rail on the L but not on the R; has 3 steps to enter Has following equipment at home: None  PLOF: Independent  PATIENT GOALS: to improve balance  OBJECTIVE:  Note: Objective measures were completed at Evaluation unless otherwise noted. (Portions deferred  secondary to time constraints as pt arrived late to  appointment)  COGNITION: Overall cognitive status: Within functional limits for tasks assessed   SENSATION: Not tested  COORDINATION: deferred  POSTURE: rounded shoulders  LOWER EXTREMITY ROM:   functionally able to perform sit to stand independently  Active  Right Eval Left Eval  Hip flexion    Hip extension    Hip abduction    Hip adduction    Hip internal rotation    Hip external rotation    Knee flexion    Knee extension    Ankle dorsiflexion    Ankle plantarflexion    Ankle inversion    Ankle eversion     (Blank rows = not tested)  LOWER EXTREMITY MMT:    MMT Right Eval Left Eval  Hip flexion 4 4  Hip extension 4 4  Hip abduction    Hip adduction    Hip internal rotation    Hip external rotation    Knee flexion 4 4  Knee extension 4 4  Ankle dorsiflexion    Ankle plantarflexion    Ankle inversion    Ankle eversion    (Blank rows = not tested)   GAIT:  Will further assess at next visit  FUNCTIONAL TESTS:  5 times sit to stand: 15 seconds SL balance: R x 20 seconds, L x 12 seconds  PATIENT SURVEYS:  FGA to be administered at second visit                                                                                                                              TREATMENT DATE: 03/04/23 Subjective: Pt reports no falls since initial PT visit; she does notice she veers to the R if she turns directions while walking- like she keeps going right instead of straightening out; pt notes that going down her stairs is a functional scenario where she is fearful of falling; she wears transition bifocal type glasses and looks below the lens when going down stairs.  She does not go down without holding the railing, so she does not carry anything while going down the stairs so she can concentrate.  Pain: 0/10  Objective: Additional objective measures obtained today during PT session Neuro re-ed: TUG: 9 seconds (normal range)  Functional Gait Assessment (FGA)  administered: score 22/30 (indicates cut off for fall risk) Difficulty on stairs, amb with eyes closed, tandem walk, stepping over obstacle, amb with head turns horizontally>vertically  Modified Clinical Test of Sensory Interaction for Balance    (CTSIB):  CONDITION TIME STRATEGY SWAY  Eyes open, firm surface 30 seconds ankle normal  Eyes closed, firm surface 30 seconds ankle Increased sway  Eyes open, foam surface 30 seconds ankle Increased sway  Eyes closed, foam surface 30 seconds Ankle, hip, trunk Increased sway, reaches for b/l parallel bars   Stepping over obstacles: 4 laps (shoe boxes) Tandem walk fwd: 4 laps  Therapeutic Exercise: Sit to stand x5  Ascend/descend stairs: using single railing, x 4 laps  HEP instruction- see below  PATIENT EDUCATION: Education details: PT POC/tx for balance and strength deficits Person educated: Patient Education method: Explanation Education comprehension: verbalized understanding  HOME EXERCISE PROGRAM: Access Code: [email or web address] URL: [email or web address] Date: 03/04/2023 Prepared by: Vernell Reges  Exercises - Sit to Stand Without Arm Support  - 1 x daily - 7 x weekly - 4 sets - 5 reps - Tandem Walking with Counter Support  - 1 x daily - 7 x weekly - 4 sets - Single Leg Stance  - 1 x daily - 7 x weekly - 3-5 reps - 15 hold   GOALS: Goals reviewed with patient? Yes  SHORT TERM GOALS: Target date: 03/28/23  Pt will demonstrate ability to perform HEP for LE strengthening and balance exercises with proper technique/form in clinic Baseline: pt is not formally performing strength exercises currently at home Goal status: INITIAL  LONG TERM GOALS: Target date: 04/13/23  Improve LE strength 1/2 MMT grade to facilitate improved ability to ambulate down her hallway at home and descend/ascend stairs in her home without losing balance Baseline: 4/5 Goal status: INITIAL  2.  Improve FGA score to >23 indicating decreased fall  risk during ambulation Baseline: to be administered at 2nd visit; will modifiy this goal accordingly if needed Goal status: INITIAL  3.  Improve 5x STS time to <15 seconds indicating decreased fall risk in population age >66 y/o Baseline: 15 Goal status: INITIAL   ASSESSMENT:  CLINICAL IMPRESSION: Additional objective measures today support that skilled outpatient PT is appropriate to address balance deficits, LE strength deficits, and fear of falling.  Pt had the most difficulty on stairs, amb with eyes closed, tandem walk, stepping over obstacle, amb with head turns horizontally>vertically during FGA.  Demonstrates appropriate peripheral LE/trunk balance strategies, but had difficulty maintaining balance on uneven surfaces with eyes closed.  Pt relies on visual feedback for balance.  May benefit from training VOR too as she demonstrates difficulty with balance while amb and turning head.  OBJECTIVE IMPAIRMENTS: Abnormal gait, decreased activity tolerance, decreased balance, decreased endurance, decreased strength, and impaired perceived functional ability.   ACTIVITY LIMITATIONS: standing, transfers, and locomotion level  PARTICIPATION LIMITATIONS: cleaning, interpersonal relationship, shopping, community activity, and yard work  PERSONAL FACTORS: Age, Time since onset of injury/illness/exacerbation, and 1-2 comorbidities: long COVID, orthopedic hx (see above)  are also affecting patient's functional outcome.   REHAB POTENTIAL: Good  CLINICAL DECISION MAKING: Evolving/moderate complexity  EVALUATION COMPLEXITY: Moderate  PLAN:  PT FREQUENCY: 2x/week  PT DURATION: 6 weeks  PLANNED INTERVENTIONS: 97110-Therapeutic exercises, 97530- Therapeutic activity, 97112- Neuromuscular re-education, 97535- Self Care, and 02859- Manual therapy  PLAN FOR NEXT SESSION: continue LE strengthening and balance exercises- emphasizing uneven surfaces, dynamic, dual tasking activities; VOR  Vernell Reges, PT, DPT, OCS  Vernell FORBES Reges, PT 03/06/2023, 12:53 PM

## 2023-03-09 ENCOUNTER — Ambulatory Visit: Payer: Medicare HMO

## 2023-03-09 DIAGNOSIS — R2689 Other abnormalities of gait and mobility: Secondary | ICD-10-CM | POA: Diagnosis not present

## 2023-03-09 NOTE — Therapy (Signed)
 OUTPATIENT PHYSICAL THERAPY NEURO TREATMENT   Patient Name: Amber Richardson MRN: [id number] DOB:12-Jun-1955, 68 y.o., female Today's Date: 03/09/2023  PCP: Dr. Geralyn Knee, MD  REFERRING PROVIDER: same  END OF SESSION:  PT End of Session - 03/09/23 1704     Visit Number 3    Number of Visits 13    Date for PT Re-Evaluation 04/13/23    Authorization Type 2x/week x 6 weeks (PN recert on 04/13/23)    Authorization Time Period Medicare, PN needed at visit #10    PT Start Time 1415    PT Stop Time 1500    PT Time Calculation (min) 45 min    Equipment Utilized During Treatment Gait belt    Activity Tolerance Patient tolerated treatment well    Behavior During Therapy [facility] for tasks assessed/performed              Past Medical History:  Diagnosis Date   Allergies    Basal cell carcinoma    Fatigue 08/30/2014   Generalized anxiety disorder 03/21/2011   GERD (gastroesophageal reflux disease)    History of COVID-19 02/22/2019   HLD (hyperlipidemia)    HSV infection 09/20/2014   Hypercholesterolemia 02/08/2015   Hyperglycemia 08/28/2019   Hyperthyroidism    Major depressive disorder    Osteoporosis 05/13/2015   Pyelonephritis 1985   Scalp, arm, and leg tingling 08/28/2019   Tendinosis 09/12/2013   Rotator cuff tendinosis. Mild capsulitis.   Tremor 08/28/2019   Subtle to mild, upper extremities, left worse than right   Past Surgical History:  Procedure Laterality Date   DILATION AND CURETTAGE OF UTERUS     Miscarriage     x1   TONSILLECTOMY  1963   Patient Active Problem List   Diagnosis Date Noted   Balance problem 01/02/2023   Poison ivy dermatitis 09/26/2022   Urinary frequency 09/26/2022   Abnormal liver function tests 08/22/2022   Flank pain 08/22/2022   Cold intolerance 08/11/2022   Concussion 08/11/2022   Diarrhea 03/26/2022   Headache 01/29/2022   Melanoma in situ (HCC) 06/08/2021   Contact dermatitis and eczema due to plant 05/31/2021   Healthcare  maintenance 11/11/2020   Rib pain on right side 09/30/2020   Joint pain 09/19/2020   Chest congestion 06/17/2020   Joint stiffness 06/17/2020   Change in vision 06/17/2020   Unsteady gait 06/17/2020   Moderate episode of recurrent major depressive disorder (HCC) 06/17/2020   Muscle weakness 06/13/2020   Stress 11/14/2019   Breast cancer screening 11/14/2019   Major depressive disorder    Disorder of bursae of shoulder region 09/01/2019   Knee pain 09/01/2019   Low back pain 09/01/2019   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   Tremor 08/28/2019   Hyperglycemia 08/28/2019   History of COVID-19 02/20/2019   Left arm pain 01/09/2019   Neck pain 09/04/2018   Dizziness 12/19/2017   Dyspnea 09/13/2017   Left facial numbness 06/14/2015   Osteoporosis 05/13/2015   Hypercholesterolemia 02/08/2015   Left foot pain 11/04/2014   Atrophic vaginitis 09/20/2014   HSV infection 09/20/2014   Pelvic pain in female 09/20/2014   Fatigue 08/30/2014   Chest pain 08/30/2014   Abdominal pain 08/29/2014   Tendinosis 09/12/2013   Basal cell carcinoma 03/17/2012   Hyperthyroidism 03/17/2012   Fibroids 01/19/2012   BV (bacterial vaginosis) 01/05/2012   PMB (postmenopausal bleeding) 01/05/2012   Generalized anxiety disorder 03/21/2011   GERD (gastroesophageal reflux disease) 03/21/2011    ONSET DATE: 2021  REFERRING DIAG: R26.89 (ICD-10-CM) -  Balance problem   THERAPY DIAG:  Balance problem  Rationale for Evaluation and Treatment: Rehabilitation  SUBJECTIVE: From initial evaluation note 03/02/23                                                                                                                                                                                            SUBJECTIVE STATEMENT: Pt states she is having difficulty with her balance.  She has been noticing it for several months.  It is progessively worsening.  She feels unsteady on her feet at times while walking and has to lean into  the wall or reach for support.  She attributes the balance changes to her long COVID recovery process.  She is not falling to floor.  But when she stands up and starts moving example: walking towards a door and then leans on the door or leans on the wall for balance; sometimes she is weaving when she ambulates.  She notices she veers to one side more than the other.  She has a consult with [facility] neurolog[facility] in March 2025 for a consult.  She had seen a neurologist earlier in the long COVID recovery process.  She has also seen her cardiologist and r/o cardiac reasons.    She has not been able to exercise a lot since COVID.  She still has sort term memory issues too.  She does walk 1.5 miles- [place] neighborhood; she has not done any strength training.  If she "overdoes it" she will have a flare up for 2 days.    She is not lightheaded, no dizziness noted.  Pt accompanied by: self  PERTINENT HISTORY: Jan 2021- had COVID and long COVID sx; spent 6 months lying bed, had a tough recovery; has had exploratory R ankle arthroscopy, no other LE  or spine injuries or surgeries.  Retired Teacher, early years/pre   PAIN:  Are you having pain? No  PRECAUTIONS: Fall  RED FLAGS: None   WEIGHT BEARING RESTRICTIONS: No  FALLS: Has patient fallen in last 6 months? No, but catches herself with the wall/doorway  LIVING ENVIRONMENT: Lives with: lives with their spouse Lives in: House/apartment Stairs: yes, 3 story home- she is concerned about her balance going down the stairs- does not feel stable and leans to the R when going down the stairs; sometimes has R ankle pain while going down the stairs- has rail on the L but not on the R; has 3 steps to enter Has following equipment at home: None  PLOF: Independent  PATIENT GOALS: to improve balance  OBJECTIVE:  Note: Objective measures were completed at Evaluation unless otherwise noted. (Portions deferred  secondary to time constraints as pt arrived late to  appointment)  COGNITION: Overall cognitive status: Within functional limits for tasks assessed   SENSATION: Not tested  COORDINATION: deferred  POSTURE: rounded shoulders  LOWER EXTREMITY ROM:   functionally able to perform sit to stand independently  Active  Right Eval Left Eval  Hip flexion    Hip extension    Hip abduction    Hip adduction    Hip internal rotation    Hip external rotation    Knee flexion    Knee extension    Ankle dorsiflexion    Ankle plantarflexion    Ankle inversion    Ankle eversion     (Blank rows = not tested)  LOWER EXTREMITY MMT:    MMT Right Eval Left Eval  Hip flexion 4 4  Hip extension 4 4  Hip abduction    Hip adduction    Hip internal rotation    Hip external rotation    Knee flexion 4 4  Knee extension 4 4  Ankle dorsiflexion    Ankle plantarflexion    Ankle inversion    Ankle eversion    (Blank rows = not tested)   GAIT:  Will further assess at next visit  FUNCTIONAL TESTS:  5 times sit to stand: 15 seconds SL balance: R x 20 seconds, L x 12 seconds  PATIENT SURVEYS:  FGA to be administered at second visit                                                                                                                              TREATMENT DATE: 03/09/23 Subjective: Pt reports no falls since last session.  She has been taking care of a family health emergency with her mom in the hospital; has not been able to work on PT HEP.  She was a little sore after last session, soreness only lasted a day.    Pain: 0/10  Objective: From 03/04/23 Neuro re-ed: TUG: 9 seconds (normal range)  Functional Gait Assessment (FGA) administered: score 22/30 (indicates cut off for fall risk) Difficulty on stairs, amb with eyes closed, tandem walk, stepping over obstacle, amb with head turns horizontally>vertically  Modified Clinical Test of Sensory Interaction for Balance    (CTSIB):  CONDITION TIME STRATEGY SWAY  Eyes open, firm  surface 30 seconds ankle normal  Eyes closed, firm surface 30 seconds ankle Increased sway  Eyes open, foam surface 30 seconds ankle Increased sway  Eyes closed, foam surface 30 seconds Ankle, hip, trunk Increased sway, reaches for b/l parallel bars    Tandem walk fwd: 4 laps in parallel bars Feet together airex: eyes open x 1 min, eyes closed 3x 15 seconds, head turns R/L x20, up/down x20 Amb 70 ft in hallway x 2 with head turns R and L Pivot turn R/L and stop and stand after Amb 70 ft in hallway x 2 with vertical head turns up/down Pivot turn R/L  and stop and stand after Feet together gaze stabilization VOR- horizontal 3x 30 seconds, vertical 3x30 sec   Therapeutic Exercise: Sit to stand x5 Ascend/descend stairs: using single railing, x 4 laps- not today Amb in hallway 2 laps with PT gait belt, supervision Stepping over obstacles: 4 laps in parallel bars- boxes/foam rollers  HEP instruction- see below  PATIENT EDUCATION: Education details: PT POC/tx for balance and strength deficits Person educated: Patient Education method: Explanation Education comprehension: verbalized understanding  HOME EXERCISE PROGRAM: Access Code: [email or web address] URL: [email or web address] Date: 03/09/2023 Prepared by: Lucrecia Sables  Exercises - Sit to Stand Without Arm Support  - 1 x daily - 7 x weekly - 4 sets - 5 reps - Tandem Walking with Counter Support  - 1 x daily - 7 x weekly - 4 sets - Single Leg Stance  - 1 x daily - 7 x weekly - 3-5 reps - 15 hold - Seated Gaze Stabilization with Head Nod  - 1 x daily - 7 x weekly - 30 reps - 30 sec hold - Seated Gaze Stabilization with Head Rotation  - 1 x daily - 7 x weekly - 3 reps - 30 sec hold   GOALS: Goals reviewed with patient? Yes  SHORT TERM GOALS: Target date: 03/28/23  Pt will demonstrate ability to perform HEP for LE strengthening and balance exercises with proper technique/form in clinic Baseline: pt is not formally performing  strength exercises currently at home Goal status: INITIAL  LONG TERM GOALS: Target date: 04/13/23  Improve LE strength 1/2 MMT grade to facilitate improved ability to ambulate down her hallway at home and descend/ascend stairs in her home without losing balance Baseline: 4/5 Goal status: INITIAL  2.  Improve FGA score to >23 indicating decreased fall risk during ambulation Baseline: to be administered at 2nd visit; will modifiy this goal accordingly if needed Goal status: INITIAL  3.  Improve 5x STS time to <15 seconds indicating decreased fall risk in population age >74 y/o Baseline: 15 Goal status: INITIAL   ASSESSMENT:  CLINICAL IMPRESSION: Pt was very challenged with narrow base of support/tandem gait in parallel bars, had to step off the line occasionally and reach for parallel bars intermittently today.  Demonstrated good LE proprioception with clearing various height obstacles and was steady during this activity.  Had to frequently step outside of base of support while amb with horizonal head turns.  She notes "swimmy headed" feeling with standing gaze stabilization.  Overall she should continue to benefit from skilled PT to address balance, strength, gait deficits to improve safety and reduce fall risk during amb.  OBJECTIVE IMPAIRMENTS: Abnormal gait, decreased activity tolerance, decreased balance, decreased endurance, decreased strength, and impaired perceived functional ability.   ACTIVITY LIMITATIONS: standing, transfers, and locomotion level  PARTICIPATION LIMITATIONS: cleaning, interpersonal relationship, shopping, community activity, and yard work  PERSONAL FACTORS: Age, Time since onset of injury/illness/exacerbation, and 1-2 comorbidities: long COVID, orthopedic hx (see above)  are also affecting patient's functional outcome.   REHAB POTENTIAL: Good  CLINICAL DECISION MAKING: Evolving/moderate complexity  EVALUATION COMPLEXITY: Moderate  PLAN:  PT FREQUENCY:  2x/week  PT DURATION: 6 weeks  PLANNED INTERVENTIONS: 97110-Therapeutic exercises, 97530- Therapeutic activity, 97112- Neuromuscular re-education, 97535- Self Care, and 47829- Manual therapy  PLAN FOR NEXT SESSION: continue LE strengthening and balance exercises- emphasizing uneven surfaces, dynamic, dual tasking activities; VOR  Lucrecia Sables, PT, DPT, OCS  Kin Penner, PT 03/09/2023, 5:05 PM

## 2023-03-16 ENCOUNTER — Ambulatory Visit: Payer: Medicare HMO

## 2023-03-18 ENCOUNTER — Ambulatory Visit: Payer: Medicare HMO

## 2023-03-23 ENCOUNTER — Ambulatory Visit: Payer: Medicare HMO

## 2023-03-23 DIAGNOSIS — R2689 Other abnormalities of gait and mobility: Secondary | ICD-10-CM | POA: Diagnosis not present

## 2023-03-23 NOTE — Therapy (Signed)
 OUTPATIENT PHYSICAL THERAPY NEURO TREATMENT   Patient Name: Amber Richardson MRN: [id number] DOB:[birth year], 68 y.o., female Today's Date: 03/23/2023  PCP: Dr. Lorin Picket, MD  REFERRING PROVIDER: same  END OF SESSION:  PT End of Session - 03/23/23 1201     Visit Number 4    Number of Visits 13    Date for PT Re-Evaluation 04/13/23    Authorization Type 2x/week x 6 weeks (PN recert on 04/13/23)    Authorization Time Period Medicare, PN needed at visit #10    PT Start Time 1200    PT Stop Time 1245    PT Time Calculation (min) 45 min    Equipment Utilized During Treatment Gait belt    Activity Tolerance Patient tolerated treatment well    Behavior During Therapy [facility] for tasks assessed/performed              Past Medical History:  Diagnosis Date   Allergies    Basal cell carcinoma    Fatigue 08/30/2014   Generalized anxiety disorder 03/21/2011   GERD (gastroesophageal reflux disease)    History of COVID-19 02/22/2019   HLD (hyperlipidemia)    HSV infection 09/20/2014   Hypercholesterolemia 02/08/2015   Hyperglycemia 08/28/2019   Hyperthyroidism    Major depressive disorder    Osteoporosis 05/13/2015   Pyelonephritis 1985   Scalp, arm, and leg tingling 08/28/2019   Tendinosis 09/12/2013   Rotator cuff tendinosis. Mild capsulitis.   Tremor 08/28/2019   Subtle to mild, upper extremities, left worse than right   Past Surgical History:  Procedure Laterality Date   DILATION AND CURETTAGE OF UTERUS     Miscarriage     x1   TONSILLECTOMY  1963   Patient Active Problem List   Diagnosis Date Noted   Balance problem 01/02/2023   Poison ivy dermatitis 09/26/2022   Urinary frequency 09/26/2022   Abnormal liver function tests 08/22/2022   Flank pain 08/22/2022   Cold intolerance 08/11/2022   Concussion 08/11/2022   Diarrhea 03/26/2022   Headache 01/29/2022   Melanoma in situ (HCC) 06/08/2021   Contact dermatitis and eczema due to plant 05/31/2021   Healthcare  maintenance 11/11/2020   Rib pain on right side 09/30/2020   Joint pain 09/19/2020   Chest congestion 06/17/2020   Joint stiffness 06/17/2020   Change in vision 06/17/2020   Unsteady gait 06/17/2020   Moderate episode of recurrent major depressive disorder (HCC) 06/17/2020   Muscle weakness 06/13/2020   Stress 11/14/2019   Breast cancer screening 11/14/2019   Major depressive disorder    Disorder of bursae of shoulder region 09/01/2019   Knee pain 09/01/2019   Low back pain 09/01/2019   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   Tremor 08/28/2019   Hyperglycemia 08/28/2019   History of COVID-19 02/20/2019   Left arm pain 01/09/2019   Neck pain 09/04/2018   Dizziness 12/19/2017   Dyspnea 09/13/2017   Left facial numbness 06/14/2015   Osteoporosis 05/13/2015   Hypercholesterolemia 02/08/2015   Left foot pain 11/04/2014   Atrophic vaginitis 09/20/2014   HSV infection 09/20/2014   Pelvic pain in female 09/20/2014   Fatigue 08/30/2014   Chest pain 08/30/2014   Abdominal pain 08/29/2014   Tendinosis 09/12/2013   Basal cell carcinoma 03/17/2012   Hyperthyroidism 03/17/2012   Fibroids 01/19/2012   BV (bacterial vaginosis) 01/05/2012   PMB (postmenopausal bleeding) 01/05/2012   Generalized anxiety disorder 03/21/2011   GERD (gastroesophageal reflux disease) 03/21/2011    ONSET DATE: 2021  REFERRING DIAG: R26.89 (ICD-10-CM) -  Balance problem   THERAPY DIAG:  Balance problem  Rationale for Evaluation and Treatment: Rehabilitation  SUBJECTIVE: From initial evaluation note 03/02/23                                                                                                                                                                                            SUBJECTIVE STATEMENT: Pt states she is having difficulty with her balance.  She has been noticing it for several months.  It is progessively worsening.  She feels unsteady on her feet at times while walking and has to lean into  the wall or reach for support.  She attributes the balance changes to her long COVID recovery process.  She is not falling to floor.  But when she stands up and starts moving example: walking towards a door and then leans on the door or leans on the wall for balance; sometimes she is weaving when she ambulates.  She notices she veers to one side more than the other.  She has a consult with [facility] in March 2025 for a consult.  She had seen a neurologist earlier in the long COVID recovery process.  She has also seen her cardiologist and r/o cardiac reasons.    She has not been able to exercise a lot since COVID.  She still has sort term memory issues too.  She does walk 1.5 miles- [place] neighborhood; she has not done any strength training.  If she "overdoes it" she will have a flare up for 2 days.    She is not lightheaded, no dizziness noted.  Pt accompanied by: self  PERTINENT HISTORY: Jan 2021- had COVID and long COVID sx; spent 6 months lying bed, had a tough recovery; has had exploratory R ankle arthroscopy, no other LE  or spine injuries or surgeries.  Retired Teacher, early years/pre   PAIN:  Are you having pain? No  PRECAUTIONS: Fall  RED FLAGS: None   WEIGHT BEARING RESTRICTIONS: No  FALLS: Has patient fallen in last 6 months? No, but catches herself with the wall/doorway  LIVING ENVIRONMENT: Lives with: lives with their spouse Lives in: House/apartment Stairs: yes, 3 story home- she is concerned about her balance going down the stairs- does not feel stable and leans to the R when going down the stairs; sometimes has R ankle pain while going down the stairs- has rail on the L but not on the R; has 3 steps to enter Has following equipment at home: None  PLOF: Independent  PATIENT GOALS: to improve balance  OBJECTIVE:  Note: Objective measures were completed at Evaluation unless otherwise noted. (Portions deferred  secondary to time constraints as pt arrived late to  appointment)  COGNITION: Overall cognitive status: Within functional limits for tasks assessed   SENSATION: Not tested  COORDINATION: deferred  POSTURE: rounded shoulders  LOWER EXTREMITY ROM:   functionally able to perform sit to stand independently  Active  Right Eval Left Eval  Hip flexion    Hip extension    Hip abduction    Hip adduction    Hip internal rotation    Hip external rotation    Knee flexion    Knee extension    Ankle dorsiflexion    Ankle plantarflexion    Ankle inversion    Ankle eversion     (Blank rows = not tested)  LOWER EXTREMITY MMT:    MMT Right Eval Left Eval  Hip flexion 4 4  Hip extension 4 4  Hip abduction    Hip adduction    Hip internal rotation    Hip external rotation    Knee flexion 4 4  Knee extension 4 4  Ankle dorsiflexion    Ankle plantarflexion    Ankle inversion    Ankle eversion    (Blank rows = not tested)   GAIT:  Will further assess at next visit  FUNCTIONAL TESTS:  5 times sit to stand: 15 seconds SL balance: R x 20 seconds, L x 12 seconds  PATIENT SURVEYS:  FGA to be administered at second visit                                                                                                                              TREATMENT DATE: 03/23/23 Subjective: Pt reports she has been focusing a lot on caregiver duties taking care of her Mother who is having health issues; this has been taking a lot of time and she has not been able to work on her HEP as often.  Still feeling off balance; no falls to report.    Pain: 0/10  Objective: From 03/04/23 Neuro re-ed: TUG: 9 seconds (normal range)  Functional Gait Assessment (FGA) administered: score 22/30 (indicates cut off for fall risk) Difficulty on stairs, amb with eyes closed, tandem walk, stepping over obstacle, amb with head turns horizontally>vertically  Modified Clinical Test of Sensory Interaction for Balance    (CTSIB):  CONDITION TIME STRATEGY SWAY   Eyes open, firm surface 30 seconds ankle normal  Eyes closed, firm surface 30 seconds ankle Increased sway  Eyes open, foam surface 30 seconds ankle Increased sway  Eyes closed, foam surface 30 seconds Ankle, hip, trunk Increased sway, reaches for b/l parallel bars   Tandem stance: 3x 30 seconds ea foot in front in parallel bars  Tandem walk fwd: 4 laps in parallel bars Feet together airex: eyes open x 1 min, eyes closed 3x 15 seconds, head turns R/L x20, up/down x20- not today Amb 70 ft in hallway x 2 with head turns R and L Pivot turn R/L and stop  and stand after Amb 70 ft in hallway x 2 with vertical head turns up/down Pivot turn R/L and stop and stand after Feet together gaze stabilization VOR- horizontal 3x 30 seconds, vertical 3x30 sec Standing on bosu: 2 min, intermittent hand hold on parallel bars Front step ups onto bosu: x10 ea with pause at the top, find balance without UE support Alternating foot taps on 6 inch step: 45 seconds, 3 rounds    Therapeutic Exercise: Sit to stand x5 Ascend/descend stairs: using single railing, x 4 laps- not today Amb in hallway 2 laps with PT gait belt, supervision Stepping over obstacles: 4 laps in parallel bars- boxes/foam rollers- not today Squats holding medicine ball: 8 lbs 2x10 (chair behind her) Heel raises: 2x10 Toe raises: 2x10  HEP instruction- see below  PATIENT EDUCATION: Education details: PT POC/tx for balance and strength deficits Person educated: Patient Education method: Explanation Education comprehension: verbalized understanding  HOME EXERCISE PROGRAM: Access Code: [email or web address] URL: [email or web address] Date: 03/09/2023 Prepared by: Max Fickle  Exercises - Sit to Stand Without Arm Support  - 1 x daily - 7 x weekly - 4 sets - 5 reps - Tandem Walking with Counter Support  - 1 x daily - 7 x weekly - 4 sets - Single Leg Stance  - 1 x daily - 7 x weekly - 3-5 reps - 15 hold - Seated Gaze  Stabilization with Head Nod  - 1 x daily - 7 x weekly - 30 reps - 30 sec hold - Seated Gaze Stabilization with Head Rotation  - 1 x daily - 7 x weekly - 3 reps - 30 sec hold   GOALS: Goals reviewed with patient? Yes  SHORT TERM GOALS: Target date: 03/28/23  Pt will demonstrate ability to perform HEP for LE strengthening and balance exercises with proper technique/form in clinic Baseline: pt is not formally performing strength exercises currently at home Goal status: INITIAL  LONG TERM GOALS: Target date: 04/13/23  Improve LE strength 1/2 MMT grade to facilitate improved ability to ambulate down her hallway at home and descend/ascend stairs in her home without losing balance Baseline: 4/5 Goal status: INITIAL  2.  Improve FGA score to >23 indicating decreased fall risk during ambulation Baseline: to be administered at 2nd visit; will modifiy this goal accordingly if needed Goal status: INITIAL  3.  Improve 5x STS time to <15 seconds indicating decreased fall risk in population age >66 y/o Baseline: 15 Goal status: INITIAL   ASSESSMENT:  CLINICAL IMPRESSION: Pt remains quite challenged with tandem static stance- leans trunk heavily to R and L and suddenly.  Demonstrated good LE proprioception with alternating foot taps on step today.  Demonstrated appropriate ankle/hip/trunk strategies for balance while standing on bosu today.  Had to frequently step outside of base of support or stepped close to midline while amb with horizonal head turns.  She notes "swimmy headed" internal feeling with standing gaze stabilization, no visual or "room spinning" type observations noted.  Overall she should continue to benefit from skilled PT to address balance, strength, gait deficits to improve safety and reduce fall risk during amb.  OBJECTIVE IMPAIRMENTS: Abnormal gait, decreased activity tolerance, decreased balance, decreased endurance, decreased strength, and impaired perceived functional ability.    ACTIVITY LIMITATIONS: standing, transfers, and locomotion level  PARTICIPATION LIMITATIONS: cleaning, interpersonal relationship, shopping, community activity, and yard work  PERSONAL FACTORS: Age, Time since onset of injury/illness/exacerbation, and 1-2 comorbidities: long COVID, orthopedic hx (see above)  are also affecting patient's functional  outcome.   REHAB POTENTIAL: Good  CLINICAL DECISION MAKING: Evolving/moderate complexity  EVALUATION COMPLEXITY: Moderate  PLAN:  PT FREQUENCY: 2x/week  PT DURATION: 6 weeks  PLANNED INTERVENTIONS: 97110-Therapeutic exercises, 97530- Therapeutic activity, 97112- Neuromuscular re-education, 97535- Self Care, and [id number]- Manual therapy  PLAN FOR NEXT SESSION: continue LE strengthening and balance exercises- emphasizing uneven surfaces, dynamic, dual tasking activities; VOR  Max Fickle, PT, DPT, OCS  Ardine Bjork, PT 03/23/2023, 12:01 PM

## 2023-03-25 ENCOUNTER — Ambulatory Visit: Payer: Medicare HMO

## 2023-03-25 DIAGNOSIS — R2689 Other abnormalities of gait and mobility: Secondary | ICD-10-CM

## 2023-03-25 NOTE — Therapy (Signed)
 OUTPATIENT PHYSICAL THERAPY NEURO TREATMENT   Patient Name: Amber Richardson MRN: [id number] DOB:12-10-1955, 68 y.o., female Today's Date: 03/25/2023  PCP: Dr. Lorin Picket, MD  REFERRING PROVIDER: same  END OF SESSION:  PT End of Session - 03/25/23 1506     Visit Number 5    Number of Visits 13    Date for PT Re-Evaluation 04/13/23    Authorization Type 2x/week x 6 weeks (PN recert on 04/13/23)    Authorization Time Period Medicare, PN needed at visit #10    PT Start Time 1505    PT Stop Time 1550    PT Time Calculation (min) 45 min    Equipment Utilized During Treatment Gait belt    Activity Tolerance Patient tolerated treatment well    Behavior During Therapy [facility] for tasks assessed/performed              Past Medical History:  Diagnosis Date   Allergies    Basal cell carcinoma    Fatigue 08/30/2014   Generalized anxiety disorder 03/21/2011   GERD (gastroesophageal reflux disease)    History of COVID-19 02/22/2019   HLD (hyperlipidemia)    HSV infection 09/20/2014   Hypercholesterolemia 02/08/2015   Hyperglycemia 08/28/2019   Hyperthyroidism    Major depressive disorder    Osteoporosis 05/13/2015   Pyelonephritis 1985   Scalp, arm, and leg tingling 08/28/2019   Tendinosis 09/12/2013   Rotator cuff tendinosis. Mild capsulitis.   Tremor 08/28/2019   Subtle to mild, upper extremities, left worse than right   Past Surgical History:  Procedure Laterality Date   DILATION AND CURETTAGE OF UTERUS     Miscarriage     x1   TONSILLECTOMY  1963   Patient Active Problem List   Diagnosis Date Noted   Balance problem 01/02/2023   Poison ivy dermatitis 09/26/2022   Urinary frequency 09/26/2022   Abnormal liver function tests 08/22/2022   Flank pain 08/22/2022   Cold intolerance 08/11/2022   Concussion 08/11/2022   Diarrhea 03/26/2022   Headache 01/29/2022   Melanoma in situ (HCC) 06/08/2021   Contact dermatitis and eczema due to plant 05/31/2021   Healthcare  maintenance 11/11/2020   Rib pain on right side 09/30/2020   Joint pain 09/19/2020   Chest congestion 06/17/2020   Joint stiffness 06/17/2020   Change in vision 06/17/2020   Unsteady gait 06/17/2020   Moderate episode of recurrent major depressive disorder (HCC) 06/17/2020   Muscle weakness 06/13/2020   Stress 11/14/2019   Breast cancer screening 11/14/2019   Major depressive disorder    Disorder of bursae of shoulder region 09/01/2019   Knee pain 09/01/2019   Low back pain 09/01/2019   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   Tremor 08/28/2019   Hyperglycemia 08/28/2019   History of COVID-19 02/20/2019   Left arm pain 01/09/2019   Neck pain 09/04/2018   Dizziness 12/19/2017   Dyspnea 09/13/2017   Left facial numbness 06/14/2015   Osteoporosis 05/13/2015   Hypercholesterolemia 02/08/2015   Left foot pain 11/04/2014   Atrophic vaginitis 09/20/2014   HSV infection 09/20/2014   Pelvic pain in female 09/20/2014   Fatigue 08/30/2014   Chest pain 08/30/2014   Abdominal pain 08/29/2014   Tendinosis 09/12/2013   Basal cell carcinoma 03/17/2012   Hyperthyroidism 03/17/2012   Fibroids 01/19/2012   BV (bacterial vaginosis) 01/05/2012   PMB (postmenopausal bleeding) 01/05/2012   Generalized anxiety disorder 03/21/2011   GERD (gastroesophageal reflux disease) 03/21/2011    ONSET DATE: 2021  REFERRING DIAG: R26.89 (ICD-10-CM) -  Balance problem   THERAPY DIAG:  Balance problem  Rationale for Evaluation and Treatment: Rehabilitation  SUBJECTIVE: From initial evaluation note 03/02/23                                                                                                                                                                                            SUBJECTIVE STATEMENT: Pt states she is having difficulty with her balance.  She has been noticing it for several months.  It is progessively worsening.  She feels unsteady on her feet at times while walking and has to lean into  the wall or reach for support.  She attributes the balance changes to her long COVID recovery process.  She is not falling to floor.  But when she stands up and starts moving example: walking towards a door and then leans on the door or leans on the wall for balance; sometimes she is weaving when she ambulates.  She notices she veers to one side more than the other.  She has a consult with [facility] in March 2025 for a consult.  She had seen a neurologist earlier in the long COVID recovery process.  She has also seen her cardiologist and r/o cardiac reasons.    She has not been able to exercise a lot since COVID.  She still has sort term memory issues too.  She does walk 1.5 miles- [place] neighborhood; she has not done any strength training.  If she "overdoes it" she will have a flare up for 2 days.    She is not lightheaded, no dizziness noted.  Pt accompanied by: self  PERTINENT HISTORY: Jan 2021- had COVID and long COVID sx; spent 6 months lying bed, had a tough recovery; has had exploratory R ankle arthroscopy, no other LE  or spine injuries or surgeries.  Retired Teacher, early years/pre   PAIN:  Are you having pain? No  PRECAUTIONS: Fall  RED FLAGS: None   WEIGHT BEARING RESTRICTIONS: No  FALLS: Has patient fallen in last 6 months? No, but catches herself with the wall/doorway  LIVING ENVIRONMENT: Lives with: lives with their spouse Lives in: House/apartment Stairs: yes, 3 story home- she is concerned about her balance going down the stairs- does not feel stable and leans to the R when going down the stairs; sometimes has R ankle pain while going down the stairs- has rail on the L but not on the R; has 3 steps to enter Has following equipment at home: None  PLOF: Independent  PATIENT GOALS: to improve balance  OBJECTIVE:  Note: Objective measures were completed at Evaluation unless otherwise noted. (Portions deferred  secondary to time constraints as pt arrived late to  appointment)  COGNITION: Overall cognitive status: Within functional limits for tasks assessed   SENSATION: Not tested  COORDINATION: deferred  POSTURE: rounded shoulders  LOWER EXTREMITY ROM:   functionally able to perform sit to stand independently  Active  Right Eval Left Eval  Hip flexion    Hip extension    Hip abduction    Hip adduction    Hip internal rotation    Hip external rotation    Knee flexion    Knee extension    Ankle dorsiflexion    Ankle plantarflexion    Ankle inversion    Ankle eversion     (Blank rows = not tested)  LOWER EXTREMITY MMT:    MMT Right Eval Left Eval  Hip flexion 4 4  Hip extension 4 4  Hip abduction    Hip adduction    Hip internal rotation    Hip external rotation    Knee flexion 4 4  Knee extension 4 4  Ankle dorsiflexion    Ankle plantarflexion    Ankle inversion    Ankle eversion    (Blank rows = not tested)   GAIT:  Will further assess at next visit  FUNCTIONAL TESTS:  5 times sit to stand: 15 seconds SL balance: R x 20 seconds, L x 12 seconds  PATIENT SURVEYS:  FGA to be administered at second visit                                                                                                                              TREATMENT DATE: 03/25/23 Subjective: Pt reports she has been focusing a lot on caregiver duties taking care of her Mother who is having health issues; also took brother to an apt today; had PT for shoulder yesterday.  Still feeling off balance; no falls to report.  Felt swimmy headed when getting up from low sitting at her mom's.  Typically her BP is low.    Pain: 0/10  Objective: From 03/04/23 Neuro re-ed: TUG: 9 seconds (normal range)  Functional Gait Assessment (FGA) administered: score 22/30 (indicates cut off for fall risk) Difficulty on stairs, amb with eyes closed, tandem walk, stepping over obstacle, amb with head turns horizontally>vertically  Modified Clinical Test of Sensory  Interaction for Balance    (CTSIB):  CONDITION TIME STRATEGY SWAY  Eyes open, firm surface 30 seconds ankle normal  Eyes closed, firm surface 30 seconds ankle Increased sway  Eyes open, foam surface 30 seconds ankle Increased sway  Eyes closed, foam surface 30 seconds Ankle, hip, trunk Increased sway, reaches for b/l parallel bars   Tandem stance: 3x 30 seconds ea foot in front in parallel bars  Tandem walk fwd: 4 laps in parallel bars- not today Feet together airex: eyes open x 1 min, eyes closed 3x 15 seconds, head turns R/L x20, up/down x20- not today Amb 70 ft in hallway x  2 with head turns R and L- not today Pivot turn R/L and stop and stand after Amb 70 ft in hallway x 2 with vertical head turns up/down- not today Pivot turn R/L and stop and stand after Feet together gaze stabilization VOR- horizontal 3x 30 seconds, vertical 3x30 sec- HEP Feet together on airex: head turns R/L and up/down 30 seconds x 3 ea Standing on bosu: 2 min, intermittent hand hold on parallel bars Front step ups onto bosu: x10 ea with pause at the top, find balance without UE support Alternating foot taps on 6 inch step: 45 seconds, 3 rounds (outside on curb, without railing) Amb forward/reverse on grass surface with PT supervision gait belt 4 x20 ft   Therapeutic Exercise: Sit to stand x5 Ascend/descend stairs: using single railing, x 4 laps- not today Amb in hallway 2 laps with PT gait belt, supervision- not today Stepping over obstacles: 4 laps in parallel bars- boxes/foam rollers- not today Squats holding medicine ball: 8 lbs 2x10 (chair behind her) Heel raises: 2x10 Toe raises: 2x10 Front step ups on curb: LRRL, RLLR x10 ea (better coordination R LE leading)  HEP instruction- see below  PATIENT EDUCATION: Education details: PT POC/tx for balance and strength deficits Person educated: Patient Education method: Explanation Education comprehension: verbalized understanding  HOME EXERCISE  PROGRAM: Access Code: [email or web address] URL: [email or web address] Date: 03/09/2023 Prepared by: Max Fickle  Exercises - Sit to Stand Without Arm Support  - 1 x daily - 7 x weekly - 4 sets - 5 reps - Tandem Walking with Counter Support  - 1 x daily - 7 x weekly - 4 sets - Single Leg Stance  - 1 x daily - 7 x weekly - 3-5 reps - 15 hold - Seated Gaze Stabilization with Head Nod  - 1 x daily - 7 x weekly - 30 reps - 30 sec hold - Seated Gaze Stabilization with Head Rotation  - 1 x daily - 7 x weekly - 3 reps - 30 sec hold   GOALS: Goals reviewed with patient? Yes  SHORT TERM GOALS: Target date: 03/28/23  Pt will demonstrate ability to perform HEP for LE strengthening and balance exercises with proper technique/form in clinic Baseline: pt is not formally performing strength exercises currently at home Goal status: INITIAL  LONG TERM GOALS: Target date: 04/13/23  Improve LE strength 1/2 MMT grade to facilitate improved ability to ambulate down her hallway at home and descend/ascend stairs in her home without losing balance Baseline: 4/5 Goal status: INITIAL  2.  Improve FGA score to >23 indicating decreased fall risk during ambulation Baseline: to be administered at 2nd visit; will modifiy this goal accordingly if needed Goal status: INITIAL  3.  Improve 5x STS time to <15 seconds indicating decreased fall risk in population age >16 y/o Baseline: 15 Goal status: INITIAL   ASSESSMENT:  CLINICAL IMPRESSION: Pt remains quite challenged with tandem static stance- leans trunk heavily to R and L and suddenly.  Demonstrated better control when starting with b/l UE support, keeping eyes focused on visual target in front of her and gradually reducing UE support.  Demonstrated appropriate ankle/hip/trunk strategies for balance while standing on bosu today.  Had difficulty with coordination of L foot during front step ups on curb today.  Overall she should continue to benefit from  skilled PT to address balance, strength, gait deficits to improve safety and reduce fall risk during amb.  OBJECTIVE IMPAIRMENTS: Abnormal gait, decreased activity tolerance, decreased balance, decreased endurance,  decreased strength, and impaired perceived functional ability.   ACTIVITY LIMITATIONS: standing, transfers, and locomotion level  PARTICIPATION LIMITATIONS: cleaning, interpersonal relationship, shopping, community activity, and yard work  PERSONAL FACTORS: Age, Time since onset of injury/illness/exacerbation, and 1-2 comorbidities: long COVID, orthopedic hx (see above)  are also affecting patient's functional outcome.   REHAB POTENTIAL: Good  CLINICAL DECISION MAKING: Evolving/moderate complexity  EVALUATION COMPLEXITY: Moderate  PLAN:  PT FREQUENCY: 2x/week  PT DURATION: 6 weeks  PLANNED INTERVENTIONS: 97110-Therapeutic exercises, 97530- Therapeutic activity, 97112- Neuromuscular re-education, 97535- Self Care, and [id number]- Manual therapy  PLAN FOR NEXT SESSION: continue LE strengthening and balance exercises- emphasizing uneven surfaces, dynamic, dual tasking activities; VOR  Max Fickle, PT, DPT, OCS  Ardine Bjork, PT 03/25/2023, 3:07 PM

## 2023-03-30 ENCOUNTER — Ambulatory Visit: Payer: Medicare HMO | Attending: Internal Medicine

## 2023-03-30 DIAGNOSIS — R2689 Other abnormalities of gait and mobility: Secondary | ICD-10-CM | POA: Insufficient documentation

## 2023-03-30 NOTE — Therapy (Signed)
 OUTPATIENT PHYSICAL THERAPY NEURO TREATMENT   Patient Name: Amber Richardson MRN: [id number] DOB:12-13-1955, 68 y.o., female Today's Date: 03/30/2023  PCP: Dr. Lorin Picket, MD  REFERRING PROVIDER: same  END OF SESSION:  PT End of Session - 03/30/23 1254     Visit Number 6    Number of Visits 13    Date for PT Re-Evaluation 04/13/23    Authorization Type 2x/week x 6 weeks (PN recert on 04/13/23)    Authorization Time Period Medicare, PN needed at visit #10    PT Start Time 1245    PT Stop Time 1330    PT Time Calculation (min) 45 min    Equipment Utilized During Treatment Gait belt    Activity Tolerance Patient tolerated treatment well    Behavior During Therapy [facility] for tasks assessed/performed              Past Medical History:  Diagnosis Date   Allergies    Basal cell carcinoma    Fatigue 08/30/2014   Generalized anxiety disorder 03/21/2011   GERD (gastroesophageal reflux disease)    History of COVID-19 02/22/2019   HLD (hyperlipidemia)    HSV infection 09/20/2014   Hypercholesterolemia 02/08/2015   Hyperglycemia 08/28/2019   Hyperthyroidism    Major depressive disorder    Osteoporosis 05/13/2015   Pyelonephritis 1985   Scalp, arm, and leg tingling 08/28/2019   Tendinosis 09/12/2013   Rotator cuff tendinosis. Mild capsulitis.   Tremor 08/28/2019   Subtle to mild, upper extremities, left worse than right   Past Surgical History:  Procedure Laterality Date   DILATION AND CURETTAGE OF UTERUS     Miscarriage     x1   TONSILLECTOMY  1963   Patient Active Problem List   Diagnosis Date Noted   Balance problem 01/02/2023   Poison ivy dermatitis 09/26/2022   Urinary frequency 09/26/2022   Abnormal liver function tests 08/22/2022   Flank pain 08/22/2022   Cold intolerance 08/11/2022   Concussion 08/11/2022   Diarrhea 03/26/2022   Headache 01/29/2022   Melanoma in situ (HCC) 06/08/2021   Contact dermatitis and eczema due to plant 05/31/2021   Healthcare  maintenance 11/11/2020   Rib pain on right side 09/30/2020   Joint pain 09/19/2020   Chest congestion 06/17/2020   Joint stiffness 06/17/2020   Change in vision 06/17/2020   Unsteady gait 06/17/2020   Moderate episode of recurrent major depressive disorder (HCC) 06/17/2020   Muscle weakness 06/13/2020   Stress 11/14/2019   Breast cancer screening 11/14/2019   Major depressive disorder    Disorder of bursae of shoulder region 09/01/2019   Knee pain 09/01/2019   Low back pain 09/01/2019   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   Tremor 08/28/2019   Hyperglycemia 08/28/2019   History of COVID-19 02/20/2019   Left arm pain 01/09/2019   Neck pain 09/04/2018   Dizziness 12/19/2017   Dyspnea 09/13/2017   Left facial numbness 06/14/2015   Osteoporosis 05/13/2015   Hypercholesterolemia 02/08/2015   Left foot pain 11/04/2014   Atrophic vaginitis 09/20/2014   HSV infection 09/20/2014   Pelvic pain in female 09/20/2014   Fatigue 08/30/2014   Chest pain 08/30/2014   Abdominal pain 08/29/2014   Tendinosis 09/12/2013   Basal cell carcinoma 03/17/2012   Hyperthyroidism 03/17/2012   Fibroids 01/19/2012   BV (bacterial vaginosis) 01/05/2012   PMB (postmenopausal bleeding) 01/05/2012   Generalized anxiety disorder 03/21/2011   GERD (gastroesophageal reflux disease) 03/21/2011    ONSET DATE: 2021  REFERRING DIAG: R26.89 (ICD-10-CM) -  Balance problem   THERAPY DIAG:  Balance problem  Rationale for Evaluation and Treatment: Rehabilitation  SUBJECTIVE: From initial evaluation note 03/02/23                                                                                                                                                                                            SUBJECTIVE STATEMENT: Pt states she is having difficulty with her balance.  She has been noticing it for several months.  It is progessively worsening.  She feels unsteady on her feet at times while walking and has to lean into  the wall or reach for support.  She attributes the balance changes to her long COVID recovery process.  She is not falling to floor.  But when she stands up and starts moving example: walking towards a door and then leans on the door or leans on the wall for balance; sometimes she is weaving when she ambulates.  She notices she veers to one side more than the other.  She has a consult with [facility] neurolog[facility] in March 2025 for a consult.  She had seen a neurologist earlier in the long COVID recovery process.  She has also seen her cardiologist and r/o cardiac reasons.    She has not been able to exercise a lot since COVID.  She still has sort term memory issues too.  She does walk 1.5 miles- [place] neighborhood; she has not done any strength training.  If she "overdoes it" she will have a flare up for 2 days.    She is not lightheaded, no dizziness noted.  Pt accompanied by: self  PERTINENT HISTORY: Jan 2021- had COVID and long COVID sx; spent 6 months lying bed, had a tough recovery; has had exploratory R ankle arthroscopy, no other LE  or spine injuries or surgeries.  Retired Teacher, early years/pre   PAIN:  Are you having pain? No  PRECAUTIONS: Fall  RED FLAGS: None   WEIGHT BEARING RESTRICTIONS: No  FALLS: Has patient fallen in last 6 months? No, but catches herself with the wall/doorway  LIVING ENVIRONMENT: Lives with: lives with their spouse Lives in: House/apartment Stairs: yes, 3 story home- she is concerned about her balance going down the stairs- does not feel stable and leans to the R when going down the stairs; sometimes has R ankle pain while going down the stairs- has rail on the L but not on the R; has 3 steps to enter Has following equipment at home: None  PLOF: Independent  PATIENT GOALS: to improve balance  OBJECTIVE:  Note: Objective measures were completed at Evaluation unless otherwise noted. (Portions deferred  secondary to time constraints as pt arrived late to  appointment)  COGNITION: Overall cognitive status: Within functional limits for tasks assessed   SENSATION: Not tested  COORDINATION: deferred  POSTURE: rounded shoulders  LOWER EXTREMITY ROM:   functionally able to perform sit to stand independently  Active  Right Eval Left Eval  Hip flexion    Hip extension    Hip abduction    Hip adduction    Hip internal rotation    Hip external rotation    Knee flexion    Knee extension    Ankle dorsiflexion    Ankle plantarflexion    Ankle inversion    Ankle eversion     (Blank rows = not tested)  LOWER EXTREMITY MMT:    MMT Right Eval Left Eval  Hip flexion 4 4  Hip extension 4 4  Hip abduction    Hip adduction    Hip internal rotation    Hip external rotation    Knee flexion 4 4  Knee extension 4 4  Ankle dorsiflexion    Ankle plantarflexion    Ankle inversion    Ankle eversion    (Blank rows = not tested)   GAIT:  Will further assess at next visit  FUNCTIONAL TESTS:  5 times sit to stand: 15 seconds SL balance: R x 20 seconds, L x 12 seconds  PATIENT SURVEYS:  FGA to be administered at second visit                                                                                                                              TREATMENT DATE: 03/30/23/25 Subjective: Pt reports she continues to feel a little "swimmy headed" inside her head, nothing is moving in her visual field externally, no vertigo or dizziness.  No falls to report.  Had a few episodes of feeling off balance with large shift to the side but able to use arms for support.  Pain: 0/10  Objective: From 03/04/23 Neuro re-ed: TUG: 9 seconds (normal range)  Functional Gait Assessment (FGA) administered: score 22/30 (indicates cut off for fall risk) Difficulty on stairs, amb with eyes closed, tandem walk, stepping over obstacle, amb with head turns horizontally>vertically  Modified Clinical Test of Sensory Interaction for Balance     (CTSIB):  CONDITION TIME STRATEGY SWAY  Eyes open, firm surface 30 seconds ankle normal  Eyes closed, firm surface 30 seconds ankle Increased sway  Eyes open, foam surface 30 seconds ankle Increased sway  Eyes closed, foam surface 30 seconds Ankle, hip, trunk Increased sway, reaches for b/l parallel bars   Tandem stance: 3x 30 seconds ea foot in front in parallel bars- not today Tandem walk fwd: 4 laps in parallel bars- not today Feet together airex: eyes open x 1 min, eyes closed 3x 15 seconds, head turns R/L x20, up/down x20- not today Amb 70 ft in hallway x 2 with head turns R and L- not today Pivot  turn R/L and stop and stand after Amb 70 ft in hallway x 2 with vertical head turns up/down- not today Pivot turn R/L and stop and stand after Feet together gaze stabilization VOR- horizontal 3x 30 seconds, vertical 3x30 sec- HEP Feet together on airex: head turns R/L and up/down 30 seconds x 3 ea Standing on bosu: 2 min, intermittent hand hold on parallel bars Front step ups onto bosu: x10 ea with pause at the top, find balance without UE support Alternating foot taps on 6 inch step: 45 seconds, 3 rounds (outside on curb, without railing) Amb forward/reverse on grass surface with PT supervision gait belt 4 x20 ft Blue ladder on floor: forward alternating marches 4 laps, lateral high knees 2 laps, diagonal forward alternating SLS outside ladder 4 laps (PT close supervision)   Therapeutic Exercise: Sit to stand x5 Ascend/descend stairs: using single railing, x 4 laps- not today Amb in hallway 2 laps with PT gait belt, supervision- not today Stepping over obstacles: 4 laps in parallel bars- boxes/foam rollers- not today Squats holding medicine ball: 8 lbs 2x10 (chair behind her) Heel raises: 2x10 Toe raises: 2x10 Front step ups on 6 inch step: LRRL, RLLR x10 ea (better coordination R LE leading) Standing hamstring curl: 5# 2x12 Seated knee extension: 5# 2x12   HEP instruction- see  below  PATIENT EDUCATION: Education details: PT POC/tx for balance and strength deficits Person educated: Patient Education method: Explanation Education comprehension: verbalized understanding  HOME EXERCISE PROGRAM: Access Code: [email or web address] URL: [email or web address] Date: 03/09/2023 Prepared by: Max Fickle  Exercises - Sit to Stand Without Arm Support  - 1 x daily - 7 x weekly - 4 sets - 5 reps - Tandem Walking with Counter Support  - 1 x daily - 7 x weekly - 4 sets - Single Leg Stance  - 1 x daily - 7 x weekly - 3-5 reps - 15 hold - Seated Gaze Stabilization with Head Nod  - 1 x daily - 7 x weekly - 30 reps - 30 sec hold - Seated Gaze Stabilization with Head Rotation  - 1 x daily - 7 x weekly - 3 reps - 30 sec hold   GOALS: Goals reviewed with patient? Yes  SHORT TERM GOALS: Target date: 03/28/23  Pt will demonstrate ability to perform HEP for LE strengthening and balance exercises with proper technique/form in clinic Baseline: pt is not formally performing strength exercises currently at home Goal status: INITIAL  LONG TERM GOALS: Target date: 04/13/23  Improve LE strength 1/2 MMT grade to facilitate improved ability to ambulate down her hallway at home and descend/ascend stairs in her home without losing balance Baseline: 4/5 Goal status: INITIAL  2.  Improve FGA score to >23 indicating decreased fall risk during ambulation Baseline: to be administered at 2nd visit; will modifiy this goal accordingly if needed Goal status: INITIAL  3.  Improve 5x STS time to <15 seconds indicating decreased fall risk in population age >55 y/o Baseline: 15 Goal status: INITIAL   ASSESSMENT:  CLINICAL IMPRESSION: Pt remains quite challenged with dynamic balance activities today- had multiple sudden, but inconsistent lateral losses of balance requiring either stepping or significant trunk lean strategy to bring COM over single leg today.  Demonstrated appropriate  ankle/hip/trunk strategies for balance while standing on bosu today.  Was challenged appropriately with LE strengthening progression today.  Overall she should continue to benefit from skilled PT to address balance, strength, gait deficits to improve safety and reduce fall risk during  amb.  OBJECTIVE IMPAIRMENTS: Abnormal gait, decreased activity tolerance, decreased balance, decreased endurance, decreased strength, and impaired perceived functional ability.   ACTIVITY LIMITATIONS: standing, transfers, and locomotion level  PARTICIPATION LIMITATIONS: cleaning, interpersonal relationship, shopping, community activity, and yard work  PERSONAL FACTORS: Age, Time since onset of injury/illness/exacerbation, and 1-2 comorbidities: long COVID, orthopedic hx (see above)  are also affecting patient's functional outcome.   REHAB POTENTIAL: Good  CLINICAL DECISION MAKING: Evolving/moderate complexity  EVALUATION COMPLEXITY: Moderate  PLAN:  PT FREQUENCY: 2x/week  PT DURATION: 6 weeks  PLANNED INTERVENTIONS: 97110-Therapeutic exercises, 97530- Therapeutic activity, 97112- Neuromuscular re-education, 97535- Self Care, and [id number]- Manual therapy  PLAN FOR NEXT SESSION: continue LE strengthening and balance exercises- emphasizing uneven surfaces, dynamic, dual tasking activities; VOR  Max Fickle, PT, DPT, OCS  Ardine Bjork, PT 03/30/2023, 12:55 PM

## 2023-04-01 ENCOUNTER — Ambulatory Visit: Payer: Medicare HMO

## 2023-04-01 DIAGNOSIS — R2689 Other abnormalities of gait and mobility: Secondary | ICD-10-CM | POA: Diagnosis not present

## 2023-04-01 NOTE — Therapy (Signed)
 OUTPATIENT PHYSICAL THERAPY NEURO TREATMENT   Patient Name: Amber Richardson MRN: [id number] DOB:23-Dec-1955, 68 y.o., female Today's Date: 04/01/2023  PCP: Dr. Lorin Picket, MD  REFERRING PROVIDER: same  END OF SESSION:  PT End of Session - 04/01/23 1252     Visit Number 7    Number of Visits 13    Date for PT Re-Evaluation 04/13/23    Authorization Type 2x/week x 6 weeks (PN recert on 04/13/23)    Authorization Time Period Medicare, PN needed at visit #10    PT Start Time 1250    PT Stop Time 1335    PT Time Calculation (min) 45 min    Equipment Utilized During Treatment Gait belt    Activity Tolerance Patient tolerated treatment well    Behavior During Therapy [facility] for tasks assessed/performed              Past Medical History:  Diagnosis Date   Allergies    Basal cell carcinoma    Fatigue 08/30/2014   Generalized anxiety disorder 03/21/2011   GERD (gastroesophageal reflux disease)    History of COVID-19 02/22/2019   HLD (hyperlipidemia)    HSV infection 09/20/2014   Hypercholesterolemia 02/08/2015   Hyperglycemia 08/28/2019   Hyperthyroidism    Major depressive disorder    Osteoporosis 05/13/2015   Pyelonephritis 1985   Scalp, arm, and leg tingling 08/28/2019   Tendinosis 09/12/2013   Rotator cuff tendinosis. Mild capsulitis.   Tremor 08/28/2019   Subtle to mild, upper extremities, left worse than right   Past Surgical History:  Procedure Laterality Date   DILATION AND CURETTAGE OF UTERUS     Miscarriage     x1   TONSILLECTOMY  1963   Patient Active Problem List   Diagnosis Date Noted   Balance problem 01/02/2023   Poison ivy dermatitis 09/26/2022   Urinary frequency 09/26/2022   Abnormal liver function tests 08/22/2022   Flank pain 08/22/2022   Cold intolerance 08/11/2022   Concussion 08/11/2022   Diarrhea 03/26/2022   Headache 01/29/2022   Melanoma in situ (HCC) 06/08/2021   Contact dermatitis and eczema due to plant 05/31/2021   Healthcare  maintenance 11/11/2020   Rib pain on right side 09/30/2020   Joint pain 09/19/2020   Chest congestion 06/17/2020   Joint stiffness 06/17/2020   Change in vision 06/17/2020   Unsteady gait 06/17/2020   Moderate episode of recurrent major depressive disorder (HCC) 06/17/2020   Muscle weakness 06/13/2020   Stress 11/14/2019   Breast cancer screening 11/14/2019   Major depressive disorder    Disorder of bursae of shoulder region 09/01/2019   Knee pain 09/01/2019   Low back pain 09/01/2019   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   Tremor 08/28/2019   Hyperglycemia 08/28/2019   History of COVID-19 02/20/2019   Left arm pain 01/09/2019   Neck pain 09/04/2018   Dizziness 12/19/2017   Dyspnea 09/13/2017   Left facial numbness 06/14/2015   Osteoporosis 05/13/2015   Hypercholesterolemia 02/08/2015   Left foot pain 11/04/2014   Atrophic vaginitis 09/20/2014   HSV infection 09/20/2014   Pelvic pain in female 09/20/2014   Fatigue 08/30/2014   Chest pain 08/30/2014   Abdominal pain 08/29/2014   Tendinosis 09/12/2013   Basal cell carcinoma 03/17/2012   Hyperthyroidism 03/17/2012   Fibroids 01/19/2012   BV (bacterial vaginosis) 01/05/2012   PMB (postmenopausal bleeding) 01/05/2012   Generalized anxiety disorder 03/21/2011   GERD (gastroesophageal reflux disease) 03/21/2011    ONSET DATE: 2021  REFERRING DIAG: R26.89 (ICD-10-CM) -  Balance problem   THERAPY DIAG:  Balance problem  Rationale for Evaluation and Treatment: Rehabilitation  SUBJECTIVE: From initial evaluation note 03/02/23                                                                                                                                                                                            SUBJECTIVE STATEMENT: Pt states she is having difficulty with her balance.  She has been noticing it for several months.  It is progessively worsening.  She feels unsteady on her feet at times while walking and has to lean into  the wall or reach for support.  She attributes the balance changes to her long COVID recovery process.  She is not falling to floor.  But when she stands up and starts moving example: walking towards a door and then leans on the door or leans on the wall for balance; sometimes she is weaving when she ambulates.  She notices she veers to one side more than the other.  She has a consult with [facility] neurology in March 2025 for a consult.  She had seen a neurologist earlier in the long COVID recovery process.  She has also seen her cardiologist and r/o cardiac reasons.    She has not been able to exercise a lot since COVID.  She still has sort term memory issues too.  She does walk 1.5 miles- [place] neighborhood; she has not done any strength training.  If she "overdoes it" she will have a flare up for 2 days.    She is not lightheaded, no dizziness noted.  Pt accompanied by: self  PERTINENT HISTORY: Jan 2021- had COVID and long COVID sx; spent 6 months lying bed, had a tough recovery; has had exploratory R ankle arthroscopy, no other LE  or spine injuries or surgeries.  Retired Teacher, early years/pre   PAIN:  Are you having pain? No  PRECAUTIONS: Fall  RED FLAGS: None   WEIGHT BEARING RESTRICTIONS: No  FALLS: Has patient fallen in last 6 months? No, but catches herself with the wall/doorway  LIVING ENVIRONMENT: Lives with: lives with their spouse Lives in: House/apartment Stairs: yes, 3 story home- she is concerned about her balance going down the stairs- does not feel stable and leans to the R when going down the stairs; sometimes has R ankle pain while going down the stairs- has rail on the L but not on the R; has 3 steps to enter Has following equipment at home: None  PLOF: Independent  PATIENT GOALS: to improve balance  OBJECTIVE:  Note: Objective measures were completed at Evaluation unless otherwise noted. (Portions deferred  secondary to time constraints as pt arrived late to  appointment)  COGNITION: Overall cognitive status: Within functional limits for tasks assessed   SENSATION: Not tested  COORDINATION: deferred  POSTURE: rounded shoulders  LOWER EXTREMITY ROM:   functionally able to perform sit to stand independently  Active  Right Eval Left Eval  Hip flexion    Hip extension    Hip abduction    Hip adduction    Hip internal rotation    Hip external rotation    Knee flexion    Knee extension    Ankle dorsiflexion    Ankle plantarflexion    Ankle inversion    Ankle eversion     (Blank rows = not tested)  LOWER EXTREMITY MMT:    MMT Right Eval Left Eval  Hip flexion 4 4  Hip extension 4 4  Hip abduction    Hip adduction    Hip internal rotation    Hip external rotation    Knee flexion 4 4  Knee extension 4 4  Ankle dorsiflexion    Ankle plantarflexion    Ankle inversion    Ankle eversion    (Blank rows = not tested)   GAIT:  Will further assess at next visit  FUNCTIONAL TESTS:  5 times sit to stand: 15 seconds SL balance: R x 20 seconds, L x 12 seconds  PATIENT SURVEYS:  FGA to be administered at second visit                                                                                                                              TREATMENT DATE: 04/01/23 Subjective: Pt reports she continues to feel a little "swimmy headed" inside her head, nothing is moving in her visual field externally, no vertigo or dizziness.  No falls to report.  She noticed she still leaned into the wall while going down stairs at her home; but overall felt a bit safer this week.  Consult with neurology is 05/13/23 [facility].  Pain: 0/10  Objective: From 03/04/23 Neuro re-ed: TUG: 9 seconds (normal range)  Functional Gait Assessment (FGA) administered: score 22/30 (indicates cut off for fall risk) Difficulty on stairs, amb with eyes closed, tandem walk, stepping over obstacle, amb with head turns horizontally>vertically  Modified Clinical  Test of Sensory Interaction for Balance    (CTSIB):  CONDITION TIME STRATEGY SWAY  Eyes open, firm surface 30 seconds ankle normal  Eyes closed, firm surface 30 seconds ankle Increased sway  Eyes open, foam surface 30 seconds ankle Increased sway  Eyes closed, foam surface 30 seconds Ankle, hip, trunk Increased sway, reaches for b/l parallel bars   Tandem stance: 3x 30 seconds ea foot in front in parallel bars- not today Tandem walk fwd: 4 laps in parallel bars- not today Feet together airex: eyes open x 1 min, eyes closed 3x 15 seconds, head turns R/L x20, up/down x20- not today Amb 70 ft in hallway x  2 with head turns R and L Pivot turn R/L and stop and stand after Amb 70 ft in hallway x 2 with vertical head turns up/down Pivot turn R/L and stop and stand after Walking 70 ft in hallway with self vertical ball toss x 2 laps Walking 70 ft in hallway with PT/pt lateral ball toss x 2 laps Walking 70 ft in hallway with cognitive dual task count down by 2s and up by 3's Feet together gaze stabilization VOR- horizontal 3x 30 seconds, vertical 3x30 sec- HEP Feet together on airex: head turns R/L and up/down 30 seconds x 3 ea- not today Standing on bosu: 2 min, intermittent hand hold on parallel bars Front step ups onto bosu: x10 ea with pause at the top, find balance without UE support Alternating foot taps on 6 inch step: 45 seconds, 3 rounds (outside on curb, without railing)- not today Amb forward/reverse on grass surface with PT supervision gait belt 4 x20 ft- not today Blue ladder on floor: forward alternating marches 4 laps, lateral high knees 2 laps, diagonal forward alternating SLS outside ladder 4 laps (PT close supervision)   Therapeutic Exercise: Sit to stand 2x5 Ascend/descend stairs: using single railing, x 4 laps- not today Stepping over obstacles: 4 laps in parallel bars- boxes/foam rollers- not today Squats holding medicine ball: 8 lbs 2x10 (chair behind her) Heel raises:  2x10 Toe raises: 2x10 Front step ups on 6 inch step: LRRL, RLLR x10 ea (better coordination R LE leading) Standing hamstring curl: 5# 2x12 Seated knee extension: 5# 2x12   HEP instruction- see below  PATIENT EDUCATION: Education details: PT POC/tx for balance and strength deficits Person educated: Patient Education method: Explanation Education comprehension: verbalized understanding  HOME EXERCISE PROGRAM: Access Code: [email or web address] URL: [email or web address] Date: 03/09/2023 Prepared by: Max Fickle  Exercises - Sit to Stand Without Arm Support  - 1 x daily - 7 x weekly - 4 sets - 5 reps - Tandem Walking with Counter Support  - 1 x daily - 7 x weekly - 4 sets - Single Leg Stance  - 1 x daily - 7 x weekly - 3-5 reps - 15 hold - Seated Gaze Stabilization with Head Nod  - 1 x daily - 7 x weekly - 30 reps - 30 sec hold - Seated Gaze Stabilization with Head Rotation  - 1 x daily - 7 x weekly - 3 reps - 30 sec hold   GOALS: Goals reviewed with patient? Yes  SHORT TERM GOALS: Target date: 03/28/23  Pt will demonstrate ability to perform HEP for LE strengthening and balance exercises with proper technique/form in clinic Baseline: pt is not formally performing strength exercises currently at home Goal status: INITIAL  LONG TERM GOALS: Target date: 04/13/23  Improve LE strength 1/2 MMT grade to facilitate improved ability to ambulate down her hallway at home and descend/ascend stairs in her home without losing balance Baseline: 4/5 Goal status: INITIAL  2.  Improve FGA score to >23 indicating decreased fall risk during ambulation Baseline: to be administered at 2nd visit; will modifiy this goal accordingly if needed Goal status: INITIAL  3.  Improve 5x STS time to <15 seconds indicating decreased fall risk in population age >46 y/o Baseline: 15 Goal status: INITIAL   ASSESSMENT:  CLINICAL IMPRESSION: Pt remains quite challenged with dynamic balance activities  today in hallway.  Frequently amb with feet crossing to midline while amb with head turns.  Did well with cognitive dual task combined with amb, no  loss of balance or severe gait deviations observed. Demonstrated appropriate ankle/hip/trunk strategies for balance while standing on bosu today.  Remains appropriately challenged appropriately with LE strengthening today.  Overall she should continue to benefit from skilled PT to address balance, strength, gait deficits to improve safety and reduce fall risk during amb.  OBJECTIVE IMPAIRMENTS: Abnormal gait, decreased activity tolerance, decreased balance, decreased endurance, decreased strength, and impaired perceived functional ability.   ACTIVITY LIMITATIONS: standing, transfers, and locomotion level  PARTICIPATION LIMITATIONS: cleaning, interpersonal relationship, shopping, community activity, and yard work  PERSONAL FACTORS: Age, Time since onset of injury/illness/exacerbation, and 1-2 comorbidities: long COVID, orthopedic hx (see above)  are also affecting patient's functional outcome.   REHAB POTENTIAL: Good  CLINICAL DECISION MAKING: Evolving/moderate complexity  EVALUATION COMPLEXITY: Moderate  PLAN:  PT FREQUENCY: 2x/week  PT DURATION: 6 weeks  PLANNED INTERVENTIONS: 97110-Therapeutic exercises, 97530- Therapeutic activity, 97112- Neuromuscular re-education, 97535- Self Care, and [id number]- Manual therapy  PLAN FOR NEXT SESSION: continue LE strengthening and balance exercises- emphasizing uneven surfaces, dynamic, dual tasking activities; VOR  Max Fickle, PT, DPT, OCS  Ardine Bjork, PT 04/01/2023, 3:33 PM

## 2023-04-06 ENCOUNTER — Ambulatory Visit: Payer: Medicare HMO

## 2023-04-06 DIAGNOSIS — R2689 Other abnormalities of gait and mobility: Secondary | ICD-10-CM

## 2023-04-06 NOTE — Therapy (Signed)
 OUTPATIENT PHYSICAL THERAPY NEURO TREATMENT   Patient Name: Amber Richardson MRN: [id number] DOB:12/28/1955, 68 y.o., female Today's Date: 04/06/2023  PCP: Dr. Lorin Picket, MD  REFERRING PROVIDER: same  END OF SESSION:  PT End of Session - 04/06/23 1258     Visit Number 8    Number of Visits 13    Date for PT Re-Evaluation 04/13/23    Authorization Type 2x/week x 6 weeks (PN recert on 04/13/23)    Authorization Time Period Medicare, PN needed at visit #10    PT Start Time 1255    PT Stop Time 1335    PT Time Calculation (min) 40 min    Equipment Utilized During Treatment Gait belt    Activity Tolerance Patient tolerated treatment well    Behavior During Therapy [facility] for tasks assessed/performed              Past Medical History:  Diagnosis Date   Allergies    Basal cell carcinoma    Fatigue 08/30/2014   Generalized anxiety disorder 03/21/2011   GERD (gastroesophageal reflux disease)    History of COVID-19 02/22/2019   HLD (hyperlipidemia)    HSV infection 09/20/2014   Hypercholesterolemia 02/08/2015   Hyperglycemia 08/28/2019   Hyperthyroidism    Major depressive disorder    Osteoporosis 05/13/2015   Pyelonephritis 1985   Scalp, arm, and leg tingling 08/28/2019   Tendinosis 09/12/2013   Rotator cuff tendinosis. Mild capsulitis.   Tremor 08/28/2019   Subtle to mild, upper extremities, left worse than right   Past Surgical History:  Procedure Laterality Date   DILATION AND CURETTAGE OF UTERUS     Miscarriage     x1   TONSILLECTOMY  1963   Patient Active Problem List   Diagnosis Date Noted   Balance problem 01/02/2023   Poison ivy dermatitis 09/26/2022   Urinary frequency 09/26/2022   Abnormal liver function tests 08/22/2022   Flank pain 08/22/2022   Cold intolerance 08/11/2022   Concussion 08/11/2022   Diarrhea 03/26/2022   Headache 01/29/2022   Melanoma in situ (HCC) 06/08/2021   Contact dermatitis and eczema due to plant 05/31/2021   Healthcare  maintenance 11/11/2020   Rib pain on right side 09/30/2020   Joint pain 09/19/2020   Chest congestion 06/17/2020   Joint stiffness 06/17/2020   Change in vision 06/17/2020   Unsteady gait 06/17/2020   Moderate episode of recurrent major depressive disorder (HCC) 06/17/2020   Muscle weakness 06/13/2020   Stress 11/14/2019   Breast cancer screening 11/14/2019   Major depressive disorder    Disorder of bursae of shoulder region 09/01/2019   Knee pain 09/01/2019   Low back pain 09/01/2019   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   Tremor 08/28/2019   Hyperglycemia 08/28/2019   History of COVID-19 02/20/2019   Left arm pain 01/09/2019   Neck pain 09/04/2018   Dizziness 12/19/2017   Dyspnea 09/13/2017   Left facial numbness 06/14/2015   Osteoporosis 05/13/2015   Hypercholesterolemia 02/08/2015   Left foot pain 11/04/2014   Atrophic vaginitis 09/20/2014   HSV infection 09/20/2014   Pelvic pain in female 09/20/2014   Fatigue 08/30/2014   Chest pain 08/30/2014   Abdominal pain 08/29/2014   Tendinosis 09/12/2013   Basal cell carcinoma 03/17/2012   Hyperthyroidism 03/17/2012   Fibroids 01/19/2012   BV (bacterial vaginosis) 01/05/2012   PMB (postmenopausal bleeding) 01/05/2012   Generalized anxiety disorder 03/21/2011   GERD (gastroesophageal reflux disease) 03/21/2011    ONSET DATE: 2021  REFERRING DIAG: R26.89 (ICD-10-CM) -  Balance problem   THERAPY DIAG:  Balance problem  Rationale for Evaluation and Treatment: Rehabilitation  SUBJECTIVE: From initial evaluation note 03/02/23                                                                                                                                                                                            SUBJECTIVE STATEMENT: Pt states she is having difficulty with her balance.  She has been noticing it for several months.  It is progessively worsening.  She feels unsteady on her feet at times while walking and has to lean into  the wall or reach for support.  She attributes the balance changes to her long COVID recovery process.  She is not falling to floor.  But when she stands up and starts moving example: walking towards a door and then leans on the door or leans on the wall for balance; sometimes she is weaving when she ambulates.  She notices she veers to one side more than the other.  She has a consult with [facility] neurology in March 2025 for a consult.  She had seen a neurologist earlier in the long COVID recovery process.  She has also seen her cardiologist and r/o cardiac reasons.    She has not been able to exercise a lot since COVID.  She still has sort term memory issues too.  She does walk 1.5 miles- flat neighborhood; she has not done any strength training.  If she "overdoes it" she will have a flare up for 2 days.    She is not lightheaded, no dizziness noted.  Pt accompanied by: self  PERTINENT HISTORY: Jan 2021- had COVID and long COVID sx; spent 6 months lying bed, had a tough recovery; has had exploratory R ankle arthroscopy, no other LE  or spine injuries or surgeries.  Retired Teacher, early years/pre   PAIN:  Are you having pain? No  PRECAUTIONS: Fall  RED FLAGS: None   WEIGHT BEARING RESTRICTIONS: No  FALLS: Has patient fallen in last 6 months? No, but catches herself with the wall/doorway  LIVING ENVIRONMENT: Lives with: lives with their spouse Lives in: House/apartment Stairs: yes, 3 story home- she is concerned about her balance going down the stairs- does not feel stable and leans to the R when going down the stairs; sometimes has R ankle pain while going down the stairs- has rail on the L but not on the R; has 3 steps to enter Has following equipment at home: None  PLOF: Independent  PATIENT GOALS: to improve balance  OBJECTIVE:  Note: Objective measures were completed at Evaluation unless otherwise noted. (Portions deferred  secondary to time constraints as pt arrived late to  appointment)  COGNITION: Overall cognitive status: Within functional limits for tasks assessed   SENSATION: Not tested  COORDINATION: deferred  POSTURE: rounded shoulders  LOWER EXTREMITY ROM:   functionally able to perform sit to stand independently  Active  Right Eval Left Eval  Hip flexion    Hip extension    Hip abduction    Hip adduction    Hip internal rotation    Hip external rotation    Knee flexion    Knee extension    Ankle dorsiflexion    Ankle plantarflexion    Ankle inversion    Ankle eversion     (Blank rows = not tested)  LOWER EXTREMITY MMT:    MMT Right Eval Left Eval  Hip flexion 4 4  Hip extension 4 4  Hip abduction    Hip adduction    Hip internal rotation    Hip external rotation    Knee flexion 4 4  Knee extension 4 4  Ankle dorsiflexion    Ankle plantarflexion    Ankle inversion    Ankle eversion    (Blank rows = not tested)   GAIT:  Will further assess at next visit  FUNCTIONAL TESTS:  5 times sit to stand: 15 seconds SL balance: R x 20 seconds, L x 12 seconds  PATIENT SURVEYS:  FGA to be administered at second visit                                                                                                                              TREATMENT DATE: 04/06/23 Subjective: Pt reports she continues to feel a little "swimmy headed" inside her head, nothing is moving in her visual field externally, no vertigo or dizziness.  No falls to report.  She reports working on [employer].  Feels off balance when turning directions- less than before or turning head quickly.  Consult with neurology is 05/13/23 at [facility].  Pain: 0/10  Objective: From 03/04/23 Neuro re-ed: TUG: 9 seconds (normal range)  Functional Gait Assessment (FGA) administered: score 22/30 (indicates cut off for fall risk) Difficulty on stairs, amb with eyes closed, tandem walk, stepping over obstacle, amb with head turns horizontally>vertically  Modified Clinical Test of  Sensory Interaction for Balance    (CTSIB):  CONDITION TIME STRATEGY SWAY  Eyes open, firm surface 30 seconds ankle normal  Eyes closed, firm surface 30 seconds ankle Increased sway  Eyes open, foam surface 30 seconds ankle Increased sway  Eyes closed, foam surface 30 seconds Ankle, hip, trunk Increased sway, reaches for b/l parallel bars    Walk outs forward for balance with external forces- eccentric control reverse, 30# at waist 5x Walk outs laterally- for balance with external forces eccentric control opposite direction, 30# at waist, 4x ea (PT close supervision for all) Tandem stance: 3x 30 seconds ea foot in front in parallel bars- not today Tandem walk fwd:  4 laps in parallel bars- not today Feet together airex: eyes open x 1 min, eyes closed 3x 15 seconds, head turns R/L x20, up/down x20- not today Amb 70 ft in hallway x 2 with head turns R and L Pivot turn R/L and stop and stand after Amb 70 ft in hallway x 2 with vertical head turns up/down Pivot turn R/L and stop and stand after Walking 70 ft in hallway with self vertical ball toss x 2 laps Walking 70 ft in hallway with PT/pt lateral ball toss x 2 laps Walking 70 ft in hallway with cognitive dual task count down by 2s and up by 3's Feet together gaze stabilization VOR- horizontal 3x 30 seconds, vertical 3x30 sec- HEP Feet together on airex: head turns R/L and up/down 30 seconds x 3 ea- not today Standing on bosu: 2 min, intermittent hand hold on parallel bars- not today Front step ups onto bosu: x10 ea with pause at the top, find balance without UE support- not today Alternating foot taps on 6 inch step: 45 seconds, 3 rounds (outside on curb, without railing)- not today Amb forward/reverse on grass surface with PT supervision gait belt 4 x20 ft- not today Blue ladder on floor: forward alternating marches 4 laps, lateral high knees 2 laps, diagonal forward alternating SLS outside ladder 4 laps (PT close  supervision)   Therapeutic Exercise: Sit to stand 2x5 Ascend/descend stairs: using single railing, x 4 laps- not today Stepping over obstacles: 4 laps in parallel bars- boxes/foam rollers- not today Squats holding medicine ball: 8 lbs 2x10 (chair behind her) Heel raises: 2x10 Toe raises: 2x10 Front step ups on 6 inch step: LRRL, RLLR x10 ea Standing hamstring curl: 5# 2x12- not today Seated knee extension: 5# 2x12 - not today Deadlifts: 2x7# dumbbells, 2x15  HEP instruction- see below  PATIENT EDUCATION: Education details: PT POC/tx for balance and strength deficits Person educated: Patient Education method: Explanation Education comprehension: verbalized understanding  HOME EXERCISE PROGRAM: Access Code: [email or web address] URL: [email or web address] Date: 03/09/2023 Prepared by: Max Fickle  Exercises - Sit to Stand Without Arm Support  - 1 x daily - 7 x weekly - 4 sets - 5 reps - Tandem Walking with Counter Support  - 1 x daily - 7 x weekly - 4 sets - Single Leg Stance  - 1 x daily - 7 x weekly - 3-5 reps - 15 hold - Seated Gaze Stabilization with Head Nod  - 1 x daily - 7 x weekly - 30 reps - 30 sec hold - Seated Gaze Stabilization with Head Rotation  - 1 x daily - 7 x weekly - 3 reps - 30 sec hold   GOALS: Goals reviewed with patient? Yes  SHORT TERM GOALS: Target date: 03/28/23  Pt will demonstrate ability to perform HEP for LE strengthening and balance exercises with proper technique/form in clinic Baseline: pt is not formally performing strength exercises currently at home Goal status: INITIAL  LONG TERM GOALS: Target date: 04/13/23  Improve LE strength 1/2 MMT grade to facilitate improved ability to ambulate down her hallway at home and descend/ascend stairs in her home without losing balance Baseline: 4/5 Goal status: INITIAL  2.  Improve FGA score to >23 indicating decreased fall risk during ambulation Baseline: to be administered at 2nd visit; will  modifiy this goal accordingly if needed Goal status: INITIAL  3.  Improve 5x STS time to <15 seconds indicating decreased fall risk in population age >69 y/o Baseline: 15 Goal status: INITIAL  ASSESSMENT:  CLINICAL IMPRESSION: Pt demonstrated improved forward gait with fewer lateral deviations during gait + head turn activities today.  Had difficulty initially with balance during walk out exercise incorporating external forces to stabilize against, but performance improved within session and pt able to use stepping strategies to remain in control.  Remains appropriately challenged appropriately with LE strengthening today.  Overall she should continue to benefit from skilled PT to address balance, strength, gait deficits to improve safety and reduce fall risk during amb.  OBJECTIVE IMPAIRMENTS: Abnormal gait, decreased activity tolerance, decreased balance, decreased endurance, decreased strength, and impaired perceived functional ability.   ACTIVITY LIMITATIONS: standing, transfers, and locomotion level  PARTICIPATION LIMITATIONS: cleaning, interpersonal relationship, shopping, community activity, and yard work  PERSONAL FACTORS: Age, Time since onset of injury/illness/exacerbation, and 1-2 comorbidities: long COVID, orthopedic hx (see above)  are also affecting patient's functional outcome.   REHAB POTENTIAL: Good  CLINICAL DECISION MAKING: Evolving/moderate complexity  EVALUATION COMPLEXITY: Moderate  PLAN:  PT FREQUENCY: 2x/week  PT DURATION: 6 weeks  PLANNED INTERVENTIONS: 97110-Therapeutic exercises, 97530- Therapeutic activity, 97112- Neuromuscular re-education, 97535- Self Care, and 16109- Manual therapy  PLAN FOR NEXT SESSION: continue LE strengthening and balance exercises- emphasizing uneven surfaces, dynamic, dual tasking activities; VOR  Max Fickle, PT, DPT, OCS  Ardine Bjork, PT 04/06/2023, 12:59 PM

## 2023-04-08 ENCOUNTER — Ambulatory Visit: Payer: Medicare HMO

## 2023-04-08 DIAGNOSIS — R2689 Other abnormalities of gait and mobility: Secondary | ICD-10-CM | POA: Diagnosis not present

## 2023-04-08 NOTE — Therapy (Signed)
 OUTPATIENT PHYSICAL THERAPY NEURO TREATMENT   Patient Name: Amber Richardson MRN: [id number] DOB:May 19, 1955, 68 y.o., female Today's Date: 04/08/2023  PCP: Dr. Lorin Picket, MD  REFERRING PROVIDER: same  END OF SESSION:  PT End of Session - 04/08/23 1300     Visit Number 9    Number of Visits 13    Date for PT Re-Evaluation 04/13/23    Authorization Type 2x/week x 6 weeks (PN recert on 04/13/23)    Authorization Time Period Medicare, PN needed at visit #10    PT Start Time 1250    PT Stop Time 1335    PT Time Calculation (min) 45 min    Equipment Utilized During Treatment Gait belt    Activity Tolerance Patient tolerated treatment well    Behavior During Therapy [facility] for tasks assessed/performed              Past Medical History:  Diagnosis Date   Allergies    Basal cell carcinoma    Fatigue 08/30/2014   Generalized anxiety disorder 03/21/2011   GERD (gastroesophageal reflux disease)    History of COVID-19 02/22/2019   HLD (hyperlipidemia)    HSV infection 09/20/2014   Hypercholesterolemia 02/08/2015   Hyperglycemia 08/28/2019   Hyperthyroidism    Major depressive disorder    Osteoporosis 05/13/2015   Pyelonephritis 1985   Scalp, arm, and leg tingling 08/28/2019   Tendinosis 09/12/2013   Rotator cuff tendinosis. Mild capsulitis.   Tremor 08/28/2019   Subtle to mild, upper extremities, left worse than right   Past Surgical History:  Procedure Laterality Date   DILATION AND CURETTAGE OF UTERUS     Miscarriage     x1   TONSILLECTOMY  1963   Patient Active Problem List   Diagnosis Date Noted   Balance problem 01/02/2023   Poison ivy dermatitis 09/26/2022   Urinary frequency 09/26/2022   Abnormal liver function tests 08/22/2022   Flank pain 08/22/2022   Cold intolerance 08/11/2022   Concussion 08/11/2022   Diarrhea 03/26/2022   Headache 01/29/2022   Melanoma in situ (HCC) 06/08/2021   Contact dermatitis and eczema due to plant 05/31/2021   Healthcare  maintenance 11/11/2020   Rib pain on right side 09/30/2020   Joint pain 09/19/2020   Chest congestion 06/17/2020   Joint stiffness 06/17/2020   Change in vision 06/17/2020   Unsteady gait 06/17/2020   Moderate episode of recurrent major depressive disorder (HCC) 06/17/2020   Muscle weakness 06/13/2020   Stress 11/14/2019   Breast cancer screening 11/14/2019   Major depressive disorder    Disorder of bursae of shoulder region 09/01/2019   Knee pain 09/01/2019   Low back pain 09/01/2019   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   Tremor 08/28/2019   Hyperglycemia 08/28/2019   History of COVID-19 02/20/2019   Left arm pain 01/09/2019   Neck pain 09/04/2018   Dizziness 12/19/2017   Dyspnea 09/13/2017   Left facial numbness 06/14/2015   Osteoporosis 05/13/2015   Hypercholesterolemia 02/08/2015   Left foot pain 11/04/2014   Atrophic vaginitis 09/20/2014   HSV infection 09/20/2014   Pelvic pain in female 09/20/2014   Fatigue 08/30/2014   Chest pain 08/30/2014   Abdominal pain 08/29/2014   Tendinosis 09/12/2013   Basal cell carcinoma 03/17/2012   Hyperthyroidism 03/17/2012   Fibroids 01/19/2012   BV (bacterial vaginosis) 01/05/2012   PMB (postmenopausal bleeding) 01/05/2012   Generalized anxiety disorder 03/21/2011   GERD (gastroesophageal reflux disease) 03/21/2011    ONSET DATE: 2021  REFERRING DIAG: R26.89 (ICD-10-CM) -  Balance problem   THERAPY DIAG:  Balance problem  Rationale for Evaluation and Treatment: Rehabilitation  SUBJECTIVE: From initial evaluation note 03/02/23                                                                                                                                                                                            SUBJECTIVE STATEMENT: Pt states she is having difficulty with her balance.  She has been noticing it for several months.  It is progessively worsening.  She feels unsteady on her feet at times while walking and has to lean into  the wall or reach for support.  She attributes the balance changes to her long COVID recovery process.  She is not falling to floor.  But when she stands up and starts moving example: walking towards a door and then leans on the door or leans on the wall for balance; sometimes she is weaving when she ambulates.  She notices she veers to one side more than the other.  She has a consult with [facility] neurology in March 2025 for a consult.  She had seen a neurologist earlier in the long COVID recovery process.  She has also seen her cardiologist and r/o cardiac reasons.    She has not been able to exercise a lot since COVID.  She still has sort term memory issues too.  She does walk 1.5 miles- [place] neighborhood; she has not done any strength training.  If she "overdoes it" she will have a flare up for 2 days.    She is not lightheaded, no dizziness noted.  Pt accompanied by: self  PERTINENT HISTORY: Jan 2021- had COVID and long COVID sx; spent 6 months lying bed, had a tough recovery; has had exploratory R ankle arthroscopy, no other LE  or spine injuries or surgeries.  Retired Teacher, early years/pre   PAIN:  Are you having pain? No  PRECAUTIONS: Fall  RED FLAGS: None   WEIGHT BEARING RESTRICTIONS: No  FALLS: Has patient fallen in last 6 months? No, but catches herself with the wall/doorway  LIVING ENVIRONMENT: Lives with: lives with their spouse Lives in: House/apartment Stairs: yes, 3 story home- she is concerned about her balance going down the stairs- does not feel stable and leans to the R when going down the stairs; sometimes has R ankle pain while going down the stairs- has rail on the L but not on the R; has 3 steps to enter Has following equipment at home: None  PLOF: Independent  PATIENT GOALS: to improve balance  OBJECTIVE:  Note: Objective measures were completed at Evaluation unless otherwise noted. (Portions deferred  secondary to time constraints as pt arrived late to  appointment)  COGNITION: Overall cognitive status: Within functional limits for tasks assessed   SENSATION: Not tested  COORDINATION: deferred  POSTURE: rounded shoulders  LOWER EXTREMITY ROM:   functionally able to perform sit to stand independently  Active  Right Eval Left Eval  Hip flexion    Hip extension    Hip abduction    Hip adduction    Hip internal rotation    Hip external rotation    Knee flexion    Knee extension    Ankle dorsiflexion    Ankle plantarflexion    Ankle inversion    Ankle eversion     (Blank rows = not tested)  LOWER EXTREMITY MMT:    MMT Right Eval Left Eval  Hip flexion 4 4  Hip extension 4 4  Hip abduction    Hip adduction    Hip internal rotation    Hip external rotation    Knee flexion 4 4  Knee extension 4 4  Ankle dorsiflexion    Ankle plantarflexion    Ankle inversion    Ankle eversion    (Blank rows = not tested)   GAIT:  Will further assess at next visit  FUNCTIONAL TESTS:  5 times sit to stand: 15 seconds SL balance: R x 20 seconds, L x 12 seconds  PATIENT SURVEYS:  FGA to be administered at second visit                                                                                                                              TREATMENT DATE: 04/08/23 Subjective: Pt reports she continues to feel a little "swimmy headed" inside her head, nothing is moving in her visual field externally, no vertigo or dizziness.  No falls to report.  She reports working on [employer].  Feels off balance when turning directions- less than before or turning head quickly.  Consult with neurology is 05/13/23 at [facility].    Pain: 0/10  Objective: From 03/04/23 Neuro re-ed: TUG: 9 seconds (normal range)  Functional Gait Assessment (FGA) administered: score 22/30 (indicates cut off for fall risk) Difficulty on stairs, amb with eyes closed, tandem walk, stepping over obstacle, amb with head turns horizontally>vertically  Modified Clinical Test  of Sensory Interaction for Balance    (CTSIB):  CONDITION TIME STRATEGY SWAY  Eyes open, firm surface 30 seconds ankle normal  Eyes closed, firm surface 30 seconds ankle Increased sway  Eyes open, foam surface 30 seconds ankle Increased sway  Eyes closed, foam surface 30 seconds Ankle, hip, trunk Increased sway, reaches for b/l parallel bars    Walk outs forward for balance with external forces- eccentric control reverse, 30# at waist 5x- not today Walk outs laterally- for balance with external forces eccentric control opposite direction, 30# at waist, 4x ea (PT close supervision for all)- not today Amb 70 ft in hallway x 2 with head turns R  and L with looking and verbalize letters in visual field Pivot turn R/L and stop and stand after Amb 70 ft in hallway x 2 with vertical head turns up/down Pivot turn R/L and stop and stand after Walking 70 ft in hallway with self vertical ball toss x 2 laps Walking 70 ft in hallway with PT/pt lateral ball toss x 2 laps Walking 70 ft in hallway with cognitive dual task count down by 2s and up by 3's- not today Feet together gaze stabilization VOR- horizontal 3x 30 seconds, vertical 3x30 sec in hallway Front step ups onto bosu: x10 ea with pause at the top, find balance without UE support Lateral step up and over on bosu: 2x10 ea direction Alternating foot taps on 6 inch step: 45 seconds, 3 rounds (outside on curb, without railing)- not today Amb forward/reverse on grass surface with PT supervision gait belt 4 x20 ft- not today Blue ladder on floor: forward alternating marches 4 laps, lateral high knees 2 laps, diagonal forward alternating SLS outside ladder 4 laps (PT close supervision) Feet together on airex: head turns R/L and up/down 30 seconds x 3 ea- not today Standing on bosu: 2 min, intermittent hand hold on parallel bars- not today Tandem stance: 3x 30 seconds ea foot in front in parallel bars- not today Tandem walk fwd: 4 laps in parallel bars-  not today Feet together airex: eyes open x 1 min, eyes closed 3x 15 seconds, head turns R/L x20, up/down x20- not today  Therapeutic Exercise: Sit to stand 2x5 Ascend/descend stairs: using single railing, x 4 laps- not today Stepping over obstacles: 4 laps in parallel bars (multi height hurdles) Squats holding 9 lbs 2x10 (chair behind her) Heel raises: 2x10 Toe raises: 2x10 Front step ups on 6 inch step: LRRL, RLLR x10 ea Standing hamstring curl: 5# 2x12- not today Seated knee extension: 5# 2x12 - not today Deadlifts: 2x7# dumbbells, 2x15- not today  HEP instruction- see below  PATIENT EDUCATION: Education details: PT POC/tx for balance and strength deficits Person educated: Patient Education method: Explanation Education comprehension: verbalized understanding  HOME EXERCISE PROGRAM: Access Code: [email or web address] URL: [email or web address] Date: 03/09/2023 Prepared by: Max Fickle  Exercises - Sit to Stand Without Arm Support  - 1 x daily - 7 x weekly - 4 sets - 5 reps - Tandem Walking with Counter Support  - 1 x daily - 7 x weekly - 4 sets - Single Leg Stance  - 1 x daily - 7 x weekly - 3-5 reps - 15 hold - Seated Gaze Stabilization with Head Nod  - 1 x daily - 7 x weekly - 30 reps - 30 sec hold - Seated Gaze Stabilization with Head Rotation  - 1 x daily - 7 x weekly - 3 reps - 30 sec hold   GOALS: Goals reviewed with patient? Yes  SHORT TERM GOALS: Target date: 03/28/23  Pt will demonstrate ability to perform HEP for LE strengthening and balance exercises with proper technique/form in clinic Baseline: pt is not formally performing strength exercises currently at home Goal status: INITIAL  LONG TERM GOALS: Target date: 04/13/23  Improve LE strength 1/2 MMT grade to facilitate improved ability to ambulate down her hallway at home and descend/ascend stairs in her home without losing balance Baseline: 4/5 Goal status: INITIAL  2.  Improve FGA score to >23  indicating decreased fall risk during ambulation Baseline: to be administered at 2nd visit; will modifiy this goal accordingly if needed Goal status: INITIAL  3.  Improve 5x STS time to <15 seconds indicating decreased fall risk in population age >22 y/o Baseline: 15 Goal status: INITIAL   ASSESSMENT:  CLINICAL IMPRESSION: Pt demonstrated improved LE proprioception without relying on looking at feet during multi height hurdle step overs today.  Able to amb with slight improvement in forward gait during dynamic gait dual tasking today- less lateral path deviation noted.  Challenged balance with bosu lateral step up and over today, pt required UE intermittent support on railing for balance.  Remains appropriately challenged appropriately with LE strengthening today.  Overall she should continue to benefit from skilled PT to address balance, strength, gait deficits to improve safety and reduce fall risk during amb.  OBJECTIVE IMPAIRMENTS: Abnormal gait, decreased activity tolerance, decreased balance, decreased endurance, decreased strength, and impaired perceived functional ability.   ACTIVITY LIMITATIONS: standing, transfers, and locomotion level  PARTICIPATION LIMITATIONS: cleaning, interpersonal relationship, shopping, community activity, and yard work  PERSONAL FACTORS: Age, Time since onset of injury/illness/exacerbation, and 1-2 comorbidities: long COVID, orthopedic hx (see above)  are also affecting patient's functional outcome.   REHAB POTENTIAL: Good  CLINICAL DECISION MAKING: Evolving/moderate complexity  EVALUATION COMPLEXITY: Moderate  PLAN:  PT FREQUENCY: 2x/week  PT DURATION: 6 weeks  PLANNED INTERVENTIONS: 97110-Therapeutic exercises, 97530- Therapeutic activity, 97112- Neuromuscular re-education, 97535- Self Care, and [id number]- Manual therapy  PLAN FOR NEXT SESSION: continue LE strengthening and balance exercises- emphasizing uneven surfaces, dynamic, dual tasking  activities; VOR; progress note at next visit  Max Fickle, PT, DPT, OCS  Ardine Bjork, PT 04/08/2023, 2:44 PM

## 2023-04-13 ENCOUNTER — Ambulatory Visit

## 2023-04-15 ENCOUNTER — Ambulatory Visit

## 2023-04-17 ENCOUNTER — Other Ambulatory Visit: Payer: Self-pay | Admitting: Internal Medicine

## 2023-04-20 ENCOUNTER — Encounter

## 2023-04-22 ENCOUNTER — Ambulatory Visit

## 2023-04-27 ENCOUNTER — Ambulatory Visit

## 2023-04-27 DIAGNOSIS — R2689 Other abnormalities of gait and mobility: Secondary | ICD-10-CM | POA: Diagnosis not present

## 2023-04-27 NOTE — Therapy (Signed)
 OUTPATIENT PHYSICAL THERAPY NEURO TREATMENT/MEDICARE PROGRESS NOTE/RE-CERT THROUGH 06/08/23   Patient Name: Amber Richardson MRN: [id number] DOB:08-06-1955, 68 y.o., female Today's Date: 04/27/2023  PCP: Dr. Lorin Picket, MD  REFERRING PROVIDER: same  END OF SESSION:  PT End of Session - 04/27/23 1245     Visit Number 10    Number of Visits 13    Date for PT Re-Evaluation 06/08/23    Authorization Type 2x/week x 6 weeks (PN recert on 06/08/23) PN and recert done on 04/27/23    Authorization Time Period Medicare, PN needed at visit #10    PT Start Time 1250    PT Stop Time 1332    PT Time Calculation (min) 42 min    Equipment Utilized During Treatment Gait belt    Activity Tolerance Patient tolerated treatment well    Behavior During Therapy [facility] for tasks assessed/performed              Past Medical History:  Diagnosis Date   Allergies    Basal cell carcinoma    Fatigue 08/30/2014   Generalized anxiety disorder 03/21/2011   GERD (gastroesophageal reflux disease)    History of COVID-19 02/22/2019   HLD (hyperlipidemia)    HSV infection 09/20/2014   Hypercholesterolemia 02/08/2015   Hyperglycemia 08/28/2019   Hyperthyroidism    Major depressive disorder    Osteoporosis 05/13/2015   Pyelonephritis 1985   Scalp, arm, and leg tingling 08/28/2019   Tendinosis 09/12/2013   Rotator cuff tendinosis. Mild capsulitis.   Tremor 08/28/2019   Subtle to mild, upper extremities, left worse than right   Past Surgical History:  Procedure Laterality Date   DILATION AND CURETTAGE OF UTERUS     Miscarriage     x1   TONSILLECTOMY  1963   Patient Active Problem List   Diagnosis Date Noted   Balance problem 01/02/2023   Poison ivy dermatitis 09/26/2022   Urinary frequency 09/26/2022   Abnormal liver function tests 08/22/2022   Flank pain 08/22/2022   Cold intolerance 08/11/2022   Concussion 08/11/2022   Diarrhea 03/26/2022   Headache 01/29/2022   Melanoma in situ (HCC) 06/08/2021    Contact dermatitis and eczema due to plant 05/31/2021   Healthcare maintenance 11/11/2020   Rib pain on right side 09/30/2020   Joint pain 09/19/2020   Chest congestion 06/17/2020   Joint stiffness 06/17/2020   Change in vision 06/17/2020   Unsteady gait 06/17/2020   Moderate episode of recurrent major depressive disorder (HCC) 06/17/2020   Muscle weakness 06/13/2020   Stress 11/14/2019   Breast cancer screening 11/14/2019   Major depressive disorder    Disorder of bursae of shoulder region 09/01/2019   Knee pain 09/01/2019   Low back pain 09/01/2019   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   Tremor 08/28/2019   Hyperglycemia 08/28/2019   History of COVID-19 02/20/2019   Left arm pain 01/09/2019   Neck pain 09/04/2018   Dizziness 12/19/2017   Dyspnea 09/13/2017   Left facial numbness 06/14/2015   Osteoporosis 05/13/2015   Hypercholesterolemia 02/08/2015   Left foot pain 11/04/2014   Atrophic vaginitis 09/20/2014   HSV infection 09/20/2014   Pelvic pain in female 09/20/2014   Fatigue 08/30/2014   Chest pain 08/30/2014   Abdominal pain 08/29/2014   Tendinosis 09/12/2013   Basal cell carcinoma 03/17/2012   Hyperthyroidism 03/17/2012   Fibroids 01/19/2012   BV (bacterial vaginosis) 01/05/2012   PMB (postmenopausal bleeding) 01/05/2012   Generalized anxiety disorder 03/21/2011   GERD (gastroesophageal reflux disease) 03/21/2011  ONSET DATE: 2021  REFERRING DIAG: R26.89 (ICD-10-CM) - Balance problem   THERAPY DIAG:  Balance problem  Rationale for Evaluation and Treatment: Rehabilitation  SUBJECTIVE: From initial evaluation note 03/02/23                                                                                                                                                                                            SUBJECTIVE STATEMENT: Pt states she is having difficulty with her balance.  She has been noticing it for several months.  It is progessively worsening.  She  feels unsteady on her feet at times while walking and has to lean into the wall or reach for support.  She attributes the balance changes to her long COVID recovery process.  She is not falling to floor.  But when she stands up and starts moving example: walking towards a door and then leans on the door or leans on the wall for balance; sometimes she is weaving when she ambulates.  She notices she veers to one side more than the other.  She has a consult with [facility] in March 2025 for a consult.  She had seen a neurologist earlier in the long COVID recovery process.  She has also seen her cardiologist and r/o cardiac reasons.    She has not been able to exercise a lot since COVID.  She still has sort term memory issues too.  She does walk 1.5 miles- [place] neighborhood; she has not done any strength training.  If she "overdoes it" she will have a flare up for 2 days.    She is not lightheaded, no dizziness noted.  Pt accompanied by: self  PERTINENT HISTORY: Jan 2021- had COVID and long COVID sx; spent 6 months lying bed, had a tough recovery; has had exploratory R ankle arthroscopy, no other LE  or spine injuries or surgeries.  Retired Teacher, early years/pre   PAIN:  Are you having pain? No  PRECAUTIONS: Fall  RED FLAGS: None   WEIGHT BEARING RESTRICTIONS: No  FALLS: Has patient fallen in last 6 months? No, but catches herself with the wall/doorway  LIVING ENVIRONMENT: Lives with: lives with their spouse Lives in: House/apartment Stairs: yes, 3 story home- she is concerned about her balance going down the stairs- does not feel stable and leans to the R when going down the stairs; sometimes has R ankle pain while going down the stairs- has rail on the L but not on the R; has 3 steps to enter Has following equipment at home: None  PLOF: Independent  PATIENT GOALS: to improve balance  OBJECTIVE:  Note: Objective measures  were completed at Evaluation unless otherwise noted. (Portions  deferred secondary to time constraints as pt arrived late to appointment)  COGNITION: Overall cognitive status: Within functional limits for tasks assessed   SENSATION: Not tested  COORDINATION: deferred  POSTURE: rounded shoulders  LOWER EXTREMITY ROM:   functionally able to perform sit to stand independently  Active  Right Eval Left Eval  Hip flexion    Hip extension    Hip abduction    Hip adduction    Hip internal rotation    Hip external rotation    Knee flexion    Knee extension    Ankle dorsiflexion    Ankle plantarflexion    Ankle inversion    Ankle eversion     (Blank rows = not tested)  LOWER EXTREMITY MMT:    MMT Right Eval Left Eval  Hip flexion 4 4  Hip extension 4 4  Hip abduction    Hip adduction    Hip internal rotation    Hip external rotation    Knee flexion 4 4  Knee extension 4 4  Ankle dorsiflexion    Ankle plantarflexion    Ankle inversion    Ankle eversion    (Blank rows = not tested)   GAIT:  Will further assess at next visit  FUNCTIONAL TESTS:  5 times sit to stand: 15 seconds SL balance: R x 20 seconds, L x 12 seconds  PATIENT SURVEYS:  FGA to be administered at second visit                                                                                                                              TREATMENT DATE: 04/27/23 Subjective: Pt returns to PT after 2 week gap in tx due to family emergency requiring her full attention.  Has not been able to work on HEP as much these past 2 weeks while attending to urgent family matters.  Overall, no falls in the past 2 weeks.  Has noticed episodes of unsteadiness still- one this week she recalls is feeling off with standing up after leaning forward to make bed.   Pain: 0/10  Objective: 5x STS: 9 seconds (was 15 seconds at initial evaluation) LE MMT: hip flexion 5/5 b/l (was 4/5 at initial evaluation) SLS balance R x 28 seconds, L x 14 seconds (was 20 and 12 at initial  evaluation)  Functional Gait Assessment (FGA) administered: to be administered as re-test next session At initial evaluation- score 22/30 (indicates cut off for fall risk) Difficulty on stairs, amb with eyes closed, tandem walk, stepping over obstacle, amb with head turns horizontally>vertically  Modified Clinical Test of Sensory Interaction for Balance    (CTSIB): to be administered at next session  CONDITION TIME STRATEGY SWAY  Eyes open, firm surface 30 seconds ankle normal  Eyes closed, firm surface 30 seconds ankle Increased sway  Eyes open, foam surface 30 seconds ankle Increased sway  Eyes closed, foam surface  30 seconds Ankle, hip, trunk Increased sway, reaches for b/l parallel bars    Neuro re-ed:  SLS: 3x ea x 12-20 sec intervals  Tandem stance, both ways, 3x with 5 lb med ball overhead lift x 5 reps, 2 sets  Blue ladder on floor: alternating high marches forward 3 laps, lateral high knees 2 laps, diagonal each direction 2 laps ea Sit to stand with 360 deg turn, 5 lb medball toss from PT, turn opposite way, pass ball back to PT then sit: 3x CW and 3X CCW, repeated 2 sets  Therapeutic Exercise: Sit to stand 2x5 Ascend/descend stairs: using single railing, x 4 laps Stepping over obstacles: 4 laps in parallel bars (multi height hurdles) Squats holding 9 lbs 2x10 (chair behind her) Heel raises: 2x10 Toe raises: 2x10 Front step ups on 6 inch step: LRRL, RLLR x10 ea Standing hamstring curl: 5# 2x12- not today Seated knee extension: 5# 2x12 - not today Deadlifts: 2x7# dumbbells, 2x15- not today  HEP instruction- see below  PATIENT EDUCATION: Education details: PT POC/tx for balance and strength deficits Person educated: Patient Education method: Explanation Education comprehension: verbalized understanding  HOME EXERCISE PROGRAM: Access Code: [email or web address] URL: [email or web address] Date: 03/09/2023 Prepared by: Max Fickle  Exercises - Sit to Stand  Without Arm Support  - 1 x daily - 7 x weekly - 4 sets - 5 reps - Tandem Walking with Counter Support  - 1 x daily - 7 x weekly - 4 sets - Single Leg Stance  - 1 x daily - 7 x weekly - 3-5 reps - 15 hold - Seated Gaze Stabilization with Head Nod  - 1 x daily - 7 x weekly - 30 reps - 30 sec hold - Seated Gaze Stabilization with Head Rotation  - 1 x daily - 7 x weekly - 3 reps - 30 sec hold   GOALS: Goals reviewed with patient? Yes  SHORT TERM GOALS: Target date: 03/28/23  Pt will demonstrate ability to perform HEP for LE strengthening and balance exercises with proper technique/form in clinic Baseline: pt is not formally performing strength exercises currently at home; 04/27/23 in progress Goal status: In progress  LONG TERM GOALS: Target date: 06/08/23  Improve LE strength 1/2 MMT grade to facilitate improved ability to ambulate down her hallway at home and descend/ascend stairs in her home without losing balance Baseline: 4/5; 5/5 hip flexion (not knee extension or flex yet- still 4/5), ascending and descending stairs with improved balance, but hasn't been home to trial this in a few weeks Goal status: In progress  2.  Improve FGA score to >23 indicating decreased fall risk during ambulation Baseline: to be administered at 2nd visit; will modifiy this goal accordingly if needed Goal status: In progress  3.  Improve 5x STS time to <15 seconds indicating decreased fall risk in population age >79 y/o Baseline: 15; 04/27/23: 9 seconds best trial, 11 seconds other trial Goal status: MET   ASSESSMENT:  CLINICAL IMPRESSION: Pt has attended 10 outpatient physical therapy sessions.  Overall, she is making progress towards PT goals even with missing the past 2 weeks due to a family emergency.  Demonstrates objective improvements in balance measures and strength measures. She is still challenged and fatigues easily with LE strengthening; has difficulty with balance in SLS on L compared to R, and  tandem stance; also has difficulty with dynamic balance activities requiring heavy trunk lean or stepping strategies to regain control occasionally.  Pt remains very motivated to participate in  PT.  Has likely not ye obtained max benefit from PT.  She is an appropriate candidate for continued skilled PT to address balance, strength, gait deficits to improve safety and reduce fall risk during amb.  OBJECTIVE IMPAIRMENTS: Abnormal gait, decreased activity tolerance, decreased balance, decreased endurance, decreased strength, and impaired perceived functional ability.   ACTIVITY LIMITATIONS: standing, transfers, and locomotion level  PARTICIPATION LIMITATIONS: cleaning, interpersonal relationship, shopping, community activity, and yard work  PERSONAL FACTORS: Age, Time since onset of injury/illness/exacerbation, and 1-2 comorbidities: long COVID, orthopedic hx (see above)  are also affecting patient's functional outcome.   REHAB POTENTIAL: Good  CLINICAL DECISION MAKING: Evolving/moderate complexity  EVALUATION COMPLEXITY: Moderate  PLAN:  PT FREQUENCY: 2x/week  PT DURATION: 6 weeks  PLANNED INTERVENTIONS: 97110-Therapeutic exercises, 97530- Therapeutic activity, 97112- Neuromuscular re-education, 97535- Self Care, and 25956- Manual therapy  PLAN FOR NEXT SESSION: continue LE strengthening and balance exercises- emphasizing uneven surfaces, dynamic, dual tasking activities; VOR; multi stepped or sequenced/stacked complex balance exercises  Max Fickle, PT, DPT, OCS  Ardine Bjork, PT 04/27/2023, 12:46 PM

## 2023-04-29 ENCOUNTER — Ambulatory Visit: Attending: Internal Medicine

## 2023-04-29 DIAGNOSIS — R2689 Other abnormalities of gait and mobility: Secondary | ICD-10-CM | POA: Diagnosis present

## 2023-04-29 NOTE — Therapy (Signed)
 OUTPATIENT PHYSICAL THERAPY NEURO TREATMENT/MEDICARE PROGRESS NOTE/RE-CERT THROUGH 06/08/23   Patient Name: Amber Richardson MRN: [id number] DOB:1955/05/26, 68 y.o., female Today's Date: 04/29/2023  PCP: Dr. Lorin Picket, MD  REFERRING PROVIDER: same  END OF SESSION:  PT End of Session - 04/29/23 1258     Visit Number 11    Number of Visits 22    Date for PT Re-Evaluation 06/08/23    Authorization Type 2x/week x 6 weeks (PN recert on 06/08/23) PN and recert done on 04/27/23    Authorization Time Period Medicare, PN needed at visit #20    PT Start Time 1255    PT Stop Time 1340    PT Time Calculation (min) 45 min    Equipment Utilized During Treatment Gait belt    Activity Tolerance Patient tolerated treatment well    Behavior During Therapy [facility] for tasks assessed/performed              Past Medical History:  Diagnosis Date   Allergies    Basal cell carcinoma    Fatigue 08/30/2014   Generalized anxiety disorder 03/21/2011   GERD (gastroesophageal reflux disease)    History of COVID-19 02/22/2019   HLD (hyperlipidemia)    HSV infection 09/20/2014   Hypercholesterolemia 02/08/2015   Hyperglycemia 08/28/2019   Hyperthyroidism    Major depressive disorder    Osteoporosis 05/13/2015   Pyelonephritis 1985   Scalp, arm, and leg tingling 08/28/2019   Tendinosis 09/12/2013   Rotator cuff tendinosis. Mild capsulitis.   Tremor 08/28/2019   Subtle to mild, upper extremities, left worse than right   Past Surgical History:  Procedure Laterality Date   DILATION AND CURETTAGE OF UTERUS     Miscarriage     x1   TONSILLECTOMY  1963   Patient Active Problem List   Diagnosis Date Noted   Balance problem 01/02/2023   Poison ivy dermatitis 09/26/2022   Urinary frequency 09/26/2022   Abnormal liver function tests 08/22/2022   Flank pain 08/22/2022   Cold intolerance 08/11/2022   Concussion 08/11/2022   Diarrhea 03/26/2022   Headache 01/29/2022   Melanoma in situ (HCC) 06/08/2021    Contact dermatitis and eczema due to plant 05/31/2021   Healthcare maintenance 11/11/2020   Rib pain on right side 09/30/2020   Joint pain 09/19/2020   Chest congestion 06/17/2020   Joint stiffness 06/17/2020   Change in vision 06/17/2020   Unsteady gait 06/17/2020   Moderate episode of recurrent major depressive disorder (HCC) 06/17/2020   Muscle weakness 06/13/2020   Stress 11/14/2019   Breast cancer screening 11/14/2019   Major depressive disorder    Disorder of bursae of shoulder region 09/01/2019   Knee pain 09/01/2019   Low back pain 09/01/2019   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   Tremor 08/28/2019   Hyperglycemia 08/28/2019   History of COVID-19 02/20/2019   Left arm pain 01/09/2019   Neck pain 09/04/2018   Dizziness 12/19/2017   Dyspnea 09/13/2017   Left facial numbness 06/14/2015   Osteoporosis 05/13/2015   Hypercholesterolemia 02/08/2015   Left foot pain 11/04/2014   Atrophic vaginitis 09/20/2014   HSV infection 09/20/2014   Pelvic pain in female 09/20/2014   Fatigue 08/30/2014   Chest pain 08/30/2014   Abdominal pain 08/29/2014   Tendinosis 09/12/2013   Basal cell carcinoma 03/17/2012   Hyperthyroidism 03/17/2012   Fibroids 01/19/2012   BV (bacterial vaginosis) 01/05/2012   PMB (postmenopausal bleeding) 01/05/2012   Generalized anxiety disorder 03/21/2011   GERD (gastroesophageal reflux disease) 03/21/2011  ONSET DATE: 2021  REFERRING DIAG: R26.89 (ICD-10-CM) - Balance problem   THERAPY DIAG:  Balance problem  Rationale for Evaluation and Treatment: Rehabilitation  SUBJECTIVE: From initial evaluation note 03/02/23                                                                                                                                                                                            SUBJECTIVE STATEMENT: Pt states she is having difficulty with her balance.  She has been noticing it for several months.  It is progessively worsening.  She  feels unsteady on her feet at times while walking and has to lean into the wall or reach for support.  She attributes the balance changes to her long COVID recovery process.  She is not falling to floor.  But when she stands up and starts moving example: walking towards a door and then leans on the door or leans on the wall for balance; sometimes she is weaving when she ambulates.  She notices she veers to one side more than the other.  She has a consult with [facility] in March 2025 for a consult.  She had seen a neurologist earlier in the long COVID recovery process.  She has also seen her cardiologist and r/o cardiac reasons.    She has not been able to exercise a lot since COVID.  She still has sort term memory issues too.  She does walk 1.5 miles- [place] neighborhood; she has not done any strength training.  If she "overdoes it" she will have a flare up for 2 days.    She is not lightheaded, no dizziness noted.  Pt accompanied by: self  PERTINENT HISTORY: Jan 2021- had COVID and long COVID sx; spent 6 months lying bed, had a tough recovery; has had exploratory R ankle arthroscopy, no other LE  or spine injuries or surgeries.  Retired Teacher, early years/pre   PAIN:  Are you having pain? No  PRECAUTIONS: Fall  RED FLAGS: None   WEIGHT BEARING RESTRICTIONS: No  FALLS: Has patient fallen in last 6 months? No, but catches herself with the wall/doorway  LIVING ENVIRONMENT: Lives with: lives with their spouse Lives in: House/apartment Stairs: yes, 3 story home- she is concerned about her balance going down the stairs- does not feel stable and leans to the R when going down the stairs; sometimes has R ankle pain while going down the stairs- has rail on the L but not on the R; has 3 steps to enter Has following equipment at home: None  PLOF: Independent  PATIENT GOALS: to improve balance  OBJECTIVE:  Note: Objective measures  were completed at Evaluation unless otherwise noted. (Portions  deferred secondary to time constraints as pt arrived late to appointment)  COGNITION: Overall cognitive status: Within functional limits for tasks assessed   SENSATION: Not tested  COORDINATION: deferred  POSTURE: rounded shoulders  LOWER EXTREMITY ROM:   functionally able to perform sit to stand independently  Active  Right Eval Left Eval  Hip flexion    Hip extension    Hip abduction    Hip adduction    Hip internal rotation    Hip external rotation    Knee flexion    Knee extension    Ankle dorsiflexion    Ankle plantarflexion    Ankle inversion    Ankle eversion     (Blank rows = not tested)  LOWER EXTREMITY MMT:    MMT Right Eval Left Eval  Hip flexion 4 4  Hip extension 4 4  Hip abduction    Hip adduction    Hip internal rotation    Hip external rotation    Knee flexion 4 4  Knee extension 4 4  Ankle dorsiflexion    Ankle plantarflexion    Ankle inversion    Ankle eversion    (Blank rows = not tested)   GAIT:  Will further assess at next visit  FUNCTIONAL TESTS:  5 times sit to stand: 15 seconds SL balance: R x 20 seconds, L x 12 seconds  PATIENT SURVEYS:  FGA to be administered at second visit                                                                                                                              TREATMENT DATE: 04/29/23 Subjective: Pt returns to PT after 2 week gap in tx due to family emergency requiring her full attention.  No falls since Monday; felt off balance and leaned into wall occasionally.  Pain: 0/10  Objective: 5x STS: 9 seconds (was 15 seconds at initial evaluation) LE MMT: hip flexion 5/5 b/l (was 4/5 at initial evaluation) SLS balance R x 28 seconds, L x 14 seconds (was 20 and 12 at initial evaluation)  Functional Gait Assessment (FGA) administered: to be administered as re-test next session At initial evaluation- score 22/30 (indicates cut off for fall risk) Difficulty on stairs, amb with eyes closed,  tandem walk, stepping over obstacle, amb with head turns horizontally>vertically  Modified Clinical Test of Sensory Interaction for Balance    (CTSIB): to be administered at next session  CONDITION TIME STRATEGY SWAY  Eyes open, firm surface 30 seconds ankle normal  Eyes closed, firm surface 30 seconds ankle Increased sway  Eyes open, foam surface 30 seconds ankle Increased sway  Eyes closed, foam surface 30 seconds Ankle, hip, trunk Increased sway, reaches for b/l parallel bars    Neuro re-ed:  SLS: 3x ea x 12-20 sec intervals  Tandem stance, both ways, 3x with 5 lb med ball overhead lift x 5 reps, 2  sets  Blue ladder on floor: alternating high marches forward 3 laps, lateral high knees 2 laps, diagonal each direction 2 laps ea- not today Sit to stand with 360 deg turn, 5 lb medball toss from PT, turn opposite way, pass ball back to PT then sit: 3x CW and 3X CCW, repeated 2 sets Ball toss at trampoline (feet together) on airex: forward, 5 lbs 3x30 seconds Feet together on airex: head turns R/L 30 second intervals x 3, up/down 30 second intervals x3 (PT close supervision) Figure 8 narrow 8 laps- pt notes no dizziness today VOR: feet together 3x 30 seconds R/L and up/down standing Eyes R/L to target x10 ea, 3 set, standing feet together  Therapeutic Exercise: Sit to stand 2x5 Ascend/descend stairs: using single railing, x 4 laps Stepping over obstacles: 4 laps in parallel bars (multi height hurdles)- not today Squats holding 9 lbs 2x10 (chair behind her) Heel raises: 2x10 Toe raises: 2x10 Front step ups on 6 inch step: LRRL, RLLR x10 ea Standing hamstring curl: 5# 2x12- not today Seated knee extension: 5# 2x12 - not today Deadlifts: 2x7# dumbbells, 2x15- not today  HEP instruction- see below  PATIENT EDUCATION: Education details: PT POC/tx for balance and strength deficits Person educated: Patient Education method: Explanation Education comprehension: verbalized  understanding  HOME EXERCISE PROGRAM: Access Code: [email or web address] URL: [email or web address] Date: 03/09/2023 Prepared by: Max Fickle  Exercises - Sit to Stand Without Arm Support  - 1 x daily - 7 x weekly - 4 sets - 5 reps - Tandem Walking with Counter Support  - 1 x daily - 7 x weekly - 4 sets - Single Leg Stance  - 1 x daily - 7 x weekly - 3-5 reps - 15 hold - Seated Gaze Stabilization with Head Nod  - 1 x daily - 7 x weekly - 30 reps - 30 sec hold - Seated Gaze Stabilization with Head Rotation  - 1 x daily - 7 x weekly - 3 reps - 30 sec hold   GOALS: Goals reviewed with patient? Yes  SHORT TERM GOALS: Target date: 03/28/23  Pt will demonstrate ability to perform HEP for LE strengthening and balance exercises with proper technique/form in clinic Baseline: pt is not formally performing strength exercises currently at home; 04/27/23 in progress Goal status: In progress  LONG TERM GOALS: Target date: 06/08/23  Improve LE strength 1/2 MMT grade to facilitate improved ability to ambulate down her hallway at home and descend/ascend stairs in her home without losing balance Baseline: 4/5; 5/5 hip flexion (not knee extension or flex yet- still 4/5), ascending and descending stairs with improved balance, but hasn't been home to trial this in a few weeks Goal status: In progress  2.  Improve FGA score to >23 indicating decreased fall risk during ambulation Baseline: to be administered at 2nd visit; will modifiy this goal accordingly if needed Goal status: In progress  3.  Improve 5x STS time to <15 seconds indicating decreased fall risk in population age >21 y/o Baseline: 15; 04/27/23: 9 seconds best trial, 11 seconds other trial Goal status: MET   ASSESSMENT:  CLINICAL IMPRESSION: Pt has slight loss of balance with head turns R/L standing on airex and with eye movement R/L between 2 fixed targets today.  Able to regain balance with stepping strategy.  Overall, she is  making progress towards PT goals even with missing the past 2 weeks due to a family emergency.  Demonstrates objective improvements in balance measures and strength measures. She  is still challenged and fatigues easily with LE strengthening; has difficulty with balance in SLS on L compared to R, and tandem stance; also has difficulty with dynamic balance activities requiring heavy trunk lean or stepping strategies to regain control occasionally.  Pt remains very motivated to participate in PT.  Has likely not ye obtained max benefit from PT.  She is an appropriate candidate for continued skilled PT to address balance, strength, gait deficits to improve safety and reduce fall risk during amb.  OBJECTIVE IMPAIRMENTS: Abnormal gait, decreased activity tolerance, decreased balance, decreased endurance, decreased strength, and impaired perceived functional ability.   ACTIVITY LIMITATIONS: standing, transfers, and locomotion level  PARTICIPATION LIMITATIONS: cleaning, interpersonal relationship, shopping, community activity, and yard work  PERSONAL FACTORS: Age, Time since onset of injury/illness/exacerbation, and 1-2 comorbidities: long COVID, orthopedic hx (see above)  are also affecting patient's functional outcome.   REHAB POTENTIAL: Good  CLINICAL DECISION MAKING: Evolving/moderate complexity  EVALUATION COMPLEXITY: Moderate  PLAN:  PT FREQUENCY: 2x/week  PT DURATION: 6 weeks  PLANNED INTERVENTIONS: 97110-Therapeutic exercises, 97530- Therapeutic activity, 97112- Neuromuscular re-education, 97535- Self Care, and 16109- Manual therapy  PLAN FOR NEXT SESSION: continue LE strengthening and balance exercises- emphasizing uneven surfaces, dynamic, dual tasking activities; VOR; multi stepped or sequenced/stacked complex balance exercises  Max Fickle, PT, DPT, OCS  Ardine Bjork, PT 04/29/2023, 4:07 PM

## 2023-05-04 ENCOUNTER — Ambulatory Visit

## 2023-05-04 DIAGNOSIS — R2689 Other abnormalities of gait and mobility: Secondary | ICD-10-CM | POA: Diagnosis not present

## 2023-05-04 NOTE — Therapy (Signed)
 OUTPATIENT PHYSICAL THERAPY NEURO TREATMENT/MEDICARE PROGRESS NOTE/RE-CERT THROUGH 06/08/23   Patient Name: Amber Richardson MRN: [id number] DOB:15-Aug-1955, 68 y.o., female Today's Date: 05/04/2023  PCP: Dr. Lorin Picket, MD  REFERRING PROVIDER: same  END OF SESSION:  PT End of Session - 05/04/23 1250     Visit Number 12    Number of Visits 22    Date for PT Re-Evaluation 06/08/23    Authorization Type 2x/week x 6 weeks (PN recert on 06/08/23) PN and recert done on 04/27/23    Authorization Time Period Medicare, PN needed at visit #20    PT Start Time 1250    PT Stop Time 1335    PT Time Calculation (min) 45 min    Equipment Utilized During Treatment Gait belt    Activity Tolerance Patient tolerated treatment well    Behavior During Therapy [facility] for tasks assessed/performed              Past Medical History:  Diagnosis Date   Allergies    Basal cell carcinoma    Fatigue 08/30/2014   Generalized anxiety disorder 03/21/2011   GERD (gastroesophageal reflux disease)    History of COVID-19 02/22/2019   HLD (hyperlipidemia)    HSV infection 09/20/2014   Hypercholesterolemia 02/08/2015   Hyperglycemia 08/28/2019   Hyperthyroidism    Major depressive disorder    Osteoporosis 05/13/2015   Pyelonephritis 1985   Scalp, arm, and leg tingling 08/28/2019   Tendinosis 09/12/2013   Rotator cuff tendinosis. Mild capsulitis.   Tremor 08/28/2019   Subtle to mild, upper extremities, left worse than right   Past Surgical History:  Procedure Laterality Date   DILATION AND CURETTAGE OF UTERUS     Miscarriage     x1   TONSILLECTOMY  1963   Patient Active Problem List   Diagnosis Date Noted   Balance problem 01/02/2023   Poison ivy dermatitis 09/26/2022   Urinary frequency 09/26/2022   Abnormal liver function tests 08/22/2022   Flank pain 08/22/2022   Cold intolerance 08/11/2022   Concussion 08/11/2022   Diarrhea 03/26/2022   Headache 01/29/2022   Melanoma in situ (HCC) 06/08/2021    Contact dermatitis and eczema due to plant 05/31/2021   Healthcare maintenance 11/11/2020   Rib pain on right side 09/30/2020   Joint pain 09/19/2020   Chest congestion 06/17/2020   Joint stiffness 06/17/2020   Change in vision 06/17/2020   Unsteady gait 06/17/2020   Moderate episode of recurrent major depressive disorder (HCC) 06/17/2020   Muscle weakness 06/13/2020   Stress 11/14/2019   Breast cancer screening 11/14/2019   Major depressive disorder    Disorder of bursae of shoulder region 09/01/2019   Knee pain 09/01/2019   Low back pain 09/01/2019   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   Tremor 08/28/2019   Hyperglycemia 08/28/2019   History of COVID-19 02/20/2019   Left arm pain 01/09/2019   Neck pain 09/04/2018   Dizziness 12/19/2017   Dyspnea 09/13/2017   Left facial numbness 06/14/2015   Osteoporosis 05/13/2015   Hypercholesterolemia 02/08/2015   Left foot pain 11/04/2014   Atrophic vaginitis 09/20/2014   HSV infection 09/20/2014   Pelvic pain in female 09/20/2014   Fatigue 08/30/2014   Chest pain 08/30/2014   Abdominal pain 08/29/2014   Tendinosis 09/12/2013   Basal cell carcinoma 03/17/2012   Hyperthyroidism 03/17/2012   Fibroids 01/19/2012   BV (bacterial vaginosis) 01/05/2012   PMB (postmenopausal bleeding) 01/05/2012   Generalized anxiety disorder 03/21/2011   GERD (gastroesophageal reflux disease) 03/21/2011  ONSET DATE: 2021  REFERRING DIAG: R26.89 (ICD-10-CM) - Balance problem   THERAPY DIAG:  Balance problem  Rationale for Evaluation and Treatment: Rehabilitation  SUBJECTIVE: From initial evaluation note 03/02/23                                                                                                                                                                                            SUBJECTIVE STATEMENT: Pt states she is having difficulty with her balance.  She has been noticing it for several months.  It is progessively worsening.  She  feels unsteady on her feet at times while walking and has to lean into the wall or reach for support.  She attributes the balance changes to her long COVID recovery process.  She is not falling to floor.  But when she stands up and starts moving example: walking towards a door and then leans on the door or leans on the wall for balance; sometimes she is weaving when she ambulates.  She notices she veers to one side more than the other.  She has a consult with [facility] neurolog[facility] in March 2025 for a consult.  She had seen a neurologist earlier in the long COVID recovery process.  She has also seen her cardiologist and r/o cardiac reasons.    She has not been able to exercise a lot since COVID.  She still has sort term memory issues too.  She does walk 1.5 miles- flat neighborhood; she has not done any strength training.  If she "overdoes it" she will have a flare up for 2 days.    She is not lightheaded, no dizziness noted.  Pt accompanied by: self  PERTINENT HISTORY: Jan 2021- had COVID and long COVID sx; spent 6 months lying bed, had a tough recovery; has had exploratory R ankle arthroscopy, no other LE  or spine injuries or surgeries.  Retired Teacher, early years/pre   PAIN:  Are you having pain? No  PRECAUTIONS: Fall  RED FLAGS: None   WEIGHT BEARING RESTRICTIONS: No  FALLS: Has patient fallen in last 6 months? No, but catches herself with the wall/doorway  LIVING ENVIRONMENT: Lives with: lives with their spouse Lives in: House/apartment Stairs: yes, 3 story home- she is concerned about her balance going down the stairs- does not feel stable and leans to the R when going down the stairs; sometimes has R ankle pain while going down the stairs- has rail on the L but not on the R; has 3 steps to enter Has following equipment at home: None  PLOF: Independent  PATIENT GOALS: to improve balance  OBJECTIVE:  Note: Objective measures  were completed at Evaluation unless otherwise noted. (Portions  deferred secondary to time constraints as pt arrived late to appointment)  COGNITION: Overall cognitive status: Within functional limits for tasks assessed   SENSATION: Not tested  COORDINATION: deferred  POSTURE: rounded shoulders  LOWER EXTREMITY ROM:   functionally able to perform sit to stand independently  Active  Right Eval Left Eval  Hip flexion    Hip extension    Hip abduction    Hip adduction    Hip internal rotation    Hip external rotation    Knee flexion    Knee extension    Ankle dorsiflexion    Ankle plantarflexion    Ankle inversion    Ankle eversion     (Blank rows = not tested)  LOWER EXTREMITY MMT:    MMT Right Eval Left Eval  Hip flexion 4 4  Hip extension 4 4  Hip abduction    Hip adduction    Hip internal rotation    Hip external rotation    Knee flexion 4 4  Knee extension 4 4  Ankle dorsiflexion    Ankle plantarflexion    Ankle inversion    Ankle eversion    (Blank rows = not tested)   GAIT:  Will further assess at next visit  FUNCTIONAL TESTS:  5 times sit to stand: 15 seconds SL balance: R x 20 seconds, L x 12 seconds  PATIENT SURVEYS:  FGA to be administered at second visit                                                                                                                              TREATMENT DATE: 05/04/23 Subjective: Pt reports No falls since previous session.  She felt off balance and threw arms out for stability when she was standing outside and turned to talk to people on different sides of her.  Pain: 0/10  Objective: 5x STS: 9 seconds (was 15 seconds at initial evaluation) LE MMT: hip flexion 5/5 b/l (was 4/5 at initial evaluation) SLS balance R x 28 seconds, L x 14 seconds (was 20 and 12 at initial evaluation)  Functional Gait Assessment (FGA) administered: to be administered as re-test next session At initial evaluation- score 22/30 (indicates cut off for fall risk) Difficulty on stairs, amb  with eyes closed, tandem walk, stepping over obstacle, amb with head turns horizontally>vertically  Modified Clinical Test of Sensory Interaction for Balance    (CTSIB): to be administered at next session  CONDITION TIME STRATEGY SWAY  Eyes open, firm surface 30 seconds ankle normal  Eyes closed, firm surface 30 seconds ankle Increased sway  Eyes open, foam surface 30 seconds ankle Increased sway  Eyes closed, foam surface 30 seconds Ankle, hip, trunk Increased sway, reaches for b/l parallel bars    Neuro re-ed:  SLS: 3x ea x 12-20 sec intervals  Tandem stance, both ways, 3x with 5 lb med ball overhead lift x  5 reps, 2 sets  Blue ladder on floor: alternating high marches forward 3 laps, lateral high knees 2 laps, diagonal each direction 2 laps ea- not today Sit to stand with 360 deg turn, 5 lb medball toss from PT, turn opposite way, pass ball back to PT then sit: 3x CW and 3X CCW, repeated 2 sets Ball toss at trampoline (feet together) on airex: forward, 5 lbs 3x30 seconds Feet together on airex: head turns R/L 30 second intervals x 3, up/down 30 second intervals x3 (PT close supervision) Figure 8 narrow 8 laps- pt notes no dizziness today VOR: feet together 3x 30 seconds R/L and up/down standing Eyes R/L to target x10 ea, 3 set, standing feet together Amb in hallway head turns R/L 2 laps and up/down 2 laps  Therapeutic Exercise: Sit to stand 2x5 Ascend/descend stairs: using single railing, x 4 laps Stepping over obstacles: 4 laps in parallel bars (multi height hurdles)- not today Squats holding 9 lbs 2x10 (chair behind her), added 3 second holds Heel raises: 2x10 Toe raises: 2x10 Front step ups on 6 inch step: LRRL, RLLR x10 ea Standing hamstring curl: 5# 2x12 Seated knee extension: 5# 2x12  Deadlifts: 2x7# dumbbells, 2x15  HEP instruction- see below  PATIENT EDUCATION: Education details: PT POC/tx for balance and strength deficits Person educated: Patient Education method:  Explanation Education comprehension: verbalized understanding  HOME EXERCISE PROGRAM: Access Code: [email or web address] URL: [email or web address] Date: 03/09/2023 Prepared by: Max Fickle  Exercises - Sit to Stand Without Arm Support  - 1 x daily - 7 x weekly - 4 sets - 5 reps - Tandem Walking with Counter Support  - 1 x daily - 7 x weekly - 4 sets - Single Leg Stance  - 1 x daily - 7 x weekly - 3-5 reps - 15 hold - Seated Gaze Stabilization with Head Nod  - 1 x daily - 7 x weekly - 30 reps - 30 sec hold - Seated Gaze Stabilization with Head Rotation  - 1 x daily - 7 x weekly - 3 reps - 30 sec hold   GOALS: Goals reviewed with patient? Yes  SHORT TERM GOALS: Target date: 03/28/23  Pt will demonstrate ability to perform HEP for LE strengthening and balance exercises with proper technique/form in clinic Baseline: pt is not formally performing strength exercises currently at home; 04/27/23 in progress Goal status: In progress  LONG TERM GOALS: Target date: 06/08/23  Improve LE strength 1/2 MMT grade to facilitate improved ability to ambulate down her hallway at home and descend/ascend stairs in her home without losing balance Baseline: 4/5; 5/5 hip flexion (not knee extension or flex yet- still 4/5), ascending and descending stairs with improved balance, but hasn't been home to trial this in a few weeks Goal status: In progress  2.  Improve FGA score to >23 indicating decreased fall risk during ambulation Baseline: to be administered at 2nd visit; will modifiy this goal accordingly if needed Goal status: In progress  3.  Improve 5x STS time to <15 seconds indicating decreased fall risk in population age >16 y/o Baseline: 15; 04/27/23: 9 seconds best trial, 11 seconds other trial Goal status: MET   ASSESSMENT:  CLINICAL IMPRESSION: Pt's gait deviates slightly to R/L when amb with head turns, but no crossing foot over midline noted today.  She is still challenged with LE  strengthening; has difficulty with balance in SLS on L compared to R, and tandem stance.  Pt remains very motivated to participate in PT.  Has likely not ye obtained max benefit from PT.  She is an appropriate candidate for continued skilled PT to address balance, strength, gait deficits to improve safety and reduce fall risk during amb.  OBJECTIVE IMPAIRMENTS: Abnormal gait, decreased activity tolerance, decreased balance, decreased endurance, decreased strength, and impaired perceived functional ability.   ACTIVITY LIMITATIONS: standing, transfers, and locomotion level  PARTICIPATION LIMITATIONS: cleaning, interpersonal relationship, shopping, community activity, and yard work  PERSONAL FACTORS: Age, Time since onset of injury/illness/exacerbation, and 1-2 comorbidities: long COVID, orthopedic hx (see above)  are also affecting patient's functional outcome.   REHAB POTENTIAL: Good  CLINICAL DECISION MAKING: Evolving/moderate complexity  EVALUATION COMPLEXITY: Moderate  PLAN:  PT FREQUENCY: 2x/week  PT DURATION: 6 weeks  PLANNED INTERVENTIONS: 97110-Therapeutic exercises, 97530- Therapeutic activity, 97112- Neuromuscular re-education, 97535- Self Care, and 16109- Manual therapy  PLAN FOR NEXT SESSION: continue LE strengthening and balance exercises- emphasizing uneven surfaces, dynamic, dual tasking activities; VOR; multi stepped or sequenced/stacked complex balance exercises  Max Fickle, PT, DPT, OCS  Ardine Bjork, PT 05/04/2023, 12:50 PM

## 2023-05-06 ENCOUNTER — Ambulatory Visit

## 2023-05-06 DIAGNOSIS — R2689 Other abnormalities of gait and mobility: Secondary | ICD-10-CM | POA: Diagnosis not present

## 2023-05-06 NOTE — Therapy (Signed)
 OUTPATIENT PHYSICAL THERAPY NEURO TREATMENT/MEDICARE PROGRESS NOTE/RE-CERT THROUGH 06/08/23   Patient Name: Amber Richardson MRN: [id number] DOB:Jul 11, 1955, 68 y.o., female Today's Date: 05/06/2023  PCP: Dr. Geralyn Knee, MD  REFERRING PROVIDER: same  END OF SESSION:  PT End of Session - 05/06/23 1453     Visit Number 13    Number of Visits 22    Date for PT Re-Evaluation 06/08/23    Authorization Type 2x/week x 6 weeks (PN recert on 06/08/23) PN and recert done on 04/27/23    Authorization Time Period Medicare, PN needed at visit #20    PT Start Time 1250    PT Stop Time 1335    PT Time Calculation (min) 45 min    Equipment Utilized During Treatment Gait belt    Activity Tolerance Patient tolerated treatment well    Behavior During Therapy [facility] for tasks assessed/performed              Past Medical History:  Diagnosis Date   Allergies    Basal cell carcinoma    Fatigue 08/30/2014   Generalized anxiety disorder 03/21/2011   GERD (gastroesophageal reflux disease)    History of COVID-19 02/22/2019   HLD (hyperlipidemia)    HSV infection 09/20/2014   Hypercholesterolemia 02/08/2015   Hyperglycemia 08/28/2019   Hyperthyroidism    Major depressive disorder    Osteoporosis 05/13/2015   Pyelonephritis 1985   Scalp, arm, and leg tingling 08/28/2019   Tendinosis 09/12/2013   Rotator cuff tendinosis. Mild capsulitis.   Tremor 08/28/2019   Subtle to mild, upper extremities, left worse than right   Past Surgical History:  Procedure Laterality Date   DILATION AND CURETTAGE OF UTERUS     Miscarriage     x1   TONSILLECTOMY  1963   Patient Active Problem List   Diagnosis Date Noted   Balance problem 01/02/2023   Poison ivy dermatitis 09/26/2022   Urinary frequency 09/26/2022   Abnormal liver function tests 08/22/2022   Flank pain 08/22/2022   Cold intolerance 08/11/2022   Concussion 08/11/2022   Diarrhea 03/26/2022   Headache 01/29/2022   Melanoma in situ (HCC) 06/08/2021    Contact dermatitis and eczema due to plant 05/31/2021   Healthcare maintenance 11/11/2020   Rib pain on right side 09/30/2020   Joint pain 09/19/2020   Chest congestion 06/17/2020   Joint stiffness 06/17/2020   Change in vision 06/17/2020   Unsteady gait 06/17/2020   Moderate episode of recurrent major depressive disorder (HCC) 06/17/2020   Muscle weakness 06/13/2020   Stress 11/14/2019   Breast cancer screening 11/14/2019   Major depressive disorder    Disorder of bursae of shoulder region 09/01/2019   Knee pain 09/01/2019   Low back pain 09/01/2019   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   Tremor 08/28/2019   Hyperglycemia 08/28/2019   History of COVID-19 02/20/2019   Left arm pain 01/09/2019   Neck pain 09/04/2018   Dizziness 12/19/2017   Dyspnea 09/13/2017   Left facial numbness 06/14/2015   Osteoporosis 05/13/2015   Hypercholesterolemia 02/08/2015   Left foot pain 11/04/2014   Atrophic vaginitis 09/20/2014   HSV infection 09/20/2014   Pelvic pain in female 09/20/2014   Fatigue 08/30/2014   Chest pain 08/30/2014   Abdominal pain 08/29/2014   Tendinosis 09/12/2013   Basal cell carcinoma 03/17/2012   Hyperthyroidism 03/17/2012   Fibroids 01/19/2012   BV (bacterial vaginosis) 01/05/2012   PMB (postmenopausal bleeding) 01/05/2012   Generalized anxiety disorder 03/21/2011   GERD (gastroesophageal reflux disease) 03/21/2011  ONSET DATE: 2021  REFERRING DIAG: R26.89 (ICD-10-CM) - Balance problem   THERAPY DIAG:  Balance problem  Rationale for Evaluation and Treatment: Rehabilitation  SUBJECTIVE: From initial evaluation note 03/02/23                                                                                                                                                                                            SUBJECTIVE STATEMENT: Pt states she is having difficulty with her balance.  She has been noticing it for several months.  It is progessively worsening.  She  feels unsteady on her feet at times while walking and has to lean into the wall or reach for support.  She attributes the balance changes to her long COVID recovery process.  She is not falling to floor.  But when she stands up and starts moving example: walking towards a door and then leans on the door or leans on the wall for balance; sometimes she is weaving when she ambulates.  She notices she veers to one side more than the other.  She has a consult with [facility] neurology in March 2025 for a consult.  She had seen a neurologist earlier in the long COVID recovery process.  She has also seen her cardiologist and r/o cardiac reasons.    She has not been able to exercise a lot since COVID.  She still has sort term memory issues too.  She does walk 1.5 miles- [place] neighborhood; she has not done any strength training.  If she "overdoes it" she will have a flare up for 2 days.    She is not lightheaded, no dizziness noted.  Pt accompanied by: self  PERTINENT HISTORY: Jan 2021- had COVID and long COVID sx; spent 6 months lying bed, had a tough recovery; has had exploratory R ankle arthroscopy, no other LE  or spine injuries or surgeries.  Retired Teacher, early years/pre   PAIN:  Are you having pain? No  PRECAUTIONS: Fall  RED FLAGS: None   WEIGHT BEARING RESTRICTIONS: No  FALLS: Has patient fallen in last 6 months? No, but catches herself with the wall/doorway  LIVING ENVIRONMENT: Lives with: lives with their spouse Lives in: House/apartment Stairs: yes, 3 story home- she is concerned about her balance going down the stairs- does not feel stable and leans to the R when going down the stairs; sometimes has R ankle pain while going down the stairs- has rail on the L but not on the R; has 3 steps to enter Has following equipment at home: None  PLOF: Independent  PATIENT GOALS: to improve balance  OBJECTIVE:  Note: Objective measures  were completed at Evaluation unless otherwise noted. (Portions  deferred secondary to time constraints as pt arrived late to appointment)  COGNITION: Overall cognitive status: Within functional limits for tasks assessed   SENSATION: Not tested  COORDINATION: deferred  POSTURE: rounded shoulders  LOWER EXTREMITY ROM:   functionally able to perform sit to stand independently  Active  Right Eval Left Eval  Hip flexion    Hip extension    Hip abduction    Hip adduction    Hip internal rotation    Hip external rotation    Knee flexion    Knee extension    Ankle dorsiflexion    Ankle plantarflexion    Ankle inversion    Ankle eversion     (Blank rows = not tested)  LOWER EXTREMITY MMT:    MMT Right Eval Left Eval  Hip flexion 4 4  Hip extension 4 4  Hip abduction    Hip adduction    Hip internal rotation    Hip external rotation    Knee flexion 4 4  Knee extension 4 4  Ankle dorsiflexion    Ankle plantarflexion    Ankle inversion    Ankle eversion    (Blank rows = not tested)   GAIT:  Will further assess at next visit  FUNCTIONAL TESTS:  5 times sit to stand: 15 seconds SL balance: R x 20 seconds, L x 12 seconds  PATIENT SURVEYS:  FGA to be administered at second visit                                                                                                                              TREATMENT DATE: 05/06/23 Subjective: Pt reports No falls since previous session.  She did lean heavily into door jam on R cheek when she lost her balance walking around the corner through a doorway since last session.  Pain: 0/10  Objective: 5x STS: 9 seconds (was 15 seconds at initial evaluation) LE MMT: hip flexion 5/5 b/l (was 4/5 at initial evaluation) SLS balance R x 28 seconds, L x 14 seconds (was 20 and 12 at initial evaluation)  Functional Gait Assessment (FGA) administered: to be administered as re-test next session At initial evaluation- score 22/30 (indicates cut off for fall risk) Difficulty on stairs, amb with  eyes closed, tandem walk, stepping over obstacle, amb with head turns horizontally>vertically  Modified Clinical Test of Sensory Interaction for Balance    (CTSIB): to be administered at next session  CONDITION TIME STRATEGY SWAY  Eyes open, firm surface 30 seconds ankle normal  Eyes closed, firm surface 30 seconds ankle Increased sway  Eyes open, foam surface 30 seconds ankle Increased sway  Eyes closed, foam surface 30 seconds Ankle, hip, trunk Increased sway, reaches for b/l parallel bars    Neuro re-ed:  SLS: 3x ea x 12-20 sec intervals  Tandem stance, both ways, 3x with 5 lb med ball overhead lift x 5  reps, 2 sets  Blue ladder on floor: alternating high marches forward 3 laps, lateral high knees 2 laps, diagonal each direction 2 laps ea- not today Sit to stand with 360 deg turn, 5 lb medball toss from PT, turn opposite way, pass ball back to PT then sit: 3x CW and 3X CCW, repeated 2 sets Ball toss at trampoline (feet together) on airex: forward, 5 lbs 3x30 seconds- not today Feet together on airex: head turns R/L 30 second intervals x 3, up/down 30 second intervals x3 (PT close supervision) Weave in/out of cones- turning directions, practiced keeping eyes ahead, 4 laps VOR: feet together 3x 30 seconds R/L and up/down standing Eyes R/L to target x10 ea, 3 set, standing feet together Amb in hallway head turns R/L 2 laps and up/down 2 laps Sit to stand and then amb around corner of doorways- practiced R and L then amb down hallway and 180 deg pivot turn.  Practiced keeping eyes ahead to scan environment instead of looking at feet. Bosu: step ups- RLLR, and LRRL x15 ea  Therapeutic Exercise: Sit to stand 2x5 Ascend/descend stairs: using single railing, x 4 laps Stepping over obstacles: 4 laps in parallel bars (multi height hurdles) Squats holding 9 lbs 2x10 (chair behind her), added 3 second holds Heel raises: 2x10 Toe raises: 2x10 Front step ups on 6 inch step: LRRL, RLLR x10  ea Standing hamstring curl: 5# 2x12 Seated knee extension: 5# 2x12  Deadlifts: 2x7# dumbbells, 2x15- not today  HEP instruction- see below  PATIENT EDUCATION: Education details: PT POC/tx for balance and strength deficits Person educated: Patient Education method: Explanation Education comprehension: verbalized understanding  HOME EXERCISE PROGRAM: Access Code: [email or web address] URL: [email or web address] Date: 03/09/2023 Prepared by: Lucrecia Sables  Exercises - Sit to Stand Without Arm Support  - 1 x daily - 7 x weekly - 4 sets - 5 reps - Tandem Walking with Counter Support  - 1 x daily - 7 x weekly - 4 sets - Single Leg Stance  - 1 x daily - 7 x weekly - 3-5 reps - 15 hold - Seated Gaze Stabilization with Head Nod  - 1 x daily - 7 x weekly - 30 reps - 30 sec hold - Seated Gaze Stabilization with Head Rotation  - 1 x daily - 7 x weekly - 3 reps - 30 sec hold   GOALS: Goals reviewed with patient? Yes  SHORT TERM GOALS: Target date: 03/28/23  Pt will demonstrate ability to perform HEP for LE strengthening and balance exercises with proper technique/form in clinic Baseline: pt is not formally performing strength exercises currently at home; 04/27/23 in progress Goal status: In progress  LONG TERM GOALS: Target date: 06/08/23  Improve LE strength 1/2 MMT grade to facilitate improved ability to ambulate down her hallway at home and descend/ascend stairs in her home without losing balance Baseline: 4/5; 5/5 hip flexion (not knee extension or flex yet- still 4/5), ascending and descending stairs with improved balance, but hasn't been home to trial this in a few weeks Goal status: In progress  2.  Improve FGA score to >23 indicating decreased fall risk during ambulation Baseline: to be administered at 2nd visit; will modifiy this goal accordingly if needed Goal status: In progress  3.  Improve 5x STS time to <15 seconds indicating decreased fall risk in population age >51  y/o Baseline: 15; 04/27/23: 9 seconds best trial, 11 seconds other trial Goal status: MET   ASSESSMENT:  CLINICAL IMPRESSION: Pt able to  maintain more steady gait while amb around doorway with turns when she uses vision to scan environment vs looking at feet/ground today.  Discussed benefits of ways to apply this into real life outside of clinic to improve safety and reduce fall risk.  Demonstrates improved trunk/hip/ankle strategies for balance on bosu during today's session.  She sees neurology for a consult next week.  Pt remains very motivated to participate in PT.  Has likely not yet obtained max benefit from PT.  She is an appropriate candidate for continued skilled PT to address balance, strength, gait deficits to improve safety and reduce fall risk during amb.  OBJECTIVE IMPAIRMENTS: Abnormal gait, decreased activity tolerance, decreased balance, decreased endurance, decreased strength, and impaired perceived functional ability.   ACTIVITY LIMITATIONS: standing, transfers, and locomotion level  PARTICIPATION LIMITATIONS: cleaning, interpersonal relationship, shopping, community activity, and yard work  PERSONAL FACTORS: Age, Time since onset of injury/illness/exacerbation, and 1-2 comorbidities: long COVID, orthopedic hx (see above)  are also affecting patient's functional outcome.   REHAB POTENTIAL: Good  CLINICAL DECISION MAKING: Evolving/moderate complexity  EVALUATION COMPLEXITY: Moderate  PLAN:  PT FREQUENCY: 2x/week  PT DURATION: 6 weeks  PLANNED INTERVENTIONS: 97110-Therapeutic exercises, 97530- Therapeutic activity, 97112- Neuromuscular re-education, 97535- Self Care, and 16109- Manual therapy  PLAN FOR NEXT SESSION: continue LE strengthening and balance exercises- emphasizing uneven surfaces, dynamic, dual tasking activities; VOR; multi stepped or sequenced/stacked complex balance exercises  Lucrecia Sables, PT, DPT, OCS  Kin Penner, PT 05/06/2023, 2:54  PM

## 2023-05-11 ENCOUNTER — Ambulatory Visit

## 2023-05-11 DIAGNOSIS — R2689 Other abnormalities of gait and mobility: Secondary | ICD-10-CM | POA: Diagnosis not present

## 2023-05-11 NOTE — Therapy (Signed)
 OUTPATIENT PHYSICAL THERAPY NEURO TREATMENT/MEDICARE PROGRESS NOTE/RE-CERT THROUGH 06/08/23   Patient Name: Amber Richardson MRN: [id number] DOB:03-26-1955, 68 y.o., female Today's Date: 05/11/2023  PCP: Dr. Geralyn Knee, MD  REFERRING PROVIDER: same  END OF SESSION:  PT End of Session - 05/11/23 1412     Visit Number 14    Number of Visits 22    Date for PT Re-Evaluation 06/08/23    Authorization Type 2x/week x 6 weeks (PN recert on 06/08/23) PN and recert done on 04/27/23    Authorization Time Period Medicare, PN needed at visit #20    PT Start Time 1250    PT Stop Time 1335    PT Time Calculation (min) 45 min    Equipment Utilized During Treatment Gait belt    Activity Tolerance Patient tolerated treatment well    Behavior During Therapy [facility] for tasks assessed/performed              Past Medical History:  Diagnosis Date   Allergies    Basal cell carcinoma    Fatigue 08/30/2014   Generalized anxiety disorder 03/21/2011   GERD (gastroesophageal reflux disease)    History of COVID-19 02/22/2019   HLD (hyperlipidemia)    HSV infection 09/20/2014   Hypercholesterolemia 02/08/2015   Hyperglycemia 08/28/2019   Hyperthyroidism    Major depressive disorder    Osteoporosis 05/13/2015   Pyelonephritis 1985   Scalp, arm, and leg tingling 08/28/2019   Tendinosis 09/12/2013   Rotator cuff tendinosis. Mild capsulitis.   Tremor 08/28/2019   Subtle to mild, upper extremities, left worse than right   Past Surgical History:  Procedure Laterality Date   DILATION AND CURETTAGE OF UTERUS     Miscarriage     x1   TONSILLECTOMY  1963   Patient Active Problem List   Diagnosis Date Noted   Balance problem 01/02/2023   Poison ivy dermatitis 09/26/2022   Urinary frequency 09/26/2022   Abnormal liver function tests 08/22/2022   Flank pain 08/22/2022   Cold intolerance 08/11/2022   Concussion 08/11/2022   Diarrhea 03/26/2022   Headache 01/29/2022   Melanoma in situ (HCC) 06/08/2021    Contact dermatitis and eczema due to plant 05/31/2021   Healthcare maintenance 11/11/2020   Rib pain on right side 09/30/2020   Joint pain 09/19/2020   Chest congestion 06/17/2020   Joint stiffness 06/17/2020   Change in vision 06/17/2020   Unsteady gait 06/17/2020   Moderate episode of recurrent major depressive disorder (HCC) 06/17/2020   Muscle weakness 06/13/2020   Stress 11/14/2019   Breast cancer screening 11/14/2019   Major depressive disorder    Disorder of bursae of shoulder region 09/01/2019   Knee pain 09/01/2019   Low back pain 09/01/2019   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   Tremor 08/28/2019   Hyperglycemia 08/28/2019   History of COVID-19 02/20/2019   Left arm pain 01/09/2019   Neck pain 09/04/2018   Dizziness 12/19/2017   Dyspnea 09/13/2017   Left facial numbness 06/14/2015   Osteoporosis 05/13/2015   Hypercholesterolemia 02/08/2015   Left foot pain 11/04/2014   Atrophic vaginitis 09/20/2014   HSV infection 09/20/2014   Pelvic pain in female 09/20/2014   Fatigue 08/30/2014   Chest pain 08/30/2014   Abdominal pain 08/29/2014   Tendinosis 09/12/2013   Basal cell carcinoma 03/17/2012   Hyperthyroidism 03/17/2012   Fibroids 01/19/2012   BV (bacterial vaginosis) 01/05/2012   PMB (postmenopausal bleeding) 01/05/2012   Generalized anxiety disorder 03/21/2011   GERD (gastroesophageal reflux disease) 03/21/2011  ONSET DATE: 2021  REFERRING DIAG: R26.89 (ICD-10-CM) - Balance problem   THERAPY DIAG:  Balance problem  Rationale for Evaluation and Treatment: Rehabilitation  SUBJECTIVE: From initial evaluation note 03/02/23                                                                                                                                                                                            SUBJECTIVE STATEMENT: Pt states she is having difficulty with her balance.  She has been noticing it for several months.  It is progessively worsening.  She  feels unsteady on her feet at times while walking and has to lean into the wall or reach for support.  She attributes the balance changes to her long COVID recovery process.  She is not falling to floor.  But when she stands up and starts moving example: walking towards a door and then leans on the door or leans on the wall for balance; sometimes she is weaving when she ambulates.  She notices she veers to one side more than the other.  She has a consult with [facility] neurology in March 2025 for a consult.  She had seen a neurologist earlier in the long COVID recovery process.  She has also seen her cardiologist and r/o cardiac reasons.    She has not been able to exercise a lot since COVID.  She still has sort term memory issues too.  She does walk 1.5 miles- [place] neighborhood; she has not done any strength training.  If she "overdoes it" she will have a flare up for 2 days.    She is not lightheaded, no dizziness noted.  Pt accompanied by: self  PERTINENT HISTORY: Jan 2021- had COVID and long COVID sx; spent 6 months lying bed, had a tough recovery; has had exploratory R ankle arthroscopy, no other LE  or spine injuries or surgeries.  Retired Teacher, early years/pre   PAIN:  Are you having pain? No  PRECAUTIONS: Fall  RED FLAGS: None   WEIGHT BEARING RESTRICTIONS: No  FALLS: Has patient fallen in last 6 months? No, but catches herself with the wall/doorway  LIVING ENVIRONMENT: Lives with: lives with their spouse Lives in: House/apartment Stairs: yes, 3 story home- she is concerned about her balance going down the stairs- does not feel stable and leans to the R when going down the stairs; sometimes has R ankle pain while going down the stairs- has rail on the L but not on the R; has 3 steps to enter Has following equipment at home: None  PLOF: Independent  PATIENT GOALS: to improve balance  OBJECTIVE:  Note: Objective measures  were completed at Evaluation unless otherwise noted. (Portions  deferred secondary to time constraints as pt arrived late to appointment)  COGNITION: Overall cognitive status: Within functional limits for tasks assessed   SENSATION: Not tested  COORDINATION: deferred  POSTURE: rounded shoulders  LOWER EXTREMITY ROM:   functionally able to perform sit to stand independently  Active  Right Eval Left Eval  Hip flexion    Hip extension    Hip abduction    Hip adduction    Hip internal rotation    Hip external rotation    Knee flexion    Knee extension    Ankle dorsiflexion    Ankle plantarflexion    Ankle inversion    Ankle eversion     (Blank rows = not tested)  LOWER EXTREMITY MMT:    MMT Right Eval Left Eval  Hip flexion 4 4  Hip extension 4 4  Hip abduction    Hip adduction    Hip internal rotation    Hip external rotation    Knee flexion 4 4  Knee extension 4 4  Ankle dorsiflexion    Ankle plantarflexion    Ankle inversion    Ankle eversion    (Blank rows = not tested)   GAIT:  Will further assess at next visit  FUNCTIONAL TESTS:  5 times sit to stand: 15 seconds SL balance: R x 20 seconds, L x 12 seconds  PATIENT SURVEYS:  FGA to be administered at second visit                                                                                                                              TREATMENT DATE: 05/11/23 Subjective: Pt reports her neurology consult is on Wednesday this week  Pain: 0/10  Objective: 5x STS: 9 seconds (was 15 seconds at initial evaluation) LE MMT: hip flexion 5/5 b/l (was 4/5 at initial evaluation) SLS balance R x 28 seconds, L x 14 seconds (was 20 and 12 at initial evaluation)  Functional Gait Assessment (FGA) administered: to be administered as re-test next session At initial evaluation- score 22/30 (indicates cut off for fall risk) Difficulty on stairs, amb with eyes closed, tandem walk, stepping over obstacle, amb with head turns horizontally>vertically  Modified Clinical Test of  Sensory Interaction for Balance    (CTSIB): to be administered at next session  CONDITION TIME STRATEGY SWAY  Eyes open, firm surface 30 seconds ankle normal  Eyes closed, firm surface 30 seconds ankle Increased sway  Eyes open, foam surface 30 seconds ankle Increased sway  Eyes closed, foam surface 30 seconds Ankle, hip, trunk Increased sway, reaches for b/l parallel bars    Neuro re-ed:  Pt reports onset of upper back pain/parasthesias/chills that radiate into arms, R face sensory changes upon sitting on tx table; she reports "episodes" like this happen occasionally; sometimes it is triggered by cold air- no change in vision, speech, pt alert and oriented x 4,  no changes in facial mm/UE/LE strength or ROM, gait, or balance.  Assessed vital signs: SpO2 98%, HR 58 bpm, BP 120/57.  Discussed option to go to urgent care for further evaluation and pt declined stating she would be dismissed or brushed off.  Would like to wait for her neurology consult on Wednesday.  Would like to continue with PT session today as sx resolved within a few minutes.  Vital signs were in normal ranges for exercise.  Performed abbreviated session including therapeutic exercises and neuro reed today.  Continued to monitor throughout session; Pt able to complete whole session without any sx returning.  No worsening or return of sx during or at end of session.  Pt educated on signs/sx of CVA (FAST) and instructed to go to ER or call 911 of she noticed any of these.  She has neurology consult later this week; will plan to resume PT after that appointment.  SLS: 3x ea x 12-20 sec intervals - not today Tandem stance, both ways, 3x with 5 lb med ball overhead lift x 5 reps, 2 sets - not today Blue ladder on floor: alternating high marches forward 3 laps, lateral high knees 2 laps, diagonal each direction 2 laps ea- not today Sit to stand with 360 deg turn, 5 lb medball toss from PT, turn opposite way, pass ball back to PT then sit:  3x CW and 3X CCW, repeated 2 sets Ball toss at trampoline (feet together) on airex: forward, 5 lbs 3x30 seconds- not today Feet together on airex: head turns R/L 30 second intervals x 3, up/down 30 second intervals x3 (PT close supervision) Weave in/out of cones- turning directions, practiced keeping eyes ahead, 4 laps VOR: feet together 3x 30 seconds R/L and up/down standing Eyes R/L to target x10 ea, 3 set, standing feet together Amb in hallway head turns R/L 2 laps and up/down 2 laps Sit to stand and then amb around corner of doorways- practiced R and L then amb down hallway and 180 deg pivot turn.  Practiced keeping eyes ahead to scan environment instead of looking at feet. Bosu: step ups- RLLR, and LRRL x15 ea  Therapeutic Exercise: Sit to stand 2x5 Ascend/descend stairs: using single railing, x 4 laps Stepping over obstacles: 4 laps in parallel bars (multi height hurdles) Squats holding 9 lbs 2x10 (chair behind her), added 3 second holds Heel raises: 2x10 Toe raises: 2x10 Front step ups on 6 inch step: LRRL, RLLR x10 ea Standing hamstring curl: 5# 2x12 Seated knee extension: 5# 2x12  Deadlifts: 2x7# dumbbells, 2x15- not today  HEP instruction- see below  PATIENT EDUCATION: Education details: PT POC/tx for balance and strength deficits Person educated: Patient Education method: Explanation Education comprehension: verbalized understanding  HOME EXERCISE PROGRAM: Access Code: [email or web address] URL: [email or web address] Date: 03/09/2023 Prepared by: Lucrecia Sables  Exercises - Sit to Stand Without Arm Support  - 1 x daily - 7 x weekly - 4 sets - 5 reps - Tandem Walking with Counter Support  - 1 x daily - 7 x weekly - 4 sets - Single Leg Stance  - 1 x daily - 7 x weekly - 3-5 reps - 15 hold - Seated Gaze Stabilization with Head Nod  - 1 x daily - 7 x weekly - 30 reps - 30 sec hold - Seated Gaze Stabilization with Head Rotation  - 1 x daily - 7 x weekly - 3 reps - 30  sec hold   GOALS: Goals reviewed with patient? Yes  SHORT TERM GOALS: Target date: 03/28/23  Pt will demonstrate ability to perform HEP for LE strengthening and balance exercises with proper technique/form in clinic Baseline: pt is not formally performing strength exercises currently at home; 04/27/23 in progress Goal status: In progress  LONG TERM GOALS: Target date: 06/08/23  Improve LE strength 1/2 MMT grade to facilitate improved ability to ambulate down her hallway at home and descend/ascend stairs in her home without losing balance Baseline: 4/5; 5/5 hip flexion (not knee extension or flex yet- still 4/5), ascending and descending stairs with improved balance, but hasn't been home to trial this in a few weeks Goal status: In progress  2.  Improve FGA score to >23 indicating decreased fall risk during ambulation Baseline: to be administered at 2nd visit; will modifiy this goal accordingly if needed Goal status: In progress  3.  Improve 5x STS time to <15 seconds indicating decreased fall risk in population age >92 y/o Baseline: 15; 04/27/23: 9 seconds best trial, 11 seconds other trial Goal status: MET   ASSESSMENT:  CLINICAL IMPRESSION: Pt reports onset of upper back pain/parasthesias/chills that radiate into arms, R face sensory changes upon sitting on tx table; she reports "episodes" like this happen occasionally; sometimes it is triggered by cold air- no change in vision, speech, pt alert and oriented x 4, no changes in facial mm/UE/LE strength or ROM, gait, or balance.  Assessed vital signs: SpO2 98%, HR 58 bpm, BP 120/57.  Discussed option to go to urgent care for further evaluation and pt declined stating she would be dismissed or brushed off.  Would like to wait for her neurology consult on Wednesday.  Would like to continue with PT session today as sx resolved within a few minutes.  Vital signs were in normal ranges for exercise.  Performed abbreviated session including  therapeutic exercises and neuro reed today.  Continued to monitor throughout session; Pt able to complete whole session without any sx returning.  No worsening or return of sx during or at end of session.  Pt educated on signs/sx of CVA (FAST) and instructed to go to ER or call 911 of she noticed any of these.  She has neurology consult later this week; will plan to resume PT after that appointment.  OBJECTIVE IMPAIRMENTS: Abnormal gait, decreased activity tolerance, decreased balance, decreased endurance, decreased strength, and impaired perceived functional ability.   ACTIVITY LIMITATIONS: standing, transfers, and locomotion level  PARTICIPATION LIMITATIONS: cleaning, interpersonal relationship, shopping, community activity, and yard work  PERSONAL FACTORS: Age, Time since onset of injury/illness/exacerbation, and 1-2 comorbidities: long COVID, orthopedic hx (see above)  are also affecting patient's functional outcome.   REHAB POTENTIAL: Good  CLINICAL DECISION MAKING: Evolving/moderate complexity  EVALUATION COMPLEXITY: Moderate  PLAN:  PT FREQUENCY: 2x/week  PT DURATION: 6 weeks  PLANNED INTERVENTIONS: 97110-Therapeutic exercises, 97530- Therapeutic activity, 97112- Neuromuscular re-education, 97535- Self Care, and [id number]- Manual therapy  PLAN FOR NEXT SESSION: continue LE strengthening and balance exercises- emphasizing uneven surfaces, dynamic, dual tasking activities; VOR; multi stepped or sequenced/stacked complex balance exercises  Lucrecia Sables, PT, DPT, OCS  Kin Penner, PT 05/11/2023, 2:13 PM

## 2023-05-20 ENCOUNTER — Ambulatory Visit

## 2023-05-20 DIAGNOSIS — R2689 Other abnormalities of gait and mobility: Secondary | ICD-10-CM | POA: Diagnosis not present

## 2023-05-20 NOTE — Therapy (Signed)
 OUTPATIENT PHYSICAL THERAPY NEURO TREATMENT/MEDICARE PROGRESS NOTE/RE-CERT THROUGH 06/08/23   Patient Name: Amber Richardson MRN: [id number] DOB:[birth year], 68 y.o., female Today's Date: 05/20/2023  PCP: Dr. Geralyn Knee, MD  REFERRING PROVIDER: same  END OF SESSION:  PT End of Session - 05/20/23 0947     Visit Number 15    Number of Visits 22    Date for PT Re-Evaluation 06/08/23    Authorization Type 2x/week x 6 weeks (PN recert on 06/08/23) PN and recert done on 04/27/23    Authorization Time Period Medicare, PN needed at visit #20    PT Start Time 0950    PT Stop Time 1035    PT Time Calculation (min) 45 min    Equipment Utilized During Treatment Gait belt    Activity Tolerance Patient tolerated treatment well    Behavior During Therapy [facility] for tasks assessed/performed              Past Medical History:  Diagnosis Date   Allergies    Basal cell carcinoma    Fatigue 08/30/2014   Generalized anxiety disorder 03/21/2011   GERD (gastroesophageal reflux disease)    History of COVID-19 02/22/2019   HLD (hyperlipidemia)    HSV infection 09/20/2014   Hypercholesterolemia 02/08/2015   Hyperglycemia 08/28/2019   Hyperthyroidism    Major depressive disorder    Osteoporosis 05/13/2015   Pyelonephritis 1985   Scalp, arm, and leg tingling 08/28/2019   Tendinosis 09/12/2013   Rotator cuff tendinosis. Mild capsulitis.   Tremor 08/28/2019   Subtle to mild, upper extremities, left worse than right   Past Surgical History:  Procedure Laterality Date   DILATION AND CURETTAGE OF UTERUS     Miscarriage     x1   TONSILLECTOMY  1963   Patient Active Problem List   Diagnosis Date Noted   Balance problem 01/02/2023   Poison ivy dermatitis 09/26/2022   Urinary frequency 09/26/2022   Abnormal liver function tests 08/22/2022   Flank pain 08/22/2022   Cold intolerance 08/11/2022   Concussion 08/11/2022   Diarrhea 03/26/2022   Headache 01/29/2022   Melanoma in situ (HCC) 06/08/2021    Contact dermatitis and eczema due to plant 05/31/2021   Healthcare maintenance 11/11/2020   Rib pain on right side 09/30/2020   Joint pain 09/19/2020   Chest congestion 06/17/2020   Joint stiffness 06/17/2020   Change in vision 06/17/2020   Unsteady gait 06/17/2020   Moderate episode of recurrent major depressive disorder (HCC) 06/17/2020   Muscle weakness 06/13/2020   Stress 11/14/2019   Breast cancer screening 11/14/2019   Major depressive disorder    Disorder of bursae of shoulder region 09/01/2019   Knee pain 09/01/2019   Low back pain 09/01/2019   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   Tremor 08/28/2019   Hyperglycemia 08/28/2019   History of COVID-19 02/20/2019   Left arm pain 01/09/2019   Neck pain 09/04/2018   Dizziness 12/19/2017   Dyspnea 09/13/2017   Left facial numbness 06/14/2015   Osteoporosis 05/13/2015   Hypercholesterolemia 02/08/2015   Left foot pain 11/04/2014   Atrophic vaginitis 09/20/2014   HSV infection 09/20/2014   Pelvic pain in female 09/20/2014   Fatigue 08/30/2014   Chest pain 08/30/2014   Abdominal pain 08/29/2014   Tendinosis 09/12/2013   Basal cell carcinoma 03/17/2012   Hyperthyroidism 03/17/2012   Fibroids 01/19/2012   BV (bacterial vaginosis) 01/05/2012   PMB (postmenopausal bleeding) 01/05/2012   Generalized anxiety disorder 03/21/2011   GERD (gastroesophageal reflux disease) 03/21/2011  ONSET DATE: 2021  REFERRING DIAG: R26.89 (ICD-10-CM) - Balance problem   THERAPY DIAG:  Balance problem  Rationale for Evaluation and Treatment: Rehabilitation  SUBJECTIVE: From initial evaluation note 03/02/23                                                                                                                                                                                            SUBJECTIVE STATEMENT: Pt states she is having difficulty with her balance.  She has been noticing it for several months.  It is progessively worsening.  She  feels unsteady on her feet at times while walking and has to lean into the wall or reach for support.  She attributes the balance changes to her long COVID recovery process.  She is not falling to floor.  But when she stands up and starts moving example: walking towards a door and then leans on the door or leans on the wall for balance; sometimes she is weaving when she ambulates.  She notices she veers to one side more than the other.  She has a consult with [facility] neurolog[facility] in March 2025 for a consult.  She had seen a neurologist earlier in the long COVID recovery process.  She has also seen her cardiologist and r/o cardiac reasons.    She has not been able to exercise a lot since COVID.  She still has sort term memory issues too.  She does walk 1.5 miles- flat neighborhood; she has not done any strength training.  If she "overdoes it" she will have a flare up for 2 days.    She is not lightheaded, no dizziness noted.  Pt accompanied by: self  PERTINENT HISTORY: Jan 2021- had COVID and long COVID sx; spent 6 months lying bed, had a tough recovery; has had exploratory R ankle arthroscopy, no other LE  or spine injuries or surgeries.  Retired Teacher, early years/pre   PAIN:  Are you having pain? No  PRECAUTIONS: Fall  RED FLAGS: None   WEIGHT BEARING RESTRICTIONS: No  FALLS: Has patient fallen in last 6 months? No, but catches herself with the wall/doorway  LIVING ENVIRONMENT: Lives with: lives with their spouse Lives in: House/apartment Stairs: yes, 3 story home- she is concerned about her balance going down the stairs- does not feel stable and leans to the R when going down the stairs; sometimes has R ankle pain while going down the stairs- has rail on the L but not on the R; has 3 steps to enter Has following equipment at home: None  PLOF: Independent  PATIENT GOALS: to improve balance  OBJECTIVE:  Note: Objective measures  were completed at Evaluation unless otherwise noted. (Portions  deferred secondary to time constraints as pt arrived late to appointment)  COGNITION: Overall cognitive status: Within functional limits for tasks assessed   SENSATION: Not tested  COORDINATION: deferred  POSTURE: rounded shoulders  LOWER EXTREMITY ROM:   functionally able to perform sit to stand independently  Active  Right Eval Left Eval  Hip flexion    Hip extension    Hip abduction    Hip adduction    Hip internal rotation    Hip external rotation    Knee flexion    Knee extension    Ankle dorsiflexion    Ankle plantarflexion    Ankle inversion    Ankle eversion     (Blank rows = not tested)  LOWER EXTREMITY MMT:    MMT Right Eval Left Eval  Hip flexion 4 4  Hip extension 4 4  Hip abduction    Hip adduction    Hip internal rotation    Hip external rotation    Knee flexion 4 4  Knee extension 4 4  Ankle dorsiflexion    Ankle plantarflexion    Ankle inversion    Ankle eversion    (Blank rows = not tested)   GAIT:  Will further assess at next visit  FUNCTIONAL TESTS:  5 times sit to stand: 15 seconds SL balance: R x 20 seconds, L x 12 seconds  PATIENT SURVEYS:  FGA to be administered at second visit                                                                                                                              TREATMENT DATE: 05/20/23 Subjective: Pt reports her neurology consult went well last week.  She is scheduled for a brain/cervical spine MRI on Monday.  Had lab work done too, so far results are looking good in her mychart.  She has not had any falls at home or stumbles at home this week.  Pt reports stress contributes to some of her sx.  Pain: 0/10  Objective: 5x STS: 9 seconds (was 15 seconds at initial evaluation) LE MMT: hip flexion 5/5 b/l (was 4/5 at initial evaluation) SLS balance R x 28 seconds, L x 14 seconds (was 20 and 12 at initial evaluation)  Functional Gait Assessment (FGA) administered: to be administered as  re-test next session At initial evaluation- score 22/30 (indicates cut off for fall risk) Difficulty on stairs, amb with eyes closed, tandem walk, stepping over obstacle, amb with head turns horizontally>vertically  Modified Clinical Test of Sensory Interaction for Balance    (CTSIB): to be administered at next session  CONDITION TIME STRATEGY SWAY  Eyes open, firm surface 30 seconds ankle normal  Eyes closed, firm surface 30 seconds ankle Increased sway  Eyes open, foam surface 30 seconds ankle Increased sway  Eyes closed, foam surface 30 seconds Ankle, hip, trunk Increased sway, reaches for b/l parallel bars  Neuro re-ed:  SLS: 3x ea x 12-20 sec intervals - not today Tandem stance, both ways, 3x with 5 lb med ball overhead lift x 5 reps, 2 sets - not today Tandem walk on airex balance beam: 3 laps ea (in parallel bars, pt with intermittent hand on railing) Lateral walk on airex balance beam: 3 laps ea direction- no hand support required today Blue ladder on floor: alternating high marches forward 3 laps, lateral high knees 2 laps, diagonal each direction 2 laps ea- not today Sit to stand with 360 deg turn, 5 lb medball toss from PT, turn opposite way, pass ball back to PT then sit: 3x CW and 3X CCW, repeated 2 sets Ball toss at trampoline (feet together) on airex: forward, 5 lbs 3x30 seconds- not today Feet together on aairex: head turns R/L 30 second intervals x 3, up/down 30 second intervals x3 (PT close supervision)- not today Weave in/out of cones- turning directions, practiced keeping eyes ahead, 4 laps- not today VOR: feet together 3x 30 seconds R/L and up/down standing- HEP Eyes R/L to target x10 ea, 3 set, standing feet together Amb in hallway head turns R/L 2 laps and up/down 2 laps- not today Sit to stand and then amb around corner with surface change (concrete/grass/mulch outdoors)- practiced R and L and 180 deg pivot turn.  Practiced keeping eyes ahead to scan environment  instead of looking at feet. 3x ea Bosu: step ups- RLLR, and LRRL x15 ea  Therapeutic Exercise: Sit to stand 2x5 holding 8 lb med ball Ascend/descend stairs: using single railing, x 4 laps- not today Stepping over obstacles: 4 laps in parallel bars (multi height hurdles)- not today Squats holding 9 lbs 2x10 (chair behind her), added 3 second holds Heel raises: 2x10 Toe raises: 2x10 Front step ups on 6 inch step: LRRL, RLLR x10 ea (see above, on bosu) Standing hamstring curl: 5# 2x12 Seated knee extension: 5# 2x12- not today  Deadlifts: 2x9# dumbbells, 2x10  HEP instruction- see below  PATIENT EDUCATION: Education details: PT POC/tx for balance and strength deficits Person educated: Patient Education method: Explanation Education comprehension: verbalized understanding  HOME EXERCISE PROGRAM: Access Code: [email or web address] URL: [email or web address] Date: 03/09/2023 Prepared by: Lucrecia Sables  Exercises - Sit to Stand Without Arm Support  - 1 x daily - 7 x weekly - 4 sets - 5 reps - Tandem Walking with Counter Support  - 1 x daily - 7 x weekly - 4 sets - Single Leg Stance  - 1 x daily - 7 x weekly - 3-5 reps - 15 hold - Seated Gaze Stabilization with Head Nod  - 1 x daily - 7 x weekly - 30 reps - 30 sec hold - Seated Gaze Stabilization with Head Rotation  - 1 x daily - 7 x weekly - 3 reps - 30 sec hold   GOALS: Goals reviewed with patient? Yes  SHORT TERM GOALS: Target date: 03/28/23  Pt will demonstrate ability to perform HEP for LE strengthening and balance exercises with proper technique/form in clinic Baseline: pt is not formally performing strength exercises currently at home; 04/27/23 in progress Goal status: In progress  LONG TERM GOALS: Target date: 06/08/23  Improve LE strength 1/2 MMT grade to facilitate improved ability to ambulate down her hallway at home and descend/ascend stairs in her home without losing balance Baseline: 4/5; 5/5 hip flexion (not knee  extension or flex yet- still 4/5), ascending and descending stairs with improved balance, but hasn't been home to trial  this in a few weeks Goal status: In progress  2.  Improve FGA score to >23 indicating decreased fall risk during ambulation Baseline: to be administered at 2nd visit; will modifiy this goal accordingly if needed Goal status: In progress  3.  Improve 5x STS time to <15 seconds indicating decreased fall risk in population age >41 y/o Baseline: 15; 04/27/23: 9 seconds best trial, 11 seconds other trial Goal status: MET   ASSESSMENT:  CLINICAL IMPRESSION: Pt able to complete entire tx session today without onset of any sx that occurred at last appointment.  She demonstrates improved balance today during tandem walk on uneven surface and dynamic balance exercises on uneven surfaces or with surface changes.  She was challenged with LE strengthening progression today (increased resistance for deadlifts).  No loss of balance with deadlifts today too.  Overall, making progress with this course of outpatient PT.  Likely not yet met max benefit from skilled PT, recommend continuing tx to address strength/balance/gait impairments to improve safety during her activities indoors and outdoors at home and to reduce fall risk.  OBJECTIVE IMPAIRMENTS: Abnormal gait, decreased activity tolerance, decreased balance, decreased endurance, decreased strength, and impaired perceived functional ability.   ACTIVITY LIMITATIONS: standing, transfers, and locomotion level  PARTICIPATION LIMITATIONS: cleaning, interpersonal relationship, shopping, community activity, and yard work  PERSONAL FACTORS: Age, Time since onset of injury/illness/exacerbation, and 1-2 comorbidities: long COVID, orthopedic hx (see above)  are also affecting patient's functional outcome.   REHAB POTENTIAL: Good  CLINICAL DECISION MAKING: Evolving/moderate complexity  EVALUATION COMPLEXITY: Moderate  PLAN:  PT FREQUENCY:  2x/week  PT DURATION: 6 weeks  PLANNED INTERVENTIONS: 97110-Therapeutic exercises, 97530- Therapeutic activity, 97112- Neuromuscular re-education, 97535- Self Care, and [id number]- Manual therapy  PLAN FOR NEXT SESSION: continue LE strengthening and balance exercises- emphasizing uneven surfaces, dynamic, dual tasking activities; VOR; multi stepped or sequenced/stacked complex balance exercises  Lucrecia Sables, PT, DPT, OCS  Kin Penner, PT 05/20/2023, 9:50 AM

## 2023-05-27 ENCOUNTER — Ambulatory Visit

## 2023-05-27 DIAGNOSIS — R2689 Other abnormalities of gait and mobility: Secondary | ICD-10-CM

## 2023-05-27 NOTE — Therapy (Signed)
 OUTPATIENT PHYSICAL THERAPY NEURO TREATMENT/MEDICARE PROGRESS NOTE/RE-CERT THROUGH 06/08/23   Patient Name: Amber Richardson MRN: [id number] DOB:August 03, 1955, 68 y.o., female Today's Date: 05/27/2023  PCP: Dr. Geralyn Knee, MD  REFERRING PROVIDER: same  END OF SESSION:  PT End of Session - 05/27/23 1039     Visit Number 16    Number of Visits 22    Date for PT Re-Evaluation 06/08/23    Authorization Type 2x/week x 6 weeks (PN recert on 06/08/23) PN and recert done on 04/27/23    Authorization Time Period Medicare, PN needed at visit #20    PT Start Time 1035    PT Stop Time 1120    PT Time Calculation (min) 45 min    Equipment Utilized During Treatment Gait belt    Activity Tolerance Patient tolerated treatment well    Behavior During Therapy [facility] for tasks assessed/performed              Past Medical History:  Diagnosis Date   Allergies    Basal cell carcinoma    Fatigue 08/30/2014   Generalized anxiety disorder 03/21/2011   GERD (gastroesophageal reflux disease)    History of COVID-19 02/22/2019   HLD (hyperlipidemia)    HSV infection 09/20/2014   Hypercholesterolemia 02/08/2015   Hyperglycemia 08/28/2019   Hyperthyroidism    Major depressive disorder    Osteoporosis 05/13/2015   Pyelonephritis 1985   Scalp, arm, and leg tingling 08/28/2019   Tendinosis 09/12/2013   Rotator cuff tendinosis. Mild capsulitis.   Tremor 08/28/2019   Subtle to mild, upper extremities, left worse than right   Past Surgical History:  Procedure Laterality Date   DILATION AND CURETTAGE OF UTERUS     Miscarriage     x1   TONSILLECTOMY  1963   Patient Active Problem List   Diagnosis Date Noted   Balance problem 01/02/2023   Poison ivy dermatitis 09/26/2022   Urinary frequency 09/26/2022   Abnormal liver function tests 08/22/2022   Flank pain 08/22/2022   Cold intolerance 08/11/2022   Concussion 08/11/2022   Diarrhea 03/26/2022   Headache 01/29/2022   Melanoma in situ (HCC) 06/08/2021    Contact dermatitis and eczema due to plant 05/31/2021   Healthcare maintenance 11/11/2020   Rib pain on right side 09/30/2020   Joint pain 09/19/2020   Chest congestion 06/17/2020   Joint stiffness 06/17/2020   Change in vision 06/17/2020   Unsteady gait 06/17/2020   Moderate episode of recurrent major depressive disorder (HCC) 06/17/2020   Muscle weakness 06/13/2020   Stress 11/14/2019   Breast cancer screening 11/14/2019   Major depressive disorder    Disorder of bursae of shoulder region 09/01/2019   Knee pain 09/01/2019   Low back pain 09/01/2019   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   Tremor 08/28/2019   Hyperglycemia 08/28/2019   History of COVID-19 02/20/2019   Left arm pain 01/09/2019   Neck pain 09/04/2018   Dizziness 12/19/2017   Dyspnea 09/13/2017   Left facial numbness 06/14/2015   Osteoporosis 05/13/2015   Hypercholesterolemia 02/08/2015   Left foot pain 11/04/2014   Atrophic vaginitis 09/20/2014   HSV infection 09/20/2014   Pelvic pain in female 09/20/2014   Fatigue 08/30/2014   Chest pain 08/30/2014   Abdominal pain 08/29/2014   Tendinosis 09/12/2013   Basal cell carcinoma 03/17/2012   Hyperthyroidism 03/17/2012   Fibroids 01/19/2012   BV (bacterial vaginosis) 01/05/2012   PMB (postmenopausal bleeding) 01/05/2012   Generalized anxiety disorder 03/21/2011   GERD (gastroesophageal reflux disease) 03/21/2011  ONSET DATE: 2021  REFERRING DIAG: R26.89 (ICD-10-CM) - Balance problem   THERAPY DIAG:  Balance problem  Rationale for Evaluation and Treatment: Rehabilitation  SUBJECTIVE: From initial evaluation note 03/02/23                                                                                                                                                                                            SUBJECTIVE STATEMENT: Pt states she is having difficulty with her balance.  She has been noticing it for several months.  It is progessively worsening.  She  feels unsteady on her feet at times while walking and has to lean into the wall or reach for support.  She attributes the balance changes to her long COVID recovery process.  She is not falling to floor.  But when she stands up and starts moving example: walking towards a door and then leans on the door or leans on the wall for balance; sometimes she is weaving when she ambulates.  She notices she veers to one side more than the other.  She has a consult with [facility] neurology in March 2025 for a consult.  She had seen a neurologist earlier in the long COVID recovery process.  She has also seen her cardiologist and r/o cardiac reasons.    She has not been able to exercise a lot since COVID.  She still has sort term memory issues too.  She does walk 1.5 miles- flat neighborhood; she has not done any strength training.  If she "overdoes it" she will have a flare up for 2 days.    She is not lightheaded, no dizziness noted.  Pt accompanied by: self  PERTINENT HISTORY: Jan 2021- had COVID and long COVID sx; spent 6 months lying bed, had a tough recovery; has had exploratory R ankle arthroscopy, no other LE  or spine injuries or surgeries.  Retired Teacher, early years/pre   PAIN:  Are you having pain? No  PRECAUTIONS: Fall  RED FLAGS: None   WEIGHT BEARING RESTRICTIONS: No  FALLS: Has patient fallen in last 6 months? No, but catches herself with the wall/doorway  LIVING ENVIRONMENT: Lives with: lives with their spouse Lives in: House/apartment Stairs: yes, 3 story home- she is concerned about her balance going down the stairs- does not feel stable and leans to the R when going down the stairs; sometimes has R ankle pain while going down the stairs- has rail on the L but not on the R; has 3 steps to enter Has following equipment at home: None  PLOF: Independent  PATIENT GOALS: to improve balance  OBJECTIVE:  Note: Objective measures  were completed at Evaluation unless otherwise noted. (Portions  deferred secondary to time constraints as pt arrived late to appointment)  COGNITION: Overall cognitive status: Within functional limits for tasks assessed   SENSATION: Not tested  COORDINATION: deferred  POSTURE: rounded shoulders  LOWER EXTREMITY ROM:   functionally able to perform sit to stand independently  Active  Right Eval Left Eval  Hip flexion    Hip extension    Hip abduction    Hip adduction    Hip internal rotation    Hip external rotation    Knee flexion    Knee extension    Ankle dorsiflexion    Ankle plantarflexion    Ankle inversion    Ankle eversion     (Blank rows = not tested)  LOWER EXTREMITY MMT:    MMT Right Eval Left Eval  Hip flexion 4 4  Hip extension 4 4  Hip abduction    Hip adduction    Hip internal rotation    Hip external rotation    Knee flexion 4 4  Knee extension 4 4  Ankle dorsiflexion    Ankle plantarflexion    Ankle inversion    Ankle eversion    (Blank rows = not tested)   GAIT:  Will further assess at next visit  FUNCTIONAL TESTS:  5 times sit to stand: 15 seconds SL balance: R x 20 seconds, L x 12 seconds  PATIENT SURVEYS:  FGA to be administered at second visit                                                                                                                              TREATMENT DATE: 05/27/23 Subjective: Pt had her head and neck MRI done; has f/u ~06/10/23 with neurology to discuss results.  No falls since last session.    Pain: 0/10  Objective: 5x STS: 9 seconds (was 15 seconds at initial evaluation) LE MMT: hip flexion 5/5 b/l (was 4/5 at initial evaluation) SLS balance R x 28 seconds, L x 14 seconds (was 20 and 12 at initial evaluation)  Functional Gait Assessment (FGA) administered: to be administered as re-test next session At initial evaluation- score 22/30 (indicates cut off for fall risk) Difficulty on stairs, amb with eyes closed, tandem walk, stepping over obstacle, amb with head  turns horizontally>vertically  Modified Clinical Test of Sensory Interaction for Balance    (CTSIB): to be administered at next session  CONDITION TIME STRATEGY SWAY  Eyes open, firm surface 30 seconds ankle normal  Eyes closed, firm surface 30 seconds ankle Increased sway  Eyes open, foam surface 30 seconds ankle Increased sway  Eyes closed, foam surface 30 seconds Ankle, hip, trunk Increased sway, reaches for b/l parallel bars    Neuro re-ed:  SLS: 3x ea x 12-20 sec intervals - not today Tandem stance, both ways, 3x with 5 lb med ball overhead lift x 5 reps, 2 sets - not today Tandem  walk on airex balance beam: 3 laps ea (in parallel bars, pt with intermittent hand on railing) Lateral walk on airex balance beam: 3 laps ea direction- no hand support required today Blue ladder on floor: alternating high marches forward 3 laps, lateral high knees 2 laps, diagonal each direction 2 laps ea- not today Sit to stand with 360 deg turn, 5 lb medball toss from PT, turn opposite way, pass ball back to PT then sit: 3x CW and 3X CCW, repeated 2 sets Ball toss at trampoline (feet together) on airex: forward, 5 lbs 3x30 seconds- not today Feet together on aairex: head turns R/L 30 second intervals x 3, up/down 30 second intervals x3 (PT close supervision)- not today Weave in/out of cones- turning directions, practiced keeping eyes ahead, 4 laps- not today VOR: feet together 3x 30 seconds R/L and up/down standing- HEP Eyes R/L to target x10 ea, 3 set, standing feet together Amb in hallway head turns R/L 2 laps and up/down 2 laps- not today Sit to stand and then amb around corner with surface change (concrete/grass/mulch outdoors)- practiced R and L and 180 deg pivot turn.  Practiced keeping eyes ahead to scan environment instead of looking at feet. 3x ea- not today Bosu: step ups- RLLR, and LRRL x15 ea Bosu mini squats: 2x10 in parallel bars (standing flat surface) Amb on grass outdoor surfaces:  forward/reverse intervals 2x 25 ft with PT verbal cues for sudden direction changes; head turns R/L 2 laps, look up/down 2 laps  Therapeutic Exercise: Sit to stand 2x10 holding 10 lb med ball- not today Ascend/descend stairs: using single railing, x 4 laps- not today Stepping over obstacles: 4 laps in parallel bars (multi height hurdles)- not today Squats holding 10 lb med ball: 2x10 Heel raises: 2x10 5# ankle weights Toe raises: 2x10 5# ankle weights Front step ups on 6 inch step: LRRL, RLLR x10 ea- not today Standing hamstring curl: 5# 2x12 Seated knee extension: 5# 2x12 Deadlifts: 2x9# dumbbells, 2x10  HEP instruction- see below  PATIENT EDUCATION: Education details: PT POC/tx for balance and strength deficits Person educated: Patient Education method: Explanation Education comprehension: verbalized understanding  HOME EXERCISE PROGRAM: Access Code: [email or web address] URL: [email or web address] Date: 03/09/2023 Prepared by: Lucrecia Sables  Exercises - Sit to Stand Without Arm Support  - 1 x daily - 7 x weekly - 4 sets - 5 reps - Tandem Walking with Counter Support  - 1 x daily - 7 x weekly - 4 sets - Single Leg Stance  - 1 x daily - 7 x weekly - 3-5 reps - 15 hold - Seated Gaze Stabilization with Head Nod  - 1 x daily - 7 x weekly - 30 reps - 30 sec hold - Seated Gaze Stabilization with Head Rotation  - 1 x daily - 7 x weekly - 3 reps - 30 sec hold   GOALS: Goals reviewed with patient? Yes  SHORT TERM GOALS: Target date: 03/28/23  Pt will demonstrate ability to perform HEP for LE strengthening and balance exercises with proper technique/form in clinic Baseline: pt is not formally performing strength exercises currently at home; 04/27/23 in progress Goal status: In progress  LONG TERM GOALS: Target date: 06/08/23  Improve LE strength 1/2 MMT grade to facilitate improved ability to ambulate down her hallway at home and descend/ascend stairs in her home without losing  balance Baseline: 4/5; 5/5 hip flexion (not knee extension or flex yet- still 4/5), ascending and descending stairs with improved balance, but hasn't been  home to trial this in a few weeks Goal status: In progress  2.  Improve FGA score to >23 indicating decreased fall risk during ambulation Baseline: to be administered at 2nd visit; will modifiy this goal accordingly if needed Goal status: In progress  3.  Improve 5x STS time to <15 seconds indicating decreased fall risk in population age >7 y/o Baseline: 15; 04/27/23: 9 seconds best trial, 11 seconds other trial Goal status: MET   ASSESSMENT:  CLINICAL IMPRESSION: Pt continues to have difficulty with head turns R and L while amb on uneven grass surface; her gait deviates R and L and foot crosses midline intermittently to maintain balance.  Tolerated LE strengthening well; demonstrates good trunk control, hip control, and ankle control on bosu today.  Overall, making progress with this course of outpatient PT.  Likely not yet met max benefit from skilled PT, recommend continuing tx to address strength/balance/gait impairments to improve safety during her activities indoors and outdoors at home and to reduce fall risk.  OBJECTIVE IMPAIRMENTS: Abnormal gait, decreased activity tolerance, decreased balance, decreased endurance, decreased strength, and impaired perceived functional ability.   ACTIVITY LIMITATIONS: standing, transfers, and locomotion level  PARTICIPATION LIMITATIONS: cleaning, interpersonal relationship, shopping, community activity, and yard work  PERSONAL FACTORS: Age, Time since onset of injury/illness/exacerbation, and 1-2 comorbidities: long COVID, orthopedic hx (see above)  are also affecting patient's functional outcome.   REHAB POTENTIAL: Good  CLINICAL DECISION MAKING: Evolving/moderate complexity  EVALUATION COMPLEXITY: Moderate  PLAN:  PT FREQUENCY: 2x/week  PT DURATION: 6 weeks  PLANNED INTERVENTIONS:  97110-Therapeutic exercises, 97530- Therapeutic activity, 97112- Neuromuscular re-education, 97535- Self Care, and [id number]- Manual therapy  PLAN FOR NEXT SESSION: continue LE strengthening and balance exercises- emphasizing uneven surfaces, dynamic, dual tasking activities; VOR; multi stepped or sequenced/stacked complex balance exercises  Lucrecia Sables, PT, DPT, OCS  Kin Penner, PT 05/27/2023, 10:39 AM

## 2023-06-01 ENCOUNTER — Ambulatory Visit: Attending: Internal Medicine

## 2023-06-01 DIAGNOSIS — R2689 Other abnormalities of gait and mobility: Secondary | ICD-10-CM | POA: Diagnosis present

## 2023-06-01 NOTE — Therapy (Signed)
 OUTPATIENT PHYSICAL THERAPY NEURO TREATMENT/MEDICARE PROGRESS NOTE/RE-CERT THROUGH 06/08/23   Patient Name: Amber Richardson MRN: [id number] DOB:10/14/1955, 68 y.o., female Today's Date: 06/01/2023  PCP: Dr. Geralyn Knee, MD  REFERRING PROVIDER: same  END OF SESSION:  PT End of Session - 06/01/23 1802     Visit Number 17    Number of Visits 22    Date for PT Re-Evaluation 06/08/23    Authorization Type 2x/week x 6 weeks (PN recert on 06/08/23) PN and recert done on 04/27/23    Authorization Time Period Medicare, PN needed at visit #20    PT Start Time 1650    PT Stop Time 1730    PT Time Calculation (min) 40 min    Equipment Utilized During Treatment Gait belt    Activity Tolerance Patient tolerated treatment well    Behavior During Therapy [facility] for tasks assessed/performed              Past Medical History:  Diagnosis Date   Allergies    Basal cell carcinoma    Fatigue 08/30/2014   Generalized anxiety disorder 03/21/2011   GERD (gastroesophageal reflux disease)    History of COVID-19 02/22/2019   HLD (hyperlipidemia)    HSV infection 09/20/2014   Hypercholesterolemia 02/08/2015   Hyperglycemia 08/28/2019   Hyperthyroidism    Major depressive disorder    Osteoporosis 05/13/2015   Pyelonephritis 1985   Scalp, arm, and leg tingling 08/28/2019   Tendinosis 09/12/2013   Rotator cuff tendinosis. Mild capsulitis.   Tremor 08/28/2019   Subtle to mild, upper extremities, left worse than right   Past Surgical History:  Procedure Laterality Date   DILATION AND CURETTAGE OF UTERUS     Miscarriage     x1   TONSILLECTOMY  1963   Patient Active Problem List   Diagnosis Date Noted   Balance problem 01/02/2023   Poison ivy dermatitis 09/26/2022   Urinary frequency 09/26/2022   Abnormal liver function tests 08/22/2022   Flank pain 08/22/2022   Cold intolerance 08/11/2022   Concussion 08/11/2022   Diarrhea 03/26/2022   Headache 01/29/2022   Melanoma in situ (HCC) 06/08/2021    Contact dermatitis and eczema due to plant 05/31/2021   Healthcare maintenance 11/11/2020   Rib pain on right side 09/30/2020   Joint pain 09/19/2020   Chest congestion 06/17/2020   Joint stiffness 06/17/2020   Change in vision 06/17/2020   Unsteady gait 06/17/2020   Moderate episode of recurrent major depressive disorder (HCC) 06/17/2020   Muscle weakness 06/13/2020   Stress 11/14/2019   Breast cancer screening 11/14/2019   Major depressive disorder    Disorder of bursae of shoulder region 09/01/2019   Knee pain 09/01/2019   Low back pain 09/01/2019   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   Tremor 08/28/2019   Hyperglycemia 08/28/2019   History of COVID-19 02/20/2019   Left arm pain 01/09/2019   Neck pain 09/04/2018   Dizziness 12/19/2017   Dyspnea 09/13/2017   Left facial numbness 06/14/2015   Osteoporosis 05/13/2015   Hypercholesterolemia 02/08/2015   Left foot pain 11/04/2014   Atrophic vaginitis 09/20/2014   HSV infection 09/20/2014   Pelvic pain in female 09/20/2014   Fatigue 08/30/2014   Chest pain 08/30/2014   Abdominal pain 08/29/2014   Tendinosis 09/12/2013   Basal cell carcinoma 03/17/2012   Hyperthyroidism 03/17/2012   Fibroids 01/19/2012   BV (bacterial vaginosis) 01/05/2012   PMB (postmenopausal bleeding) 01/05/2012   Generalized anxiety disorder 03/21/2011   GERD (gastroesophageal reflux disease) 03/21/2011  ONSET DATE: 2021  REFERRING DIAG: R26.89 (ICD-10-CM) - Balance problem   THERAPY DIAG:  Balance problem  Rationale for Evaluation and Treatment: Rehabilitation  SUBJECTIVE: From initial evaluation note 03/02/23                                                                                                                                                                                            SUBJECTIVE STATEMENT: Pt states she is having difficulty with her balance.  She has been noticing it for several months.  It is progessively worsening.  She  feels unsteady on her feet at times while walking and has to lean into the wall or reach for support.  She attributes the balance changes to her long COVID recovery process.  She is not falling to floor.  But when she stands up and starts moving example: walking towards a door and then leans on the door or leans on the wall for balance; sometimes she is weaving when she ambulates.  She notices she veers to one side more than the other.  She has a consult with [facility] neurology in March 2025 for a consult.  She had seen a neurologist earlier in the long COVID recovery process.  She has also seen her cardiologist and r/o cardiac reasons.    She has not been able to exercise a lot since COVID.  She still has sort term memory issues too.  She does walk 1.5 miles- flat neighborhood; she has not done any strength training.  If she "overdoes it" she will have a flare up for 2 days.    She is not lightheaded, no dizziness noted.  Pt accompanied by: self  PERTINENT HISTORY: Jan 2021- had COVID and long COVID sx; spent 6 months lying bed, had a tough recovery; has had exploratory R ankle arthroscopy, no other LE  or spine injuries or surgeries.  Retired Teacher, early years/pre   PAIN:  Are you having pain? No  PRECAUTIONS: Fall  RED FLAGS: None   WEIGHT BEARING RESTRICTIONS: No  FALLS: Has patient fallen in last 6 months? No, but catches herself with the wall/doorway  LIVING ENVIRONMENT: Lives with: lives with their spouse Lives in: House/apartment Stairs: yes, 3 story home- she is concerned about her balance going down the stairs- does not feel stable and leans to the R when going down the stairs; sometimes has R ankle pain while going down the stairs- has rail on the L but not on the R; has 3 steps to enter Has following equipment at home: None  PLOF: Independent  PATIENT GOALS: to improve balance  OBJECTIVE:  Note: Objective measures  were completed at Evaluation unless otherwise noted. (Portions  deferred secondary to time constraints as pt arrived late to appointment)  COGNITION: Overall cognitive status: Within functional limits for tasks assessed   SENSATION: Not tested  COORDINATION: deferred  POSTURE: rounded shoulders  LOWER EXTREMITY ROM:   functionally able to perform sit to stand independently  Active  Right Eval Left Eval  Hip flexion    Hip extension    Hip abduction    Hip adduction    Hip internal rotation    Hip external rotation    Knee flexion    Knee extension    Ankle dorsiflexion    Ankle plantarflexion    Ankle inversion    Ankle eversion     (Blank rows = not tested)  LOWER EXTREMITY MMT:    MMT Right Eval Left Eval  Hip flexion 4 4  Hip extension 4 4  Hip abduction    Hip adduction    Hip internal rotation    Hip external rotation    Knee flexion 4 4  Knee extension 4 4  Ankle dorsiflexion    Ankle plantarflexion    Ankle inversion    Ankle eversion    (Blank rows = not tested)   GAIT:  Will further assess at next visit  FUNCTIONAL TESTS:  5 times sit to stand: 15 seconds SL balance: R x 20 seconds, L x 12 seconds  PATIENT SURVEYS:  FGA to be administered at second visit                                                                                                                              TREATMENT DATE: 05/27/23 Subjective: Pt had her head and neck MRI done; has f/u ~06/10/23 with neurology to discuss results.   Still dealing with a lot of personal life stressors currently; makes working on HEP challenging. Trying to do a few of the exercises.  Doing a little walking.  Had 1 episode of loss of balance while beginning to walk while returning to standing from bending over to make the bed; leaned heavily into doorway.  Pain: 0/10  Objective: 5x STS: 9 seconds (was 15 seconds at initial evaluation) LE MMT: hip flexion 5/5 b/l (was 4/5 at initial evaluation) SLS balance R x 28 seconds, L x 14 seconds (was 20 and 12 at  initial evaluation)  Functional Gait Assessment (FGA) administered: to be administered as re-test next session At initial evaluation- score 22/30 (indicates cut off for fall risk) Difficulty on stairs, amb with eyes closed, tandem walk, stepping over obstacle, amb with head turns horizontally>vertically  Modified Clinical Test of Sensory Interaction for Balance    (CTSIB): to be administered at next session  CONDITION TIME STRATEGY SWAY  Eyes open, firm surface 30 seconds ankle normal  Eyes closed, firm surface 30 seconds ankle Increased sway  Eyes open, foam surface 30 seconds ankle Increased sway  Eyes closed, foam surface  30 seconds Ankle, hip, trunk Increased sway, reaches for b/l parallel bars    Neuro re-ed:  SLS: 3x ea LE focusing on keeping eyes on target in front, 20-30 sec intervals in parallel bars Tandem stance, both ways, 3x with 5 lb med ball overhead lift x 5 reps, 2 sets - not today Tandem walk on grass: 3 laps x 20 ft with PT (gait belt and CGA)- significant trunk/hip lateral movement noted, pt uses stepping strategy to regain balance Grape vine stepping R/L in grass: 3 laps x 20 ft with PT (gait belt and supervision) no loss of balance  Blue ladder on floor: alternating high marches forward 3 laps, lateral high knees 2 laps, diagonal each direction 2 laps ea- not today Sit to stand with 360 deg turn, 5 lb medball toss from PT, turn opposite way, pass ball back to PT then sit: 3x CW and 3X CCW, repeated 2 sets Ball toss at trampoline (feet together) on airex: forward, 5 lbs 3x30 seconds- not today Feet together on airex: head turns R/L 30 second intervals x 3, up/down 30 second intervals x3 (PT close supervision)- not today Weave in/out of cones- turning directions, practiced keeping eyes ahead, 4 laps- not today VOR: feet together 3x 30 seconds R/L and up/down standing- HEP Amb in hallway head turns R/L 2 laps and up/down 2 laps- not today Sit to stand and then amb  around corner with surface change (concrete/grass/mulch outdoors)- practiced R and L and 180 deg pivot turn.  Practiced keeping eyes ahead to scan environment instead of looking at feet. 3x ea- not today Bosu: step ups- RLLR, and LRRL x15 ea Bosu mini squats: 2x10 in parallel bars (standing flat surface)- not today Amb on grass outdoor surfaces: forward/reverse intervals 3x 25 ft with PT verbal cues for sudden direction changes; then head turns R/L 2 laps, look up/down 2 laps (gait belt, PT supervision)   MMT: hip abd R 4-/5, L 4/5 today SLS: L 18 seconds, R 25 seconds  Therapeutic Exercise: Sit to stand 2x12 holding 10 lb med ball- done as squats to hips tap table and hover hold for endurance Lateral band walk with blue TB at distal thighs, 4 laps x 20 ft Ascend/descend stairs: using single railing, x 4 laps- not today Stepping over obstacles: 4 laps in parallel bars (multi height hurdles)- not today Heel raises: 2x10 5# ankle weights- not today Toe raises: 2x10 5# ankle weights- not today Front step ups on 6 inch step: LRRL, RLLR x10 ea- not today Standing hamstring curl: 5# 2x12 Seated knee extension: 5# 2x12 Deadlifts: 2x9# dumbbells, 2x10- not today  HEP instruction- see below  PATIENT EDUCATION: Education details: PT POC/tx for balance and strength deficits Person educated: Patient Education method: Explanation Education comprehension: verbalized understanding  HOME EXERCISE PROGRAM: Access Code: [email or web address] URL: [email or web address] Date: 03/09/2023 Prepared by: Lucrecia Sables  Exercises - Sit to Stand Without Arm Support  - 1 x daily - 7 x weekly - 4 sets - 5 reps - Tandem Walking with Counter Support  - 1 x daily - 7 x weekly - 4 sets - Single Leg Stance  - 1 x daily - 7 x weekly - 3-5 reps - 15 hold - Seated Gaze Stabilization with Head Nod  - 1 x daily - 7 x weekly - 30 reps - 30 sec hold - Seated Gaze Stabilization with Head Rotation  - 1 x daily - 7 x  weekly - 3 reps - 30 sec hold  GOALS: Goals reviewed with patient? Yes  SHORT TERM GOALS: Target date: 03/28/23  Pt will demonstrate ability to perform HEP for LE strengthening and balance exercises with proper technique/form in clinic Baseline: pt is not formally performing strength exercises currently at home; 04/27/23 in progress Goal status: In progress  LONG TERM GOALS: Target date: 06/08/23  Improve LE strength 1/2 MMT grade to facilitate improved ability to ambulate down her hallway at home and descend/ascend stairs in her home without losing balance Baseline: 4/5; 5/5 hip flexion (not knee extension or flex yet- still 4/5), ascending and descending stairs with improved balance, but hasn't been home to trial this in a few weeks Goal status: In progress  2.  Improve FGA score to >23 indicating decreased fall risk during ambulation Baseline: to be administered at 2nd visit; will modifiy this goal accordingly if needed Goal status: In progress  3.  Improve 5x STS time to <15 seconds indicating decreased fall risk in population age >19 y/o Baseline: 15; 04/27/23: 9 seconds best trial, 11 seconds other trial Goal status: MET   ASSESSMENT:  CLINICAL IMPRESSION: Pt continues to have difficulty with head turns R and L while amb on uneven grass surface; her gait deviates R and L and foot crosses midline intermittently to maintain balance.  Tolerated LE strengthening well; progressed hip mm strengthening to address abd weakness noted with MMT today.  SLS balance times improving since starting PT too.  Overall, making progress with this course of outpatient PT.  Likely not yet met max benefit from skilled PT, recommend continuing tx to address strength/balance/gait impairments to improve safety during her activities indoors and outdoors at home and to reduce fall risk.  OBJECTIVE IMPAIRMENTS: Abnormal gait, decreased activity tolerance, decreased balance, decreased endurance, decreased  strength, and impaired perceived functional ability.   ACTIVITY LIMITATIONS: standing, transfers, and locomotion level  PARTICIPATION LIMITATIONS: cleaning, interpersonal relationship, shopping, community activity, and yard work  PERSONAL FACTORS: Age, Time since onset of injury/illness/exacerbation, and 1-2 comorbidities: long COVID, orthopedic hx (see above)  are also affecting patient's functional outcome.   REHAB POTENTIAL: Good  CLINICAL DECISION MAKING: Evolving/moderate complexity  EVALUATION COMPLEXITY: Moderate  PLAN:  PT FREQUENCY: 2x/week  PT DURATION: 6 weeks  PLANNED INTERVENTIONS: 97110-Therapeutic exercises, 97530- Therapeutic activity, 97112- Neuromuscular re-education, 97535- Self Care, and 16109- Manual therapy  PLAN FOR NEXT SESSION: continue LE strengthening and balance exercises- emphasizing uneven surfaces, dynamic, dual tasking activities; VOR; multi stepped or sequenced/stacked complex balance exercises  Lucrecia Sables, PT, DPT, OCS  Kin Penner, PT 06/01/2023, 6:03 PM

## 2023-06-02 ENCOUNTER — Ambulatory Visit: Payer: Medicare HMO | Admitting: Internal Medicine

## 2023-06-02 ENCOUNTER — Encounter: Payer: Self-pay | Admitting: Internal Medicine

## 2023-06-02 ENCOUNTER — Other Ambulatory Visit: Payer: Self-pay

## 2023-06-02 ENCOUNTER — Ambulatory Visit
Admission: RE | Admit: 2023-06-02 | Discharge: 2023-06-02 | Disposition: A | Discharge status: Home / Self Care | Source: Ambulatory Visit | Attending: Internal Medicine | Admitting: Internal Medicine

## 2023-06-02 VITALS — BP 110/68 | HR 64 | Temp 98.0°F | Resp 16 | Ht 67.0 in | Wt 165.0 lb

## 2023-06-02 DIAGNOSIS — E0590 Thyrotoxicosis, unspecified without thyrotoxic crisis or storm: Secondary | ICD-10-CM

## 2023-06-02 DIAGNOSIS — R2681 Unsteadiness on feet: Secondary | ICD-10-CM

## 2023-06-02 DIAGNOSIS — E7800 Pure hypercholesterolemia, unspecified: Secondary | ICD-10-CM | POA: Diagnosis not present

## 2023-06-02 DIAGNOSIS — R739 Hyperglycemia, unspecified: Secondary | ICD-10-CM | POA: Diagnosis not present

## 2023-06-02 DIAGNOSIS — S0990XA Unspecified injury of head, initial encounter: Secondary | ICD-10-CM | POA: Diagnosis present

## 2023-06-02 DIAGNOSIS — F331 Major depressive disorder, recurrent, moderate: Secondary | ICD-10-CM

## 2023-06-02 DIAGNOSIS — K219 Gastro-esophageal reflux disease without esophagitis: Secondary | ICD-10-CM

## 2023-06-02 DIAGNOSIS — Z0000 Encounter for general adult medical examination without abnormal findings: Secondary | ICD-10-CM

## 2023-06-02 DIAGNOSIS — R2689 Other abnormalities of gait and mobility: Secondary | ICD-10-CM | POA: Diagnosis not present

## 2023-06-02 LAB — CBC WITH DIFFERENTIAL/PLATELET
Basophils Absolute: 0 10*3/uL (ref 0.0–0.1)
Basophils Relative: 0.6 % (ref 0.0–3.0)
Eosinophils Absolute: 0.1 10*3/uL (ref 0.0–0.7)
Eosinophils Relative: 2.2 % (ref 0.0–5.0)
HCT: 39.9 % (ref 36.0–46.0)
Hemoglobin: 13 g/dL (ref 12.0–15.0)
Lymphocytes Relative: 37.4 % (ref 12.0–46.0)
Lymphs Abs: 1.8 10*3/uL (ref 0.7–4.0)
MCHC: 32.6 g/dL (ref 30.0–36.0)
MCV: 99.7 fl (ref 78.0–100.0)
Monocytes Absolute: 0.5 10*3/uL (ref 0.1–1.0)
Monocytes Relative: 10 % (ref 3.0–12.0)
Neutro Abs: 2.4 10*3/uL (ref 1.4–7.7)
Neutrophils Relative %: 49.8 % (ref 43.0–77.0)
Platelets: 260 10*3/uL (ref 150.0–400.0)
RBC: 4.01 Mil/uL (ref 3.87–5.11)
RDW: 12.8 % (ref 11.5–15.5)
WBC: 4.8 10*3/uL (ref 4.0–10.5)

## 2023-06-02 LAB — BASIC METABOLIC PANEL WITH GFR
BUN: 22 mg/dL (ref 6–23)
CO2: 29 meq/L (ref 19–32)
Calcium: 9.1 mg/dL (ref 8.4–10.5)
Chloride: 103 meq/L (ref 96–112)
Creatinine, Ser: 0.84 mg/dL (ref 0.40–1.20)
GFR: 72.95 mL/min (ref >60.00)
Glucose, Bld: 91 mg/dL (ref 70–99)
Potassium: 4.3 meq/L (ref 3.5–5.1)
Sodium: 139 meq/L (ref 135–145)

## 2023-06-02 LAB — LIPID PANEL
Cholesterol: 219 mg/dL — ABNORMAL HIGH (ref 0–200)
HDL: 65.7 mg/dL (ref >39.00)
LDL Cholesterol: 123 mg/dL — ABNORMAL HIGH (ref 0–99)
NonHDL: 153.56
Total CHOL/HDL Ratio: 3
Triglycerides: 154 mg/dL — ABNORMAL HIGH (ref 0.0–149.0)
VLDL: 30.8 mg/dL (ref 0.0–40.0)

## 2023-06-02 LAB — HEPATIC FUNCTION PANEL
ALT: 21 U/L (ref 0–35)
AST: 20 U/L (ref 0–37)
Albumin: 4.5 g/dL (ref 3.5–5.2)
Alkaline Phosphatase: 119 U/L — ABNORMAL HIGH (ref 39–117)
Bilirubin, Direct: 0.1 mg/dL (ref 0.0–0.3)
Total Bilirubin: 0.8 mg/dL (ref 0.2–1.2)
Total Protein: 6.5 g/dL (ref 6.0–8.3)

## 2023-06-02 LAB — TSH: TSH: 0.25 u[IU]/mL — ABNORMAL LOW (ref 0.35–5.50)

## 2023-06-02 LAB — HEMOGLOBIN A1C: Hgb A1c MFr Bld: 5.7 % (ref 4.6–6.5)

## 2023-06-02 MED ORDER — LEVOTHYROXINE SODIUM 75 MCG PO TABS: 75.0000 ug | ORAL_TABLET | Freq: Every day | ORAL | 1 refills | Status: DC

## 2023-06-02 NOTE — Assessment & Plan Note (Addendum)
 Physical not performed today. Appt changed to follow up. PAP 03/15/20 - negative. satisfactory for evaluation.  Endocervical component is absent. HPV negative. Colonoscopy 12/22/22 - diverticulosis, non bleeding internal hemorrhoids.  Recommended f/u colonoscopy in 10 years.  Mammogram 03/27/22 - Birads I. She is planning to schedule.

## 2023-06-02 NOTE — Assessment & Plan Note (Signed)
 Followed by psychiatry.  Continues on cymbalta. Follow. Increased stress recently as outlined. Information given for counseling information.

## 2023-06-02 NOTE — Assessment & Plan Note (Signed)
 Persistent balance issues as outlined.   Saw neurology 05/13/23 - for evaluation of unsteady gait. Recommended MRI brain, MRI cervical spine and EMG/NCS. Recommended continuing PT and consideration of ENT evaluation or vestibular rehab if no neurological causes identified. MRI brain 05/25/23 - normal. MRI c-spine - Multilevel degenerative changes without significant spinal canal stenosis.  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 Planning for NCS. Continue PT.

## 2023-06-02 NOTE — Assessment & Plan Note (Signed)
 Low cholesterol diet and exercise as tolerated.  Off crestor  due to intolerance.  On repatha . Check lipid panel today.

## 2023-06-02 NOTE — Assessment & Plan Note (Signed)
 Had fall as outlined. (Yesterday evening). Did hit her head as outlined. Increased soft tissue swelling and abrasion - left cheek. Tender over this area. Described a hard hit - on concrete. Will obtain CT head to rule out bleed, facial bone injury. Follow symptoms.

## 2023-06-02 NOTE — Assessment & Plan Note (Addendum)
 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  (Gastritis noted on EGD).  No upper symptoms reported.

## 2023-06-02 NOTE — Assessment & Plan Note (Signed)
Off tapazole.  On synthroid.  Follow tsh.  °

## 2023-06-02 NOTE — Progress Notes (Signed)
 Subjective:    Patient ID: Amber Richardson, female    DOB: April 04, 1955, 69 y.o.   MRN: [id number]  Patient here for  Chief Complaint  Patient presents with   Medical Management of Chronic Issues    HPI Here for a physical exam. Was scheduled for physical. Appt changed to follow up appt. Saw neurology 05/13/23 - for evaluation of unsteady gait. Recommended MRI brain, MRI cervical spine and EMG/NCS. Recommended continuing PT and consideration of ENT evaluation or vestibular rehab if no neurological causes identified. MRI brain 05/25/23 - normal. MRI c-spine - Multilevel degenerative changes without significant spinal canal stenosis.  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 Was also evaluated by ortho 04/23/23 - subacromial impingement (left) - recommended PT. Saw GI 11/07/22 - recommended EGD and colonoscopy. Colonoscopy 12/22/22 - non bleeding internal hemorrhoids and diverticulosis. EGD - gastritis - s/p dilation. Recommended f/u colonoscopy in 10 years. Has been going to PT. Coming out of PT yesterday, she fell. She was walking down a concrete sidewalk. Foot may have when off the curb. Her foot turned and she fell.  Her arms were wrapped in her chest. She fell on her left wrist/arm and hit the left side of her head/cheek. Has some increased soft tissue swelling and abrasion - left cheek and tenderness left cheek. No headache ache. No chest pain or sob reported. No hip pain from the fall. Scraped her knee. No pain in her knee. Increased stress with her mother's recent passing and now is her brother's caretaker. Discussed family counseling.    Past Medical History:  Diagnosis Date   Allergies    Basal cell carcinoma    Fatigue 08/30/2014   Generalized anxiety disorder 03/21/2011   GERD (gastroesophageal reflux disease)    History of COVID-19 02/22/2019   HLD (hyperlipidemia)    HSV infection 09/20/2014    Hypercholesterolemia 02/08/2015   Hyperglycemia 08/28/2019   Hyperthyroidism    Major depressive disorder    Osteoporosis 05/13/2015   Pyelonephritis 1985   Scalp, arm, and leg tingling 08/28/2019   Tendinosis 09/12/2013   Rotator cuff tendinosis. Mild capsulitis.   Tremor 08/28/2019   Subtle to mild, upper extremities, left worse than right   Past Surgical History:  Procedure Laterality Date   DILATION AND CURETTAGE OF UTERUS     Miscarriage     x1   TONSILLECTOMY  1963   Family History  Problem Relation Age of Onset   Hypertension Mother    Hypercholesterolemia Mother    Heart disease Father    Hyperlipidemia Father    Hypercholesterolemia Father    Stroke Father    Dementia Father        Vascular dementia related to stroke history   Social History   Socioeconomic History   Marital status: Married    Spouse name: Not on file   Number of children: 0   Years of education: 16   Highest education level: Bachelor's degree (e.g., BA, AB, BS)  Occupational History   Not on file  Tobacco Use   Smoking status: Never   Smokeless tobacco: Never  Vaping Use   Vaping status: Never Used  Substance and Sexual Activity   Alcohol use: No    Alcohol/week: 0.0 standard drinks of alcohol   Drug use: No   Sexual activity: Not on file  Other Topics Concern   Not on file  Social History Narrative   Right handed  Drinks one cup coffee am and one pepsi   Social Drivers of Health   Financial Resource Strain: Low Risk  (04/20/2023)   Received [facility]   Overall Financial Resource Strain (CARDIA)    Difficulty of Paying Living Expenses: Not hard at all  Food Insecurity: No Food Insecurity (04/20/2023)   Received from [facility]   Hunger Vital Sign    Worried About Running Out of Food in the Last Year: Never true    Ran Out of Food in the Last Year: Never true  Transportation Needs: No Transportation Needs (04/20/2023)   Received  from [facility] - Transportation    In the past 12 months, has lack of transportation kept you from medical appointments or from getting medications?: No    Lack of Transportation (Non-Medical): No  Physical Activity: Sufficiently Active (09/25/2022)   Exercise Vital Sign    Days of Exercise per Week: 5 days    Minutes of Exercise per Session: 40 min  Stress: No Stress Concern Present (09/25/2022)   [employer] of Occupational Health - Occupational Stress Questionnaire    Feeling of Stress : Not at all  Social Connections: Unknown (09/25/2022)   Social Connection and Isolation Panel [NHANES]    Frequency of Communication with Friends and Family: More than three times a week    Frequency of Social Gatherings with Friends and Family: Once a week    Attends Religious Services: Patient declined    Database administrator or Organizations: No    Attends Engineer, structural: Not on file    Marital Status: Married     Review of Systems  Constitutional:  Negative for appetite change and unexpected weight change.  HENT:  Negative for congestion and sinus pressure.   Respiratory:  Negative for cough, chest tightness and shortness of breath.   Cardiovascular:  Negative for chest pain, palpitations and leg swelling.  Gastrointestinal:  Negative for abdominal pain, nausea and vomiting.  Genitourinary:  Negative for difficulty urinating and dysuria.  Musculoskeletal:  Positive for back pain and gait problem.  Skin:  Negative for color change and rash.  Neurological:        Unsteady gait.   Psychiatric/Behavioral:  Negative for agitation.        Increased stress.        Objective:     BP 110/68   Pulse 64   Temp 98 F (36.7 C)   Resp 16   Ht 5\' 7"  (1.702 m)   Wt 165 lb (74.8 kg)   LMP 01/13/2008   SpO2 98%   BMI 25.84 kg/m  Wt Readings from Last 3 Encounters:  06/02/23 165 lb (74.8 kg)  01/02/23 166 lb (75.3 kg)  09/26/22 165 lb 3.2 oz  (74.9 kg)    Physical Exam Vitals reviewed.  Constitutional:      General: She is not in acute distress.    Appearance: Normal appearance.  HENT:     Head: Normocephalic.     Comments: Increased soft tissue swelling - over cheek bone. Increased tenderness to palpation over cheek bone.     Right Ear: External ear normal.     Left Ear: External ear normal.     Mouth/Throat:     Pharynx: No oropharyngeal exudate or posterior oropharyngeal erythema.  Eyes:     General: No scleral icterus.       Right eye: No discharge.  Left eye: No discharge.     Conjunctiva/sclera: Conjunctivae normal.  Neck:     Thyroid : No thyromegaly.  Cardiovascular:     Rate and Rhythm: Normal rate and regular rhythm.  Pulmonary:     Effort: No respiratory distress.     Breath sounds: Normal breath sounds. No wheezing.  Abdominal:     General: Bowel sounds are normal.     Palpations: Abdomen is soft.     Tenderness: There is no abdominal tenderness.  Musculoskeletal:        General: No swelling.     Cervical back: Neck supple. No tenderness.     Comments: Increased rom hip. No pain with adduction/abduction - lower extremities. Good rom shoulder. No pain to palpation of the wrist (bone). Some increased tenderness - soft tissue - forearm.   Lymphadenopathy:     Cervical: No cervical adenopathy.  Skin:    Findings: No erythema.     Comments: Small lesion left lateral neck and right anterior chest.  Neurological:     Mental Status: She is alert.  Psychiatric:        Mood and Affect: Mood normal.        Behavior: Behavior normal.         Outpatient Encounter Medications as of 06/02/2023  Medication Sig   albuterol  (VENTOLIN  HFA) 108 (90 Base) MCG/ACT inhaler TAKE 2 PUFFS BY MOUTH EVERY 6 HOURS AS NEEDED FOR WHEEZE OR SHORTNESS OF BREATH   ALPRAZolam (XANAX) 0.5 MG tablet Take by mouth.   Calcium  Citrate-Vitamin D  (CITRACAL + D PO) Take by mouth. Take one in the morning & 2 in the afternoon    diclofenac (VOLTAREN) 50 MG EC tablet Take 50 mg by mouth 2 (two) times daily.   DULoxetine (CYMBALTA) 20 MG capsule Take 20 mg by mouth daily.   DULoxetine (CYMBALTA) 60 MG capsule Take 60 mg by mouth daily.   Evolocumab  (REPATHA  SURECLICK) 140 MG/ML SOAJ INJECT 140 MG INTO THE SKIN EVERY 14 (FOURTEEN) DAYS.   fluticasone  (FLONASE ) 50 MCG/ACT nasal spray SPRAY 2 SPRAYS INTO EACH NOSTRIL EVERY DAY   gabapentin (NEURONTIN) 300 MG capsule Take 300 mg by mouth 3 (three) times daily.   ipratropium (ATROVENT ) 0.03 % nasal spray PLACE 2 SPRAYS INTO BOTH NOSTRILS EVERY 12 (TWELVE) HOURS.   levothyroxine  (SYNTHROID ) 88 MCG tablet Take 1 tablet (88 mcg total) by mouth daily.   Multiple Vitamin (MULTIVITAMIN) tablet Take 1 tablet by mouth daily.   pantoprazole  (PROTONIX ) 40 MG tablet Take 1 tablet (40 mg total) by mouth daily.   traZODone (DESYREL) 50 MG tablet 50 mg. Takes 1/2 tab po prn   triamcinolone  cream (KENALOG ) 0.1 % Apply 1 application. topically 2 (two) times daily. Until rash resolves   valACYclovir (VALTREX) 500 MG tablet Take one to two tablets by mouth daily   vitamin B-12 (CYANOCOBALAMIN) 1000 MCG tablet Take 1 tablet (1,000 mcg total) by mouth daily.   No facility-administered encounter medications on file as of 06/02/2023.     Lab Results  Component Value Date   WBC 5.9 08/22/2022   HGB 12.7 08/22/2022   HCT 39.0 08/22/2022   PLT 387.0 08/22/2022   GLUCOSE 87 02/13/2023   CHOL 216 (H) 02/13/2023   TRIG 159.0 (H) 02/13/2023   HDL 61.00 02/13/2023   LDLDIRECT 213.0 05/27/2022   LDLCALC 124 (H) 02/13/2023   ALT 25 02/13/2023   AST 20 02/13/2023   NA 139 02/13/2023   K 4.4 02/13/2023   CL  103 02/13/2023   CREATININE 0.95 02/13/2023   BUN 24 (H) 02/13/2023   CO2 28 02/13/2023   TSH 0.87 02/13/2023   HGBA1C 5.9 02/13/2023    CT RENAL STONE STUDY Result Date: 09/06/2022 CLINICAL DATA:  12/16/2012 EXAM: CT ABDOMEN AND PELVIS WITHOUT CONTRAST TECHNIQUE: Multidetector CT  imaging of the abdomen and pelvis was performed following the standard protocol without IV contrast. RADIATION DOSE REDUCTION: This exam was performed according to the departmental dose-optimization program which includes automated exposure control, adjustment of the mA and/or kV according to patient size and/or use of iterative reconstruction technique. COMPARISON:  No acute abnormality FINDINGS: Lower chest: No focal hepatic abnormality. Gallbladder unremarkable. Hepatobiliary: No focal abnormality or ductal dilatation. Pancreas: No focal abnormality.  Normal size. Spleen: No adrenal abnormality. No focal renal abnormality. No stones or hydronephrosis. Urinary bladder is unremarkable. Adrenals/Urinary Tract: No adrenal abnormality. No focal renal abnormality. No stones or hydronephrosis. Urinary bladder is unremarkable. Stomach/Bowel: Stomach, large and small bowel grossly unremarkable. Vascular/Lymphatic: No evidence of aneurysm or adenopathy. Reproductive: Uterus and adnexa unremarkable.  No mass. Other: No free fluid or free air. Musculoskeletal: No acute bony abnormality. IMPRESSION: No renal or ureteral stones.  No hydronephrosis. No acute findings in the abdomen or pelvis. Electronically Signed   By: Janeece Mechanic M.D.   On: 09/06/2022 14:39       Assessment & Plan:  Balance problem Assessment & Plan:  Saw neurology 05/13/23 - for evaluation of unsteady gait. Recommended MRI brain, MRI cervical spine and EMG/NCS. Recommended continuing PT and consideration of ENT evaluation or vestibular rehab if no neurological causes identified. MRI brain 05/25/23 - normal. MRI c-spine - Multilevel degenerative changes without significant spinal canal stenosis.  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 Scheduled for NCS. Continue PT.    Hypercholesterolemia Assessment & Plan: Low cholesterol diet and exercise as tolerated.  Off crestor  due to  intolerance.  On repatha . Check lipid panel today.   Orders: -     Hepatic function panel -     Basic metabolic panel with GFR -     Lipid panel -     CBC with Differential/Platelet  Hyperglycemia Assessment & Plan: Low carb diet and exercise. Follow met b and A1c.   Orders: -     Basic metabolic panel with GFR -     Hemoglobin A1c  Hyperthyroidism Assessment & Plan: Off tapazole. On synthroid . Follow tsh.   Orders: -     TSH  Healthcare maintenance Assessment & Plan: Physical not performed today. Appt changed to follow up. PAP 03/15/20 - negative. satisfactory for evaluation.  Endocervical component is absent. HPV negative. Colonoscopy 12/22/22 - diverticulosis, non bleeding internal hemorrhoids.  Recommended f/u colonoscopy in 10 years.  Mammogram 03/27/22 - Birads I. She is planning to schedule.    Gastroesophageal reflux disease, unspecified whether esophagitis present Assessment & Plan: EGD 12/22/22 - pathology - Barretts - negative dysplasia.  (Gastritis noted on EGD).  No upper symptoms reported.    Injury of head, initial encounter Assessment & Plan: Had fall as outlined. (Yesterday evening). Did hit her head as outlined. Increased soft tissue swelling and abrasion - left cheek. Tender over this area. Described a hard hit - on concrete. Will obtain CT head to rule out bleed, facial bone injury. Follow symptoms.   Orders: -     CT HEAD WO CONTRAST ( ); Future  Moderate episode of recurrent major depressive  disorder [facility]) Assessment & Plan: Followed by psychiatry.  Continues on cymbalta. Follow. Increased stress recently as outlined. Information given for counseling information.    Unsteady gait Assessment & Plan: Persistent balance issues as outlined.   Saw neurology 05/13/23 - for evaluation of unsteady gait. Recommended MRI brain, MRI cervical spine and EMG/NCS. Recommended continuing PT and consideration of ENT evaluation or vestibular rehab if no neurological  causes identified. MRI brain 05/25/23 - normal. MRI c-spine - Multilevel degenerative changes without significant spinal canal stenosis.  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 Planning for NCS. Continue PT.     I spent 45 minutes with the patient. Time spent discussing her current concerns and symptoms.  Specifically time spent discussing recent fall and stress. Time also spent discussing further w/up, evaluation and treatment.    Dellar Fenton, MD

## 2023-06-02 NOTE — Assessment & Plan Note (Signed)
 Low-carb diet and exercise.  Follow met b and A1c.

## 2023-06-02 NOTE — Assessment & Plan Note (Signed)
 Saw neurology 05/13/23 - for evaluation of unsteady gait. Recommended MRI brain, MRI cervical spine and EMG/NCS. Recommended continuing PT and consideration of ENT evaluation or vestibular rehab if no neurological causes identified. MRI brain 05/25/23 - normal. MRI c-spine - Multilevel degenerative changes without significant spinal canal stenosis.  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 Scheduled for NCS. Continue PT.

## 2023-06-02 NOTE — Patient Instructions (Addendum)
   Olen Bern [phone] [address] [place], [place] [address]

## 2023-06-03 ENCOUNTER — Ambulatory Visit

## 2023-06-08 ENCOUNTER — Ambulatory Visit

## 2023-06-08 DIAGNOSIS — R2689 Other abnormalities of gait and mobility: Secondary | ICD-10-CM | POA: Diagnosis not present

## 2023-06-08 NOTE — Therapy (Signed)
 OUTPATIENT PHYSICAL THERAPY NEURO TREATMENT/MEDICARE PROGRESS NOTE/RE-CERT THROUGH 06/08/23   Patient Name: Amber Richardson MRN: [id number] DOB:[birth year], 68 y.o., female Today's Date: 06/08/2023  PCP: Dr. Geralyn Knee, MD  REFERRING PROVIDER: same  END OF SESSION:  PT End of Session - 06/08/23 1744     Visit Number 18    Number of Visits 22    Date for PT Re-Evaluation 06/08/23    Authorization Type 2x/week x 6 weeks (PN recert on 06/08/23) PN and recert done on 04/27/23    Authorization Time Period Medicare, PN needed at visit #20    PT Start Time 1420    PT Stop Time 1500    PT Time Calculation (min) 40 min    Equipment Utilized During Treatment Gait belt    Activity Tolerance Patient tolerated treatment well    Behavior During Therapy [facility] for tasks assessed/performed              Past Medical History:  Diagnosis Date   Allergies    Basal cell carcinoma    Fatigue 08/30/2014   Generalized anxiety disorder 03/21/2011   GERD (gastroesophageal reflux disease)    History of COVID-19 02/22/2019   HLD (hyperlipidemia)    HSV infection 09/20/2014   Hypercholesterolemia 02/08/2015   Hyperglycemia 08/28/2019   Hyperthyroidism    Major depressive disorder    Osteoporosis 05/13/2015   Pyelonephritis 1985   Scalp, arm, and leg tingling 08/28/2019   Tendinosis 09/12/2013   Rotator cuff tendinosis. Mild capsulitis.   Tremor 08/28/2019   Subtle to mild, upper extremities, left worse than right   Past Surgical History:  Procedure Laterality Date   DILATION AND CURETTAGE OF UTERUS     Miscarriage     x1   TONSILLECTOMY  1963   Patient Active Problem List   Diagnosis Date Noted   Head injury 06/02/2023   Balance problem 01/02/2023   Poison ivy dermatitis 09/26/2022   Urinary frequency 09/26/2022   Abnormal liver function tests 08/22/2022   Flank pain 08/22/2022   Cold intolerance 08/11/2022   Concussion 08/11/2022   Diarrhea 03/26/2022   Headache 01/29/2022    Melanoma in situ (HCC) 06/08/2021   Contact dermatitis and eczema due to plant 05/31/2021   Healthcare maintenance 11/11/2020   Rib pain on right side 09/30/2020   Joint pain 09/19/2020   Chest congestion 06/17/2020   Joint stiffness 06/17/2020   Change in vision 06/17/2020   Unsteady gait 06/17/2020   Moderate episode of recurrent major depressive disorder (HCC) 06/17/2020   Muscle weakness 06/13/2020   Stress 11/14/2019   Breast cancer screening 11/14/2019   Major depressive disorder    Disorder of bursae of shoulder region 09/01/2019   Knee pain 09/01/2019   Low back pain 09/01/2019   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   Tremor 08/28/2019   Hyperglycemia 08/28/2019   History of COVID-19 02/20/2019   Left arm pain 01/09/2019   Neck pain 09/04/2018   Dizziness 12/19/2017   Dyspnea 09/13/2017   Left facial numbness 06/14/2015   Osteoporosis 05/13/2015   Hypercholesterolemia 02/08/2015   Left foot pain 11/04/2014   Atrophic vaginitis 09/20/2014   HSV infection 09/20/2014   Pelvic pain in female 09/20/2014   Fatigue 08/30/2014   Chest pain 08/30/2014   Abdominal pain 08/29/2014   Tendinosis 09/12/2013   Basal cell carcinoma 03/17/2012   Hyperthyroidism 03/17/2012   Fibroids 01/19/2012   BV (bacterial vaginosis) 01/05/2012   PMB (postmenopausal bleeding) 01/05/2012   Generalized anxiety disorder 03/21/2011   GERD (  gastroesophageal reflux disease) 03/21/2011    ONSET DATE: 2021  REFERRING DIAG: R26.89 (ICD-10-CM) - Balance problem   THERAPY DIAG:  Balance problem  Rationale for Evaluation and Treatment: Rehabilitation  SUBJECTIVE: From initial evaluation note 03/02/23                                                                                                                                                                                            SUBJECTIVE STATEMENT: Pt states she is having difficulty with her balance.  She has been noticing it for several months.  It  is progessively worsening.  She feels unsteady on her feet at times while walking and has to lean into the wall or reach for support.  She attributes the balance changes to her long COVID recovery process.  She is not falling to floor.  But when she stands up and starts moving example: walking towards a door and then leans on the door or leans on the wall for balance; sometimes she is weaving when she ambulates.  She notices she veers to one side more than the other.  She has a consult with [facility] neurolog[facility] in March 2025 for a consult.  She had seen a neurologist earlier in the long COVID recovery process.  She has also seen her cardiologist and r/o cardiac reasons.    She has not been able to exercise a lot since COVID.  She still has sort term memory issues too.  She does walk 1.5 miles- flat neighborhood; she has not done any strength training.  If she "overdoes it" she will have a flare up for 2 days.    She is not lightheaded, no dizziness noted.  Pt accompanied by: self  PERTINENT HISTORY: Jan 2021- had COVID and long COVID sx; spent 6 months lying bed, had a tough recovery; has had exploratory R ankle arthroscopy, no other LE  or spine injuries or surgeries.  Retired Teacher, early years/pre   PAIN:  Are you having pain? No  PRECAUTIONS: Fall  RED FLAGS: None   WEIGHT BEARING RESTRICTIONS: No  FALLS: Has patient fallen in last 6 months? No, but catches herself with the wall/doorway  LIVING ENVIRONMENT: Lives with: lives with their spouse Lives in: House/apartment Stairs: yes, 3 story home- she is concerned about her balance going down the stairs- does not feel stable and leans to the R when going down the stairs; sometimes has R ankle pain while going down the stairs- has rail on the L but not on the R; has 3 steps to enter Has following equipment at home: None  PLOF: Independent  PATIENT GOALS: to improve  balance  OBJECTIVE:  Note: Objective measures were completed at Evaluation unless  otherwise noted. (Portions deferred secondary to time constraints as pt arrived late to appointment)  COGNITION: Overall cognitive status: Within functional limits for tasks assessed   SENSATION: Not tested  COORDINATION: deferred  POSTURE: rounded shoulders  LOWER EXTREMITY ROM:   functionally able to perform sit to stand independently  Active  Right Eval Left Eval  Hip flexion    Hip extension    Hip abduction    Hip adduction    Hip internal rotation    Hip external rotation    Knee flexion    Knee extension    Ankle dorsiflexion    Ankle plantarflexion    Ankle inversion    Ankle eversion     (Blank rows = not tested)  LOWER EXTREMITY MMT:    MMT Right Eval Left Eval  Hip flexion 4 4  Hip extension 4 4  Hip abduction    Hip adduction    Hip internal rotation    Hip external rotation    Knee flexion 4 4  Knee extension 4 4  Ankle dorsiflexion    Ankle plantarflexion    Ankle inversion    Ankle eversion    (Blank rows = not tested)   GAIT:  Will further assess at next visit  FUNCTIONAL TESTS:  5 times sit to stand: 15 seconds SL balance: R x 20 seconds, L x 12 seconds  PATIENT SURVEYS:  FGA to be administered at second visit                                                                                                                              TREATMENT DATE: 05/27/23 Subjective: Pt had her head and neck MRI done; has f/u ~06/10/23 with neurology to discuss results.   Still dealing with a lot of personal life stressors currently; makes working on HEP challenging. Trying to do a few of the exercises.  Doing a little walking.  Had 1 episode of loss of balance while beginning to walk while returning to standing from bending over to make the bed; leaned heavily into doorway.  Pain: 0/10  Objective: 5x STS: 9 seconds (was 15 seconds at initial evaluation) LE MMT: hip flexion 5/5 b/l (was 4/5 at initial evaluation) SLS balance R x 28 seconds, L x  14 seconds (was 20 and 12 at initial evaluation)  Functional Gait Assessment (FGA) administered: to be administered as re-test next session At initial evaluation- score 22/30 (indicates cut off for fall risk) Difficulty on stairs, amb with eyes closed, tandem walk, stepping over obstacle, amb with head turns horizontally>vertically  Modified Clinical Test of Sensory Interaction for Balance    (CTSIB): to be administered at next session  CONDITION TIME STRATEGY SWAY  Eyes open, firm surface 30 seconds ankle normal  Eyes closed, firm surface 30 seconds ankle Increased sway  Eyes open, foam surface 30 seconds ankle  Increased sway  Eyes closed, foam surface 30 seconds Ankle, hip, trunk Increased sway, reaches for b/l parallel bars    Neuro re-ed:  Sit on physioball: trunk/head diagonal D1/D2 look at cone with arms outstretched, follow with eyes x 10 ea direction, each diagonal Sit to stand from physioball x10 Sit to stand with head turn to R or L target on wall x10, 2 sets ea direction Sit to stand with head turn + initiating few steps to R or L combined x 8, 3 rounds ea direction Amb in hallway head turns R/L 2 laps and up/down 2 laps ea  Not today: SLS: 3x ea LE focusing on keeping eyes on target in front, 20-30 sec intervals in parallel bars Tandem stance, both ways, 3x with 5 lb med ball overhead lift x 5 reps, 2 sets - not today Tandem walk on grass: 3 laps x 20 ft with PT (gait belt and CGA)- significant trunk/hip lateral movement noted, pt uses stepping strategy to regain balance Grape vine stepping R/L in grass: 3 laps x 20 ft with PT (gait belt and supervision) no loss of balance  Blue ladder on floor: alternating high marches forward 3 laps, lateral high knees 2 laps, diagonal each direction 2 laps ea- not today Sit to stand with 360 deg turn, 5 lb medball toss from PT, turn opposite way, pass ball back to PT then sit: 3x CW and 3X CCW, repeated 2 sets Ball toss at trampoline (feet  together) on airex: forward, 5 lbs 3x30 seconds- not today Feet together on airex: head turns R/L 30 second intervals x 3, up/down 30 second intervals x3 (PT close supervision)- not today Weave in/out of cones- turning directions, practiced keeping eyes ahead, 4 laps- not today VOR: feet together 3x 30 seconds R/L and up/down standing- HEP  Sit to stand and then amb around corner with surface change (concrete/grass/mulch outdoors)- practiced R and L and 180 deg pivot turn.  Practiced keeping eyes ahead to scan environment instead of looking at feet. 3x ea- not today Bosu: step ups- RLLR, and LRRL x15 ea Bosu mini squats: 2x10 in parallel bars (standing flat surface)- not today Amb on grass outdoor surfaces: forward/reverse intervals 3x 25 ft with PT verbal cues for sudden direction changes; then head turns R/L 2 laps, look up/down 2 laps (gait belt, PT supervision)     MMT: hip abd R 4-/5, L 4/5 today SLS: L 18 seconds, R 25 seconds  Therapeutic Activities: Obstacle course: stepping over/around various heights, squat lift green physioball floor to waist, turn to R or L and put behind back, and 15 wall squats with physioball behind, set ball down, turn to R or L to return over obstacles.  Practiced multiple trials x 10 min, to simulate activities in home like bending to lift something and turning directions to carry it while also integrating LE strengthening with squat   Therapeutic Exercise: not today Sit to stand 2x12 holding 10 lb med ball- done as squats to hips tap table and hover hold for endurance Lateral band walk with blue TB at distal thighs, 4 laps x 20 ft Ascend/descend stairs: using single railing, x 4 laps- not today Stepping over obstacles: 4 laps in parallel bars (multi height hurdles)- not today Heel raises: 2x10 5# ankle weights- not today Toe raises: 2x10 5# ankle weights- not today Front step ups on 6 inch step: LRRL, RLLR x10 ea- not today Standing hamstring curl: 5#  2x12 Seated knee extension: 5# 2x12  Deadlifts: 2x9# dumbbells, 2x10- not today  HEP instruction- see below  PATIENT EDUCATION: Education details: PT POC/tx for balance and strength deficits Person educated: Patient Education method: Explanation Education comprehension: verbalized understanding  HOME EXERCISE PROGRAM: Access Code: [email or web address] URL: [email or web address] Date: 03/09/2023 Prepared by: Lucrecia Sables  Exercises - Sit to Stand Without Arm Support  - 1 x daily - 7 x weekly - 4 sets - 5 reps - Tandem Walking with Counter Support  - 1 x daily - 7 x weekly - 4 sets - Single Leg Stance  - 1 x daily - 7 x weekly - 3-5 reps - 15 hold - Seated Gaze Stabilization with Head Nod  - 1 x daily - 7 x weekly - 30 reps - 30 sec hold - Seated Gaze Stabilization with Head Rotation  - 1 x daily - 7 x weekly - 3 reps - 30 sec hold   GOALS: Goals reviewed with patient? Yes  SHORT TERM GOALS: Target date: 03/28/23  Pt will demonstrate ability to perform HEP for LE strengthening and balance exercises with proper technique/form in clinic Baseline: pt is not formally performing strength exercises currently at home; 04/27/23 in progress Goal status: In progress  LONG TERM GOALS: Target date: 06/08/23  Improve LE strength 1/2 MMT grade to facilitate improved ability to ambulate down her hallway at home and descend/ascend stairs in her home without losing balance Baseline: 4/5; 5/5 hip flexion (not knee extension or flex yet- still 4/5), ascending and descending stairs with improved balance, but hasn't been home to trial this in a few weeks Goal status: In progress  2.  Improve FGA score to >23 indicating decreased fall risk during ambulation Baseline: to be administered at 2nd visit; will modifiy this goal accordingly if needed Goal status: In progress  3.  Improve 5x STS time to <15 seconds indicating decreased fall risk in population age >76 y/o Baseline: 15; 04/27/23: 9  seconds best trial, 11 seconds other trial Goal status: MET   ASSESSMENT:  CLINICAL IMPRESSION: Pt had the most difficulty with multi-tasking balance exercises today including the combined motion of sit to stand with head turn to L and stepping to L to initiate ambulation around a corner.  Small losses of balance occurred and pt require stepping strategy and trunk compensatory pattern to regain balance.  PT close supervision needed for safety.  Likely not yet met max benefit from skilled PT, recommend continuing tx to address strength/balance/gait impairments to improve safety during her activities indoors and outdoors at home and to reduce fall risk.  OBJECTIVE IMPAIRMENTS: Abnormal gait, decreased activity tolerance, decreased balance, decreased endurance, decreased strength, and impaired perceived functional ability.   ACTIVITY LIMITATIONS: standing, transfers, and locomotion level  PARTICIPATION LIMITATIONS: cleaning, interpersonal relationship, shopping, community activity, and yard work  PERSONAL FACTORS: Age, Time since onset of injury/illness/exacerbation, and 1-2 comorbidities: long COVID, orthopedic hx (see above) are also affecting patient's functional outcome.   REHAB POTENTIAL: Good  CLINICAL DECISION MAKING: Evolving/moderate complexity  EVALUATION COMPLEXITY: Moderate  PLAN:  PT FREQUENCY: 2x/week  PT DURATION: 6 weeks  PLANNED INTERVENTIONS: 97110-Therapeutic exercises, 97530- Therapeutic activity, 97112- Neuromuscular re-education, 97535- Self Care, and 54098- Manual therapy  PLAN FOR NEXT SESSION: continue LE strengthening and balance exercises- emphasizing uneven surfaces, dynamic, dual tasking activities; VOR; multi stepped or sequenced/stacked complex balance exercises- progress note/re-cert needed at next visit  Lucrecia Sables, PT, DPT, OCS  Belanna Manring E Kaithlyn Teagle, PT 06/08/2023, 5:46 PM

## 2023-06-10 ENCOUNTER — Ambulatory Visit

## 2023-06-10 DIAGNOSIS — R2689 Other abnormalities of gait and mobility: Secondary | ICD-10-CM

## 2023-06-10 NOTE — Therapy (Signed)
 OUTPATIENT PHYSICAL THERAPY NEURO TREATMENT/MEDICARE PROGRESS NOTE/RE-CERT THROUGH 07/22/23   Patient Name: Amber Richardson MRN: [id number] DOB:03/23/1955, 68 y.o., female Today's Date: 06/10/2023  PCP: Dr. Geralyn Knee, MD  REFERRING PROVIDER: same  END OF SESSION:  PT End of Session - 06/10/23 1617     Visit Number 19    Number of Visits 34    Date for PT Re-Evaluation 07/22/23    Authorization Type 2x/week x 6 weeks (PN recert on 06/10/23)    Authorization Time Period Medicare, PN needed at visit #20    PT Start Time 1205    PT Stop Time 1246    PT Time Calculation (min) 41 min    Equipment Utilized During Treatment Gait belt    Activity Tolerance Patient tolerated treatment well    Behavior During Therapy [facility] for tasks assessed/performed              Past Medical History:  Diagnosis Date   Allergies    Basal cell carcinoma    Fatigue 08/30/2014   Generalized anxiety disorder 03/21/2011   GERD (gastroesophageal reflux disease)    History of COVID-19 02/22/2019   HLD (hyperlipidemia)    HSV infection 09/20/2014   Hypercholesterolemia 02/08/2015   Hyperglycemia 08/28/2019   Hyperthyroidism    Major depressive disorder    Osteoporosis 05/13/2015   Pyelonephritis 1985   Scalp, arm, and leg tingling 08/28/2019   Tendinosis 09/12/2013   Rotator cuff tendinosis. Mild capsulitis.   Tremor 08/28/2019   Subtle to mild, upper extremities, left worse than right   Past Surgical History:  Procedure Laterality Date   DILATION AND CURETTAGE OF UTERUS     Miscarriage     x1   TONSILLECTOMY  1963   Patient Active Problem List   Diagnosis Date Noted   Head injury 06/02/2023   Balance problem 01/02/2023   Poison ivy dermatitis 09/26/2022   Urinary frequency 09/26/2022   Abnormal liver function tests 08/22/2022   Flank pain 08/22/2022   Cold intolerance 08/11/2022   Concussion 08/11/2022   Diarrhea 03/26/2022   Headache 01/29/2022   Melanoma in situ (HCC) 06/08/2021    Contact dermatitis and eczema due to plant 05/31/2021   Healthcare maintenance 11/11/2020   Rib pain on right side 09/30/2020   Joint pain 09/19/2020   Chest congestion 06/17/2020   Joint stiffness 06/17/2020   Change in vision 06/17/2020   Unsteady gait 06/17/2020   Moderate episode of recurrent major depressive disorder (HCC) 06/17/2020   Muscle weakness 06/13/2020   Stress 11/14/2019   Breast cancer screening 11/14/2019   Major depressive disorder    Disorder of bursae of shoulder region 09/01/2019   Knee pain 09/01/2019   Low back pain 09/01/2019   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   Tremor 08/28/2019   Hyperglycemia 08/28/2019   History of COVID-19 02/20/2019   Left arm pain 01/09/2019   Neck pain 09/04/2018   Dizziness 12/19/2017   Dyspnea 09/13/2017   Left facial numbness 06/14/2015   Osteoporosis 05/13/2015   Hypercholesterolemia 02/08/2015   Left foot pain 11/04/2014   Atrophic vaginitis 09/20/2014   HSV infection 09/20/2014   Pelvic pain in female 09/20/2014   Fatigue 08/30/2014   Chest pain 08/30/2014   Abdominal pain 08/29/2014   Tendinosis 09/12/2013   Basal cell carcinoma 03/17/2012   Hyperthyroidism 03/17/2012   Fibroids 01/19/2012   BV (bacterial vaginosis) 01/05/2012   PMB (postmenopausal bleeding) 01/05/2012   Generalized anxiety disorder 03/21/2011   GERD (gastroesophageal reflux disease) 03/21/2011  ONSET DATE: 2021  REFERRING DIAG: R26.89 (ICD-10-CM) - Balance problem   THERAPY DIAG:  Balance problem  Rationale for Evaluation and Treatment: Rehabilitation  SUBJECTIVE: From initial evaluation note 03/02/23                                                                                                                                                                                            SUBJECTIVE STATEMENT: Pt states she is having difficulty with her balance.  She has been noticing it for several months.  It is progessively worsening.  She feels  unsteady on her feet at times while walking and has to lean into the wall or reach for support.  She attributes the balance changes to her long COVID recovery process.  She is not falling to floor.  But when she stands up and starts moving example: walking towards a door and then leans on the door or leans on the wall for balance; sometimes she is weaving when she ambulates.  She notices she veers to one side more than the other.  She has a consult with [facility] neurology in March 2025 for a consult.  She had seen a neurologist earlier in the long COVID recovery process.  She has also seen her cardiologist and r/o cardiac reasons.    She has not been able to exercise a lot since COVID.  She still has sort term memory issues too.  She does walk 1.5 miles- flat neighborhood; she has not done any strength training.  If she "overdoes it" she will have a flare up for 2 days.    She is not lightheaded, no dizziness noted.  Pt accompanied by: self  PERTINENT HISTORY: Jan 2021- had COVID and long COVID sx; spent 6 months lying bed, had a tough recovery; has had exploratory R ankle arthroscopy, no other LE  or spine injuries or surgeries.  Retired Teacher, early years/pre   PAIN:  Are you having pain? No  PRECAUTIONS: Fall  RED FLAGS: None   WEIGHT BEARING RESTRICTIONS: No  FALLS: Has patient fallen in last 6 months? No, but catches herself with the wall/doorway  LIVING ENVIRONMENT: Lives with: lives with their spouse Lives in: House/apartment Stairs: yes, 3 story home- she is concerned about her balance going down the stairs- does not feel stable and leans to the R when going down the stairs; sometimes has R ankle pain while going down the stairs- has rail on the L but not on the R; has 3 steps to enter Has following equipment at home: None  PLOF: Independent  PATIENT GOALS: to improve balance  OBJECTIVE:  Note: Objective measures  were completed at Evaluation unless otherwise noted. (Portions deferred  secondary to time constraints as pt arrived late to appointment)  COGNITION: Overall cognitive status: Within functional limits for tasks assessed   SENSATION: Not tested  COORDINATION: deferred  POSTURE: rounded shoulders  LOWER EXTREMITY ROM:   functionally able to perform sit to stand independently  Active  Right Eval Left Eval  Hip flexion    Hip extension    Hip abduction    Hip adduction    Hip internal rotation    Hip external rotation    Knee flexion    Knee extension    Ankle dorsiflexion    Ankle plantarflexion    Ankle inversion    Ankle eversion     (Blank rows = not tested)  LOWER EXTREMITY MMT:    MMT Right Eval Left Eval  Hip flexion 4 4  Hip extension 4 4  Hip abduction    Hip adduction    Hip internal rotation    Hip external rotation    Knee flexion 4 4  Knee extension 4 4  Ankle dorsiflexion    Ankle plantarflexion    Ankle inversion    Ankle eversion    (Blank rows = not tested)   GAIT:  Will further assess at next visit  FUNCTIONAL TESTS:  5 times sit to stand: 15 seconds SL balance: R x 20 seconds, L x 12 seconds  PATIENT SURVEYS:  FGA to be administered at second visit                                                                                                                              TREATMENT DATE: 06/10/23 Subjective: Pt had her head and neck MRI done; has f/u ~06/10/23 with neurology to discuss results.   Still dealing with a lot of personal life stressors currently; makes working on HEP challenging. Trying to do a few of the exercises.  Pt reports no new falls.  Last session was good; she did not experience fatigue after tx.  Pt goal to be more steady on her feet while walking around corners and uneven surfaces; reduce risk for falls; resume regular exercise walking again.  Pain: 0/10  Objective: progress note/goals updated, ABC administered today  SLS balance R x 28 seconds, L x 18 seconds (was 20 and 12 at  initial evaluation) LE MMT: hip abd R 4-/5, L 4/5 today Activities specific balance scale: 74%   Neuro re-ed:  Sit on physioball: trunk/head diagonal D1/D2 look at cone with arms outstretched, follow with eyes x 10 ea direction, each diagonal Sit to stand from physioball x10 Sit to stand with head turn to R or L target on wall x10, 2 sets ea direction Sit to stand with head turn + initiating few steps to R or L combined x 8, 3 rounds ea direction; then added intervals of weaving figure 8 in/out of cones x 4; then added intervals of  stepping over hurdles/various heights and obstacles x4; then added carrying sm Amb in hallway head turns R/L 2 laps and up/down 2 laps ea  Not today: SLS: 3x ea LE focusing on keeping eyes on target in front, 20-30 sec intervals in parallel bars Tandem stance, both ways, 3x with 5 lb med ball overhead lift x 5 reps, 2 sets - not today Tandem walk on grass: 3 laps x 20 ft with PT (gait belt and CGA)- significant trunk/hip lateral movement noted, pt uses stepping strategy to regain balance Grape vine stepping R/L in grass: 3 laps x 20 ft with PT (gait belt and supervision) no loss of balance  Blue ladder on floor: alternating high marches forward 3 laps, lateral high knees 2 laps, diagonal each direction 2 laps ea- not today Sit to stand with 360 deg turn, 5 lb medball toss from PT, turn opposite way, pass ball back to PT then sit: 3x CW and 3X CCW, repeated 2 sets Ball toss at trampoline (feet together) on airex: forward, 5 lbs 3x30 seconds- not today Feet together on airex: head turns R/L 30 second intervals x 3, up/down 30 second intervals x3 (PT close supervision)- not today Weave in/out of cones- turning directions, practiced keeping eyes ahead, 4 laps- not today VOR: feet together 3x 30 seconds R/L and up/down standing- HEP  Sit to stand and then amb around corner with surface change (concrete/grass/mulch outdoors)- practiced R and L and 180 deg pivot turn.   Practiced keeping eyes ahead to scan environment instead of looking at feet. 3x ea- not today Bosu: step ups- RLLR, and LRRL x15 ea Bosu mini squats: 2x10 in parallel bars (standing flat surface)- not today Amb on grass outdoor surfaces: forward/reverse intervals 3x 25 ft with PT verbal cues for sudden direction changes; then head turns R/L 2 laps, look up/down 2 laps (gait belt, PT supervision)       Therapeutic Activities: Obstacle course: stepping over/around various heights, squat lift green physioball floor to waist, turn to R or L and put behind back, and 15 wall squats with physioball behind, set ball down, turn to R or L to return over obstacles.  Practiced multiple trials x 10 min, to simulate activities in home like bending to lift something and turning directions to carry it while also integrating LE strengthening with squat  Added carrying box (shoe box in hands) for dual tasking while completing sit to stand with 180 deg turn to walk "around a corner" to R/L while holding something at home like a functional activity she describes having difficulty with at home and stepping around cones/over obstacles, having to scan environment with eyes for safety (PT supervision with gait belt)   Therapeutic Exercise: not today Sit to stand 2x12 holding 10 lb med ball- done as squats to hips tap table and hover hold for endurance Lateral band walk with blue TB at distal thighs, 4 laps x 20 ft Ascend/descend stairs: using single railing, x 4 laps- not today Stepping over obstacles: 4 laps in parallel bars (multi height hurdles)- not today Heel raises: 2x10 5# ankle weights- not today Toe raises: 2x10 5# ankle weights- not today Front step ups on 6 inch step: LRRL, RLLR x10 ea- not today Standing hamstring curl: 5# 2x12 Seated knee extension: 5# 2x12 Deadlifts: 2x9# dumbbells, 2x10- not today  HEP instruction- see below  PATIENT EDUCATION: Education details: PT POC/tx for balance and  strength deficits Person educated: Patient Education method: Explanation Education comprehension: verbalized understanding  HOME EXERCISE PROGRAM: Access Code: [email or web address] URL: [email or web address] Date: 03/09/2023 Prepared by: Lucrecia Sables  Exercises - Sit to Stand Without Arm Support  - 1 x daily - 7 x weekly - 4 sets - 5 reps - Tandem Walking with Counter Support  - 1 x daily - 7 x weekly - 4 sets - Single Leg Stance  - 1 x daily - 7 x weekly - 3-5 reps - 15 hold - Seated Gaze Stabilization with Head Nod  - 1 x daily - 7 x weekly - 30 reps - 30 sec hold - Seated Gaze Stabilization with Head Rotation  - 1 x daily - 7 x weekly - 3 reps - 30 sec hold   GOALS: Goals reviewed with patient? Yes  SHORT TERM GOALS: Target date: 03/28/23  Pt will demonstrate ability to perform HEP for LE strengthening and balance exercises with proper technique/form in clinic Baseline: pt is not formally performing strength exercises currently at home; 04/27/23 in progress Goal status: In progress  LONG TERM GOALS: Target date: 07/22/23  Improve LE strength 1/2 MMT grade to facilitate improved ability to ambulate down her hallway at home and descend/ascend stairs in her home without losing balance Baseline: 4/5; 5/5 hip flexion (not knee extension or flex yet- still 4/5), ascending and descending stairs with improved balance, but hasn't been home to trial this in a few weeks; pt reports she is ascending/descending stairs at home without difficulty, but continues to lean heavily into wall sometimes in her hallway if she is moving quickly and going around a corner from one room to the next Goal status: In progress  2.  Improve FGA score to >23 indicating decreased fall risk during ambulation Baseline: to be administered at 2nd visit; will modifiy this goal accordingly if needed Goal status: In progress  3.  Improve 5x STS time to <15 seconds indicating decreased fall risk in population age  >65 y/o Baseline: 15; 04/27/23: 9 seconds best trial, 11 seconds other trial Goal status: MET    4. Improve ABC score >13 points (> 88%) indicating reduced risk for falls   Baseline 06/10/23: 74%   Goal status: new  ASSESSMENT:  CLINICAL IMPRESSION: Pt had the most difficulty with multi-tasking balance exercises today including the combined motion of sit to stand with head turn to L and stepping to L to initiate ambulation around a corner.  Small losses of balance occurred and pt require stepping strategy and trunk compensatory pattern to regain balance.  PT close supervision needed for safety.  Overall, making progress towards PT goals; updated goals and added ABC scale goal too.  Likely not yet met max benefit from skilled PT, recommend continuing tx to address strength/balance/gait impairments to improve safety during her activities indoors and outdoors at home and to reduce fall risk.  OBJECTIVE IMPAIRMENTS: Abnormal gait, decreased activity tolerance, decreased balance, decreased endurance, decreased strength, and impaired perceived functional ability.   ACTIVITY LIMITATIONS: standing, transfers, and locomotion level  PARTICIPATION LIMITATIONS: cleaning, interpersonal relationship, shopping, community activity, and yard work  PERSONAL FACTORS: Age, Time since onset of injury/illness/exacerbation, and 1-2 comorbidities: long COVID, orthopedic hx (see above) are also affecting patient's functional outcome.   REHAB POTENTIAL: Good  CLINICAL DECISION MAKING: Evolving/moderate complexity  EVALUATION COMPLEXITY: Moderate  PLAN:  PT FREQUENCY: 2x/week  PT DURATION: 6 weeks  PLANNED INTERVENTIONS: 97110-Therapeutic exercises, 97530- Therapeutic activity, [id number]- Neuromuscular re-education, 97535- Self Care, and [id number]- Manual therapy  PLAN FOR NEXT SESSION: continue LE strengthening and balance  exercises- emphasizing uneven surfaces, dynamic, dual tasking activities; VOR; multi stepped or  sequenced/stacked complex balance exercises  Lucrecia Sables, PT, DPT, OCS  Winson Eichorn E Shyan Scalisi, PT 06/10/2023, 4:19 PM

## 2023-06-26 ENCOUNTER — Ambulatory Visit

## 2023-06-26 DIAGNOSIS — R2689 Other abnormalities of gait and mobility: Secondary | ICD-10-CM | POA: Diagnosis not present

## 2023-06-26 NOTE — Therapy (Signed)
 OUTPATIENT PHYSICAL THERAPY NEURO TREATMENT/MEDICARE PROGRESS NOTE/RE-CERT THROUGH 07/22/23   Patient Name: Amber Richardson MRN: [id number] DOB:07/10/1955, 68 y.o., female Today's Date: 06/26/2023  PCP: Dr. Geralyn Knee, MD  REFERRING PROVIDER: same  END OF SESSION:  PT End of Session - 06/26/23 1238     Visit Number 20    Number of Visits 34    Date for PT Re-Evaluation 07/22/23    Authorization Type 2x/week x 6 weeks (PN done on recert on 06/10/23)    Authorization Time Period Medicare, PN needed at visit #20 (done at visit #19)    PT Start Time 0935    PT Stop Time 1020    PT Time Calculation (min) 45 min    Equipment Utilized During Treatment Gait belt    Activity Tolerance Patient tolerated treatment well    Behavior During Therapy [facility] for tasks assessed/performed               Past Medical History:  Diagnosis Date   Allergies    Basal cell carcinoma    Fatigue 08/30/2014   Generalized anxiety disorder 03/21/2011   GERD (gastroesophageal reflux disease)    History of COVID-19 02/22/2019   HLD (hyperlipidemia)    HSV infection 09/20/2014   Hypercholesterolemia 02/08/2015   Hyperglycemia 08/28/2019   Hyperthyroidism    Major depressive disorder    Osteoporosis 05/13/2015   Pyelonephritis 1985   Scalp, arm, and leg tingling 08/28/2019   Tendinosis 09/12/2013   Rotator cuff tendinosis. Mild capsulitis.   Tremor 08/28/2019   Subtle to mild, upper extremities, left worse than right   Past Surgical History:  Procedure Laterality Date   DILATION AND CURETTAGE OF UTERUS     Miscarriage     x1   TONSILLECTOMY  1963   Patient Active Problem List   Diagnosis Date Noted   Head injury 06/02/2023   Balance problem 01/02/2023   Poison ivy dermatitis 09/26/2022   Urinary frequency 09/26/2022   Abnormal liver function tests 08/22/2022   Flank pain 08/22/2022   Cold intolerance 08/11/2022   Concussion 08/11/2022   Diarrhea 03/26/2022   Headache 01/29/2022    Melanoma in situ (HCC) 06/08/2021   Contact dermatitis and eczema due to plant 05/31/2021   Healthcare maintenance 11/11/2020   Rib pain on right side 09/30/2020   Joint pain 09/19/2020   Chest congestion 06/17/2020   Joint stiffness 06/17/2020   Change in vision 06/17/2020   Unsteady gait 06/17/2020   Moderate episode of recurrent major depressive disorder (HCC) 06/17/2020   Muscle weakness 06/13/2020   Stress 11/14/2019   Breast cancer screening 11/14/2019   Major depressive disorder    Disorder of bursae of shoulder region 09/01/2019   Knee pain 09/01/2019   Low back pain 09/01/2019   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   Tremor 08/28/2019   Hyperglycemia 08/28/2019   History of COVID-19 02/20/2019   Left arm pain 01/09/2019   Neck pain 09/04/2018   Dizziness 12/19/2017   Dyspnea 09/13/2017   Left facial numbness 06/14/2015   Osteoporosis 05/13/2015   Hypercholesterolemia 02/08/2015   Left foot pain 11/04/2014   Atrophic vaginitis 09/20/2014   HSV infection 09/20/2014   Pelvic pain in female 09/20/2014   Fatigue 08/30/2014   Chest pain 08/30/2014   Abdominal pain 08/29/2014   Tendinosis 09/12/2013   Basal cell carcinoma 03/17/2012   Hyperthyroidism 03/17/2012   Fibroids 01/19/2012   BV (bacterial vaginosis) 01/05/2012   PMB (postmenopausal bleeding) 01/05/2012   Generalized anxiety disorder 03/21/2011  GERD (gastroesophageal reflux disease) 03/21/2011    ONSET DATE: 2021  REFERRING DIAG: R26.89 (ICD-10-CM) - Balance problem   THERAPY DIAG:  Balance problem  Rationale for Evaluation and Treatment: Rehabilitation  SUBJECTIVE: From initial evaluation note 03/02/23                                                                                                                                                                                            SUBJECTIVE STATEMENT: Pt states she is having difficulty with her balance.  She has been noticing it for several months.  It  is progessively worsening.  She feels unsteady on her feet at times while walking and has to lean into the wall or reach for support.  She attributes the balance changes to her long COVID recovery process.  She is not falling to floor.  But when she stands up and starts moving example: walking towards a door and then leans on the door or leans on the wall for balance; sometimes she is weaving when she ambulates.  She notices she veers to one side more than the other.  She has a consult with [facility] neurolog[facility] in March 2025 for a consult.  She had seen a neurologist earlier in the long COVID recovery process.  She has also seen her cardiologist and r/o cardiac reasons.    She has not been able to exercise a lot since COVID.  She still has sort term memory issues too.  She does walk 1.5 miles- flat neighborhood; she has not done any strength training.  If she "overdoes it" she will have a flare up for 2 days.    She is not lightheaded, no dizziness noted.  Pt accompanied by: self  PERTINENT HISTORY: Jan 2021- had COVID and long COVID sx; spent 6 months lying bed, had a tough recovery; has had exploratory R ankle arthroscopy, no other LE  or spine injuries or surgeries.  Retired Teacher, early years/pre   PAIN:  Are you having pain? No  PRECAUTIONS: Fall  RED FLAGS: None   WEIGHT BEARING RESTRICTIONS: No  FALLS: Has patient fallen in last 6 months? No, but catches herself with the wall/doorway  LIVING ENVIRONMENT: Lives with: lives with their spouse Lives in: House/apartment Stairs: yes, 3 story home- she is concerned about her balance going down the stairs- does not feel stable and leans to the R when going down the stairs; sometimes has R ankle pain while going down the stairs- has rail on the L but not on the R; has 3 steps to enter Has following equipment at home: None  PLOF: Independent  PATIENT GOALS: to  improve balance  OBJECTIVE:  Note: Objective measures were completed at Evaluation unless  otherwise noted. (Portions deferred secondary to time constraints as pt arrived late to appointment)  COGNITION: Overall cognitive status: Within functional limits for tasks assessed   SENSATION: Not tested  COORDINATION: deferred  POSTURE: rounded shoulders  LOWER EXTREMITY ROM:   functionally able to perform sit to stand independently  Active  Right Eval Left Eval  Hip flexion    Hip extension    Hip abduction    Hip adduction    Hip internal rotation    Hip external rotation    Knee flexion    Knee extension    Ankle dorsiflexion    Ankle plantarflexion    Ankle inversion    Ankle eversion     (Blank rows = not tested)  LOWER EXTREMITY MMT:    MMT Right Eval Left Eval  Hip flexion 4 4  Hip extension 4 4  Hip abduction    Hip adduction    Hip internal rotation    Hip external rotation    Knee flexion 4 4  Knee extension 4 4  Ankle dorsiflexion    Ankle plantarflexion    Ankle inversion    Ankle eversion    (Blank rows = not tested)   GAIT:  Will further assess at next visit  FUNCTIONAL TESTS:  5 times sit to stand: 15 seconds SL balance: R x 20 seconds, L x 12 seconds  PATIENT SURVEYS:  FGA to be administered at second visit                                                                                                                              TREATMENT DATE: 06/26/23 Subjective: Pt reports no new falls.  Had to go up/down ladder to work on fixing a gutter, maintained balance, but felt very cautious; and still feeling a bit unsteady with getting up/turning and going in a curve.  Noticed her L toes are not as flexible as R foot and wonders if this contributes to her balance.   Pain: 0/10  Objective:   SLS balance R x 28 seconds, L x 18 seconds (was 20 and 12 at initial evaluation) LE MMT: hip abd R 4-/5, L 4/5 today Activities specific balance scale: 74%  Assessed Gr toe extension R and L at 80 deg extension Symmetrical MTP 2-5  extension AROM b/l L MTP 2-5 flexion AROM 50% limited compared to R and MMT 4/5 L vs 5/5 R for toe flexion   Neuro re-ed:  Sit on physioball: trunk/head diagonal D1/D2 look at cone with arms outstretched, follow with eyes x 10 ea direction, each diagonal Seated on physioball: alternating marches x 1 min, alternating knee extensions x 1 min Sit to stand from physioball x10 60 lb resisted trunk walk outs forward/back, right, left with turning head R/L- 8x ea scenario Sit to stand with head turn to R or L target  on wall x10, 2 sets ea direction Sit to stand with head turn + initiating few steps to R or L combined x 8, 3 rounds ea direction; then added intervals of weaving figure 8 in/out of cones x 4; then added intervals of stepping over hurdles/various heights and obstacles x4; then added carrying sm Amb in hallway head turns R/L 2 laps and up/down 2 laps ea  Not today: SLS: 3x ea LE focusing on keeping eyes on target in front, 20-30 sec intervals in parallel bars Tandem stance, both ways, 3x with 5 lb med ball overhead lift x 5 reps, 2 sets - not today Tandem walk on grass: 3 laps x 20 ft with PT (gait belt and CGA)- significant trunk/hip lateral movement noted, pt uses stepping strategy to regain balance Grape vine stepping R/L in grass: 3 laps x 20 ft with PT (gait belt and supervision) no loss of balance  Blue ladder on floor: alternating high marches forward 3 laps, lateral high knees 2 laps, diagonal each direction 2 laps ea- not today Sit to stand with 360 deg turn, 5 lb medball toss from PT, turn opposite way, pass ball back to PT then sit: 3x CW and 3X CCW, repeated 2 sets Ball toss at trampoline (feet together) on airex: forward, 5 lbs 3x30 seconds- not today Feet together on airex: head turns R/L 30 second intervals x 3, up/down 30 second intervals x3 (PT close supervision)- not today Weave in/out of cones- turning directions, practiced keeping eyes ahead, 4 laps- not today VOR: feet  together 3x 30 seconds R/L and up/down standing- HEP  Sit to stand and then amb around corner with surface change (concrete/grass/mulch outdoors)- practiced R and L and 180 deg pivot turn.  Practiced keeping eyes ahead to scan environment instead of looking at feet. 3x ea- not today Amb on grass outdoor surfaces: forward/reverse intervals 3x 25 ft with PT verbal cues for sudden direction changes; then head turns R/L 2 laps, look up/down 2 laps (gait belt, PT supervision)     Therapeutic Activities: Bosu: step ups- RLLR, and LRRL x15 ea Sit to stand holding 5 lb and walk in small CW or CWW circle around chair then sit down without hands- 8 reps ea direction (Not today) Obstacle course: stepping over/around various heights, squat lift green physioball floor to waist, turn to R or L and put behind back, and 15 wall squats with physioball behind, set ball down, turn to R or L to return over obstacles.  Practiced multiple trials x 10 min, to simulate activities in home like bending to lift something and turning directions to carry it while also integrating LE strengthening with squat  Added carrying box (shoe box in hands) for dual tasking while completing sit to stand with 180 deg turn to walk "around a corner" to R/L while holding something at home like a functional activity she describes having difficulty with at home and stepping around cones/over obstacles, having to scan environment with eyes for safety (PT supervision with gait belt)   Therapeutic Exercise:  Sit to stand 2x12 holding 10 lb med ball- done as squats to hips tap table and hover hold for endurance Towel curls L foot x 1 min Self toe flexion stretch L x 30 seconds, 2 reps (Not today) Lateral band walk with blue TB at distal thighs, 4 laps x 20 ft Ascend/descend stairs: using single railing, x 4 laps- not today Stepping over obstacles: 4 laps in parallel bars (multi height hurdles)-  not today Heel raises: 2x10 5# ankle weights-  not today Toe raises: 2x10 5# ankle weights- not today Front step ups on 6 inch step: LRRL, RLLR x10 ea- not today Standing hamstring curl: 5# 2x12 Seated knee extension: 5# 2x12 Deadlifts: 2x9# dumbbells, 2x10- not today  HEP instruction- see below  PATIENT EDUCATION: Education details: PT POC/tx for balance and strength deficits Person educated: Patient Education method: Explanation Education comprehension: verbalized understanding  HOME EXERCISE PROGRAM: Access Code: [email or web address] URL: [email or web address] Date: 03/09/2023 Prepared by: Lucrecia Sables  Exercises - Sit to Stand Without Arm Support  - 1 x daily - 7 x weekly - 4 sets - 5 reps - Tandem Walking with Counter Support  - 1 x daily - 7 x weekly - 4 sets - Single Leg Stance  - 1 x daily - 7 x weekly - 3-5 reps - 15 hold - Seated Gaze Stabilization with Head Nod  - 1 x daily - 7 x weekly - 30 reps - 30 sec hold - Seated Gaze Stabilization with Head Rotation  - 1 x daily - 7 x weekly - 3 reps - 30 sec hold   GOALS: Goals reviewed with patient? Yes  SHORT TERM GOALS: Target date: 03/28/23  Pt will demonstrate ability to perform HEP for LE strengthening and balance exercises with proper technique/form in clinic Baseline: pt is not formally performing strength exercises currently at home; 04/27/23 in progress Goal status: In progress  LONG TERM GOALS: Target date: 07/22/23  Improve LE strength 1/2 MMT grade to facilitate improved ability to ambulate down her hallway at home and descend/ascend stairs in her home without losing balance Baseline: 4/5; 5/5 hip flexion (not knee extension or flex yet- still 4/5), ascending and descending stairs with improved balance, but hasn't been home to trial this in a few weeks; pt reports she is ascending/descending stairs at home without difficulty, but continues to lean heavily into wall sometimes in her hallway if she is moving quickly and going around a corner from one room to  the next Goal status: In progress  2.  Improve FGA score to >23 indicating decreased fall risk during ambulation Baseline: to be administered at 2nd visit; will modifiy this goal accordingly if needed Goal status: In progress  3.  Improve 5x STS time to <15 seconds indicating decreased fall risk in population age >15 y/o Baseline: 15; 04/27/23: 9 seconds best trial, 11 seconds other trial Goal status: MET    4. Improve ABC score >13 points (> 88%) indicating reduced risk for falls   Baseline 06/10/23: 74%   Goal status: new  ASSESSMENT:  CLINICAL IMPRESSION: Pt was challenged with walkouts turning head R/L on nautilus today; required close supervision for balance.  Pt expressed concerns about her L foot mobility; she has normal great toe extension b/l AROM, L toe flexion at MTP limited compared to R and 4/5 strength in flexor digitorum mm L vs 5/5 on R.  Added stretch and strengthening for this to her HEP today.  Has sufficient great toe extension ROM for typical toe off at terminal stance.  Overall, making progress towards PT goals; Likely not yet met max benefit from skilled PT, recommend continuing tx to address strength/balance/gait impairments to improve safety during her activities indoors and outdoors at home and to reduce fall risk.  OBJECTIVE IMPAIRMENTS: Abnormal gait, decreased activity tolerance, decreased balance, decreased endurance, decreased strength, and impaired perceived functional ability.   ACTIVITY LIMITATIONS: standing, transfers, and locomotion level  PARTICIPATION  LIMITATIONS: cleaning, interpersonal relationship, shopping, community activity, and yard work  PERSONAL FACTORS: Age, Time since onset of injury/illness/exacerbation, and 1-2 comorbidities: long COVID, orthopedic hx (see above) are also affecting patient's functional outcome.   REHAB POTENTIAL: Good  CLINICAL DECISION MAKING: Evolving/moderate complexity  EVALUATION COMPLEXITY: Moderate  PLAN:  PT  FREQUENCY: 2x/week  PT DURATION: 6 weeks  PLANNED INTERVENTIONS: 97110-Therapeutic exercises, 97530- Therapeutic activity, 97112- Neuromuscular re-education, 97535- Self Care, and [id number]- Manual therapy  PLAN FOR NEXT SESSION: continue LE strengthening and balance exercises- emphasizing uneven surfaces, dynamic, dual tasking activities; VOR; multi stepped or sequenced/stacked complex balance exercises  Lucrecia Sables, PT, DPT, OCS  Kin Penner, PT 06/26/2023, 12:42 PM

## 2023-06-29 ENCOUNTER — Ambulatory Visit: Attending: Internal Medicine

## 2023-06-29 DIAGNOSIS — R42 Dizziness and giddiness: Secondary | ICD-10-CM | POA: Diagnosis present

## 2023-06-29 DIAGNOSIS — R2689 Other abnormalities of gait and mobility: Secondary | ICD-10-CM | POA: Diagnosis present

## 2023-06-29 DIAGNOSIS — R2681 Unsteadiness on feet: Secondary | ICD-10-CM | POA: Diagnosis present

## 2023-06-29 NOTE — Therapy (Signed)
 OUTPATIENT PHYSICAL THERAPY NEURO TREATMENT/MEDICARE PROGRESS NOTE/RE-CERT THROUGH 07/22/23   Patient Name: Amber Richardson MRN: [id number] DOB:08-10-1955, 68 y.o., female Today's Date: 06/29/2023  PCP: Dr. Geralyn Knee, MD  REFERRING PROVIDER: same  END OF SESSION:  PT End of Session - 06/29/23 1750     Visit Number 21    Number of Visits 34    Date for PT Re-Evaluation 07/22/23    Authorization Type 2x/week x 6 weeks (PN done on recert on 06/10/23)    Authorization Time Period Medicare, PN needed at visit #20 (done at visit #19)    PT Start Time 1200    PT Stop Time 1245    PT Time Calculation (min) 45 min    Equipment Utilized During Treatment Gait belt    Activity Tolerance Patient tolerated treatment well    Behavior During Therapy [facility] for tasks assessed/performed               Past Medical History:  Diagnosis Date   Allergies    Basal cell carcinoma    Fatigue 08/30/2014   Generalized anxiety disorder 03/21/2011   GERD (gastroesophageal reflux disease)    History of COVID-19 02/22/2019   HLD (hyperlipidemia)    HSV infection 09/20/2014   Hypercholesterolemia 02/08/2015   Hyperglycemia 08/28/2019   Hyperthyroidism    Major depressive disorder    Osteoporosis 05/13/2015   Pyelonephritis 1985   Scalp, arm, and leg tingling 08/28/2019   Tendinosis 09/12/2013   Rotator cuff tendinosis. Mild capsulitis.   Tremor 08/28/2019   Subtle to mild, upper extremities, left worse than right   Past Surgical History:  Procedure Laterality Date   DILATION AND CURETTAGE OF UTERUS     Miscarriage     x1   TONSILLECTOMY  1963   Patient Active Problem List   Diagnosis Date Noted   Head injury 06/02/2023   Balance problem 01/02/2023   Poison ivy dermatitis 09/26/2022   Urinary frequency 09/26/2022   Abnormal liver function tests 08/22/2022   Flank pain 08/22/2022   Cold intolerance 08/11/2022   Concussion 08/11/2022   Diarrhea 03/26/2022   Headache 01/29/2022   Melanoma  in situ (HCC) 06/08/2021   Contact dermatitis and eczema due to plant 05/31/2021   Healthcare maintenance 11/11/2020   Rib pain on right side 09/30/2020   Joint pain 09/19/2020   Chest congestion 06/17/2020   Joint stiffness 06/17/2020   Change in vision 06/17/2020   Unsteady gait 06/17/2020   Moderate episode of recurrent major depressive disorder (HCC) 06/17/2020   Muscle weakness 06/13/2020   Stress 11/14/2019   Breast cancer screening 11/14/2019   Major depressive disorder    Disorder of bursae of shoulder region 09/01/2019   Knee pain 09/01/2019   Low back pain 09/01/2019   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   Tremor 08/28/2019   Hyperglycemia 08/28/2019   History of COVID-19 02/20/2019   Left arm pain 01/09/2019   Neck pain 09/04/2018   Dizziness 12/19/2017   Dyspnea 09/13/2017   Left facial numbness 06/14/2015   Osteoporosis 05/13/2015   Hypercholesterolemia 02/08/2015   Left foot pain 11/04/2014   Atrophic vaginitis 09/20/2014   HSV infection 09/20/2014   Pelvic pain in female 09/20/2014   Fatigue 08/30/2014   Chest pain 08/30/2014   Abdominal pain 08/29/2014   Tendinosis 09/12/2013   Basal cell carcinoma 03/17/2012   Hyperthyroidism 03/17/2012   Fibroids 01/19/2012   BV (bacterial vaginosis) 01/05/2012   PMB (postmenopausal bleeding) 01/05/2012   Generalized anxiety disorder 03/21/2011  GERD (gastroesophageal reflux disease) 03/21/2011    ONSET DATE: 2021  REFERRING DIAG: R26.89 (ICD-10-CM) - Balance problem   THERAPY DIAG:  Balance problem  Rationale for Evaluation and Treatment: Rehabilitation  SUBJECTIVE: From initial evaluation note 03/02/23                                                                                                                                                                                            SUBJECTIVE STATEMENT: Pt states she is having difficulty with her balance.  She has been noticing it for several months.  It is  progessively worsening.  She feels unsteady on her feet at times while walking and has to lean into the wall or reach for support.  She attributes the balance changes to her long COVID recovery process.  She is not falling to floor.  But when she stands up and starts moving example: walking towards a door and then leans on the door or leans on the wall for balance; sometimes she is weaving when she ambulates.  She notices she veers to one side more than the other.  She has a consult with [facility] in March 2025 for a consult.  She had seen a neurologist earlier in the long COVID recovery process.  She has also seen her cardiologist and r/o cardiac reasons.    She has not been able to exercise a lot since COVID.  She still has sort term memory issues too.  She does walk 1.5 miles- flat neighborhood; she has not done any strength training.  If she "overdoes it" she will have a flare up for 2 days.    She is not lightheaded, no dizziness noted.  Pt accompanied by: self  PERTINENT HISTORY: Jan 2021- had COVID and long COVID sx; spent 6 months lying bed, had a tough recovery; has had exploratory R ankle arthroscopy, no other LE  or spine injuries or surgeries.  Retired Teacher, early years/pre   PAIN:  Are you having pain? No  PRECAUTIONS: Fall  RED FLAGS: None   WEIGHT BEARING RESTRICTIONS: No  FALLS: Has patient fallen in last 6 months? No, but catches herself with the wall/doorway  LIVING ENVIRONMENT: Lives with: lives with their spouse Lives in: House/apartment Stairs: yes, 3 story home- she is concerned about her balance going down the stairs- does not feel stable and leans to the R when going down the stairs; sometimes has R ankle pain while going down the stairs- has rail on the L but not on the R; has 3 steps to enter Has following equipment at home: None  PLOF: Independent  PATIENT GOALS: to  improve balance  OBJECTIVE:  Note: Objective measures were completed at Evaluation unless  otherwise noted. (Portions deferred secondary to time constraints as pt arrived late to appointment)  COGNITION: Overall cognitive status: Within functional limits for tasks assessed   SENSATION: Not tested  COORDINATION: deferred  POSTURE: rounded shoulders  LOWER EXTREMITY ROM:   functionally able to perform sit to stand independently  Active  Right Eval Left Eval  Hip flexion    Hip extension    Hip abduction    Hip adduction    Hip internal rotation    Hip external rotation    Knee flexion    Knee extension    Ankle dorsiflexion    Ankle plantarflexion    Ankle inversion    Ankle eversion     (Blank rows = not tested)  LOWER EXTREMITY MMT:    MMT Right Eval Left Eval  Hip flexion 4 4  Hip extension 4 4  Hip abduction    Hip adduction    Hip internal rotation    Hip external rotation    Knee flexion 4 4  Knee extension 4 4  Ankle dorsiflexion    Ankle plantarflexion    Ankle inversion    Ankle eversion    (Blank rows = not tested)   GAIT:  Will further assess at next visit  FUNCTIONAL TESTS:  5 times sit to stand: 15 seconds SL balance: R x 20 seconds, L x 12 seconds  PATIENT SURVEYS:  FGA to be administered at second visit                                                                                                                              TREATMENT DATE:06/29/23 Subjective: Pt reports no new falls.  Her new toe exercises are going well.  Feels off balance if turning head side to side when doing activities at home and in yard.  Pain: 0/10  Objective:   SLS balance R x 28 seconds, L x 18 seconds (was 20 and 12 at initial evaluation) LE MMT: hip abd R 4-/5, L 4/5 today Activities specific balance scale: 74%  Assessed Gr toe extension R and L at 80 deg extension Symmetrical MTP 2-5 extension AROM b/l L MTP 2-5 flexion AROM 50% limited compared to R and MMT 4/5 L vs 5/5 R for toe flexion   Neuro re-ed: 30 min Sit on physioball:  trunk/head diagonal D1/D2 look at cone with arms outstretched, follow with eyes x 10 ea direction, each diagonal Seated on physioball: alternating marches x 1 min, alternating knee extensions x 1 min Sit to stand from physioball x10 60 lb resisted trunk walk outs forward/back, right, left with turning head R/L- 8x ea scenario Sit to stand with head turn to R or L target on wall x10, 2 sets ea direction Sit to stand with head turn + initiating few steps to R or L combined x 8, 3 rounds ea  direction; then added intervals of weaving figure 8 in/out of cones x 4; then added intervals of stepping over hurdles/various heights and obstacles x4; then added carrying sm Amb in hallway head turns R/L 2 laps and up/down 2 laps ea then 2 laps tossing ball R/L follow with eyes  Not today: SLS: 3x ea LE focusing on keeping eyes on target in front, 20-30 sec intervals in parallel bars Tandem stance, both ways, 3x with 5 lb med ball overhead lift x 5 reps, 2 sets - not today Tandem walk on grass: 3 laps x 20 ft with PT (gait belt and CGA)- significant trunk/hip lateral movement noted, pt uses stepping strategy to regain balance Grape vine stepping R/L in grass: 3 laps x 20 ft with PT (gait belt and supervision) no loss of balance  Blue ladder on floor: alternating high marches forward 3 laps, lateral high knees 2 laps, diagonal each direction 2 laps ea- not today Sit to stand with 360 deg turn, 5 lb medball toss from PT, turn opposite way, pass ball back to PT then sit: 3x CW and 3X CCW, repeated 2 sets Ball toss at trampoline (feet together) on airex: forward, 5 lbs 3x30 seconds- not today Feet together on airex: head turns R/L 30 second intervals x 3, up/down 30 second intervals x3 (PT close supervision)- not today Weave in/out of cones- turning directions, practiced keeping eyes ahead, 4 laps- not today VOR: feet together 3x 30 seconds R/L and up/down standing- HEP  Sit to stand and then amb around corner with  surface change (concrete/grass/mulch outdoors)- practiced R and L and 180 deg pivot turn.  Practiced keeping eyes ahead to scan environment instead of looking at feet. 3x ea- not today Amb on grass outdoor surfaces: forward/reverse intervals 3x 25 ft with PT verbal cues for sudden direction changes; then head turns R/L 2 laps, look up/down 2 laps (gait belt, PT supervision)     Therapeutic Activities: Not today  Sit to stand holding 5 lb and walk in small CW or CWW circle around chair then sit down without hands- 8 reps ea direction (Not today) Obstacle course: stepping over/around various heights, squat lift green physioball floor to waist, turn to R or L and put behind back, and 15 wall squats with physioball behind, set ball down, turn to R or L to return over obstacles.  Practiced multiple trials x 10 min, to simulate activities in home like bending to lift something and turning directions to carry it while also integrating LE strengthening with squat  Added carrying box (shoe box in hands) for dual tasking while completing sit to stand with 180 deg turn to walk "around a corner" to R/L while holding something at home like a functional activity she describes having difficulty with at home and stepping around cones/over obstacles, having to scan environment with eyes for safety (PT supervision with gait belt)   Therapeutic Exercise: 10 min Sit to stand 2x12 holding 10 lb med ball- done as squats to hips tap table and hover hold for endurance Towel curls L foot x 1 min Self toe flexion stretch L x 30 seconds, 2 reps Bosu: step ups- RLLR, and LRRL x15 ea  (Not today) Lateral band walk with blue TB at distal thighs, 4 laps x 20 ft Ascend/descend stairs: using single railing, x 4 laps- not today Stepping over obstacles: 4 laps in parallel bars (multi height hurdles)- not today Heel raises: 2x10 5# ankle weights- not today Toe raises: 2x10  5# ankle weights- not today Front step ups on 6 inch  step: LRRL, RLLR x10 ea- not today Standing hamstring curl: 5# 2x12 Seated knee extension: 5# 2x12 Deadlifts: 2x9# dumbbells, 2x10- not today  HEP instruction- see below  PATIENT EDUCATION: Education details: PT POC/tx for balance and strength deficits Person educated: Patient Education method: Explanation Education comprehension: verbalized understanding  HOME EXERCISE PROGRAM: Access Code: [email or web address] URL: [email or web address] Date: 03/09/2023 Prepared by: Lucrecia Sables  Exercises - Sit to Stand Without Arm Support  - 1 x daily - 7 x weekly - 4 sets - 5 reps - Tandem Walking with Counter Support  - 1 x daily - 7 x weekly - 4 sets - Single Leg Stance  - 1 x daily - 7 x weekly - 3-5 reps - 15 hold - Seated Gaze Stabilization with Head Nod  - 1 x daily - 7 x weekly - 30 reps - 30 sec hold - Seated Gaze Stabilization with Head Rotation  - 1 x daily - 7 x weekly - 3 reps - 30 sec hold   GOALS: Goals reviewed with patient? Yes  SHORT TERM GOALS: Target date: 03/28/23  Pt will demonstrate ability to perform HEP for LE strengthening and balance exercises with proper technique/form in clinic Baseline: pt is not formally performing strength exercises currently at home; 04/27/23 in progress Goal status: In progress  LONG TERM GOALS: Target date: 07/22/23  Improve LE strength 1/2 MMT grade to facilitate improved ability to ambulate down her hallway at home and descend/ascend stairs in her home without losing balance Baseline: 4/5; 5/5 hip flexion (not knee extension or flex yet- still 4/5), ascending and descending stairs with improved balance, but hasn't been home to trial this in a few weeks; pt reports she is ascending/descending stairs at home without difficulty, but continues to lean heavily into wall sometimes in her hallway if she is moving quickly and going around a corner from one room to the next Goal status: In progress  2.  Improve FGA score to >23 indicating  decreased fall risk during ambulation Baseline: to be administered at 2nd visit; will modifiy this goal accordingly if needed Goal status: In progress  3.  Improve 5x STS time to <15 seconds indicating decreased fall risk in population age >13 y/o Baseline: 15; 04/27/23: 9 seconds best trial, 11 seconds other trial Goal status: MET    4. Improve ABC score >13 points (> 88%) indicating reduced risk for falls   Baseline 06/10/23: 74%   Goal status: new  ASSESSMENT:  CLINICAL IMPRESSION: Pt had no major losses of balance, feet did cross midline to steady herself during amb in hallway with head turns R/L.  Overall, making progress towards PT goals; Likely not yet met max benefit from skilled PT, recommend continuing tx to address strength/balance/gait impairments to improve safety during her activities indoors and outdoors at home and to reduce fall risk.  OBJECTIVE IMPAIRMENTS: Abnormal gait, decreased activity tolerance, decreased balance, decreased endurance, decreased strength, and impaired perceived functional ability.   ACTIVITY LIMITATIONS: standing, transfers, and locomotion level  PARTICIPATION LIMITATIONS: cleaning, interpersonal relationship, shopping, community activity, and yard work  PERSONAL FACTORS: Age, Time since onset of injury/illness/exacerbation, and 1-2 comorbidities: long COVID, orthopedic hx (see above) are also affecting patient's functional outcome.   REHAB POTENTIAL: Good  CLINICAL DECISION MAKING: Evolving/moderate complexity  EVALUATION COMPLEXITY: Moderate  PLAN:  PT FREQUENCY: 2x/week  PT DURATION: 6 weeks  PLANNED INTERVENTIONS: 97110-Therapeutic exercises, 97530- Therapeutic activity, [id number]- Neuromuscular  re-education, [phone]- Self Care, and [id number]- Manual therapy  PLAN FOR NEXT SESSION: continue LE strengthening and balance exercises- emphasizing uneven surfaces, dynamic, dual tasking activities; VOR; multi stepped or sequenced/stacked complex balance  exercises  Lucrecia Sables, PT, DPT, OCS  Alastair Hennes E Yesenia Fontenette, PT 06/29/2023, 5:51 PM

## 2023-07-01 ENCOUNTER — Encounter

## 2023-07-02 ENCOUNTER — Telehealth: Payer: Self-pay

## 2023-07-02 DIAGNOSIS — R7989 Other specified abnormal findings of blood chemistry: Secondary | ICD-10-CM

## 2023-07-02 DIAGNOSIS — E0590 Thyrotoxicosis, unspecified without thyrotoxic crisis or storm: Secondary | ICD-10-CM

## 2023-07-02 NOTE — Telephone Encounter (Signed)
 Copied from CRM [id number]. Topic: Clinical - Request for Lab/Test Order >> Jul 02, 2023  9:43 AM Amber Richardson wrote: Reason for CRM: Patient is calling to see if she could come into the office today for thyroid  labs I advised that there were no orders but patient states that Dr.Scott's nurse is aware of the change in her Synthroid  and would like to repeat labs.

## 2023-07-03 NOTE — Telephone Encounter (Signed)
LM to clarify what is needed.

## 2023-07-03 NOTE — Telephone Encounter (Signed)
 Tsh lab test and a cbc for her wbc she is always tired

## 2023-07-06 ENCOUNTER — Other Ambulatory Visit (INDEPENDENT_AMBULATORY_CARE_PROVIDER_SITE_OTHER)

## 2023-07-06 ENCOUNTER — Ambulatory Visit

## 2023-07-06 DIAGNOSIS — R2689 Other abnormalities of gait and mobility: Secondary | ICD-10-CM

## 2023-07-06 DIAGNOSIS — R7989 Other specified abnormal findings of blood chemistry: Secondary | ICD-10-CM | POA: Diagnosis not present

## 2023-07-06 DIAGNOSIS — E0590 Thyrotoxicosis, unspecified without thyrotoxic crisis or storm: Secondary | ICD-10-CM

## 2023-07-06 NOTE — Addendum Note (Signed)
 Addended by: Thressa Flora D on: 07/06/2023 02:08 PM   Modules accepted: Orders

## 2023-07-06 NOTE — Telephone Encounter (Signed)
 Pt came in for labs.

## 2023-07-06 NOTE — Telephone Encounter (Signed)
 Per review of last lab note, she was to have a repeat liver panel, GGT and TSH at her 08/07/23 appt. I am ok if she wants to get these labs earlier. We just checked a cbc and it was wnl.

## 2023-07-06 NOTE — Telephone Encounter (Signed)
 Are you ok with checking this with out seeing her or would you prefer to see her first?

## 2023-07-06 NOTE — Therapy (Signed)
 OUTPATIENT PHYSICAL THERAPY NEURO TREATMENT/MEDICARE PROGRESS NOTE/RE-CERT THROUGH 07/22/23   Patient Name: Amber Richardson MRN: [id number] DOB:[birth year], 68 y.o., female Today's Date: 07/06/2023  PCP: Dr. Geralyn Knee, MD  REFERRING PROVIDER: same  END OF SESSION:  PT End of Session - 07/06/23 1224     Visit Number 22    Number of Visits 34    Date for PT Re-Evaluation 07/22/23    Authorization Type 2x/week x 6 weeks (PN done on recert on 06/10/23)    Authorization Time Period Medicare, PN needed at visit #20 (done at visit #19)    PT Start Time 1215    PT Stop Time 1300    PT Time Calculation (min) 45 min    Equipment Utilized During Treatment Gait belt    Activity Tolerance Patient tolerated treatment well    Behavior During Therapy [facility] for tasks assessed/performed               Past Medical History:  Diagnosis Date   Allergies    Basal cell carcinoma    Fatigue 08/30/2014   Generalized anxiety disorder 03/21/2011   GERD (gastroesophageal reflux disease)    History of COVID-19 02/22/2019   HLD (hyperlipidemia)    HSV infection 09/20/2014   Hypercholesterolemia 02/08/2015   Hyperglycemia 08/28/2019   Hyperthyroidism    Major depressive disorder    Osteoporosis 05/13/2015   Pyelonephritis 1985   Scalp, arm, and leg tingling 08/28/2019   Tendinosis 09/12/2013   Rotator cuff tendinosis. Mild capsulitis.   Tremor 08/28/2019   Subtle to mild, upper extremities, left worse than right   Past Surgical History:  Procedure Laterality Date   DILATION AND CURETTAGE OF UTERUS     Miscarriage     x1   TONSILLECTOMY  1963   Patient Active Problem List   Diagnosis Date Noted   Head injury 06/02/2023   Balance problem 01/02/2023   Poison ivy dermatitis 09/26/2022   Urinary frequency 09/26/2022   Abnormal liver function tests 08/22/2022   Flank pain 08/22/2022   Cold intolerance 08/11/2022   Concussion 08/11/2022   Diarrhea 03/26/2022   Headache 01/29/2022   Melanoma  in situ (HCC) 06/08/2021   Contact dermatitis and eczema due to plant 05/31/2021   Healthcare maintenance 11/11/2020   Rib pain on right side 09/30/2020   Joint pain 09/19/2020   Chest congestion 06/17/2020   Joint stiffness 06/17/2020   Change in vision 06/17/2020   Unsteady gait 06/17/2020   Moderate episode of recurrent major depressive disorder (HCC) 06/17/2020   Muscle weakness 06/13/2020   Stress 11/14/2019   Breast cancer screening 11/14/2019   Major depressive disorder    Disorder of bursae of shoulder region 09/01/2019   Knee pain 09/01/2019   Low back pain 09/01/2019   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   Tremor 08/28/2019   Hyperglycemia 08/28/2019   History of COVID-19 02/20/2019   Left arm pain 01/09/2019   Neck pain 09/04/2018   Dizziness 12/19/2017   Dyspnea 09/13/2017   Left facial numbness 06/14/2015   Osteoporosis 05/13/2015   Hypercholesterolemia 02/08/2015   Left foot pain 11/04/2014   Atrophic vaginitis 09/20/2014   HSV infection 09/20/2014   Pelvic pain in female 09/20/2014   Fatigue 08/30/2014   Chest pain 08/30/2014   Abdominal pain 08/29/2014   Tendinosis 09/12/2013   Basal cell carcinoma 03/17/2012   Hyperthyroidism 03/17/2012   Fibroids 01/19/2012   BV (bacterial vaginosis) 01/05/2012   PMB (postmenopausal bleeding) 01/05/2012   Generalized anxiety disorder 03/21/2011  GERD (gastroesophageal reflux disease) 03/21/2011    ONSET DATE: 2021  REFERRING DIAG: R26.89 (ICD-10-CM) - Balance problem   THERAPY DIAG:  Balance problem  Rationale for Evaluation and Treatment: Rehabilitation  SUBJECTIVE: From initial evaluation note 03/02/23                                                                                                                                                                                            SUBJECTIVE STATEMENT: Pt states she is having difficulty with her balance.  She has been noticing it for several months.  It is  progessively worsening.  She feels unsteady on her feet at times while walking and has to lean into the wall or reach for support.  She attributes the balance changes to her long COVID recovery process.  She is not falling to floor.  But when she stands up and starts moving example: walking towards a door and then leans on the door or leans on the wall for balance; sometimes she is weaving when she ambulates.  She notices she veers to one side more than the other.  She has a consult with [facility] in March 2025 for a consult.  She had seen a neurologist earlier in the long COVID recovery process.  She has also seen her cardiologist and r/o cardiac reasons.    She has not been able to exercise a lot since COVID.  She still has sort term memory issues too.  She does walk 1.5 miles- flat neighborhood; she has not done any strength training.  If she overdoes it she will have a flare up for 2 days.    She is not lightheaded, no dizziness noted.  Pt accompanied by: self  PERTINENT HISTORY: Jan 2021- had COVID and long COVID sx; spent 6 months lying bed, had a tough recovery; has had exploratory R ankle arthroscopy, no other LE  or spine injuries or surgeries.  Retired Teacher, early years/pre   PAIN:  Are you having pain? No  PRECAUTIONS: Fall  RED FLAGS: None   WEIGHT BEARING RESTRICTIONS: No  FALLS: Has patient fallen in last 6 months? No, but catches herself with the wall/doorway  LIVING ENVIRONMENT: Lives with: lives with their spouse Lives in: House/apartment Stairs: yes, 3 story home- she is concerned about her balance going down the stairs- does not feel stable and leans to the R when going down the stairs; sometimes has R ankle pain while going down the stairs- has rail on the L but not on the R; has 3 steps to enter Has following equipment at home: None  PLOF: Independent  PATIENT GOALS: to  improve balance  OBJECTIVE:  Note: Objective measures were completed at Evaluation unless  otherwise noted. (Portions deferred secondary to time constraints as pt arrived late to appointment)  COGNITION: Overall cognitive status: Within functional limits for tasks assessed   SENSATION: Not tested  COORDINATION: deferred  POSTURE: rounded shoulders  LOWER EXTREMITY ROM:   functionally able to perform sit to stand independently  Active  Right Eval Left Eval  Hip flexion    Hip extension    Hip abduction    Hip adduction    Hip internal rotation    Hip external rotation    Knee flexion    Knee extension    Ankle dorsiflexion    Ankle plantarflexion    Ankle inversion    Ankle eversion     (Blank rows = not tested)  LOWER EXTREMITY MMT:    MMT Right Eval Left Eval  Hip flexion 4 4  Hip extension 4 4  Hip abduction    Hip adduction    Hip internal rotation    Hip external rotation    Knee flexion 4 4  Knee extension 4 4  Ankle dorsiflexion    Ankle plantarflexion    Ankle inversion    Ankle eversion    (Blank rows = not tested)   GAIT:  Will further assess at next visit  FUNCTIONAL TESTS:  5 times sit to stand: 15 seconds SL balance: R x 20 seconds, L x 12 seconds  PATIENT SURVEYS:  FGA to be administered at second visit                                                                                                                              TREATMENT DATE:07/06/23 Subjective: Pt reports no new falls.  No episodes of feeling off balance this week  She feels like her balance is improved this week.  Dealing with some fatigue, not sure if it is thyroid  related.  Has reached out to PCP about.    Pain: 0/10  Objective:   SLS balance R x 28 seconds, L x 18 seconds (was 20 and 12 at initial evaluation) LE MMT: hip abd R 4-/5, L 4/5 today Activities specific balance scale: 74%  Assessed Gr toe extension R and L at 80 deg extension Symmetrical MTP 2-5 extension AROM b/l L MTP 2-5 flexion AROM 50% limited compared to R and MMT 4/5 L vs 5/5 R  for toe flexion   Neuro re-ed: 30 min Sit on physioball: trunk/head diagonal D1/D2 look at cone with arms outstretched, follow with eyes x 10 ea direction, each diagonal Seated on physioball: alternating marches x 1 min, alternating knee extensions x 1 min Sit to stand from physioball x10 60 lb resisted trunk walk outs forward/back, right, left with turning head R/L- 8x ea scenario- not today Sit to stand with head turn to R or L target on wall x10, 2 sets ea direction Sit to stand with  head turn + initiating few steps to R or L combined x 8, 3 rounds ea direction; then added intervals of weaving figure 8 in/out of cones x 4; then added intervals of stepping over hurdles/various heights and obstacles x4; then added carrying sm Amb in hallway head turns R/L 2 laps and up/down 2 laps ea then 2 laps tossing ball R/L follow with eyes Tandem walk 4x20 ft with eyes focused on various targets, not looking at feet   Not today: SLS: 3x ea LE focusing on keeping eyes on target in front, 20-30 sec intervals in parallel bars Tandem stance, both ways, 3x with 5 lb med ball overhead lift x 5 reps, 2 sets - not today Tandem walk on grass: 3 laps x 20 ft with PT (gait belt and CGA)- significant trunk/hip lateral movement noted, pt uses stepping strategy to regain balance Grape vine stepping R/L in grass: 3 laps x 20 ft with PT (gait belt and supervision) no loss of balance  Blue ladder on floor: alternating high marches forward 3 laps, lateral high knees 2 laps, diagonal each direction 2 laps ea- not today Sit to stand with 360 deg turn, 5 lb medball toss from PT, turn opposite way, pass ball back to PT then sit: 3x CW and 3X CCW, repeated 2 sets Ball toss at trampoline (feet together) on airex: forward, 5 lbs 3x30 seconds- not today Feet together on airex: head turns R/L 30 second intervals x 3, up/down 30 second intervals x3 (PT close supervision)- not today Weave in/out of cones- turning directions,  practiced keeping eyes ahead, 4 laps- not today VOR: feet together 3x 30 seconds R/L and up/down standing- HEP  Sit to stand and then amb around corner with surface change (concrete/grass/mulch outdoors)- practiced R and L and 180 deg pivot turn.  Practiced keeping eyes ahead to scan environment instead of looking at feet. 3x ea- not today Amb on grass outdoor surfaces: forward/reverse intervals 3x 25 ft with PT verbal cues for sudden direction changes; then head turns R/L 2 laps, look up/down 2 laps (gait belt, PT supervision)     Therapeutic Activities: Not today  Sit to stand holding 5 lb and walk in small CW or CWW circle around chair then sit down without hands- 8 reps ea direction (Not today) Obstacle course: stepping over/around various heights, squat lift green physioball floor to waist, turn to R or L and put behind back, and 15 wall squats with physioball behind, set ball down, turn to R or L to return over obstacles.  Practiced multiple trials x 10 min, to simulate activities in home like bending to lift something and turning directions to carry it while also integrating LE strengthening with squat  Added carrying box (shoe box in hands) for dual tasking while completing sit to stand with 180 deg turn to walk around a corner to R/L while holding something at home like a functional activity she describes having difficulty with at home and stepping around cones/over obstacles, having to scan environment with eyes for safety (PT supervision with gait belt)   Therapeutic Exercise: 10 min Sit to stand 2x12 holding 10 lb med ball- done as squats to hips tap table and hover hold for endurance Towel curls L foot x 1 min Chair squats x15 Heel raises x20, 2 sets Toe raises x20, 2 sets Self toe flexion stretch L x 30 seconds, 2 reps- not today Bosu: step ups- RLLR, and LRRL x15 ea  (Not today) Lateral band  walk with blue TB at distal thighs, 4 laps x 20 ft Ascend/descend stairs: using  single railing, x 4 laps- not today Stepping over obstacles: 4 laps in parallel bars (multi height hurdles)- not today Heel raises: 2x10 5# ankle weights- not today Toe raises: 2x10 5# ankle weights- not today Front step ups on 6 inch step: LRRL, RLLR x10 ea- not today Standing hamstring curl: 5# 2x12 Seated knee extension: 5# 2x12 Deadlifts: 2x9# dumbbells, 2x10- not today  HEP instruction- see below  PATIENT EDUCATION: Education details: PT POC/tx for balance and strength deficits Person educated: Patient Education method: Explanation Education comprehension: verbalized understanding  HOME EXERCISE PROGRAM: Access Code: [email or web address] URL: [email or web address] Date: 03/09/2023 Prepared by: Lucrecia Sables  Exercises - Sit to Stand Without Arm Support  - 1 x daily - 7 x weekly - 4 sets - 5 reps - Tandem Walking with Counter Support  - 1 x daily - 7 x weekly - 4 sets - Single Leg Stance  - 1 x daily - 7 x weekly - 3-5 reps - 15 hold - Seated Gaze Stabilization with Head Nod  - 1 x daily - 7 x weekly - 30 reps - 30 sec hold - Seated Gaze Stabilization with Head Rotation  - 1 x daily - 7 x weekly - 3 reps - 30 sec hold   GOALS: Goals reviewed with patient? Yes  SHORT TERM GOALS: Target date: 03/28/23  Pt will demonstrate ability to perform HEP for LE strengthening and balance exercises with proper technique/form in clinic Baseline: pt is not formally performing strength exercises currently at home; 04/27/23 in progress Goal status: In progress  LONG TERM GOALS: Target date: 07/22/23  Improve LE strength 1/2 MMT grade to facilitate improved ability to ambulate down her hallway at home and descend/ascend stairs in her home without losing balance Baseline: 4/5; 5/5 hip flexion (not knee extension or flex yet- still 4/5), ascending and descending stairs with improved balance, but hasn't been home to trial this in a few weeks; pt reports she is ascending/descending stairs at  home without difficulty, but continues to lean heavily into wall sometimes in her hallway if she is moving quickly and going around a corner from one room to the next Goal status: In progress  2.  Improve FGA score to >23 indicating decreased fall risk during ambulation Baseline: to be administered at 2nd visit; will modifiy this goal accordingly if needed Goal status: In progress  3.  Improve 5x STS time to <15 seconds indicating decreased fall risk in population age >29 y/o Baseline: 15; 04/27/23: 9 seconds best trial, 11 seconds other trial Goal status: MET    4. Improve ABC score >13 points (> 88%) indicating reduced risk for falls   Baseline 06/10/23: 74%   Goal status: new  ASSESSMENT:  CLINICAL IMPRESSION: Pt had no major losses of balance, feet did cross midline to steady herself during amb in hallway with head turns R/L.  Improved balance noted with tandem walk today.  Overall, making progress towards PT goals; Likely not yet met max benefit from skilled PT, recommend continuing tx to address strength/balance/gait impairments to improve safety during her activities indoors and outdoors at home and to reduce fall risk.  OBJECTIVE IMPAIRMENTS: Abnormal gait, decreased activity tolerance, decreased balance, decreased endurance, decreased strength, and impaired perceived functional ability.   ACTIVITY LIMITATIONS: standing, transfers, and locomotion level  PARTICIPATION LIMITATIONS: cleaning, interpersonal relationship, shopping, community activity, and yard work  PERSONAL FACTORS: Age, Time since  onset of injury/illness/exacerbation, and 1-2 comorbidities: long COVID, orthopedic hx (see above) are also affecting patient's functional outcome.   REHAB POTENTIAL: Good  CLINICAL DECISION MAKING: Evolving/moderate complexity  EVALUATION COMPLEXITY: Moderate  PLAN:  PT FREQUENCY: 2x/week  PT DURATION: 6 weeks  PLANNED INTERVENTIONS: 97110-Therapeutic exercises, 97530-  Therapeutic activity, 97112- Neuromuscular re-education, 97535- Self Care, and [id number]- Manual therapy  PLAN FOR NEXT SESSION: continue LE strengthening and balance exercises- emphasizing uneven surfaces, dynamic, dual tasking activities; VOR; multi stepped or sequenced/stacked complex balance exercises  Lucrecia Sables, PT, DPT, OCS  Kin Penner, PT 07/06/2023, 12:26 PM

## 2023-07-06 NOTE — Addendum Note (Signed)
 Addended by: Victorino Grates D on: 07/06/2023 01:18 PM   Modules accepted: Orders

## 2023-07-08 ENCOUNTER — Encounter

## 2023-07-08 LAB — HEPATIC FUNCTION PANEL
ALT: 20 IU/L (ref 0–32)
AST: 21 IU/L (ref 0–40)
Albumin: 4.5 g/dL (ref 3.9–4.9)
Alkaline Phosphatase: 143 IU/L — ABNORMAL HIGH (ref 44–121)
Bilirubin Total: <0.2 mg/dL (ref 0.0–1.2)
Bilirubin, Direct: 0.09 mg/dL (ref 0.00–0.40)
Total Protein: 6.3 g/dL (ref 6.0–8.5)

## 2023-07-08 LAB — GAMMA GT: GGT: 16 IU/L (ref 0–60)

## 2023-07-08 LAB — TSH: TSH: 0.869 u[IU]/mL (ref 0.450–4.500)

## 2023-07-10 ENCOUNTER — Ambulatory Visit: Payer: Self-pay | Admitting: Internal Medicine

## 2023-07-10 ENCOUNTER — Ambulatory Visit

## 2023-07-10 DIAGNOSIS — R2689 Other abnormalities of gait and mobility: Secondary | ICD-10-CM

## 2023-07-10 NOTE — Therapy (Signed)
 OUTPATIENT PHYSICAL THERAPY NEURO TREATMENT/MEDICARE PROGRESS NOTE/RE-CERT THROUGH 07/22/23   Patient Name: Dave Mannes MRN: [id number] DOB:Dec 15, 1955, 68 y.o., female Today's Date: 07/10/2023  PCP: Dr. Geralyn Knee, MD  REFERRING PROVIDER: same  END OF SESSION:  PT End of Session - 07/10/23 1701     Visit Number 23    Number of Visits 34    Date for PT Re-Evaluation 07/22/23    Authorization Type 2x/week x 6 weeks (PN done on recert on 06/10/23)    Authorization Time Period Medicare, PN needed at visit #29 (done at visit #19)    PT Start Time 1115    PT Stop Time 1200    PT Time Calculation (min) 45 min    Equipment Utilized During Treatment Gait belt    Activity Tolerance Patient tolerated treatment well    Behavior During Therapy [facility] for tasks assessed/performed            Past Medical History:  Diagnosis Date   Allergies    Basal cell carcinoma    Fatigue 08/30/2014   Generalized anxiety disorder 03/21/2011   GERD (gastroesophageal reflux disease)    History of COVID-19 02/22/2019   HLD (hyperlipidemia)    HSV infection 09/20/2014   Hypercholesterolemia 02/08/2015   Hyperglycemia 08/28/2019   Hyperthyroidism    Major depressive disorder    Osteoporosis 05/13/2015   Pyelonephritis 1985   Scalp, arm, and leg tingling 08/28/2019   Tendinosis 09/12/2013   Rotator cuff tendinosis. Mild capsulitis.   Tremor 08/28/2019   Subtle to mild, upper extremities, left worse than right   Past Surgical History:  Procedure Laterality Date   DILATION AND CURETTAGE OF UTERUS     Miscarriage     x1   TONSILLECTOMY  1963   Patient Active Problem List   Diagnosis Date Noted   Head injury 06/02/2023   Balance problem 01/02/2023   Poison ivy dermatitis 09/26/2022   Urinary frequency 09/26/2022   Abnormal liver function tests 08/22/2022   Flank pain 08/22/2022   Cold intolerance 08/11/2022   Concussion 08/11/2022   Diarrhea 03/26/2022   Headache 01/29/2022   Melanoma in  situ (HCC) 06/08/2021   Contact dermatitis and eczema due to plant 05/31/2021   Healthcare maintenance 11/11/2020   Rib pain on right side 09/30/2020   Joint pain 09/19/2020   Chest congestion 06/17/2020   Joint stiffness 06/17/2020   Change in vision 06/17/2020   Unsteady gait 06/17/2020   Moderate episode of recurrent major depressive disorder (HCC) 06/17/2020   Muscle weakness 06/13/2020   Stress 11/14/2019   Breast cancer screening 11/14/2019   Major depressive disorder    Disorder of bursae of shoulder region 09/01/2019   Knee pain 09/01/2019   Low back pain 09/01/2019   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   Tremor 08/28/2019   Hyperglycemia 08/28/2019   History of COVID-19 02/20/2019   Left arm pain 01/09/2019   Neck pain 09/04/2018   Dizziness 12/19/2017   Dyspnea 09/13/2017   Left facial numbness 06/14/2015   Osteoporosis 05/13/2015   Hypercholesterolemia 02/08/2015   Left foot pain 11/04/2014   Atrophic vaginitis 09/20/2014   HSV infection 09/20/2014   Pelvic pain in female 09/20/2014   Fatigue 08/30/2014   Chest pain 08/30/2014   Abdominal pain 08/29/2014   Tendinosis 09/12/2013   Basal cell carcinoma 03/17/2012   Hyperthyroidism 03/17/2012   Fibroids 01/19/2012   BV (bacterial vaginosis) 01/05/2012   PMB (postmenopausal bleeding) 01/05/2012   Generalized anxiety disorder 03/21/2011   GERD (gastroesophageal reflux  disease) 03/21/2011    ONSET DATE: 2021  REFERRING DIAG: R26.89 (ICD-10-CM) - Balance problem   THERAPY DIAG:  Balance problem  Rationale for Evaluation and Treatment: Rehabilitation  SUBJECTIVE: From initial evaluation note 03/02/23                                                                                                                                                                                            SUBJECTIVE STATEMENT: Pt states she is having difficulty with her balance.  She has been noticing it for several months.  It is  progessively worsening.  She feels unsteady on her feet at times while walking and has to lean into the wall or reach for support.  She attributes the balance changes to her long COVID recovery process.  She is not falling to floor.  But when she stands up and starts moving example: walking towards a door and then leans on the door or leans on the wall for balance; sometimes she is weaving when she ambulates.  She notices she veers to one side more than the other.  She has a consult with [facility] in March 2025 for a consult.  She had seen a neurologist earlier in the long COVID recovery process.  She has also seen her cardiologist and r/o cardiac reasons.    She has not been able to exercise a lot since COVID.  She still has sort term memory issues too.  She does walk 1.5 miles- flat neighborhood; she has not done any strength training.  If she overdoes it she will have a flare up for 2 days.    She is not lightheaded, no dizziness noted.  Pt accompanied by: self  PERTINENT HISTORY: Jan 2021- had COVID and long COVID sx; spent 6 months lying bed, had a tough recovery; has had exploratory R ankle arthroscopy, no other LE  or spine injuries or surgeries.  Retired Teacher, early years/pre   PAIN:  Are you having pain? No  PRECAUTIONS: Fall  RED FLAGS: None   WEIGHT BEARING RESTRICTIONS: No  FALLS: Has patient fallen in last 6 months? No, but catches herself with the wall/doorway  LIVING ENVIRONMENT: Lives with: lives with their spouse Lives in: House/apartment Stairs: yes, 3 story home- she is concerned about her balance going down the stairs- does not feel stable and leans to the R when going down the stairs; sometimes has R ankle pain while going down the stairs- has rail on the L but not on the R; has 3 steps to enter Has following equipment at home: None  PLOF: Independent  PATIENT GOALS: to improve balance  OBJECTIVE:  Note: Objective measures were completed at Evaluation unless  otherwise noted. (Portions deferred secondary to time constraints as pt arrived late to appointment)  COGNITION: Overall cognitive status: Within functional limits for tasks assessed   SENSATION: Not tested  COORDINATION: deferred  POSTURE: rounded shoulders  LOWER EXTREMITY ROM:   functionally able to perform sit to stand independently  Active  Right Eval Left Eval  Hip flexion    Hip extension    Hip abduction    Hip adduction    Hip internal rotation    Hip external rotation    Knee flexion    Knee extension    Ankle dorsiflexion    Ankle plantarflexion    Ankle inversion    Ankle eversion     (Blank rows = not tested)  LOWER EXTREMITY MMT:    MMT Right Eval Left Eval  Hip flexion 4 4  Hip extension 4 4  Hip abduction    Hip adduction    Hip internal rotation    Hip external rotation    Knee flexion 4 4  Knee extension 4 4  Ankle dorsiflexion    Ankle plantarflexion    Ankle inversion    Ankle eversion    (Blank rows = not tested)   GAIT:  Will further assess at next visit  FUNCTIONAL TESTS:  5 times sit to stand: 15 seconds SL balance: R x 20 seconds, L x 12 seconds  PATIENT SURVEYS:  FGA to be administered at second visit                                                                                                                              TREATMENT DATE:07/10/23 Subjective: Pt reports no new falls.  Had her nerve conduction study done which showed mild peripheral neuropathy. Had a few episodes of feeling unsteady this week, overall they were not as unsteady as in the past.   Pain: 0/10  Objective:   SLS balance R x 28 seconds, L x 18 seconds (was 20 and 12 at initial evaluation) LE MMT: hip abd R 4-/5, L 4/5 today Activities specific balance scale: 74%  Assessed Gr toe extension R and L at 80 deg extension Symmetrical MTP 2-5 extension AROM b/l L MTP 2-5 flexion AROM 50% limited compared to R and MMT 4/5 L vs 5/5 R for toe  flexion   Neuro re-ed: 30 min Sit on physioball: trunk/head diagonal D1/D2 look at cone with arms outstretched, follow with eyes x 10 ea direction, each diagonal- not today Seated on physioball: alternating marches x 1 min, alternating knee extensions x 1 min Sit to stand from physioball x10- not today 60 lb resisted trunk walk outs forward/back, right, left with turning head R/L- 8x ea scenario- not today Sit to stand with head turn to R or L target on wall x10, 2 sets ea direction- not today Sit to stand with head turn + initiating few  steps to R or L combined x 8, 3 rounds ea direction; then added intervals of weaving figure 8 in/out of cones x 4; then added intervals of stepping over hurdles/various heights and obstacles x4; then added carrying box- not today Amb in hallway head turns R/L 2 laps and up/down 2 laps ea then 2 laps tossing ball R/L follow with eyes Amb in hallway watching ball in her hands- eyes on moving target, 2 laps R/L and 2 laps up/down Standing D2 pattern squat with follow ball with eyes low to high R and L x 15 ea direction Tandem walk 4x20 ft with eyes focused on various targets, not looking at feet  Lateral hurdles outside parallel bars without UE support 4 laps R/L  Not today: SLS: 3x ea LE focusing on keeping eyes on target in front, 20-30 sec intervals in parallel bars Tandem stance, both ways, 3x with 5 lb med ball overhead lift x 5 reps, 2 sets - not today Tandem walk on grass: 3 laps x 20 ft with PT (gait belt and CGA)- significant trunk/hip lateral movement noted, pt uses stepping strategy to regain balance Grape vine stepping R/L in grass: 3 laps x 20 ft with PT (gait belt and supervision) no loss of balance  Blue ladder on floor: alternating high marches forward 3 laps, lateral high knees 2 laps, diagonal each direction 2 laps ea- not today Sit to stand with 360 deg turn, 5 lb medball toss from PT, turn opposite way, pass ball back to PT then sit: 3x CW and  3X CCW, repeated 2 sets Ball toss at trampoline (feet together) on airex: forward, 5 lbs 3x30 seconds- not today Feet together on airex: head turns R/L 30 second intervals x 3, up/down 30 second intervals x3 (PT close supervision)- not today Weave in/out of cones- turning directions, practiced keeping eyes ahead, 4 laps- not today VOR: feet together 3x 30 seconds R/L and up/down standing- HEP  Sit to stand and then amb around corner with surface change (concrete/grass/mulch outdoors)- practiced R and L and 180 deg pivot turn.  Practiced keeping eyes ahead to scan environment instead of looking at feet. 3x ea- not today Amb on grass outdoor surfaces: forward/reverse intervals 3x 25 ft with PT verbal cues for sudden direction changes; then head turns R/L 2 laps, look up/down 2 laps (gait belt, PT supervision)     Therapeutic Activities: Not today  Sit to stand holding 5 lb and walk in small CW or CWW circle around chair then sit down without hands- 8 reps ea direction (Not today) Obstacle course: stepping over/around various heights, squat lift green physioball floor to waist, turn to R or L and put behind back, and 15 wall squats with physioball behind, set ball down, turn to R or L to return over obstacles.  Practiced multiple trials x 10 min, to simulate activities in home like bending to lift something and turning directions to carry it while also integrating LE strengthening with squat  Added carrying box (shoe box in hands) for dual tasking while completing sit to stand with 180 deg turn to walk around a corner to R/L while holding something at home like a functional activity she describes having difficulty with at home and stepping around cones/over obstacles, having to scan environment with eyes for safety (PT supervision with gait belt)   Therapeutic Exercise: 10 min Sit to stand 2x12 holding 10 lb med ball with overhead press Squats 2x10 holding 10 lb med ball Star  lunge: R and L,  focused on anterior/diagonal/lateral then posterior/diagonal/lateral 5 rounds ea, ea LE  Towel curls L foot x 1 min- not today Heel raises x20, 2 sets- not today Toe raises x20, 2 sets- not today Self toe flexion stretch L x 30 seconds, 2 reps- not today Bosu: step ups- RLLR, and LRRL x15 ea  (Not today) Lateral band walk with blue TB at distal thighs, 4 laps x 20 ft Ascend/descend stairs: using single railing, x 4 laps- not today Stepping over obstacles: 4 laps in parallel bars (multi height hurdles)- not today Heel raises: 2x10 5# ankle weights- not today Toe raises: 2x10 5# ankle weights- not today Front step ups on 6 inch step: LRRL, RLLR x10 ea- not today Standing hamstring curl: 5# 2x12 Seated knee extension: 5# 2x12 Deadlifts: 2x9# dumbbells, 2x10- not today  HEP instruction- see below  PATIENT EDUCATION: Education details: PT POC/tx for balance and strength deficits Person educated: Patient Education method: Explanation Education comprehension: verbalized understanding  HOME EXERCISE PROGRAM: Access Code: [email or web address] URL: [email or web address] Date: 03/09/2023 Prepared by: Lucrecia Sables  Exercises - Sit to Stand Without Arm Support  - 1 x daily - 7 x weekly - 4 sets - 5 reps - Tandem Walking with Counter Support  - 1 x daily - 7 x weekly - 4 sets - Single Leg Stance  - 1 x daily - 7 x weekly - 3-5 reps - 15 hold - Seated Gaze Stabilization with Head Nod  - 1 x daily - 7 x weekly - 30 reps - 30 sec hold - Seated Gaze Stabilization with Head Rotation  - 1 x daily - 7 x weekly - 3 reps - 30 sec hold   GOALS: Goals reviewed with patient? Yes  SHORT TERM GOALS: Target date: 03/28/23  Pt will demonstrate ability to perform HEP for LE strengthening and balance exercises with proper technique/form in clinic Baseline: pt is not formally performing strength exercises currently at home; 04/27/23 in progress Goal status: In progress  LONG TERM GOALS: Target  date: 07/22/23  Improve LE strength 1/2 MMT grade to facilitate improved ability to ambulate down her hallway at home and descend/ascend stairs in her home without losing balance Baseline: 4/5; 5/5 hip flexion (not knee extension or flex yet- still 4/5), ascending and descending stairs with improved balance, but hasn't been home to trial this in a few weeks; pt reports she is ascending/descending stairs at home without difficulty, but continues to lean heavily into wall sometimes in her hallway if she is moving quickly and going around a corner from one room to the next Goal status: In progress  2.  Improve FGA score to >23 indicating decreased fall risk during ambulation Baseline: to be administered at 2nd visit; will modifiy this goal accordingly if needed Goal status: In progress  3.  Improve 5x STS time to <15 seconds indicating decreased fall risk in population age >59 y/o Baseline: 15; 04/27/23: 9 seconds best trial, 11 seconds other trial Goal status: MET    4. Improve ABC score >13 points (> 88%) indicating reduced risk for falls   Baseline 06/10/23: 74%   Goal status: new  ASSESSMENT:  CLINICAL IMPRESSION: Pt able to perform lunge in multi directions to practice stepping strategy and also controlling balance outside base of support as well as LE strengthening today.  Demonstrated better control with R LE than L.  Did require PT guarding/supervision for this exercise for safety today.  Overall, making progress towards PT  goals; Likely not yet met max benefit from skilled PT, recommend continuing tx to address strength/balance/gait impairments to improve safety during her activities indoors and outdoors at home and to reduce fall risk.  OBJECTIVE IMPAIRMENTS: Abnormal gait, decreased activity tolerance, decreased balance, decreased endurance, decreased strength, and impaired perceived functional ability.   ACTIVITY LIMITATIONS: standing, transfers, and locomotion level  PARTICIPATION  LIMITATIONS: cleaning, interpersonal relationship, shopping, community activity, and yard work  PERSONAL FACTORS: Age, Time since onset of injury/illness/exacerbation, and 1-2 comorbidities: long COVID, orthopedic hx (see above) are also affecting patient's functional outcome.   REHAB POTENTIAL: Good  CLINICAL DECISION MAKING: Evolving/moderate complexity  EVALUATION COMPLEXITY: Moderate  PLAN:  PT FREQUENCY: 2x/week  PT DURATION: 6 weeks  PLANNED INTERVENTIONS: 97110-Therapeutic exercises, 97530- Therapeutic activity, 97112- Neuromuscular re-education, 97535- Self Care, and [id number]- Manual therapy  PLAN FOR NEXT SESSION: continue LE strengthening and balance exercises- emphasizing uneven surfaces, dynamic, dual tasking activities; VOR; multi stepped or sequenced/stacked complex balance exercises  Lucrecia Sables, PT, DPT, OCS  Kin Penner, PT 07/10/2023, 5:07 PM

## 2023-07-20 ENCOUNTER — Ambulatory Visit

## 2023-07-20 DIAGNOSIS — R2689 Other abnormalities of gait and mobility: Secondary | ICD-10-CM

## 2023-07-20 NOTE — Therapy (Signed)
 OUTPATIENT PHYSICAL THERAPY NEURO TREATMENT/MEDICARE PROGRESS NOTE/RE-CERT THROUGH 07/22/23   Patient Name: Amber Richardson MRN: [id number] DOB:04-08-1955, 68 y.o., female Today's Date: 07/20/2023  PCP: Dr. Glendia, MD  REFERRING PROVIDER: same  END OF SESSION:  PT End of Session - 07/20/23 1710     Visit Number 24    Number of Visits 34    Date for PT Re-Evaluation 07/22/23    Authorization Type 2x/week x 6 weeks (PN done on recert on 06/10/23)    Authorization Time Period Medicare, PN needed at visit #29 (done at visit #19)    PT Start Time 1500    PT Stop Time 1545    PT Time Calculation (min) 45 min    Equipment Utilized During Treatment Gait belt    Activity Tolerance Patient tolerated treatment well    Behavior During Therapy [facility] for tasks assessed/performed             Past Medical History:  Diagnosis Date   Allergies    Basal cell carcinoma    Fatigue 08/30/2014   Generalized anxiety disorder 03/21/2011   GERD (gastroesophageal reflux disease)    History of COVID-19 02/22/2019   HLD (hyperlipidemia)    HSV infection 09/20/2014   Hypercholesterolemia 02/08/2015   Hyperglycemia 08/28/2019   Hyperthyroidism    Major depressive disorder    Osteoporosis 05/13/2015   Pyelonephritis 1985   Scalp, arm, and leg tingling 08/28/2019   Tendinosis 09/12/2013   Rotator cuff tendinosis. Mild capsulitis.   Tremor 08/28/2019   Subtle to mild, upper extremities, left worse than right   Past Surgical History:  Procedure Laterality Date   DILATION AND CURETTAGE OF UTERUS     Miscarriage     x1   TONSILLECTOMY  1963   Patient Active Problem List   Diagnosis Date Noted   Head injury 06/02/2023   Balance problem 01/02/2023   Poison ivy dermatitis 09/26/2022   Urinary frequency 09/26/2022   Abnormal liver function tests 08/22/2022   Flank pain 08/22/2022   Cold intolerance 08/11/2022   Concussion 08/11/2022   Diarrhea 03/26/2022   Headache 01/29/2022   Melanoma  in situ (HCC) 06/08/2021   Contact dermatitis and eczema due to plant 05/31/2021   Healthcare maintenance 11/11/2020   Rib pain on right side 09/30/2020   Joint pain 09/19/2020   Chest congestion 06/17/2020   Joint stiffness 06/17/2020   Change in vision 06/17/2020   Unsteady gait 06/17/2020   Moderate episode of recurrent major depressive disorder (HCC) 06/17/2020   Muscle weakness 06/13/2020   Stress 11/14/2019   Breast cancer screening 11/14/2019   Major depressive disorder    Disorder of bursae of shoulder region 09/01/2019   Knee pain 09/01/2019   Low back pain 09/01/2019   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   Tremor 08/28/2019   Hyperglycemia 08/28/2019   History of COVID-19 02/20/2019   Left arm pain 01/09/2019   Neck pain 09/04/2018   Dizziness 12/19/2017   Dyspnea 09/13/2017   Left facial numbness 06/14/2015   Osteoporosis 05/13/2015   Hypercholesterolemia 02/08/2015   Left foot pain 11/04/2014   Atrophic vaginitis 09/20/2014   HSV infection 09/20/2014   Pelvic pain in female 09/20/2014   Fatigue 08/30/2014   Chest pain 08/30/2014   Abdominal pain 08/29/2014   Tendinosis 09/12/2013   Basal cell carcinoma 03/17/2012   Hyperthyroidism 03/17/2012   Fibroids 01/19/2012   BV (bacterial vaginosis) 01/05/2012   PMB (postmenopausal bleeding) 01/05/2012   Generalized anxiety disorder 03/21/2011   GERD (gastroesophageal  reflux disease) 03/21/2011    ONSET DATE: 2021  REFERRING DIAG: R26.89 (ICD-10-CM) - Balance problem   THERAPY DIAG:  Balance problem  Rationale for Evaluation and Treatment: Rehabilitation  SUBJECTIVE: From initial evaluation note 03/02/23                                                                                                                                                                                            SUBJECTIVE STATEMENT: Pt states she is having difficulty with her balance.  She has been noticing it for several months.  It is  progessively worsening.  She feels unsteady on her feet at times while walking and has to lean into the wall or reach for support.  She attributes the balance changes to her long COVID recovery process.  She is not falling to floor.  But when she stands up and starts moving example: walking towards a door and then leans on the door or leans on the wall for balance; sometimes she is weaving when she ambulates.  She notices she veers to one side more than the other.  She has a consult with [facility] neurology in March 2025 for a consult.  She had seen a neurologist earlier in the long COVID recovery process.  She has also seen her cardiologist and r/o cardiac reasons.    She has not been able to exercise a lot since COVID.  She still has sort term memory issues too.  She does walk 1.5 miles- flat neighborhood; she has not done any strength training.  If she overdoes it she will have a flare up for 2 days.    She is not lightheaded, no dizziness noted.  Pt accompanied by: self  PERTINENT HISTORY: Jan 2021- had COVID and long COVID sx; spent 6 months lying bed, had a tough recovery; has had exploratory R ankle arthroscopy, no other LE  or spine injuries or surgeries.  Retired Teacher, early years/pre   PAIN:  Are you having pain? No  PRECAUTIONS: Fall  RED FLAGS: None   WEIGHT BEARING RESTRICTIONS: No  FALLS: Has patient fallen in last 6 months? No, but catches herself with the wall/doorway  LIVING ENVIRONMENT: Lives with: lives with their spouse Lives in: House/apartment Stairs: yes, 3 story home- she is concerned about her balance going down the stairs- does not feel stable and leans to the R when going down the stairs; sometimes has R ankle pain while going down the stairs- has rail on the L but not on the R; has 3 steps to enter Has following equipment at home: None  PLOF: Independent  PATIENT GOALS: to improve balance  OBJECTIVE:  Note: Objective measures were completed at Evaluation unless  otherwise noted. (Portions deferred secondary to time constraints as pt arrived late to appointment)  COGNITION: Overall cognitive status: Within functional limits for tasks assessed   SENSATION: Not tested  COORDINATION: deferred  POSTURE: rounded shoulders  LOWER EXTREMITY ROM:   functionally able to perform sit to stand independently  Active  Right Eval Left Eval  Hip flexion    Hip extension    Hip abduction    Hip adduction    Hip internal rotation    Hip external rotation    Knee flexion    Knee extension    Ankle dorsiflexion    Ankle plantarflexion    Ankle inversion    Ankle eversion     (Blank rows = not tested)  LOWER EXTREMITY MMT:    MMT Right Eval Left Eval  Hip flexion 4 4  Hip extension 4 4  Hip abduction    Hip adduction    Hip internal rotation    Hip external rotation    Knee flexion 4 4  Knee extension 4 4  Ankle dorsiflexion    Ankle plantarflexion    Ankle inversion    Ankle eversion    (Blank rows = not tested)   GAIT:  Will further assess at next visit  FUNCTIONAL TESTS:  5 times sit to stand: 15 seconds SL balance: R x 20 seconds, L x 12 seconds  PATIENT SURVEYS:  FGA to be administered at second visit                                                                                                                              TREATMENT DATE:07/20/23 Subjective: Pt reports no new falls.  Had her nerve conduction study done which showed mild peripheral neuropathy. Had a few episodes of feeling unsteady this week, overall they were not as unsteady as in the past.  Pt reports she is dealing with a lot of stress in her personal life right now.  Saw neurology for f/u and they recommended she see ENT for a consult and further vestibular eval.  No falls since last time.  Still feeling swimmy headed when she walks around a corner.  Has difficulty with depth perception in a room with solid color walls- feels disoriented.  Pain:  0/10  Objective:   SLS balance R x 28 seconds, L x 18 seconds (was 20 and 12 at initial evaluation) LE MMT: hip abd R 4-/5, L 4/5 today Activities specific balance scale: 74%  Assessed Gr toe extension R and L at 80 deg extension Symmetrical MTP 2-5 extension AROM b/l L MTP 2-5 flexion AROM 50% limited compared to R and MMT 4/5 L vs 5/5 R for toe flexion   Neuro re-ed: 30 min Sit on physioball: trunk/head diagonal D1/D2 look at cone with arms outstretched, follow with eyes x 10 ea direction, each diagonal- not today Seated on physioball:  alternating marches x 1 min, alternating knee extensions x 1 min Sit to stand from physioball x10- not today 60 lb resisted trunk walk outs forward/back, right, left with turning head R/L- 8x ea scenario- not today Sit to stand with head turn to R or L target on wall x10, 2 sets ea direction- not today Sit to stand with head turn + initiating few steps to R or L combined x 8, 3 rounds ea direction; then added intervals of weaving figure 8 in/out of cones x 4; then added intervals of stepping over hurdles/various heights and obstacles x4; then added carrying box- not today Amb in hallway head turns R/L 2 laps and up/down 2 laps ea then 2 laps tossing ball R/L follow with eyes Amb in hallway watching ball in her hands- eyes on moving target, 2 laps R/L and 2 laps up/down Standing D2 pattern squat with follow ball with eyes low to high R and L x 15 ea direction Tandem walk 4x20 ft with eyes focused on various targets, not looking at feet  Lateral hurdles outside parallel bars without UE support 4 laps R/L  Not today: SLS: 3x ea LE focusing on keeping eyes on target in front, 20-30 sec intervals in parallel bars Tandem stance, both ways, 3x with 5 lb med ball overhead lift x 5 reps, 2 sets - not today Tandem walk on grass: 3 laps x 20 ft with PT (gait belt and CGA)- significant trunk/hip lateral movement noted, pt uses stepping strategy to regain  balance Grape vine stepping R/L in grass: 3 laps x 20 ft with PT (gait belt and supervision) no loss of balance  Blue ladder on floor: alternating high marches forward 3 laps, lateral high knees 2 laps, diagonal each direction 2 laps ea- not today Sit to stand with 360 deg turn, 5 lb medball toss from PT, turn opposite way, pass ball back to PT then sit: 3x CW and 3X CCW, repeated 2 sets Ball toss at trampoline (feet together) on airex: forward, 5 lbs 3x30 seconds- not today Feet together on airex: head turns R/L 30 second intervals x 3, up/down 30 second intervals x3 (PT close supervision)- not today Weave in/out of cones- turning directions, practiced keeping eyes ahead, 4 laps- not today VOR: feet together 3x 30 seconds R/L and up/down standing- HEP  Sit to stand and then amb around corner with surface change (concrete/grass/mulch outdoors)- practiced R and L and 180 deg pivot turn.  Practiced keeping eyes ahead to scan environment instead of looking at feet. 3x ea- not today Amb on grass outdoor surfaces: forward/reverse intervals 3x 25 ft with PT verbal cues for sudden direction changes; then head turns R/L 2 laps, look up/down 2 laps (gait belt, PT supervision)     Therapeutic Activities: Not today  Sit to stand holding 5 lb and walk in small CW or CWW circle around chair then sit down without hands- 8 reps ea direction (Not today) Obstacle course: stepping over/around various heights, squat lift green physioball floor to waist, turn to R or L and put behind back, and 15 wall squats with physioball behind, set ball down, turn to R or L to return over obstacles.  Practiced multiple trials x 10 min, to simulate activities in home like bending to lift something and turning directions to carry it while also integrating LE strengthening with squat  Added carrying box (shoe box in hands) for dual tasking while completing sit to stand with 180 deg turn to  walk around a corner to R/L while  holding something at home like a functional activity she describes having difficulty with at home and stepping around cones/over obstacles, having to scan environment with eyes for safety (PT supervision with gait belt)   Therapeutic Exercise: 10 min Sit to stand 2x12 holding 10 lb med ball with overhead press Squats 2x10 holding 10 lb med ball Star lunge: R and L, focused on anterior/diagonal/lateral then posterior/diagonal/lateral 5 rounds ea, ea LE- not today Nustep x 6 min for LE strength level 5  Towel curls L foot x 1 min- not today Heel raises x20, 2 sets- not today Toe raises x20, 2 sets- not today Self toe flexion stretch L x 30 seconds, 2 reps- not today Bosu: step ups- RLLR, and LRRL x15 ea  (Not today) Lateral band walk with blue TB at distal thighs, 4 laps x 20 ft Ascend/descend stairs: using single railing, x 4 laps- not today Stepping over obstacles: 4 laps in parallel bars (multi height hurdles)- not today Heel raises: 2x10 5# ankle weights- not today Toe raises: 2x10 5# ankle weights- not today Front step ups on 6 inch step: LRRL, RLLR x10 ea- not today Standing hamstring curl: 5# 2x12 Seated knee extension: 5# 2x12 Deadlifts: 2x9# dumbbells, 2x10- not today  HEP instruction- see below  PATIENT EDUCATION: Education details: PT POC/tx for balance and strength deficits Person educated: Patient Education method: Explanation Education comprehension: verbalized understanding  HOME EXERCISE PROGRAM: Access Code: [email or web address] URL: [email or web address] Date: 03/09/2023 Prepared by: Vernell Reges  Exercises - Sit to Stand Without Arm Support  - 1 x daily - 7 x weekly - 4 sets - 5 reps - Tandem Walking with Counter Support  - 1 x daily - 7 x weekly - 4 sets - Single Leg Stance  - 1 x daily - 7 x weekly - 3-5 reps - 15 hold - Seated Gaze Stabilization with Head Nod  - 1 x daily - 7 x weekly - 30 reps - 30 sec hold - Seated Gaze Stabilization with Head  Rotation  - 1 x daily - 7 x weekly - 3 reps - 30 sec hold   GOALS: Goals reviewed with patient? Yes  SHORT TERM GOALS: Target date: 03/28/23  Pt will demonstrate ability to perform HEP for LE strengthening and balance exercises with proper technique/form in clinic Baseline: pt is not formally performing strength exercises currently at home; 04/27/23 in progress Goal status: In progress  LONG TERM GOALS: Target date: 07/22/23  Improve LE strength 1/2 MMT grade to facilitate improved ability to ambulate down her hallway at home and descend/ascend stairs in her home without losing balance Baseline: 4/5; 5/5 hip flexion (not knee extension or flex yet- still 4/5), ascending and descending stairs with improved balance, but hasn't been home to trial this in a few weeks; pt reports she is ascending/descending stairs at home without difficulty, but continues to lean heavily into wall sometimes in her hallway if she is moving quickly and going around a corner from one room to the next Goal status: In progress  2.  Improve FGA score to >23 indicating decreased fall risk during ambulation Baseline: to be administered at 2nd visit; will modifiy this goal accordingly if needed Goal status: In progress  3.  Improve 5x STS time to <15 seconds indicating decreased fall risk in population age >56 y/o Baseline: 15; 04/27/23: 9 seconds best trial, 11 seconds other trial Goal status: MET    4. Improve ABC  score >13 points (> 88%) indicating reduced risk for falls   Baseline 06/10/23: 74%   Goal status: new  ASSESSMENT:  CLINICAL IMPRESSION: Pt remains very motivated to participate in PT and also notes that personal life stressors are impacting her overall well being.  Pt expresses lack of social support during current times.  She had no major losses of balance today during session.  Discussed possible consult with Selinda Eck (coworker) who specializes in vestibular PT at next session for further evaluation  of some of her sx which seem to be influencing her balance. Overall, making progress towards PT goals; Likely not yet met max benefit from skilled PT, recommend continuing tx to address strength/balance/gait impairments to improve safety during her activities indoors and outdoors at home and to reduce fall risk.  OBJECTIVE IMPAIRMENTS: Abnormal gait, decreased activity tolerance, decreased balance, decreased endurance, decreased strength, and impaired perceived functional ability.   ACTIVITY LIMITATIONS: standing, transfers, and locomotion level  PARTICIPATION LIMITATIONS: cleaning, interpersonal relationship, shopping, community activity, and yard work  PERSONAL FACTORS: Age, Time since onset of injury/illness/exacerbation, and 1-2 comorbidities: long COVID, orthopedic hx (see above) are also affecting patient's functional outcome.   REHAB POTENTIAL: Good  CLINICAL DECISION MAKING: Evolving/moderate complexity  EVALUATION COMPLEXITY: Moderate  PLAN:  PT FREQUENCY: 2x/week  PT DURATION: 6 weeks  PLANNED INTERVENTIONS: 97110-Therapeutic exercises, 97530- Therapeutic activity, [id number]- Neuromuscular re-education, 97535- Self Care, and [id number]- Manual therapy  PLAN FOR NEXT SESSION: consult with Selinda for vestibular PT  Vernell Reges, PT, DPT, OCS  Vernell FORBES Reges, PT 07/20/2023, 5:10 PM

## 2023-07-22 ENCOUNTER — Ambulatory Visit

## 2023-07-22 DIAGNOSIS — R2689 Other abnormalities of gait and mobility: Secondary | ICD-10-CM | POA: Diagnosis not present

## 2023-07-22 DIAGNOSIS — R2681 Unsteadiness on feet: Secondary | ICD-10-CM

## 2023-07-22 DIAGNOSIS — R42 Dizziness and giddiness: Secondary | ICD-10-CM

## 2023-07-22 NOTE — Therapy (Unsigned)
 OUTPATIENT PHYSICAL THERAPY NEURO TREATMENT   Patient Name: Amber Richardson MRN: [id number] DOB:09-06-1955, 68 y.o., female Today's Date: 07/23/2023  PCP: Dr. Glendia, MD  REFERRING PROVIDER: same  END OF SESSION:  PT End of Session - 07/22/23 1150     Visit Number 25    Number of Visits 34    Date for PT Re-Evaluation 07/22/23    Authorization Type 2x/week x 6 weeks (PN done on recert on 06/10/23)    Authorization Time Period Medicare, PN needed at visit #29 (done at visit #19)    PT Start Time 1150    PT Stop Time 1245    PT Time Calculation (min) 55 min    Equipment Utilized During Treatment Gait belt    Activity Tolerance Patient tolerated treatment well    Behavior During Therapy [facility] for tasks assessed/performed         Past Medical History:  Diagnosis Date   Allergies    Basal cell carcinoma    Fatigue 08/30/2014   Generalized anxiety disorder 03/21/2011   GERD (gastroesophageal reflux disease)    History of COVID-19 02/22/2019   HLD (hyperlipidemia)    HSV infection 09/20/2014   Hypercholesterolemia 02/08/2015   Hyperglycemia 08/28/2019   Hyperthyroidism    Major depressive disorder    Osteoporosis 05/13/2015   Pyelonephritis 1985   Scalp, arm, and leg tingling 08/28/2019   Tendinosis 09/12/2013   Rotator cuff tendinosis. Mild capsulitis.   Tremor 08/28/2019   Subtle to mild, upper extremities, left worse than right   Past Surgical History:  Procedure Laterality Date   DILATION AND CURETTAGE OF UTERUS     Miscarriage     x1   TONSILLECTOMY  1963   Patient Active Problem List   Diagnosis Date Noted   Head injury 06/02/2023   Balance problem 01/02/2023   Poison ivy dermatitis 09/26/2022   Urinary frequency 09/26/2022   Abnormal liver function tests 08/22/2022   Flank pain 08/22/2022   Cold intolerance 08/11/2022   Concussion 08/11/2022   Diarrhea 03/26/2022   Headache 01/29/2022   Melanoma in situ (HCC) 06/08/2021   Contact dermatitis and  eczema due to plant 05/31/2021   Healthcare maintenance 11/11/2020   Rib pain on right side 09/30/2020   Joint pain 09/19/2020   Chest congestion 06/17/2020   Joint stiffness 06/17/2020   Change in vision 06/17/2020   Unsteady gait 06/17/2020   Moderate episode of recurrent major depressive disorder (HCC) 06/17/2020   Muscle weakness 06/13/2020   Stress 11/14/2019   Breast cancer screening 11/14/2019   Major depressive disorder    Disorder of bursae of shoulder region 09/01/2019   Knee pain 09/01/2019   Low back pain 09/01/2019   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   Tremor 08/28/2019   Hyperglycemia 08/28/2019   History of COVID-19 02/20/2019   Left arm pain 01/09/2019   Neck pain 09/04/2018   Dizziness 12/19/2017   Dyspnea 09/13/2017   Left facial numbness 06/14/2015   Osteoporosis 05/13/2015   Hypercholesterolemia 02/08/2015   Left foot pain 11/04/2014   Atrophic vaginitis 09/20/2014   HSV infection 09/20/2014   Pelvic pain in female 09/20/2014   Fatigue 08/30/2014   Chest pain 08/30/2014   Abdominal pain 08/29/2014   Tendinosis 09/12/2013   Basal cell carcinoma 03/17/2012   Hyperthyroidism 03/17/2012   Fibroids 01/19/2012   BV (bacterial vaginosis) 01/05/2012   PMB (postmenopausal bleeding) 01/05/2012   Generalized anxiety disorder 03/21/2011   GERD (gastroesophageal reflux disease) 03/21/2011    ONSET DATE:  2021  REFERRING DIAG: R26.89 (ICD-10-CM) - Balance problem   THERAPY DIAG:  Unsteadiness on feet  Dizziness and giddiness  Rationale for Evaluation and Treatment: Rehabilitation  SUBJECTIVE: From initial evaluation note 03/02/23                                                                                                                                                                                            SUBJECTIVE STATEMENT: Pt states she is having difficulty with her balance.  She has been noticing it for several months.  It is progessively worsening.   She feels unsteady on her feet at times while walking and has to lean into the wall or reach for support.  She attributes the balance changes to her long COVID recovery process.  She is not falling to floor.  But when she stands up and starts moving example: walking towards a door and then leans on the door or leans on the wall for balance; sometimes she is weaving when she ambulates.  She notices she veers to one side more than the other.  She has a consult with [facility] in March 2025 for a consult.  She had seen a neurologist earlier in the long COVID recovery process.  She has also seen her cardiologist and r/o cardiac reasons.    She has not been able to exercise a lot since COVID.  She still has sort term memory issues too.  She does walk 1.5 miles- flat neighborhood; she has not done any strength training.  If she overdoes it she will have a flare up for 2 days.    She is not lightheaded, no dizziness noted.  Pt accompanied by: self  PERTINENT HISTORY: Jan 2021- had COVID and long COVID sx; spent 6 months lying bed, had a tough recovery; has had exploratory R ankle arthroscopy, no other LE  or spine injuries or surgeries.  Retired Teacher, early years/pre   PAIN:  Are you having pain? No  PRECAUTIONS: Fall  RED FLAGS: None   WEIGHT BEARING RESTRICTIONS: No  FALLS: Has patient fallen in last 6 months? No, but catches herself with the wall/doorway  LIVING ENVIRONMENT: Lives with: lives with their spouse Lives in: House/apartment Stairs: yes, 3 story home- she is concerned about her balance going down the stairs- does not feel stable and leans to the R when going down the stairs; sometimes has R ankle pain while going down the stairs- has rail on the L but not on the R; has 3 steps to enter Has following equipment at home: None  PLOF: Independent  PATIENT GOALS: to improve balance  OBJECTIVE:  Note: Objective measures were completed at Evaluation unless otherwise noted. (Portions  deferred secondary to time constraints as pt arrived late to appointment)  COGNITION: Overall cognitive status: Within functional limits for tasks assessed   SENSATION: Not tested  COORDINATION: deferred  POSTURE: rounded shoulders  LOWER EXTREMITY ROM:   functionally able to perform sit to stand independently  Active  Right Eval Left Eval  Hip flexion    Hip extension    Hip abduction    Hip adduction    Hip internal rotation    Hip external rotation    Knee flexion    Knee extension    Ankle dorsiflexion    Ankle plantarflexion    Ankle inversion    Ankle eversion     (Blank rows = not tested)  LOWER EXTREMITY MMT:    MMT Right Eval Left Eval  Hip flexion 4 4  Hip extension 4 4  Hip abduction    Hip adduction    Hip internal rotation    Hip external rotation    Knee flexion 4 4  Knee extension 4 4  Ankle dorsiflexion    Ankle plantarflexion    Ankle inversion    Ankle eversion    (Blank rows = not tested)   GAIT:  Will further assess at next visit  FUNCTIONAL TESTS:  5 times sit to stand: 15 seconds SL balance: R x 20 seconds, L x 12 seconds  PATIENT SURVEYS:  FGA to be administered at second visit                                                                                                                              TREATMENT DATE:07/22/23   Subjective: Pt here for vestibular assessment.   Pain: 0/10   Pertinent History Pt reports imbalance starting November 2024. No known precipitating factors. She denies any true vertigo. She did have a head injury July 2024 when she was struck by a baseball. However she did not have any immediate imbalance issues afterwards. Her symptoms are triggered by turning her head quickly and bending over.   Description of dizziness: off-balance and disorientation (loss of positional sense); Frequency: pretty frequent (pt struggles to give a specific frequency); Duration: seconds Symptom nature:  intermittent, unsteadiness is mostly positional/movement triggerred Progression of symptoms since onset: worse History of similar episodes: No  Provocative Factors: turning head quickly, bending; Easing Factors: Unknown  Auditory complaints (tinnitus, pain, drainage, hearing loss, aural fullness): Yes, wears bilateral hearing aids. Pt feels like the hearing in her L ear might be declining. Denies aural fullness, tinnitus, pain, or drainage; Vision changes (diplopia, visual field loss, recent changes, recent eye exam): No Chest pain/palpitations: Yes, chest pain intermittently for the last 2 years. Per pt, cardiology believes it might be intercostal muscle pain. She complains of intermittent palpitations when laying down. She had a treadmill stress test years ago and reports that it was WNL History of head injury/concussion:  Yes, struck in the head by a baseball July 2024; Stress/anxiety: Yes, mother passed un expectantly and traumatically in March 2025. A lot of stressors related to becoming a caregiver for her brother (cognitive impairment) and caring for her mothers estate. She uses Cymbalta and prn Alprazolam for anxiety. History of disrupted sleep; Migraines/headaches: No Nausea/vomiting: No Numbness/tingling: No Focal weakness: No Dysarthria/dysphagia/drop attacks: Yes, pt report some difficulty swallowing capsules and feeling like they get trapped in her throat. No slurred speed or drop attacks.  Imaging: Yes, Cranial MRI within normal limits, specifically there is no evidence of cerebellar or vermian volume loss    OBJECTIVE EXAMINATION  CRANIAL NERVES Deferred  COORDINATION Finger to Nose: Normal Heel to Shin: Normal Pronator Drift: Negative Rapid Alternating Movements: Normal Finger to Thumb Opposition: Normal   RANGE OF MOTION Cervical Spine AROM WFL and painless in all planes. No functional focal deficits in AROM noted in BUE/BLE  MANUAL MUSCLE TESTING BUE/BLE strength  WNL without focal deficits   OCULOMOTOR / VESTIBULAR TESTING  Oculomotor Exam- Room Light  Findings Comments  Ocular Alignment normal   Ocular ROM normal   Spontaneous Nystagmus normal   Gaze-Holding Nystagmus normal   End-Gaze Nystagmus normal   Vergence (normal 2-3) not examined   Smooth Pursuit normal   Cross-Cover Test not examined   Saccades normal   VOR Cancellation normal   Left Head Impulse abnormal Corrective saccade  Right Head Impulse normal   Static Acuity normal 20/20 (line 8)  Dynamic Acuity normal 20/30 (line 6), pt does take an extended time and struggles to read chart but eventually is able to read all the letters accurately    Oculomotor Exam- Fixation Suppressed  Findings Comments  Ocular Alignment normal   Spontaneous Nystagmus abnormal Very slow R horizontal beating nystagmus  Gaze-Holding Nystagmus abnormal Increase in R horizontal beats but possible R torsional beats noted as well  End-Gaze Nystagmus normal   Head Shaking Nystagmus abnormal Slight increase in R horizontal beating nystagmus  Pressure-Induced Nystagmus not examined   Hyperventilation Induced Nystagmus not examined   Skull Vibration Induced Nystagmus abnormal R horizontal beating nystagmus with vibration on mastoids, more vigorous with vibration on R mastoid   BPPV TESTS:  Symptoms Duration Intensity Nystagmus  L Dix-Hallpike None   None  R Dix-Hallpike None   None  L Head Roll None   None  R Head Roll Oscillopsia   Slow upbeating R torsional  L Sidelying Test      R Sidelying Test      (blank = not tested)   PATIENT EDUCATION: Education details: UVH, vestibular deficits; Person educated: Patient Education method: Explanation Education comprehension: verbalized understanding  HOME EXERCISE PROGRAM: Access Code: [email or web address] URL: [email or web address] Date: 03/09/2023 Prepared by: Vernell Reges  Exercises - Sit to Stand Without Arm Support  - 1 x daily - 7 x  weekly - 4 sets - 5 reps - Tandem Walking with Counter Support  - 1 x daily - 7 x weekly - 4 sets - Single Leg Stance  - 1 x daily - 7 x weekly - 3-5 reps - 15 hold - Seated Gaze Stabilization with Head Nod  - 1 x daily - 7 x weekly - 30 reps - 30 sec hold - Seated Gaze Stabilization with Head Rotation  - 1 x daily - 7 x weekly - 3 reps - 30 sec hold   GOALS: Goals reviewed with patient? Yes  SHORT TERM GOALS: Target date: 03/28/23  Pt will  demonstrate ability to perform HEP for LE strengthening and balance exercises with proper technique/form in clinic Baseline: pt is not formally performing strength exercises currently at home; 04/27/23 in progress Goal status: In progress  LONG TERM GOALS: Target date: 07/22/23  Improve LE strength 1/2 MMT grade to facilitate improved ability to ambulate down her hallway at home and descend/ascend stairs in her home without losing balance Baseline: 4/5; 5/5 hip flexion (not knee extension or flex yet- still 4/5), ascending and descending stairs with improved balance, but hasn't been home to trial this in a few weeks; pt reports she is ascending/descending stairs at home without difficulty, but continues to lean heavily into wall sometimes in her hallway if she is moving quickly and going around a corner from one room to the next Goal status: In progress  2.  Improve FGA score to >23 indicating decreased fall risk during ambulation Baseline: to be administered at 2nd visit; will modifiy this goal accordingly if needed Goal status: In progress  3.  Improve 5x STS time to <15 seconds indicating decreased fall risk in population age >16 y/o Baseline: 15; 04/27/23: 9 seconds best trial, 11 seconds other trial Goal status: MET    4. Improve ABC score >13 points (> 88%) indicating reduced risk for falls   Baseline 06/10/23: 74%   Goal status: new  ASSESSMENT:  CLINICAL IMPRESSION: Performed vestibular assessment on patient today. Findings are suggestive of  possible UVH with slow R horizontal beating nystagmus, more significantly observed with skull vibration. She struggles reading the eye chart during the DVA but is eventually able to read line 6 (static line 8). Dix-Hallpike Test is negative bilaterally for vertigo and nsytagmus however in the R Roll Test Position she has what appears to be some faint upbeating R torsional nystagmus. Will re-test at future session and consider treating pt with CRT. Will also advise primary PT to incorporate some gaze stabilization exercises in her PT interventions. Pt likely has not yet achieved max benefit from skilled PT. Recommend continuing tx to address strength/balance/gait impairments to improve safety during her activities indoors and outdoors at home and to reduce fall risk.  OBJECTIVE IMPAIRMENTS: Abnormal gait, decreased activity tolerance, decreased balance, decreased endurance, decreased strength, and impaired perceived functional ability.   ACTIVITY LIMITATIONS: standing, transfers, and locomotion level  PARTICIPATION LIMITATIONS: cleaning, interpersonal relationship, shopping, community activity, and yard work  PERSONAL FACTORS: Age, Time since onset of injury/illness/exacerbation, and 1-2 comorbidities: long COVID, orthopedic hx (see above) are also affecting patient's functional outcome.   REHAB POTENTIAL: Good  CLINICAL DECISION MAKING: Evolving/moderate complexity  EVALUATION COMPLEXITY: Moderate  PLAN:  PT FREQUENCY: 2x/week  PT DURATION: 6 weeks  PLANNED INTERVENTIONS: [id number]-Therapeutic exercises, 97530- Therapeutic activity, [id number]- Neuromuscular re-education, 97535- Self Care, and [id number]- Manual therapy  PLAN FOR NEXT SESSION: progress balance exercises  Selinda BIRCH Lenox Ladouceur PT, DPT, GCS  Narcissus Detwiler, PT 07/23/2023, 2:39 PM

## 2023-07-25 ENCOUNTER — Other Ambulatory Visit: Payer: Self-pay | Admitting: Internal Medicine

## 2023-07-27 ENCOUNTER — Ambulatory Visit

## 2023-07-27 DIAGNOSIS — R42 Dizziness and giddiness: Secondary | ICD-10-CM

## 2023-07-27 DIAGNOSIS — R2689 Other abnormalities of gait and mobility: Secondary | ICD-10-CM | POA: Diagnosis not present

## 2023-07-27 DIAGNOSIS — R2681 Unsteadiness on feet: Secondary | ICD-10-CM

## 2023-07-27 NOTE — Therapy (Signed)
 OUTPATIENT PHYSICAL THERAPY NEURO TREATMENT/MEDICARE PROGRESS NOTE/RE-CERT THROUGH 09/07/23   Patient Name: Amber Richardson MRN: [id number] DOB:1955/10/04, 68 y.o., female Today's Date: 07/27/2023  PCP: Dr. Glendia, MD  REFERRING PROVIDER: same  END OF SESSION:  PT End of Session - 07/27/23 1204     Visit Number 26    Number of Visits 46    Date for PT Re-Evaluation 09/07/23    Authorization Type 2x/week x 6 weeks (PN done on recert on 07/27/23)    Authorization Time Period Medicare, PN needed at visit #36    PT Start Time 1200    PT Stop Time 1245    PT Time Calculation (min) 45 min    Equipment Utilized During Treatment Gait belt    Activity Tolerance Patient tolerated treatment well    Behavior During Therapy [facility] for tasks assessed/performed             Past Medical History:  Diagnosis Date   Allergies    Basal cell carcinoma    Fatigue 08/30/2014   Generalized anxiety disorder 03/21/2011   GERD (gastroesophageal reflux disease)    History of COVID-19 02/22/2019   HLD (hyperlipidemia)    HSV infection 09/20/2014   Hypercholesterolemia 02/08/2015   Hyperglycemia 08/28/2019   Hyperthyroidism    Major depressive disorder    Osteoporosis 05/13/2015   Pyelonephritis 1985   Scalp, arm, and leg tingling 08/28/2019   Tendinosis 09/12/2013   Rotator cuff tendinosis. Mild capsulitis.   Tremor 08/28/2019   Subtle to mild, upper extremities, left worse than right   Past Surgical History:  Procedure Laterality Date   DILATION AND CURETTAGE OF UTERUS     Miscarriage     x1   TONSILLECTOMY  1963   Patient Active Problem List   Diagnosis Date Noted   Head injury 06/02/2023   Balance problem 01/02/2023   Poison ivy dermatitis 09/26/2022   Urinary frequency 09/26/2022   Abnormal liver function tests 08/22/2022   Flank pain 08/22/2022   Cold intolerance 08/11/2022   Concussion 08/11/2022   Diarrhea 03/26/2022   Headache 01/29/2022   Melanoma in situ (HCC)  06/08/2021   Contact dermatitis and eczema due to plant 05/31/2021   Healthcare maintenance 11/11/2020   Rib pain on right side 09/30/2020   Joint pain 09/19/2020   Chest congestion 06/17/2020   Joint stiffness 06/17/2020   Change in vision 06/17/2020   Unsteady gait 06/17/2020   Moderate episode of recurrent major depressive disorder (HCC) 06/17/2020   Muscle weakness 06/13/2020   Stress 11/14/2019   Breast cancer screening 11/14/2019   Major depressive disorder    Disorder of bursae of shoulder region 09/01/2019   Knee pain 09/01/2019   Low back pain 09/01/2019   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   Tremor 08/28/2019   Hyperglycemia 08/28/2019   History of COVID-19 02/20/2019   Left arm pain 01/09/2019   Neck pain 09/04/2018   Dizziness 12/19/2017   Dyspnea 09/13/2017   Left facial numbness 06/14/2015   Osteoporosis 05/13/2015   Hypercholesterolemia 02/08/2015   Left foot pain 11/04/2014   Atrophic vaginitis 09/20/2014   HSV infection 09/20/2014   Pelvic pain in female 09/20/2014   Fatigue 08/30/2014   Chest pain 08/30/2014   Abdominal pain 08/29/2014   Tendinosis 09/12/2013   Basal cell carcinoma 03/17/2012   Hyperthyroidism 03/17/2012   Fibroids 01/19/2012   BV (bacterial vaginosis) 01/05/2012   PMB (postmenopausal bleeding) 01/05/2012   Generalized anxiety disorder 03/21/2011   GERD (gastroesophageal reflux disease) 03/21/2011  ONSET DATE: 2021  REFERRING DIAG: R26.89 (ICD-10-CM) - Balance problem   THERAPY DIAG:  Unsteadiness on feet  Dizziness and giddiness  Balance problem  Rationale for Evaluation and Treatment: Rehabilitation  SUBJECTIVE: From initial evaluation note 03/02/23                                                                                                                                                                                            SUBJECTIVE STATEMENT: Pt states she is having difficulty with her balance.  She has been noticing  it for several months.  It is progessively worsening.  She feels unsteady on her feet at times while walking and has to lean into the wall or reach for support.  She attributes the balance changes to her long COVID recovery process.  She is not falling to floor.  But when she stands up and starts moving example: walking towards a door and then leans on the door or leans on the wall for balance; sometimes she is weaving when she ambulates.  She notices she veers to one side more than the other.  She has a consult with [facility] in March 2025 for a consult.  She had seen a neurologist earlier in the long COVID recovery process.  She has also seen her cardiologist and r/o cardiac reasons.    She has not been able to exercise a lot since COVID.  She still has sort term memory issues too.  She does walk 1.5 miles- flat neighborhood; she has not done any strength training.  If she overdoes it she will have a flare up for 2 days.    She is not lightheaded, no dizziness noted.  Pt accompanied by: self  PERTINENT HISTORY: Jan 2021- had COVID and long COVID sx; spent 6 months lying bed, had a tough recovery; has had exploratory R ankle arthroscopy, no other LE  or spine injuries or surgeries.  Retired Teacher, early years/pre   PAIN:  Are you having pain? No  PRECAUTIONS: Fall  RED FLAGS: None   WEIGHT BEARING RESTRICTIONS: No  FALLS: Has patient fallen in last 6 months? No, but catches herself with the wall/doorway  LIVING ENVIRONMENT: Lives with: lives with their spouse Lives in: House/apartment Stairs: yes, 3 story home- she is concerned about her balance going down the stairs- does not feel stable and leans to the R when going down the stairs; sometimes has R ankle pain while going down the stairs- has rail on the L but not on the R; has 3 steps to enter Has following equipment at home: None  PLOF: Independent  PATIENT GOALS: to  improve balance  OBJECTIVE:  Note: Objective measures were  completed at Evaluation unless otherwise noted. (Portions deferred secondary to time constraints as pt arrived late to appointment)  COGNITION: Overall cognitive status: Within functional limits for tasks assessed   SENSATION: Not tested  COORDINATION: deferred  POSTURE: rounded shoulders  LOWER EXTREMITY ROM:   functionally able to perform sit to stand independently  Active  Right Eval Left Eval  Hip flexion    Hip extension    Hip abduction    Hip adduction    Hip internal rotation    Hip external rotation    Knee flexion    Knee extension    Ankle dorsiflexion    Ankle plantarflexion    Ankle inversion    Ankle eversion     (Blank rows = not tested)  LOWER EXTREMITY MMT:    MMT Right Eval Left Eval  Hip flexion 4 4  Hip extension 4 4  Hip abduction    Hip adduction    Hip internal rotation    Hip external rotation    Knee flexion 4 4  Knee extension 4 4  Ankle dorsiflexion    Ankle plantarflexion    Ankle inversion    Ankle eversion    (Blank rows = not tested)   GAIT:  Will further assess at next visit  FUNCTIONAL TESTS:  5 times sit to stand: 15 seconds SL balance: R x 20 seconds, L x 12 seconds  PATIENT SURVEYS:  FGA to be administered at second visit                                                                                                                              TREATMENT DATE:07/27/23 Subjective: Pt reports no new falls.  Felt unsteady getting on and off the tractor doing a yardwork project, no falls though.    Pain: 0/10  Objective:   SLS balance R x 28 seconds, L x 18 seconds (was 20 and 12 at initial evaluation) LE MMT: hip abd R 4-/5, L 4/5 today Activities specific balance scale: 81%  Assessed Gr toe extension R and L at 80 deg extension Symmetrical MTP 2-5 extension AROM b/l L MTP 2-5 flexion AROM 50% limited compared to R and MMT 4/5 L vs 5/5 R for toe flexion   Neuro re-ed: 30 min Sit on physioball: trunk/head  diagonal D1/D2 look at cone with arms outstretched, follow with eyes x 10 ea direction, each diagonal- not today Seated on physioball: alternating marches x 1 min, alternating knee extensions x 1 min Sit to stand from physioball x10- not today 60 lb resisted trunk walk outs forward/back, right, left with turning head R/L- 8x ea scenario- not today Sit to stand with head turn to R or L target on wall x10, 2 sets ea direction- not today Sit to stand with head turn + initiating few steps to R or L combined x 8, 3 rounds ea  direction; then added intervals of weaving figure 8 in/out of cones x 4; then added intervals of stepping over hurdles/various heights and obstacles x4; then added carrying box- not today Amb in hallway head turns R/L 2 laps and up/down 2 laps ea then 2 laps tossing ball R/L follow with eyes Amb in hallway watching ball in her hands- eyes on moving target, 2 laps R/L and 2 laps up/down Standing D2 pattern squat with follow ball with eyes low to high R and L x 15 ea direction Tandem walk 4x20 ft with eyes focused on various targets, not looking at feet  Lateral hurdles outside parallel bars without UE support 4 laps R/L- not today Gaze stabilization (horizontal head turns) 20-30 sec intervals, static standing, feet together standing, standing on airex feet together, and forward walk + gaze stabilization dual tasking: 15 min  Not today: SLS: 3x ea LE focusing on keeping eyes on target in front, 20-30 sec intervals in parallel bars Tandem stance, both ways, 3x with 5 lb med ball overhead lift x 5 reps, 2 sets - not today Tandem walk on grass: 3 laps x 20 ft with PT (gait belt and CGA)- significant trunk/hip lateral movement noted, pt uses stepping strategy to regain balance Grape vine stepping R/L in grass: 3 laps x 20 ft with PT (gait belt and supervision) no loss of balance  Blue ladder on floor: alternating high marches forward 3 laps, lateral high knees 2 laps, diagonal each  direction 2 laps ea- not today Sit to stand with 360 deg turn, 5 lb medball toss from PT, turn opposite way, pass ball back to PT then sit: 3x CW and 3X CCW, repeated 2 sets Ball toss at trampoline (feet together) on airex: forward, 5 lbs 3x30 seconds- not today Feet together on airex: head turns R/L 30 second intervals x 3, up/down 30 second intervals x3 (PT close supervision)- not today Weave in/out of cones- turning directions, practiced keeping eyes ahead, 4 laps- not today VOR: feet together 3x 30 seconds R/L and up/down standing- HEP  Sit to stand and then amb around corner with surface change (concrete/grass/mulch outdoors)- practiced R and L and 180 deg pivot turn.  Practiced keeping eyes ahead to scan environment instead of looking at feet. 3x ea- not today Amb on grass outdoor surfaces: forward/reverse intervals 3x 25 ft with PT verbal cues for sudden direction changes; then head turns R/L 2 laps, look up/down 2 laps (gait belt, PT supervision)     Therapeutic Activities: Not today  Sit to stand holding 5 lb and walk in small CW or CWW circle around chair then sit down without hands- 8 reps ea direction (Not today) Obstacle course: stepping over/around various heights, squat lift green physioball floor to waist, turn to R or L and put behind back, and 15 wall squats with physioball behind, set ball down, turn to R or L to return over obstacles.  Practiced multiple trials x 10 min, to simulate activities in home like bending to lift something and turning directions to carry it while also integrating LE strengthening with squat  Added carrying box (shoe box in hands) for dual tasking while completing sit to stand with 180 deg turn to walk around a corner to R/L while holding something at home like a functional activity she describes having difficulty with at home and stepping around cones/over obstacles, having to scan environment with eyes for safety (PT supervision with gait  belt)   Therapeutic Exercise: 10 min  Sit to stand 2x12 holding 10 lb med ball with overhead press Squats 2x10 holding 10 lb med ball Star lunge: R and L, focused on anterior/diagonal/lateral then posterior/diagonal/lateral 5 rounds ea, ea LE- not today Nustep x 6 min for LE strength level 5  Towel curls L foot x 1 min- not today Heel raises x20, 2 sets- not today Toe raises x20, 2 sets- not today Self toe flexion stretch L x 30 seconds, 2 reps- not today Bosu: step ups- RLLR, and LRRL x15 ea  (Not today) Lateral band walk with blue TB at distal thighs, 4 laps x 20 ft Ascend/descend stairs: using single railing, x 4 laps- not today Stepping over obstacles: 4 laps in parallel bars (multi height hurdles)- not today Heel raises: 2x10 5# ankle weights- not today Toe raises: 2x10 5# ankle weights- not today Front step ups on 6 inch step: LRRL, RLLR x10 ea- not today Standing hamstring curl: 5# 2x12 Seated knee extension: 5# 2x12 Deadlifts: 2x9# dumbbells, 2x10- not today  HEP instruction- see below  PATIENT EDUCATION: Education details: PT POC/tx for balance and strength deficits Person educated: Patient Education method: Explanation Education comprehension: verbalized understanding  HOME EXERCISE PROGRAM: Access Code: [email or web address] URL: [email or web address] Date: 03/09/2023 Prepared by: Vernell Reges  Exercises - Sit to Stand Without Arm Support  - 1 x daily - 7 x weekly - 4 sets - 5 reps - Tandem Walking with Counter Support  - 1 x daily - 7 x weekly - 4 sets - Single Leg Stance  - 1 x daily - 7 x weekly - 3-5 reps - 15 hold - Seated Gaze Stabilization with Head Nod  - 1 x daily - 7 x weekly - 30 reps - 30 sec hold - Seated Gaze Stabilization with Head Rotation  - 1 x daily - 7 x weekly - 3 reps - 30 sec hold   GOALS: Goals reviewed with patient? Yes  SHORT TERM GOALS: Target date: 09/07/23  Pt will demonstrate ability to perform HEP for LE strengthening  and balance exercises with proper technique/form in clinic Baseline: pt is not formally performing strength exercises currently at home; 04/27/23 in progress Goal status: In progress  LONG TERM GOALS: Target date: 09/07/23  Improve LE strength 1/2 MMT grade to facilitate improved ability to ambulate down her hallway at home and descend/ascend stairs in her home without losing balance Baseline: 4/5; 5/5 hip flexion (not knee extension or flex yet- still 4/5), ascending and descending stairs with improved balance, but hasn't been home to trial this in a few weeks; pt reports she is ascending/descending stairs at home without difficulty, but continues to lean heavily into wall sometimes in her hallway if she is moving quickly and going around a corner from one room to the next Goal status: In progress  2.  Improve FGA score to >23 indicating decreased fall risk during ambulation Baseline: to be administered at 2nd visit; will modifiy this goal accordingly if needed Goal status: In progress  3.  Improve 5x STS time to <15 seconds indicating decreased fall risk in population age >21 y/o Baseline: 15; 04/27/23: 9 seconds best trial, 11 seconds other trial Goal status: MET    4. Improve ABC score >13 points (> 88%) indicating reduced risk for falls   Baseline 06/10/23: 74%; 6/30:81%   Goal status: In progress  ASSESSMENT:  CLINICAL IMPRESSION: Pt remains very motivated to participate in PT and also notes that personal life stressors are impacting her overall well  being.  Incorporated additional gaze stabilization type exercises into tx today after her vestibular PT consult last time.  Overall, making progress towards PT goals; Likely not yet met max benefit from skilled PT, recommend continuing tx to address strength/balance/gait impairments to improve safety during her activities indoors and outdoors at home and to reduce fall risk.  OBJECTIVE IMPAIRMENTS: Abnormal gait, decreased activity  tolerance, decreased balance, decreased endurance, decreased strength, and impaired perceived functional ability.   ACTIVITY LIMITATIONS: standing, transfers, and locomotion level  PARTICIPATION LIMITATIONS: cleaning, interpersonal relationship, shopping, community activity, and yard work  PERSONAL FACTORS: Age, Time since onset of injury/illness/exacerbation, and 1-2 comorbidities: long COVID, orthopedic hx (see above) are also affecting patient's functional outcome.   REHAB POTENTIAL: Good  CLINICAL DECISION MAKING: Evolving/moderate complexity  EVALUATION COMPLEXITY: Moderate  PLAN:  PT FREQUENCY: 2x/week  PT DURATION: 6 weeks  PLANNED INTERVENTIONS: 97110-Therapeutic exercises, 97530- Therapeutic activity, [id number]- Neuromuscular re-education, 97535- Self Care, and [id number]- Manual therapy  PLAN FOR NEXT SESSION: continue incorporating gaze stabilization exercises into tx  Vernell Reges, PT, DPT, OCS  Amber Richardson, PT 07/27/2023, 6:54 PM

## 2023-07-28 ENCOUNTER — Encounter: Admitting: Internal Medicine

## 2023-07-29 ENCOUNTER — Ambulatory Visit: Attending: Internal Medicine

## 2023-07-29 DIAGNOSIS — R2689 Other abnormalities of gait and mobility: Secondary | ICD-10-CM | POA: Diagnosis present

## 2023-07-29 DIAGNOSIS — R42 Dizziness and giddiness: Secondary | ICD-10-CM | POA: Insufficient documentation

## 2023-07-29 DIAGNOSIS — R2681 Unsteadiness on feet: Secondary | ICD-10-CM | POA: Diagnosis present

## 2023-07-29 NOTE — Therapy (Signed)
 OUTPATIENT PHYSICAL THERAPY NEURO TREATMENT/RE-CERT THROUGH 09/07/23   Patient Name: Amber Richardson MRN: [id number] DOB:15-Jul-1955, 68 y.o., female Today's Date: 07/29/2023  PCP: Dr. Glendia, MD  REFERRING PROVIDER: same  END OF SESSION:  PT End of Session - 07/29/23 1356     Visit Number 27    Number of Visits 46    Date for PT Re-Evaluation 09/07/23    Authorization Type 2x/week x 6 weeks (PN done on recert on 07/27/23)    Authorization Time Period Medicare, PN needed at visit #36    PT Start Time 1205    PT Stop Time 1250    PT Time Calculation (min) 45 min    Equipment Utilized During Treatment Gait belt    Activity Tolerance Patient tolerated treatment well    Behavior During Therapy [facility] for tasks assessed/performed             Past Medical History:  Diagnosis Date   Allergies    Basal cell carcinoma    Fatigue 08/30/2014   Generalized anxiety disorder 03/21/2011   GERD (gastroesophageal reflux disease)    History of COVID-19 02/22/2019   HLD (hyperlipidemia)    HSV infection 09/20/2014   Hypercholesterolemia 02/08/2015   Hyperglycemia 08/28/2019   Hyperthyroidism    Major depressive disorder    Osteoporosis 05/13/2015   Pyelonephritis 1985   Scalp, arm, and leg tingling 08/28/2019   Tendinosis 09/12/2013   Rotator cuff tendinosis. Mild capsulitis.   Tremor 08/28/2019   Subtle to mild, upper extremities, left worse than right   Past Surgical History:  Procedure Laterality Date   DILATION AND CURETTAGE OF UTERUS     Miscarriage     x1   TONSILLECTOMY  1963   Patient Active Problem List   Diagnosis Date Noted   Head injury 06/02/2023   Balance problem 01/02/2023   Poison ivy dermatitis 09/26/2022   Urinary frequency 09/26/2022   Abnormal liver function tests 08/22/2022   Flank pain 08/22/2022   Cold intolerance 08/11/2022   Concussion 08/11/2022   Diarrhea 03/26/2022   Headache 01/29/2022   Melanoma in situ (HCC) 06/08/2021   Contact  dermatitis and eczema due to plant 05/31/2021   Healthcare maintenance 11/11/2020   Rib pain on right side 09/30/2020   Joint pain 09/19/2020   Chest congestion 06/17/2020   Joint stiffness 06/17/2020   Change in vision 06/17/2020   Unsteady gait 06/17/2020   Moderate episode of recurrent major depressive disorder (HCC) 06/17/2020   Muscle weakness 06/13/2020   Stress 11/14/2019   Breast cancer screening 11/14/2019   Major depressive disorder    Disorder of bursae of shoulder region 09/01/2019   Knee pain 09/01/2019   Low back pain 09/01/2019   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   Tremor 08/28/2019   Hyperglycemia 08/28/2019   History of COVID-19 02/20/2019   Left arm pain 01/09/2019   Neck pain 09/04/2018   Dizziness 12/19/2017   Dyspnea 09/13/2017   Left facial numbness 06/14/2015   Osteoporosis 05/13/2015   Hypercholesterolemia 02/08/2015   Left foot pain 11/04/2014   Atrophic vaginitis 09/20/2014   HSV infection 09/20/2014   Pelvic pain in female 09/20/2014   Fatigue 08/30/2014   Chest pain 08/30/2014   Abdominal pain 08/29/2014   Tendinosis 09/12/2013   Basal cell carcinoma 03/17/2012   Hyperthyroidism 03/17/2012   Fibroids 01/19/2012   BV (bacterial vaginosis) 01/05/2012   PMB (postmenopausal bleeding) 01/05/2012   Generalized anxiety disorder 03/21/2011   GERD (gastroesophageal reflux disease) 03/21/2011  ONSET DATE: 2021  REFERRING DIAG: R26.89 (ICD-10-CM) - Balance problem   THERAPY DIAG:  Unsteadiness on feet  Balance problem  Dizziness and giddiness  Rationale for Evaluation and Treatment: Rehabilitation  SUBJECTIVE: From initial evaluation note 03/02/23                                                                                                                                                                                            SUBJECTIVE STATEMENT: Pt states she is having difficulty with her balance.  She has been noticing it for several  months.  It is progessively worsening.  She feels unsteady on her feet at times while walking and has to lean into the wall or reach for support.  She attributes the balance changes to her long COVID recovery process.  She is not falling to floor.  But when she stands up and starts moving example: walking towards a door and then leans on the door or leans on the wall for balance; sometimes she is weaving when she ambulates.  She notices she veers to one side more than the other.  She has a consult with [facility] in March 2025 for a consult.  She had seen a neurologist earlier in the long COVID recovery process.  She has also seen her cardiologist and r/o cardiac reasons.    She has not been able to exercise a lot since COVID.  She still has sort term memory issues too.  She does walk 1.5 miles- flat neighborhood; she has not done any strength training.  If she overdoes it she will have a flare up for 2 days.    She is not lightheaded, no dizziness noted.  Pt accompanied by: self  PERTINENT HISTORY: Jan 2021- had COVID and long COVID sx; spent 6 months lying bed, had a tough recovery; has had exploratory R ankle arthroscopy, no other LE  or spine injuries or surgeries.  Retired Teacher, early years/pre   PAIN:  Are you having pain? No  PRECAUTIONS: Fall  RED FLAGS: None   WEIGHT BEARING RESTRICTIONS: No  FALLS: Has patient fallen in last 6 months? No, but catches herself with the wall/doorway  LIVING ENVIRONMENT: Lives with: lives with their spouse Lives in: House/apartment Stairs: yes, 3 story home- she is concerned about her balance going down the stairs- does not feel stable and leans to the R when going down the stairs; sometimes has R ankle pain while going down the stairs- has rail on the L but not on the R; has 3 steps to enter Has following equipment at home: None  PLOF: Independent  PATIENT GOALS: to  improve balance  OBJECTIVE:  Note: Objective measures were completed at  Evaluation unless otherwise noted. (Portions deferred secondary to time constraints as pt arrived late to appointment)  COGNITION: Overall cognitive status: Within functional limits for tasks assessed   SENSATION: Not tested  COORDINATION: deferred  POSTURE: rounded shoulders  LOWER EXTREMITY ROM:   functionally able to perform sit to stand independently  Active  Right Eval Left Eval  Hip flexion    Hip extension    Hip abduction    Hip adduction    Hip internal rotation    Hip external rotation    Knee flexion    Knee extension    Ankle dorsiflexion    Ankle plantarflexion    Ankle inversion    Ankle eversion     (Blank rows = not tested)  LOWER EXTREMITY MMT:    MMT Right Eval Left Eval  Hip flexion 4 4  Hip extension 4 4  Hip abduction    Hip adduction    Hip internal rotation    Hip external rotation    Knee flexion 4 4  Knee extension 4 4  Ankle dorsiflexion    Ankle plantarflexion    Ankle inversion    Ankle eversion    (Blank rows = not tested)   GAIT:  Will further assess at next visit  FUNCTIONAL TESTS:  5 times sit to stand: 15 seconds SL balance: R x 20 seconds, L x 12 seconds  PATIENT SURVEYS:  FGA to be administered at second visit                                                                                                                              TREATMENT DATE:07/29/23 Subjective: Pt reports no new falls.  Felt unsteady getting on and off the tractor doing a yardwork project, no falls though.   Pain: 0/10  Objective: SLS balance R x 28 seconds, L x 18 seconds (was 20 and 12 at initial evaluation) LE MMT: hip abd R 4-/5, L 4/5 today Activities specific balance scale: 81%  Assessed Gr toe extension R and L at 80 deg extension Symmetrical MTP 2-5 extension AROM b/l L MTP 2-5 flexion AROM 50% limited compared to R and MMT 4/5 L vs 5/5 R for toe flexion   Neuro re-ed: 30 min Sit on physioball: trunk/head diagonal D1/D2  look at cone with arms outstretched, follow with eyes x 10 ea direction, each diagonal- not today Seated on physioball: alternating marches x 1 min, alternating knee extensions x 1 min Sit to stand from physioball x10- not today 60 lb resisted trunk walk outs forward/back, right, left with turning head R/L- 8x ea scenario- not today Sit to stand with head turn to R or L target on wall x10, 2 sets ea direction- not today Sit to stand with head turn + initiating few steps to R or L combined x 8, 3 rounds ea direction; then added  intervals of weaving figure 8 in/out of cones x 4; then added intervals of stepping over hurdles/various heights and obstacles x4; then added carrying box- not today Amb in hallway head turns R/L 2 laps and up/down 2 laps ea then 2 laps tossing ball R/L follow with eyes Amb in hallway watching ball in her hands- eyes on moving horizontal target, 3 laps R/L and 2 laps up/down Standing D2 pattern squat with follow ball with eyes low to high R and L x 15 ea direction Tandem walk 4x20 ft with eyes focused on various targets, not looking at feet - on airex today Lateral hurdles outside parallel bars without UE support 4 laps R/L- not today Gaze stabilization (horizontal head turns) 20-30 sec intervals, static standing, feet together standing, standing on airex feet together, and forward walk + gaze stabilization dual tasking: 15 min  Not today: SLS: 3x ea LE focusing on keeping eyes on target in front, 20-30 sec intervals in parallel bars Tandem stance, both ways, 3x with 5 lb med ball overhead lift x 5 reps, 2 sets - not today Tandem walk on grass: 3 laps x 20 ft with PT (gait belt and CGA)- significant trunk/hip lateral movement noted, pt uses stepping strategy to regain balance Grape vine stepping R/L in grass: 3 laps x 20 ft with PT (gait belt and supervision) no loss of balance  Blue ladder on floor: alternating high marches forward 3 laps, lateral high knees 2 laps, diagonal  each direction 2 laps ea- not today Sit to stand with 360 deg turn, 5 lb medball toss from PT, turn opposite way, pass ball back to PT then sit: 3x CW and 3X CCW, repeated 2 sets Ball toss at trampoline (feet together) on airex: forward, 5 lbs 3x30 seconds- not today Feet together on airex: head turns R/L 30 second intervals x 3, up/down 30 second intervals x3 (PT close supervision)- not today Weave in/out of cones- turning directions, practiced keeping eyes ahead, 4 laps- not today VOR: feet together 3x 30 seconds R/L and up/down standing- HEP  Sit to stand and then amb around corner with surface change (concrete/grass/mulch outdoors)- practiced R and L and 180 deg pivot turn.  Practiced keeping eyes ahead to scan environment instead of looking at feet. 3x ea- not today Amb on grass outdoor surfaces: forward/reverse intervals 3x 25 ft with PT verbal cues for sudden direction changes; then head turns R/L 2 laps, look up/down 2 laps (gait belt, PT supervision)     Therapeutic Activities: Not today  Sit to stand holding 5 lb and walk in small CW or CWW circle around chair then sit down without hands- 8 reps ea direction (Not today) Obstacle course: stepping over/around various heights, squat lift green physioball floor to waist, turn to R or L and put behind back, and 15 wall squats with physioball behind, set ball down, turn to R or L to return over obstacles.  Practiced multiple trials x 10 min, to simulate activities in home like bending to lift something and turning directions to carry it while also integrating LE strengthening with squat  Added carrying box (shoe box in hands) for dual tasking while completing sit to stand with 180 deg turn to walk around a corner to R/L while holding something at home like a functional activity she describes having difficulty with at home and stepping around cones/over obstacles, having to scan environment with eyes for safety (PT supervision with gait  belt)   Therapeutic Exercise: 10  min Sit to stand 2x12 holding 10 lb med ball with overhead press Squats 2x10 holding 10 lb med ball Star lunge: R and L, focused on anterior/diagonal/lateral then posterior/diagonal/lateral 5 rounds ea, ea LE Nustep x 6 min for LE strength level 5- not today Heel raises x20, 2 sets Toe raises x20, 2 sets  Towel curls L foot x 1 min- not today Self toe flexion stretch L x 30 seconds, 2 reps- not today Bosu: step ups- RLLR, and LRRL x15 ea  (Not today) Lateral band walk with blue TB at distal thighs, 4 laps x 20 ft Ascend/descend stairs: using single railing, x 4 laps- not today Stepping over obstacles: 4 laps in parallel bars (multi height hurdles)- not today Heel raises: 2x10 5# ankle weights- not today Toe raises: 2x10 5# ankle weights- not today Front step ups on 6 inch step: LRRL, RLLR x10 ea- not today Standing hamstring curl: 5# 2x12 Seated knee extension: 5# 2x12 Deadlifts: 2x9# dumbbells, 2x10- not today  HEP instruction- see below  PATIENT EDUCATION: Education details: PT POC/tx for balance and strength deficits Person educated: Patient Education method: Explanation Education comprehension: verbalized understanding  HOME EXERCISE PROGRAM: Access Code: [email or web address] URL: [email or web address] Date: 03/09/2023 Prepared by: Vernell Reges  Exercises - Sit to Stand Without Arm Support  - 1 x daily - 7 x weekly - 4 sets - 5 reps - Tandem Walking with Counter Support  - 1 x daily - 7 x weekly - 4 sets - Single Leg Stance  - 1 x daily - 7 x weekly - 3-5 reps - 15 hold - Seated Gaze Stabilization with Head Nod  - 1 x daily - 7 x weekly - 30 reps - 30 sec hold - Seated Gaze Stabilization with Head Rotation  - 1 x daily - 7 x weekly - 3 reps - 30 sec hold   GOALS: Goals reviewed with patient? Yes  SHORT TERM GOALS: Target date: 09/07/23  Pt will demonstrate ability to perform HEP for LE strengthening and balance exercises  with proper technique/form in clinic Baseline: pt is not formally performing strength exercises currently at home; 04/27/23 in progress Goal status: In progress  LONG TERM GOALS: Target date: 09/07/23  Improve LE strength 1/2 MMT grade to facilitate improved ability to ambulate down her hallway at home and descend/ascend stairs in her home without losing balance Baseline: 4/5; 5/5 hip flexion (not knee extension or flex yet- still 4/5), ascending and descending stairs with improved balance, but hasn't been home to trial this in a few weeks; pt reports she is ascending/descending stairs at home without difficulty, but continues to lean heavily into wall sometimes in her hallway if she is moving quickly and going around a corner from one room to the next Goal status: In progress  2.  Improve FGA score to >23 indicating decreased fall risk during ambulation Baseline: to be administered at 2nd visit; will modifiy this goal accordingly if needed Goal status: In progress  3.  Improve 5x STS time to <15 seconds indicating decreased fall risk in population age >20 y/o Baseline: 15; 04/27/23: 9 seconds best trial, 11 seconds other trial Goal status: MET    4. Improve ABC score >13 points (> 88%) indicating reduced risk for falls   Baseline 06/10/23: 74%; 6/30:81%   Goal status: In progress  ASSESSMENT:  CLINICAL IMPRESSION: Pt remains very motivated to participate in PT and also notes that personal life stressors are impacting her overall well being.  Continued  to incorporate additional gaze stabilization type exercises into tx today after her vestibular PT consult last week.  She does note this recreates her sx with horizontal head turns; she notes sx resolve after each rep within a minute.  Overall, making progress towards PT goals; Likely not yet met max benefit from skilled PT, recommend continuing tx to address strength/balance/gait impairments to improve safety during her activities indoors and  outdoors at home and to reduce fall risk.  OBJECTIVE IMPAIRMENTS: Abnormal gait, decreased activity tolerance, decreased balance, decreased endurance, decreased strength, and impaired perceived functional ability.   ACTIVITY LIMITATIONS: standing, transfers, and locomotion level  PARTICIPATION LIMITATIONS: cleaning, interpersonal relationship, shopping, community activity, and yard work  PERSONAL FACTORS: Age, Time since onset of injury/illness/exacerbation, and 1-2 comorbidities: long COVID, orthopedic hx (see above) are also affecting patient's functional outcome.   REHAB POTENTIAL: Good  CLINICAL DECISION MAKING: Evolving/moderate complexity  EVALUATION COMPLEXITY: Moderate  PLAN:  PT FREQUENCY: 2x/week  PT DURATION: 6 weeks  PLANNED INTERVENTIONS: 97110-Therapeutic exercises, 97530- Therapeutic activity, [id number]- Neuromuscular re-education, 97535- Self Care, and [id number]- Manual therapy  PLAN FOR NEXT SESSION: continue incorporating gaze stabilization exercises into tx  Vernell Reges, PT, DPT, OCS  Vernell FORBES Reges, PT 07/29/2023, 2:12 PM

## 2023-07-29 NOTE — Addendum Note (Signed)
 Addended by: Lexany Belknap E on: 07/29/2023 02:12 PM   Modules accepted: Orders

## 2023-08-07 ENCOUNTER — Ambulatory Visit: Admitting: Internal Medicine

## 2023-08-07 VITALS — BP 136/78 | HR 60 | Temp 98.0°F | Resp 16 | Ht 67.0 in | Wt 168.2 lb

## 2023-08-07 DIAGNOSIS — K219 Gastro-esophageal reflux disease without esophagitis: Secondary | ICD-10-CM

## 2023-08-07 DIAGNOSIS — R7989 Other specified abnormal findings of blood chemistry: Secondary | ICD-10-CM

## 2023-08-07 DIAGNOSIS — E7800 Pure hypercholesterolemia, unspecified: Secondary | ICD-10-CM

## 2023-08-07 DIAGNOSIS — E0590 Thyrotoxicosis, unspecified without thyrotoxic crisis or storm: Secondary | ICD-10-CM

## 2023-08-07 DIAGNOSIS — F331 Major depressive disorder, recurrent, moderate: Secondary | ICD-10-CM

## 2023-08-07 DIAGNOSIS — Z0000 Encounter for general adult medical examination without abnormal findings: Secondary | ICD-10-CM

## 2023-08-07 DIAGNOSIS — R739 Hyperglycemia, unspecified: Secondary | ICD-10-CM

## 2023-08-07 DIAGNOSIS — F439 Reaction to severe stress, unspecified: Secondary | ICD-10-CM

## 2023-08-07 MED ORDER — PANTOPRAZOLE SODIUM 40 MG PO TBEC: 40.0000 mg | DELAYED_RELEASE_TABLET | Freq: Every day | ORAL | 1 refills | Status: DC

## 2023-08-07 NOTE — Assessment & Plan Note (Signed)
 Physical today 08/07/23. PAP 03/15/20 - negative. satisfactory for evaluation.  Endocervical component is absent. HPV negative. Colonoscopy 12/22/22 - diverticulosis, non bleeding internal hemorrhoids.  Recommended f/u colonoscopy in 10 years.  Mammogram 03/27/22 - Birads I. She is planning to schedule.

## 2023-08-07 NOTE — Progress Notes (Signed)
 Subjective:    Patient ID: Amber Richardson, female    DOB: 05-Feb-1955, 68 y.o.   MRN: [id number]  Patient here for  Chief Complaint  Patient presents with   Annual Exam    HPI Here for a physical exam.  Saw neurology 05/13/23 - for evaluation of unsteady gait. Recommended MRI brain, MRI cervical spine and EMG/NCS. Recommended continuing PT and consideration of ENT evaluation or vestibular rehab if no neurological causes identified. MRI brain 05/25/23 - normal. MRI c-spine - Multilevel degenerative changes without significant spinal canal stenosis.  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 Was also evaluated by ortho 04/23/23 - subacromial impingement (left) - recommended PT. Saw GI 11/07/22 - recommended EGD and colonoscopy. Colonoscopy 12/22/22 - non bleeding internal hemorrhoids and diverticulosis. EGD - gastritis - s/p dilation. Recommended f/u colonoscopy in 10 years. Has been going to PT. NCS 07/08/23 -mildly abnormal with evidence of a mild sensorimotor polyneuropathy. Had f/u with neurology - 07/20/23 - referred to ENT. Recommended CTA head and neck. Scheduled for 08/2023. Increased stress. 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 Increased family stress. Seeing psychiatry. Breathing overall appears to be stable. No bowel change reported.    Past Medical History:  Diagnosis Date   Allergies    Basal cell carcinoma    Fatigue 08/30/2014   Generalized anxiety disorder 03/21/2011   GERD (gastroesophageal reflux disease)    History of COVID-19 02/22/2019   HLD (hyperlipidemia)    HSV infection 09/20/2014   Hypercholesterolemia 02/08/2015   Hyperglycemia 08/28/2019   Hyperthyroidism    Major depressive disorder    Osteoporosis 05/13/2015   Pyelonephritis 1985   Scalp, arm, and leg tingling 08/28/2019   Tendinosis 09/12/2013   Rotator cuff tendinosis. Mild capsulitis.   Tremor 08/28/2019   Subtle to mild,  upper extremities, left worse than right   Past Surgical History:  Procedure Laterality Date   DILATION AND CURETTAGE OF UTERUS     Miscarriage     x1   TONSILLECTOMY  1963   Family History  Problem Relation Age of Onset   Hypertension Mother    Hypercholesterolemia Mother    Heart disease Father    Hyperlipidemia Father    Hypercholesterolemia Father    Stroke Father    Dementia Father        Vascular dementia related to stroke history   Social History   Socioeconomic History   Marital status: Married    Spouse name: Not on file   Number of children: 0   Years of education: 16   Highest education level: Bachelor's degree (e.g., BA, AB, BS)  Occupational History   Not on file  Tobacco Use   Smoking status: Never   Smokeless tobacco: Never  Vaping Use   Vaping status: Never Used  Substance and Sexual Activity   Alcohol use: No    Alcohol/week: 0.0 standard drinks of alcohol   Drug use: No   Sexual activity: Not on file  Other Topics Concern   Not on file  Social History Narrative   Right handed   Drinks one cup coffee am and one pepsi   Social Drivers of Health   Financial Resource Strain: Low Risk  (08/03/2023)   Overall Financial Resource Strain (CARDIA)    Difficulty of Paying Living Expenses: Not hard at all  Food Insecurity: No Food Insecurity (08/03/2023)   Hunger Vital Sign    Worried About Running  Out of Food in the Last Year: Never true    Ran Out of Food in the Last Year: Never true  Transportation Needs: No Transportation Needs (08/03/2023)   PRAPARE - Administrator, Civil Service (Medical): No    Lack of Transportation (Non-Medical): No  Physical Activity: Insufficiently Active (08/03/2023)   Exercise Vital Sign    Days of Exercise per Week: 3 days    Minutes of Exercise per Session: 30 min  Stress: No Stress Concern Present (08/03/2023)   [employer] of Occupational Health - Occupational Stress Questionnaire    Feeling of Stress:  Not at all  Social Connections: Moderately Isolated (08/03/2023)   Social Connection and Isolation Panel    Frequency of Communication with Friends and Family: Three times a week    Frequency of Social Gatherings with Friends and Family: Never    Attends Religious Services: Never    Database administrator or Organizations: No    Attends Engineer, structural: Not on file    Marital Status: Married     Review of Systems  Constitutional:  Negative for appetite change and unexpected weight change.  HENT:  Negative for congestion, sinus pressure and sore throat.   Eyes:  Negative for pain and visual disturbance.  Respiratory:  Negative for chest tightness.        Breathing stable. No increased cough reported.   Cardiovascular:  Negative for chest pain, palpitations and leg swelling.  Gastrointestinal:  Negative for abdominal pain, diarrhea, nausea and vomiting.  Genitourinary:  Negative for difficulty urinating and dysuria.  Musculoskeletal:  Negative for joint swelling and myalgias.  Skin:  Negative for color change and rash.  Neurological:  Negative for dizziness and headaches.  Hematological:  Negative for adenopathy. Does not bruise/bleed easily.  Psychiatric/Behavioral:  Negative for agitation.        Increased stress as outlined.        Objective:     BP 136/78   Pulse 60   Temp 98 F (36.7 C)   Resp 16   Ht 5' 7 (1.702 m)   Wt 168 lb 3.2 oz (76.3 kg)   LMP 01/13/2008   SpO2 98%   BMI 26.34 kg/m  Wt Readings from Last 3 Encounters:  08/07/23 168 lb 3.2 oz (76.3 kg)  06/02/23 165 lb (74.8 kg)  01/02/23 166 lb (75.3 kg)    Physical Exam Vitals reviewed.  Constitutional:      General: She is not in acute distress.    Appearance: Normal appearance.  HENT:     Head: Normocephalic and atraumatic.     Right Ear: External ear normal.     Left Ear: External ear normal.     Mouth/Throat:     Pharynx: No oropharyngeal exudate or posterior oropharyngeal  erythema.  Eyes:     General: No scleral icterus.       Right eye: No discharge.        Left eye: No discharge.     Conjunctiva/sclera: Conjunctivae normal.  Neck:     Thyroid : No thyromegaly.  Cardiovascular:     Rate and Rhythm: Normal rate and regular rhythm.  Pulmonary:     Effort: No respiratory distress.     Breath sounds: Normal breath sounds. No wheezing.  Abdominal:     General: Bowel sounds are normal.     Palpations: Abdomen is soft.     Tenderness: There is no abdominal tenderness.  Musculoskeletal:  General: No swelling or tenderness.     Cervical back: Neck supple. No tenderness.  Lymphadenopathy:     Cervical: No cervical adenopathy.  Skin:    Findings: No erythema or rash.  Neurological:     Mental Status: She is alert.  Psychiatric:        Mood and Affect: Mood normal.        Behavior: Behavior normal.         Outpatient Encounter Medications as of 08/07/2023  Medication Sig   albuterol  (VENTOLIN  HFA) 108 (90 Base) MCG/ACT inhaler TAKE 2 PUFFS BY MOUTH EVERY 6 HOURS AS NEEDED FOR WHEEZE OR SHORTNESS OF BREATH   ALPRAZolam (XANAX) 0.5 MG tablet Take by mouth.   Calcium  Citrate-Vitamin D  (CITRACAL + D PO) Take by mouth. Take one in the morning & 2 in the afternoon   diclofenac (VOLTAREN) 50 MG EC tablet Take 50 mg by mouth 2 (two) times daily.   DULoxetine (CYMBALTA) 20 MG capsule Take 20 mg by mouth daily.   DULoxetine (CYMBALTA) 60 MG capsule Take 60 mg by mouth daily.   Evolocumab  (REPATHA  SURECLICK) 140 MG/ML SOAJ INJECT 140 MG INTO THE SKIN EVERY 14 (FOURTEEN) DAYS.   fluticasone  (FLONASE ) 50 MCG/ACT nasal spray SPRAY 2 SPRAYS INTO EACH NOSTRIL EVERY DAY   gabapentin (NEURONTIN) 300 MG capsule Take 300 mg by mouth 3 (three) times daily.   ipratropium (ATROVENT ) 0.03 % nasal spray PLACE 2 SPRAYS INTO BOTH NOSTRILS EVERY 12 (TWELVE) HOURS.   levothyroxine  (SYNTHROID ) 75 MCG tablet Take 1 tablet (75 mcg total) by mouth daily.   Multiple Vitamin  (MULTIVITAMIN) tablet Take 1 tablet by mouth daily.   pantoprazole  (PROTONIX ) 40 MG tablet Take 1 tablet (40 mg total) by mouth daily.   traZODone (DESYREL) 50 MG tablet 50 mg. Takes 1/2 tab po prn   triamcinolone  cream (KENALOG ) 0.1 % Apply 1 application. topically 2 (two) times daily. Until rash resolves   valACYclovir (VALTREX) 500 MG tablet Take one to two tablets by mouth daily   vitamin B-12 (CYANOCOBALAMIN) 1000 MCG tablet Take 1 tablet (1,000 mcg total) by mouth daily.   [DISCONTINUED] pantoprazole  (PROTONIX ) 40 MG tablet Take 1 tablet (40 mg total) by mouth daily.   No facility-administered encounter medications on file as of 08/07/2023.     Lab Results  Component Value Date   WBC 4.8 06/02/2023   HGB 13.0 06/02/2023   HCT 39.9 06/02/2023   PLT 260.0 06/02/2023   GLUCOSE 91 06/02/2023   CHOL 219 (H) 06/02/2023   TRIG 154.0 (H) 06/02/2023   HDL 65.70 06/02/2023   LDLDIRECT 213.0 05/27/2022   LDLCALC 123 (H) 06/02/2023   ALT 20 07/06/2023   AST 21 07/06/2023   NA 139 06/02/2023   K 4.3 06/02/2023   CL 103 06/02/2023   CREATININE 0.84 06/02/2023   BUN 22 06/02/2023   CO2 29 06/02/2023   TSH 0.869 07/06/2023   HGBA1C 5.7 06/02/2023    CT HEAD WO CONTRAST ( ) Result Date: 06/02/2023 CLINICAL DATA:  Clemens with trauma to the head yesterday. EXAM: CT HEAD WITHOUT CONTRAST TECHNIQUE: Contiguous axial images were obtained from the base of the skull through the vertex without intravenous contrast. RADIATION DOSE REDUCTION: This exam was performed according to the departmental dose-optimization program which includes automated exposure control, adjustment of the mA and/or kV according to patient size and/or use of iterative reconstruction technique. COMPARISON:  06/20/2020 FINDINGS: Brain: The brain shows a normal appearance without evidence of malformation, atrophy,  old or acute small or large vessel infarction, mass lesion, hemorrhage, hydrocephalus or extra-axial collection.  Vascular: No hyperdense vessel. No evidence of atherosclerotic calcification. Skull: Normal.  No traumatic finding.  No focal bone lesion. Sinuses/Orbits: Sinuses are clear. Orbits appear normal. Mastoids are clear. Other: None significant IMPRESSION: Normal head CT. Electronically Signed   By: Oneil Officer M.D.   On: 06/02/2023 13:08       Assessment & Plan:  Routine general medical examination at a health care facility  Healthcare maintenance Assessment & Plan: Physical today 08/07/23. PAP 03/15/20 - negative. satisfactory for evaluation.  Endocervical component is absent. HPV negative. Colonoscopy 12/22/22 - diverticulosis, non bleeding internal hemorrhoids.  Recommended f/u colonoscopy in 10 years.  Mammogram 03/27/22 - Birads I. She is planning to schedule.    Hypercholesterolemia Assessment & Plan: Low cholesterol diet and exercise as tolerated.  Off crestor  due to intolerance.  On repatha . Follow lipid panel.   Orders: -     Hepatic function panel; Future -     Basic metabolic panel with GFR; Future -     Lipid panel; Future  Hyperglycemia Assessment & Plan: Low carb diet and exercise. Follow met b and A1c.   Orders: -     Basic metabolic panel with GFR; Future -     Hemoglobin A1c; Future  Hyperthyroidism Assessment & Plan: Off tapazole. On synthroid . Follow tsh.   Orders: -     TSH; Future  Stress Assessment & Plan: Increased stress. Discussed. Seeing psychiatrist. Continues on cymbalta and trazodone - per psych. Follow. Discussed therapy.    Moderate episode of recurrent major depressive disorder [facility]) Assessment & Plan: Followed by psychiatry.  Continues on cymbalta. Follow. Increased stress recently as outlined. Seeing psychiatry. Discussed therapy.    Gastroesophageal reflux disease, unspecified whether esophagitis present Assessment & Plan: EGD 12/22/22 - pathology - Barretts - negative dysplasia.  (Gastritis noted on EGD).  No upper symptoms reported today.     Abnormal liver function tests Assessment & Plan: Elevated alkaline phos. Follow liver panel.  Check GGT if persistent elevation.    Other orders -     Pantoprazole  Sodium; Take 1 tablet (40 mg total) by mouth daily.  Dispense: 90 tablet; Refill: 1     Allena Hamilton, MD

## 2023-08-12 ENCOUNTER — Ambulatory Visit

## 2023-08-14 ENCOUNTER — Encounter

## 2023-08-16 ENCOUNTER — Encounter: Payer: Self-pay | Admitting: Internal Medicine

## 2023-08-16 NOTE — Assessment & Plan Note (Signed)
 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  (Gastritis noted on EGD).  No upper symptoms reported today.

## 2023-08-16 NOTE — Assessment & Plan Note (Signed)
 Elevated alkaline phos. Follow liver panel.  Check GGT if persistent elevation.

## 2023-08-16 NOTE — Assessment & Plan Note (Signed)
 Followed by psychiatry.  Continues on cymbalta. Follow. Increased stress recently as outlined. Seeing psychiatry. Discussed therapy.

## 2023-08-16 NOTE — Assessment & Plan Note (Addendum)
 Increased stress. Discussed. Seeing psychiatrist. Continues on cymbalta and trazodone - per psych. Follow. Discussed therapy.

## 2023-08-16 NOTE — Assessment & Plan Note (Signed)
Off tapazole.  On synthroid.  Follow tsh.  °

## 2023-08-16 NOTE — Assessment & Plan Note (Signed)
 Low-carb diet and exercise.  Follow met b and A1c.

## 2023-08-16 NOTE — Assessment & Plan Note (Signed)
 Low cholesterol diet and exercise as tolerated.  Off crestor due to intolerance.  On repatha. Follow lipid panel.

## 2023-08-17 ENCOUNTER — Ambulatory Visit

## 2023-08-17 DIAGNOSIS — R2689 Other abnormalities of gait and mobility: Secondary | ICD-10-CM

## 2023-08-17 DIAGNOSIS — R2681 Unsteadiness on feet: Secondary | ICD-10-CM | POA: Diagnosis not present

## 2023-08-17 DIAGNOSIS — R42 Dizziness and giddiness: Secondary | ICD-10-CM

## 2023-08-17 NOTE — Therapy (Signed)
 OUTPATIENT PHYSICAL THERAPY NEURO TREATMENT/RE-CERT THROUGH 09/07/23   Patient Name: Amber Richardson MRN: [id number] DOB:1955/06/01, 68 y.o., female Today's Date: 08/17/2023  PCP: Dr. Glendia, MD  REFERRING PROVIDER: same  END OF SESSION:  PT End of Session - 08/17/23 1725     Visit Number 28    Number of Visits 46    Date for PT Re-Evaluation 09/07/23    Authorization Type 2x/week x 6 weeks (PN done on recert on 07/27/23)    Authorization Time Period Medicare, PN needed at visit #36    Equipment Utilized During Treatment Gait belt    Activity Tolerance Patient tolerated treatment well    Behavior During Therapy [facility] for tasks assessed/performed              Past Medical History:  Diagnosis Date   Allergies    Basal cell carcinoma    Fatigue 08/30/2014   Generalized anxiety disorder 03/21/2011   GERD (gastroesophageal reflux disease)    History of COVID-19 02/22/2019   HLD (hyperlipidemia)    HSV infection 09/20/2014   Hypercholesterolemia 02/08/2015   Hyperglycemia 08/28/2019   Hyperthyroidism    Major depressive disorder    Osteoporosis 05/13/2015   Pyelonephritis 1985   Scalp, arm, and leg tingling 08/28/2019   Tendinosis 09/12/2013   Rotator cuff tendinosis. Mild capsulitis.   Tremor 08/28/2019   Subtle to mild, upper extremities, left worse than right   Past Surgical History:  Procedure Laterality Date   DILATION AND CURETTAGE OF UTERUS     Miscarriage     x1   TONSILLECTOMY  1963   Patient Active Problem List   Diagnosis Date Noted   Head injury 06/02/2023   Balance problem 01/02/2023   Poison ivy dermatitis 09/26/2022   Urinary frequency 09/26/2022   Abnormal liver function tests 08/22/2022   Flank pain 08/22/2022   Cold intolerance 08/11/2022   Concussion 08/11/2022   Diarrhea 03/26/2022   Headache 01/29/2022   Melanoma in situ (HCC) 06/08/2021   Contact dermatitis and eczema due to plant 05/31/2021   Healthcare maintenance 11/11/2020    Rib pain on right side 09/30/2020   Joint pain 09/19/2020   Chest congestion 06/17/2020   Joint stiffness 06/17/2020   Change in vision 06/17/2020   Unsteady gait 06/17/2020   Moderate episode of recurrent major depressive disorder (HCC) 06/17/2020   Muscle weakness 06/13/2020   Stress 11/14/2019   Breast cancer screening 11/14/2019   Major depressive disorder    Disorder of bursae of shoulder region 09/01/2019   Knee pain 09/01/2019   Low back pain 09/01/2019   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   Tremor 08/28/2019   Hyperglycemia 08/28/2019   History of COVID-19 02/20/2019   Left arm pain 01/09/2019   Neck pain 09/04/2018   Dizziness 12/19/2017   Dyspnea 09/13/2017   Left facial numbness 06/14/2015   Osteoporosis 05/13/2015   Hypercholesterolemia 02/08/2015   Left foot pain 11/04/2014   Atrophic vaginitis 09/20/2014   HSV infection 09/20/2014   Pelvic pain in female 09/20/2014   Fatigue 08/30/2014   Chest pain 08/30/2014   Abdominal pain 08/29/2014   Tendinosis 09/12/2013   Basal cell carcinoma 03/17/2012   Hyperthyroidism 03/17/2012   Fibroids 01/19/2012   BV (bacterial vaginosis) 01/05/2012   PMB (postmenopausal bleeding) 01/05/2012   Generalized anxiety disorder 03/21/2011   GERD (gastroesophageal reflux disease) 03/21/2011    ONSET DATE: 2021  REFERRING DIAG: R26.89 (ICD-10-CM) - Balance problem   THERAPY DIAG:  Unsteadiness on feet  Balance problem  Dizziness and giddiness  Rationale for Evaluation and Treatment: Rehabilitation  SUBJECTIVE: From initial evaluation note 03/02/23                                                                                                                                                                                            SUBJECTIVE STATEMENT: Pt states she is having difficulty with her balance.  She has been noticing it for several months.  It is progessively worsening.  She feels unsteady on her feet at times while  walking and has to lean into the wall or reach for support.  She attributes the balance changes to her long COVID recovery process.  She is not falling to floor.  But when she stands up and starts moving example: walking towards a door and then leans on the door or leans on the wall for balance; sometimes she is weaving when she ambulates.  She notices she veers to one side more than the other.  She has a consult with [facility] in March 2025 for a consult.  She had seen a neurologist earlier in the long COVID recovery process.  She has also seen her cardiologist and r/o cardiac reasons.    She has not been able to exercise a lot since COVID.  She still has sort term memory issues too.  She does walk 1.5 miles- flat neighborhood; she has not done any strength training.  If she overdoes it she will have a flare up for 2 days.    She is not lightheaded, no dizziness noted.  Pt accompanied by: self  PERTINENT HISTORY: Jan 2021- had COVID and long COVID sx; spent 6 months lying bed, had a tough recovery; has had exploratory R ankle arthroscopy, no other LE  or spine injuries or surgeries.  Retired Teacher, early years/pre   PAIN:  Are you having pain? No  PRECAUTIONS: Fall  RED FLAGS: None   WEIGHT BEARING RESTRICTIONS: No  FALLS: Has patient fallen in last 6 months? No, but catches herself with the wall/doorway  LIVING ENVIRONMENT: Lives with: lives with their spouse Lives in: House/apartment Stairs: yes, 3 story home- she is concerned about her balance going down the stairs- does not feel stable and leans to the R when going down the stairs; sometimes has R ankle pain while going down the stairs- has rail on the L but not on the R; has 3 steps to enter Has following equipment at home: None  PLOF: Independent  PATIENT GOALS: to improve balance  OBJECTIVE:  Note: Objective measures were completed at Evaluation unless otherwise noted. (Portions deferred secondary to time constraints as pt  arrived late to appointment)  COGNITION: Overall cognitive status: Within functional limits for tasks assessed   SENSATION: Not tested  COORDINATION: deferred  POSTURE: rounded shoulders  LOWER EXTREMITY ROM:   functionally able to perform sit to stand independently  Active  Right Eval Left Eval  Hip flexion    Hip extension    Hip abduction    Hip adduction    Hip internal rotation    Hip external rotation    Knee flexion    Knee extension    Ankle dorsiflexion    Ankle plantarflexion    Ankle inversion    Ankle eversion     (Blank rows = not tested)  LOWER EXTREMITY MMT:    MMT Right Eval Left Eval  Hip flexion 4 4  Hip extension 4 4  Hip abduction    Hip adduction    Hip internal rotation    Hip external rotation    Knee flexion 4 4  Knee extension 4 4  Ankle dorsiflexion    Ankle plantarflexion    Ankle inversion    Ankle eversion    (Blank rows = not tested)   GAIT:  Will further assess at next visit  FUNCTIONAL TESTS:  5 times sit to stand: 15 seconds SL balance: R x 20 seconds, L x 12 seconds  PATIENT SURVEYS:  FGA to be administered at second visit                                                                                                                              TREATMENT DATE:08/17/23 Subjective: pt continues to experience multiple life stressors; husband just had surgery this week for ruptured Achilles and she has been doing more of the household management; had a fall- going in 3 steps into her house, tripped on threshold of door when she was rushing to get inside while holding some clothes in her arms- no injury.  Leaned heavy into door jam this week while walking around the doorway quickly.  Also started a new medication for anxiety and she feels like it was making her less steady on her feet.    Pain: 0/10  Objective: SLS balance R x 28 seconds, L x 18 seconds (was 20 and 12 at initial evaluation) LE MMT: hip abd R 4-/5, L  4/5 today Activities specific balance scale: 81%  Assessed Gr toe extension R and L at 80 deg extension Symmetrical MTP 2-5 extension AROM b/l L MTP 2-5 flexion AROM 50% limited compared to R and MMT 4/5 L vs 5/5 R for toe flexion   Neuro re-ed: 30 min Sit on physioball: trunk/head diagonal D1/D2 look at cone with arms outstretched, follow with eyes x 10 ea direction, each diagonal- not today Seated on physioball: alternating marches x 1 min, alternating knee extensions x 1 min Sit to stand from physioball x10- not today 60 lb resisted trunk walk outs forward/back, right, left with turning head R/L-  8x ea scenario- not today Sit to stand with head turn to R or L target on wall x10, 2 sets ea direction- not today Sit to stand with head turn + initiating few steps to R or L combined x 8, 3 rounds ea direction; then added intervals of weaving figure 8 in/out of cones x 4; then added intervals of stepping over hurdles/various heights and obstacles x4; then added carrying box- not today Amb in hallway head turns R/L 2 laps and up/down 2 laps ea then 2 laps tossing ball R/L follow with eyes Amb in hallway watching ball in her hands- eyes on moving horizontal target, 3 laps R/L and 2 laps up/down Standing D2 pattern squat with follow ball with eyes low to high R and L x 15 ea direction Tandem walk 4x20 ft with eyes focused on various targets, not looking at feet - on airex today Lateral hurdles outside parallel bars without UE support 4 laps R/L- not today Gaze stabilization (horizontal head turns) 20-30 sec intervals, static standing, feet together standing, standing on airex feet together, and forward walk + gaze stabilization dual tasking: 15 min  Not today: SLS: 3x ea LE focusing on keeping eyes on target in front, 20-30 sec intervals in parallel bars Tandem stance, both ways, 3x with 5 lb med ball overhead lift x 5 reps, 2 sets - not today Tandem walk on grass: 3 laps x 20 ft with PT (gait belt  and CGA)- significant trunk/hip lateral movement noted, pt uses stepping strategy to regain balance Grape vine stepping R/L in grass: 3 laps x 20 ft with PT (gait belt and supervision) no loss of balance  Blue ladder on floor: alternating high marches forward 3 laps, lateral high knees 2 laps, diagonal each direction 2 laps ea- not today Sit to stand with 360 deg turn, 5 lb medball toss from PT, turn opposite way, pass ball back to PT then sit: 3x CW and 3X CCW, repeated 2 sets Ball toss at trampoline (feet together) on airex: forward, 5 lbs 3x30 seconds- not today Feet together on airex: head turns R/L 30 second intervals x 3, up/down 30 second intervals x3 (PT close supervision)- not today Weave in/out of cones- turning directions, practiced keeping eyes ahead, 4 laps- not today VOR: feet together 3x 30 seconds R/L and up/down standing- HEP  Sit to stand and then amb around corner with surface change (concrete/grass/mulch outdoors)- practiced R and L and 180 deg pivot turn.  Practiced keeping eyes ahead to scan environment instead of looking at feet. 3x ea- not today Amb on grass outdoor surfaces: forward/reverse intervals 3x 25 ft with PT verbal cues for sudden direction changes; then head turns R/L 2 laps, look up/down 2 laps (gait belt, PT supervision)     Therapeutic Activities: Not today  Sit to stand holding 5 lb and walk in small CW or CWW circle around chair then sit down without hands- 8 reps ea direction (Not today) Obstacle course: stepping over/around various heights, squat lift green physioball floor to waist, turn to R or L and put behind back, and 15 wall squats with physioball behind, set ball down, turn to R or L to return over obstacles.  Practiced multiple trials x 10 min, to simulate activities in home like bending to lift something and turning directions to carry it while also integrating LE strengthening with squat  Added carrying box (shoe box in hands) for dual tasking  while completing sit to stand with 180  deg turn to walk around a corner to R/L while holding something at home like a functional activity she describes having difficulty with at home and stepping around cones/over obstacles, having to scan environment with eyes for safety (PT supervision with gait belt)   Therapeutic Exercise: 10 min Sit to stand 2x12 holding 10 lb med ball with overhead press Squats 2x10 holding 10 lb med ball Star lunge: R and L, focused on anterior/diagonal/lateral then posterior/diagonal/lateral 5 rounds ea, ea LE Nustep x 6 min for LE strength level 5- not today Heel raises x20, 2 sets Toe raises x20, 2 sets  Towel curls L foot x 1 min- not today Self toe flexion stretch L x 30 seconds, 2 reps- not today Bosu: step ups- RLLR, and LRRL x15 ea  (Not today) Lateral band walk with blue TB at distal thighs, 4 laps x 20 ft Ascend/descend stairs: using single railing, x 4 laps- not today Stepping over obstacles: 4 laps in parallel bars (multi height hurdles)- not today Heel raises: 2x10 5# ankle weights- not today Toe raises: 2x10 5# ankle weights- not today Front step ups on 6 inch step: LRRL, RLLR x10 ea- not today Standing hamstring curl: 5# 2x12 Seated knee extension: 5# 2x12 Deadlifts: 2x9# dumbbells, 2x10- not today  HEP instruction- see below  PATIENT EDUCATION: Education details: PT POC/tx for balance and strength deficits Person educated: Patient Education method: Explanation Education comprehension: verbalized understanding  HOME EXERCISE PROGRAM: Access Code: [email or web address] URL: [email or web address] Date: 03/09/2023 Prepared by: Vernell Reges  Exercises - Sit to Stand Without Arm Support  - 1 x daily - 7 x weekly - 4 sets - 5 reps - Tandem Walking with Counter Support  - 1 x daily - 7 x weekly - 4 sets - Single Leg Stance  - 1 x daily - 7 x weekly - 3-5 reps - 15 hold - Seated Gaze Stabilization with Head Nod  - 1 x daily - 7 x weekly  - 30 reps - 30 sec hold - Seated Gaze Stabilization with Head Rotation  - 1 x daily - 7 x weekly - 3 reps - 30 sec hold   GOALS: Goals reviewed with patient? Yes  SHORT TERM GOALS: Target date: 09/07/23  Pt will demonstrate ability to perform HEP for LE strengthening and balance exercises with proper technique/form in clinic Baseline: pt is not formally performing strength exercises currently at home; 04/27/23 in progress Goal status: In progress  LONG TERM GOALS: Target date: 09/07/23  Improve LE strength 1/2 MMT grade to facilitate improved ability to ambulate down her hallway at home and descend/ascend stairs in her home without losing balance Baseline: 4/5; 5/5 hip flexion (not knee extension or flex yet- still 4/5), ascending and descending stairs with improved balance, but hasn't been home to trial this in a few weeks; pt reports she is ascending/descending stairs at home without difficulty, but continues to lean heavily into wall sometimes in her hallway if she is moving quickly and going around a corner from one room to the next Goal status: In progress  2.  Improve FGA score to >23 indicating decreased fall risk during ambulation Baseline: to be administered at 2nd visit; will modifiy this goal accordingly if needed Goal status: In progress  3.  Improve 5x STS time to <15 seconds indicating decreased fall risk in population age >82 y/o Baseline: 15; 04/27/23: 9 seconds best trial, 11 seconds other trial Goal status: MET    4. Improve ABC score >13  points (> 88%) indicating reduced risk for falls   Baseline 06/10/23: 74%; 6/30:81%   Goal status: In progress  ASSESSMENT:  CLINICAL IMPRESSION: Pt remains very motivated to participate in PT and also notes that personal life stressors are impacting her overall well being.  Continued to incorporate additional gaze stabilization type exercises into tx today after her vestibular PT consult last week.  She does note this recreates her sx  with horizontal head turns; she notes sx resolve after each rep within a minute.  Overall, making progress towards PT goals; Likely not yet met max benefit from skilled PT, recommend continuing tx to address strength/balance/gait impairments to improve safety during her activities indoors and outdoors at home and to reduce fall risk.  OBJECTIVE IMPAIRMENTS: Abnormal gait, decreased activity tolerance, decreased balance, decreased endurance, decreased strength, and impaired perceived functional ability.   ACTIVITY LIMITATIONS: standing, transfers, and locomotion level  PARTICIPATION LIMITATIONS: cleaning, interpersonal relationship, shopping, community activity, and yard work  PERSONAL FACTORS: Age, Time since onset of injury/illness/exacerbation, and 1-2 comorbidities: long COVID, orthopedic hx (see above) are also affecting patient's functional outcome.   REHAB POTENTIAL: Good  CLINICAL DECISION MAKING: Evolving/moderate complexity  EVALUATION COMPLEXITY: Moderate  PLAN:  PT FREQUENCY: 2x/week  PT DURATION: 6 weeks  PLANNED INTERVENTIONS: 97110-Therapeutic exercises, 97530- Therapeutic activity, [id number]- Neuromuscular re-education, 97535- Self Care, and [id number]- Manual therapy  PLAN FOR NEXT SESSION: continue incorporating gaze stabilization exercises into tx  Vernell Reges, PT, DPT, OCS  Amaryllis Malmquist E Franklin [place], PT 08/17/2023, 5:25 PM

## 2023-08-21 ENCOUNTER — Ambulatory Visit

## 2023-08-21 DIAGNOSIS — R2681 Unsteadiness on feet: Secondary | ICD-10-CM

## 2023-08-21 DIAGNOSIS — R2689 Other abnormalities of gait and mobility: Secondary | ICD-10-CM

## 2023-08-21 DIAGNOSIS — R42 Dizziness and giddiness: Secondary | ICD-10-CM

## 2023-08-21 NOTE — Therapy (Addendum)
 OUTPATIENT PHYSICAL THERAPY NEURO TREATMENT/RE-CERT THROUGH 09/07/23   Patient Name: Amber Richardson MRN: [id number] DOB:1955/07/08, 68 y.o., female Today's Date: 08/21/2023  PCP: Dr. Glendia, MD  REFERRING PROVIDER: same  END OF SESSION:  PT End of Session - 08/21/23 1026     Visit Number 29    Number of Visits 46    Date for PT Re-Evaluation 09/07/23    Authorization Type 2x/week x 6 weeks (PN done on recert on 07/27/23)    Authorization Time Period Medicare, PN needed at visit #36    PT Start Time 1025    PT Stop Time 1110    PT Time Calculation (min) 45 min    Equipment Utilized During Treatment Gait belt    Activity Tolerance Patient tolerated treatment well    Behavior During Therapy [facility] for tasks assessed/performed              Past Medical History:  Diagnosis Date   Allergies    Basal cell carcinoma    Fatigue 08/30/2014   Generalized anxiety disorder 03/21/2011   GERD (gastroesophageal reflux disease)    History of COVID-19 02/22/2019   HLD (hyperlipidemia)    HSV infection 09/20/2014   Hypercholesterolemia 02/08/2015   Hyperglycemia 08/28/2019   Hyperthyroidism    Major depressive disorder    Osteoporosis 05/13/2015   Pyelonephritis 1985   Scalp, arm, and leg tingling 08/28/2019   Tendinosis 09/12/2013   Rotator cuff tendinosis. Mild capsulitis.   Tremor 08/28/2019   Subtle to mild, upper extremities, left worse than right   Past Surgical History:  Procedure Laterality Date   DILATION AND CURETTAGE OF UTERUS     Miscarriage     x1   TONSILLECTOMY  1963   Patient Active Problem List   Diagnosis Date Noted   Head injury 06/02/2023   Balance problem 01/02/2023   Poison ivy dermatitis 09/26/2022   Urinary frequency 09/26/2022   Abnormal liver function tests 08/22/2022   Flank pain 08/22/2022   Cold intolerance 08/11/2022   Concussion 08/11/2022   Diarrhea 03/26/2022   Headache 01/29/2022   Melanoma in situ (HCC) 06/08/2021   Contact  dermatitis and eczema due to plant 05/31/2021   Healthcare maintenance 11/11/2020   Rib pain on right side 09/30/2020   Joint pain 09/19/2020   Chest congestion 06/17/2020   Joint stiffness 06/17/2020   Change in vision 06/17/2020   Unsteady gait 06/17/2020   Moderate episode of recurrent major depressive disorder (HCC) 06/17/2020   Muscle weakness 06/13/2020   Stress 11/14/2019   Breast cancer screening 11/14/2019   Major depressive disorder    Disorder of bursae of shoulder region 09/01/2019   Knee pain 09/01/2019   Low back pain 09/01/2019   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   Tremor 08/28/2019   Hyperglycemia 08/28/2019   History of COVID-19 02/20/2019   Left arm pain 01/09/2019   Neck pain 09/04/2018   Dizziness 12/19/2017   Dyspnea 09/13/2017   Left facial numbness 06/14/2015   Osteoporosis 05/13/2015   Hypercholesterolemia 02/08/2015   Left foot pain 11/04/2014   Atrophic vaginitis 09/20/2014   HSV infection 09/20/2014   Pelvic pain in female 09/20/2014   Fatigue 08/30/2014   Chest pain 08/30/2014   Abdominal pain 08/29/2014   Tendinosis 09/12/2013   Basal cell carcinoma 03/17/2012   Hyperthyroidism 03/17/2012   Fibroids 01/19/2012   BV (bacterial vaginosis) 01/05/2012   PMB (postmenopausal bleeding) 01/05/2012   Generalized anxiety disorder 03/21/2011   GERD (gastroesophageal reflux disease) 03/21/2011  ONSET DATE: 2021  REFERRING DIAG: R26.89 (ICD-10-CM) - Balance problem   THERAPY DIAG:  Unsteadiness on feet  Balance problem  Dizziness and giddiness  Rationale for Evaluation and Treatment: Rehabilitation  SUBJECTIVE: From initial evaluation note 03/02/23                                                                                                                                                                                            SUBJECTIVE STATEMENT: Pt states she is having difficulty with her balance.  She has been noticing it for several  months.  It is progessively worsening.  She feels unsteady on her feet at times while walking and has to lean into the wall or reach for support.  She attributes the balance changes to her long COVID recovery process.  She is not falling to floor.  But when she stands up and starts moving example: walking towards a door and then leans on the door or leans on the wall for balance; sometimes she is weaving when she ambulates.  She notices she veers to one side more than the other.  She has a consult with [facility] neurolog[facility] in March 2025 for a consult.  She had seen a neurologist earlier in the long COVID recovery process.  She has also seen her cardiologist and r/o cardiac reasons.    She has not been able to exercise a lot since COVID.  She still has sort term memory issues too.  She does walk 1.5 miles- flat neighborhood; she has not done any strength training.  If she overdoes it she will have a flare up for 2 days.    She is not lightheaded, no dizziness noted.  Pt accompanied by: self  PERTINENT HISTORY: Jan 2021- had COVID and long COVID sx; spent 6 months lying bed, had a tough recovery; has had exploratory R ankle arthroscopy, no other LE  or spine injuries or surgeries.  Retired Teacher, early years/pre   PAIN:  Are you having pain? No  PRECAUTIONS: Fall  RED FLAGS: None   WEIGHT BEARING RESTRICTIONS: No  FALLS: Has patient fallen in last 6 months? No, but catches herself with the wall/doorway  LIVING ENVIRONMENT: Lives with: lives with their spouse Lives in: House/apartment Stairs: yes, 3 story home- she is concerned about her balance going down the stairs- does not feel stable and leans to the R when going down the stairs; sometimes has R ankle pain while going down the stairs- has rail on the L but not on the R; has 3 steps to enter Has following equipment at home: None  PLOF: Independent  PATIENT GOALS: to  improve balance  OBJECTIVE:  Note: Objective measures were completed at  Evaluation unless otherwise noted. (Portions deferred secondary to time constraints as pt arrived late to appointment)  COGNITION: Overall cognitive status: Within functional limits for tasks assessed   SENSATION: Not tested  COORDINATION: deferred  POSTURE: rounded shoulders  LOWER EXTREMITY ROM:   functionally able to perform sit to stand independently  Active  Right Eval Left Eval  Hip flexion    Hip extension    Hip abduction    Hip adduction    Hip internal rotation    Hip external rotation    Knee flexion    Knee extension    Ankle dorsiflexion    Ankle plantarflexion    Ankle inversion    Ankle eversion     (Blank rows = not tested)  LOWER EXTREMITY MMT:    MMT Right Eval Left Eval  Hip flexion 4 4  Hip extension 4 4  Hip abduction    Hip adduction    Hip internal rotation    Hip external rotation    Knee flexion 4 4  Knee extension 4 4  Ankle dorsiflexion    Ankle plantarflexion    Ankle inversion    Ankle eversion    (Blank rows = not tested)   GAIT:  Will further assess at next visit  FUNCTIONAL TESTS:  5 times sit to stand: 15 seconds SL balance: R x 20 seconds, L x 12 seconds  PATIENT SURVEYS:  FGA to be administered at second visit                                                                                                                              TREATMENT DATE:08/21/23 Subjective: pt continues to experience multiple life stressors; husband just had surgery this week for ruptured Achilles and she has been doing more of the household management.  She went to the neighborhood pool and did a little pool exercises- this felt good and was almost what she would describe as being a perfect day.  No falls since last session.  She feels like her legs got a good workout after her own pools exercises, she used to do water aerobics regularly.  Pain: 0/10  Objective: SLS balance R x 28 seconds, L x 18 seconds (was 20 and 12 at initial  evaluation) LE MMT: hip abd R 4-/5, L 4/5 today Activities specific balance scale: 81%  Assessed Gr toe extension R and L at 80 deg extension Symmetrical MTP 2-5 extension AROM b/l L MTP 2-5 flexion AROM 50% limited compared to R and MMT 4/5 L vs 5/5 R for toe flexion   Neuro re-ed: 30 min Sit on physioball: trunk/head diagonal D1/D2 look at cone with arms outstretched, follow with eyes x 10 ea direction, each diagonal- not today Seated on physioball: alternating marches x 1 min, alternating knee extensions x 1 min Sit to stand from physioball x10- not today 60  lb resisted trunk walk outs forward/back, right, left with turning head R/L- 8x ea scenario- not today Sit to stand with head turn to R or L target on wall x10, 2 sets ea direction- not today Sit to stand with head turn + initiating few steps to R or L combined x 8, 3 rounds ea direction; then added intervals of weaving figure 8 in/out of cones x 4; then added intervals of stepping over hurdles/various heights and obstacles x4; then added carrying box- not today Amb in hallway head turns R/L 2 laps and up/down 2 laps ea then 2 laps tossing ball R/L follow with eyes Amb in hallway watching ball in her hands- eyes on moving horizontal target, 3 laps R/L and 2 laps up/down Standing D2 pattern squat with follow ball with eyes low to high R and L x 15 ea direction Tandem walk 4x20 ft with eyes focused on various targets, not looking at feet - on airex today Lateral hurdles outside parallel bars without UE support 4 laps R/L- not today Gaze stabilization (horizontal head turns) 20-30 sec intervals, static standing, feet together standing, standing on airex feet together, and forward walk + gaze stabilization dual tasking: standing on airex today with feet together 3x Standing alternating toe taps on cones: 1 min intervals, not looking at feet Single leg balance: 3x ea LE with eyes looking forward 20-30 second intervals ea  Not today: SLS:  3x ea LE focusing on keeping eyes on target in front, 20-30 sec intervals in parallel bars Tandem stance, both ways, 3x with 5 lb med ball overhead lift x 5 reps, 2 sets - not today Tandem walk on grass: 3 laps x 20 ft with PT (gait belt and CGA)- significant trunk/hip lateral movement noted, pt uses stepping strategy to regain balance Grape vine stepping R/L in grass: 3 laps x 20 ft with PT (gait belt and supervision) no loss of balance  Blue ladder on floor: alternating high marches forward 3 laps, lateral high knees 2 laps, diagonal each direction 2 laps ea- not today Sit to stand with 360 deg turn, 5 lb medball toss from PT, turn opposite way, pass ball back to PT then sit: 3x CW and 3X CCW, repeated 2 sets Ball toss at trampoline (feet together) on airex: forward, 5 lbs 3x30 seconds- not today Feet together on airex: head turns R/L 30 second intervals x 3, up/down 30 second intervals x3 (PT close supervision)- not today Weave in/out of cones- turning directions, practiced keeping eyes ahead, 4 laps- not today VOR: feet together 3x 30 seconds R/L and up/down standing- HEP  Sit to stand and then amb around corner with surface change (concrete/grass/mulch outdoors)- practiced R and L and 180 deg pivot turn.  Practiced keeping eyes ahead to scan environment instead of looking at feet. 3x ea- not today Amb on grass outdoor surfaces: forward/reverse intervals 3x 25 ft with PT verbal cues for sudden direction changes; then head turns R/L 2 laps, look up/down 2 laps (gait belt, PT supervision)     Therapeutic Activities: Not today  Sit to stand holding 5 lb and walk in small CW or CWW circle around chair then sit down without hands- 8 reps ea direction (Not today) Obstacle course: stepping over/around various heights, squat lift green physioball floor to waist, turn to R or L and put behind back, and 15 wall squats with physioball behind, set ball down, turn to R or L to return over obstacles.   Practiced multiple  trials x 10 min, to simulate activities in home like bending to lift something and turning directions to carry it while also integrating LE strengthening with squat  Added carrying box (shoe box in hands) for dual tasking while completing sit to stand with 180 deg turn to walk around a corner to R/L while holding something at home like a functional activity she describes having difficulty with at home and stepping around cones/over obstacles, having to scan environment with eyes for safety (PT supervision with gait belt)   Therapeutic Exercise: 10 min Squats 2x10 holding 10 lb med ball Star lunge: R and L, focused on anterior/diagonal/lateral then posterior/diagonal/lateral 5 rounds ea, ea LE Heel raises x20, 2 sets Toe raises x20, 2 sets Bosu: step ups- RLLR, and LRRL x15 ea Towel curls L foot x 1 min- not today Self toe flexion stretch L x 30 seconds, 2 reps- not today Nustep x 6 min for LE strength level 5- not today  (Not today) Lateral band walk with blue TB at distal thighs, 4 laps x 20 ft Ascend/descend stairs: using single railing, x 4 laps- not today Stepping over obstacles: 4 laps in parallel bars (multi height hurdles)- not today Heel raises: 2x10 5# ankle weights- not today Toe raises: 2x10 5# ankle weights- not today Front step ups on 6 inch step: LRRL, RLLR x10 ea- not today Standing hamstring curl: 5# 2x12 Seated knee extension: 5# 2x12 Deadlifts: 2x9# dumbbells, 2x10- not today  HEP instruction- see below  PATIENT EDUCATION: Education details: PT POC/tx for balance and strength deficits, pt ed for benefits of continuing with her pool exercises while community pool is open for the remainder of the summer Person educated: Patient Education method: Explanation Education comprehension: verbalized understanding  HOME EXERCISE PROGRAM: Access Code: [email or web address] URL: [email or web address] Date: 03/09/2023 Prepared by: Vernell Reges  Exercises - Sit to Stand Without Arm Support  - 1 x daily - 7 x weekly - 4 sets - 5 reps - Tandem Walking with Counter Support  - 1 x daily - 7 x weekly - 4 sets - Single Leg Stance  - 1 x daily - 7 x weekly - 3-5 reps - 15 hold - Seated Gaze Stabilization with Head Nod  - 1 x daily - 7 x weekly - 30 reps - 30 sec hold - Seated Gaze Stabilization with Head Rotation  - 1 x daily - 7 x weekly - 3 reps - 30 sec hold   GOALS: Goals reviewed with patient? Yes  SHORT TERM GOALS: Target date: 09/07/23  Pt will demonstrate ability to perform HEP for LE strengthening and balance exercises with proper technique/form in clinic Baseline: pt is not formally performing strength exercises currently at home; 04/27/23 in progress Goal status: In progress  LONG TERM GOALS: Target date: 09/07/23  Improve LE strength 1/2 MMT grade to facilitate improved ability to ambulate down her hallway at home and descend/ascend stairs in her home without losing balance Baseline: 4/5; 5/5 hip flexion (not knee extension or flex yet- still 4/5), ascending and descending stairs with improved balance, but hasn't been home to trial this in a few weeks; pt reports she is ascending/descending stairs at home without difficulty, but continues to lean heavily into wall sometimes in her hallway if she is moving quickly and going around a corner from one room to the next Goal status: In progress  2.  Improve FGA score to >23 indicating decreased fall risk during ambulation Baseline: to be administered at 2nd  visit; will modifiy this goal accordingly if needed Goal status: In progress  3.  Improve 5x STS time to <15 seconds indicating decreased fall risk in population age >17 y/o Baseline: 15; 04/27/23: 9 seconds best trial, 11 seconds other trial Goal status: MET    4. Improve ABC score >13 points (> 88%) indicating reduced risk for falls   Baseline 06/10/23: 74%; 6/30:81%   Goal status: In  progress  ASSESSMENT:  CLINICAL IMPRESSION: Pt remains very motivated to participate in PT and also notes that personal life stressors are impacting her overall well being and ability to work on HEP consistently.  Reviewed benefit of working on gaze stabilization exercise at home to increase frequency of this neuro-re education activity.  She does note the gaze stabilization with horizontal head turns recreates her sx with horizontal head turns; she notes sx resolve after each rep within a minute. Demonstrated improved balance with SLS today and cone toe taps.  Would benefit from continuing with her pool exercise program she did this week at her community pool.  Overall, making progress towards PT goals; Likely not yet met max benefit from skilled PT, recommend continuing tx to address strength/balance/gait impairments to improve safety during her activities indoors and outdoors at home and to reduce fall risk.  OBJECTIVE IMPAIRMENTS: Abnormal gait, decreased activity tolerance, decreased balance, decreased endurance, decreased strength, and impaired perceived functional ability.   ACTIVITY LIMITATIONS: standing, transfers, and locomotion level  PARTICIPATION LIMITATIONS: cleaning, interpersonal relationship, shopping, community activity, and yard work  PERSONAL FACTORS: Age, Time since onset of injury/illness/exacerbation, and 1-2 comorbidities: long COVID, orthopedic hx (see above) are also affecting patient's functional outcome.   REHAB POTENTIAL: Good  CLINICAL DECISION MAKING: Evolving/moderate complexity  EVALUATION COMPLEXITY: Moderate  PLAN:  PT FREQUENCY: 2x/week  PT DURATION: 6 weeks  PLANNED INTERVENTIONS: 97110-Therapeutic exercises, 97530- Therapeutic activity, [id number]- Neuromuscular re-education, 97535- Self Care, and [id number]- Manual therapy  PLAN FOR NEXT SESSION: continue incorporating gaze stabilization exercises into tx  Vernell Reges, PT, DPT, OCS  Vernell FORBES Reges,  PT 08/21/2023, 10:27 AM

## 2023-08-24 ENCOUNTER — Ambulatory Visit

## 2023-08-24 DIAGNOSIS — R2681 Unsteadiness on feet: Secondary | ICD-10-CM | POA: Diagnosis not present

## 2023-08-24 DIAGNOSIS — R2689 Other abnormalities of gait and mobility: Secondary | ICD-10-CM

## 2023-08-24 DIAGNOSIS — R42 Dizziness and giddiness: Secondary | ICD-10-CM

## 2023-08-24 NOTE — Therapy (Signed)
 OUTPATIENT PHYSICAL THERAPY NEURO TREATMENT/RE-CERT THROUGH 09/07/23   Patient Name: Amber Richardson MRN: [id number] DOB:1955-02-05, 68 y.o., female Today's Date: 08/24/2023  PCP: Dr. Glendia, MD  REFERRING PROVIDER: same  END OF SESSION:  PT End of Session - 08/24/23 1051     Visit Number 30    Number of Visits 46    Date for PT Re-Evaluation 09/07/23    Authorization Type 2x/week x 6 weeks (PN done on recert on 07/27/23)    Authorization Time Period Medicare, PN needed at visit #36    PT Start Time 1055    PT Stop Time 1140    PT Time Calculation (min) 45 min    Equipment Utilized During Treatment Gait belt    Activity Tolerance Patient tolerated treatment well    Behavior During Therapy [facility] for tasks assessed/performed              Past Medical History:  Diagnosis Date   Allergies    Basal cell carcinoma    Fatigue 08/30/2014   Generalized anxiety disorder 03/21/2011   GERD (gastroesophageal reflux disease)    History of COVID-19 02/22/2019   HLD (hyperlipidemia)    HSV infection 09/20/2014   Hypercholesterolemia 02/08/2015   Hyperglycemia 08/28/2019   Hyperthyroidism    Major depressive disorder    Osteoporosis 05/13/2015   Pyelonephritis 1985   Scalp, arm, and leg tingling 08/28/2019   Tendinosis 09/12/2013   Rotator cuff tendinosis. Mild capsulitis.   Tremor 08/28/2019   Subtle to mild, upper extremities, left worse than right   Past Surgical History:  Procedure Laterality Date   DILATION AND CURETTAGE OF UTERUS     Miscarriage     x1   TONSILLECTOMY  1963   Patient Active Problem List   Diagnosis Date Noted   Head injury 06/02/2023   Balance problem 01/02/2023   Poison ivy dermatitis 09/26/2022   Urinary frequency 09/26/2022   Abnormal liver function tests 08/22/2022   Flank pain 08/22/2022   Cold intolerance 08/11/2022   Concussion 08/11/2022   Diarrhea 03/26/2022   Headache 01/29/2022   Melanoma in situ (HCC) 06/08/2021   Contact  dermatitis and eczema due to plant 05/31/2021   Healthcare maintenance 11/11/2020   Rib pain on right side 09/30/2020   Joint pain 09/19/2020   Chest congestion 06/17/2020   Joint stiffness 06/17/2020   Change in vision 06/17/2020   Unsteady gait 06/17/2020   Moderate episode of recurrent major depressive disorder (HCC) 06/17/2020   Muscle weakness 06/13/2020   Stress 11/14/2019   Breast cancer screening 11/14/2019   Major depressive disorder    Disorder of bursae of shoulder region 09/01/2019   Knee pain 09/01/2019   Low back pain 09/01/2019   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   Tremor 08/28/2019   Hyperglycemia 08/28/2019   History of COVID-19 02/20/2019   Left arm pain 01/09/2019   Neck pain 09/04/2018   Dizziness 12/19/2017   Dyspnea 09/13/2017   Left facial numbness 06/14/2015   Osteoporosis 05/13/2015   Hypercholesterolemia 02/08/2015   Left foot pain 11/04/2014   Atrophic vaginitis 09/20/2014   HSV infection 09/20/2014   Pelvic pain in female 09/20/2014   Fatigue 08/30/2014   Chest pain 08/30/2014   Abdominal pain 08/29/2014   Tendinosis 09/12/2013   Basal cell carcinoma 03/17/2012   Hyperthyroidism 03/17/2012   Fibroids 01/19/2012   BV (bacterial vaginosis) 01/05/2012   PMB (postmenopausal bleeding) 01/05/2012   Generalized anxiety disorder 03/21/2011   GERD (gastroesophageal reflux disease) 03/21/2011  ONSET DATE: 2021  REFERRING DIAG: R26.89 (ICD-10-CM) - Balance problem   THERAPY DIAG:  Unsteadiness on feet  Balance problem  Dizziness and giddiness  Rationale for Evaluation and Treatment: Rehabilitation  SUBJECTIVE: From initial evaluation note 03/02/23                                                                                                                                                                                            SUBJECTIVE STATEMENT: Pt states she is having difficulty with her balance.  She has been noticing it for several  months.  It is progessively worsening.  She feels unsteady on her feet at times while walking and has to lean into the wall or reach for support.  She attributes the balance changes to her long COVID recovery process.  She is not falling to floor.  But when she stands up and starts moving example: walking towards a door and then leans on the door or leans on the wall for balance; sometimes she is weaving when she ambulates.  She notices she veers to one side more than the other.  She has a consult with [facility] neurolog[facility] in March 2025 for a consult.  She had seen a neurologist earlier in the long COVID recovery process.  She has also seen her cardiologist and r/o cardiac reasons.    She has not been able to exercise a lot since COVID.  She still has sort term memory issues too.  She does walk 1.5 miles- flat neighborhood; she has not done any strength training.  If she overdoes it she will have a flare up for 2 days.    She is not lightheaded, no dizziness noted.  Pt accompanied by: self  PERTINENT HISTORY: Jan 2021- had COVID and long COVID sx; spent 6 months lying bed, had a tough recovery; has had exploratory R ankle arthroscopy, no other LE  or spine injuries or surgeries.  Retired Teacher, early years/pre   PAIN:  Are you having pain? No  PRECAUTIONS: Fall  RED FLAGS: None   WEIGHT BEARING RESTRICTIONS: No  FALLS: Has patient fallen in last 6 months? No, but catches herself with the wall/doorway  LIVING ENVIRONMENT: Lives with: lives with their spouse Lives in: House/apartment Stairs: yes, 3 story home- she is concerned about her balance going down the stairs- does not feel stable and leans to the R when going down the stairs; sometimes has R ankle pain while going down the stairs- has rail on the L but not on the R; has 3 steps to enter Has following equipment at home: None  PLOF: Independent  PATIENT GOALS: to  improve balance  OBJECTIVE:  Note: Objective measures were completed at  Evaluation unless otherwise noted. (Portions deferred secondary to time constraints as pt arrived late to appointment)  COGNITION: Overall cognitive status: Within functional limits for tasks assessed   SENSATION: Not tested  COORDINATION: deferred  POSTURE: rounded shoulders  LOWER EXTREMITY ROM:   functionally able to perform sit to stand independently  Active  Right Eval Left Eval  Hip flexion    Hip extension    Hip abduction    Hip adduction    Hip internal rotation    Hip external rotation    Knee flexion    Knee extension    Ankle dorsiflexion    Ankle plantarflexion    Ankle inversion    Ankle eversion     (Blank rows = not tested)  LOWER EXTREMITY MMT:    MMT Right Eval Left Eval  Hip flexion 4 4  Hip extension 4 4  Hip abduction    Hip adduction    Hip internal rotation    Hip external rotation    Knee flexion 4 4  Knee extension 4 4  Ankle dorsiflexion    Ankle plantarflexion    Ankle inversion    Ankle eversion    (Blank rows = not tested)   GAIT:  Will further assess at next visit  FUNCTIONAL TESTS:  5 times sit to stand: 15 seconds SL balance: R x 20 seconds, L x 12 seconds  PATIENT SURVEYS:  FGA to be administered at second visit                                                                                                                              TREATMENT DATE:08/24/23 Subjective: pt has gone to the pool a few more times since last session; working on her balance exercises and strength exercises in the pool and doing some lap swimming.  Her balance has been fairly good since last session; no falls; has had a few episodes of turning and hitting into doorframe this weekend.  Pain: 0/10  Objective: SLS balance R x 28 seconds, L x 18 seconds (was 20 and 12 at initial evaluation) LE MMT: hip abd R 4-/5, L 4/5 today Activities specific balance scale: 81%  Assessed Gr toe extension R and L at 80 deg extension Symmetrical MTP 2-5  extension AROM b/l L MTP 2-5 flexion AROM 50% limited compared to R and MMT 4/5 L vs 5/5 R for toe flexion  Neuro re-ed: 30 min Amb in hallway head turns R/L 2 laps and up/down 2 laps  Amb in hallway 2 laps tossing ball R/L follow with eyes with PT  Amb in hallway watching ball in her hands- eyes on moving horizontal target, 2 laps R/L and 2 laps up/down, 2 laps D2 R/2 laps D2 L, 2 laps moving ball close/far  Standing D2 pattern squat with follow ball with eyes low to high R and L  x 15 ea direction  Gaze stabilization (horizontal head turns) 20-30 sec intervals, static standing, feet together standing, standing on airex feet together, and forward walk + gaze stabilization dual tasking: standing on airex today with feet together 3x  3 way lunges on blue mat (for balance challenge)- forward, lateral, reverse- 6 laps ea side   Not today: Tandem walk 4x20 ft with eyes focused on various targets, not looking at feet - on airex- not today Lateral hurdles outside parallel bars without UE support 4 laps R/L- not today Standing alternating toe taps on cones: 1 min intervals, not looking at feet Single leg balance: 3x ea LE with eyes looking forward 20-30 second intervals ea Sit to stand from physioball x10- not today 60 lb resisted trunk walk outs forward/back, right, left with turning head R/L- 8x ea scenario- not today Sit to stand with head turn to R or L target on wall x10, 2 sets ea direction- not today Sit to stand with head turn + initiating few steps to R or L combined x 8, 3 rounds ea direction; then added intervals of weaving figure 8 in/out of cones x 4; then added intervals of stepping over hurdles/various heights and obstacles x4; then added carrying box- not today   Therapeutic Activities:  Squats: 2x12 to mat table Transfer standing to floor via quadruped 8x, used blue mat- worked on for functional activity as she has to assist her brother with putting on an ankle brace daily;  practiced this for balance/safety; also practiced as a fall recovery technique strategy; discussed option to use furniture/firm chair to push up with 1 UE from tall kneeling too if she is feeling a bit fatigued- pt able to perform all versions today (PT supervision/tactile, verbal, demonstration cues and instructions with pt gait belt)  Sit to stand holding 5 lb and walk in small CW or CWW circle around chair then sit down without hands- 8 reps ea direction  (Not today) Obstacle course: stepping over/around various heights, squat lift green physioball floor to waist, turn to R or L and put behind back, and 15 wall squats with physioball behind, set ball down, turn to R or L to return over obstacles.  Practiced multiple trials x 10 min, to simulate activities in home like bending to lift something and turning directions to carry it while also integrating LE strengthening with squat  Added carrying box (shoe box in hands) for dual tasking while completing sit to stand with 180 deg turn to walk around a corner to R/L while holding something at home like a functional activity she describes having difficulty with at home and stepping around cones/over obstacles, having to scan environment with eyes for safety (PT supervision with gait belt)   Therapeutic Exercise: 10 min- not today Squats 2x10 holding 10 lb med ball Star lunge: R and L, focused on anterior/diagonal/lateral then posterior/diagonal/lateral 5 rounds ea, ea LE Heel raises x20, 2 sets Toe raises x20, 2 sets Bosu: step ups- RLLR, and LRRL x15 ea Towel curls L foot x 1 min- not today Self toe flexion stretch L x 30 seconds, 2 reps- not today Nustep x 6 min for LE strength level 5- not today  (Not today) Lateral band walk with blue TB at distal thighs, 4 laps x 20 ft Ascend/descend stairs: using single railing, x 4 laps- not today Stepping over obstacles: 4 laps in parallel bars (multi height hurdles)- not today Heel raises: 2x10 5#  ankle weights- not today Toe raises:  2x10 5# ankle weights- not today Front step ups on 6 inch step: LRRL, RLLR x10 ea- not today Standing hamstring curl: 5# 2x12 Seated knee extension: 5# 2x12 Deadlifts: 2x9# dumbbells, 2x10- not today  HEP instruction- see below  PATIENT EDUCATION: Education details: PT POC/tx for balance and strength deficits, pt ed for benefits of continuing with her pool exercises while community pool is open for the remainder of the summer Person educated: Patient Education method: Explanation Education comprehension: verbalized understanding  HOME EXERCISE PROGRAM: Access Code: [email or web address] URL: [email or web address] Date: 03/09/2023 Prepared by: Vernell Reges  Exercises - Sit to Stand Without Arm Support  - 1 x daily - 7 x weekly - 4 sets - 5 reps - Tandem Walking with Counter Support  - 1 x daily - 7 x weekly - 4 sets - Single Leg Stance  - 1 x daily - 7 x weekly - 3-5 reps - 15 hold - Seated Gaze Stabilization with Head Nod  - 1 x daily - 7 x weekly - 30 reps - 30 sec hold - Seated Gaze Stabilization with Head Rotation  - 1 x daily - 7 x weekly - 3 reps - 30 sec hold   GOALS: Goals reviewed with patient? Yes  SHORT TERM GOALS: Target date: 09/07/23  Pt will demonstrate ability to perform HEP for LE strengthening and balance exercises with proper technique/form in clinic Baseline: pt is not formally performing strength exercises currently at home; 04/27/23 in progress Goal status: In progress  LONG TERM GOALS: Target date: 09/07/23  Improve LE strength 1/2 MMT grade to facilitate improved ability to ambulate down her hallway at home and descend/ascend stairs in her home without losing balance Baseline: 4/5; 5/5 hip flexion (not knee extension or flex yet- still 4/5), ascending and descending stairs with improved balance, but hasn't been home to trial this in a few weeks; pt reports she is ascending/descending stairs at home without  difficulty, but continues to lean heavily into wall sometimes in her hallway if she is moving quickly and going around a corner from one room to the next Goal status: In progress  2.  Improve FGA score to >23 indicating decreased fall risk during ambulation Baseline: to be administered at 2nd visit; will modifiy this goal accordingly if needed Goal status: In progress  3.  Improve 5x STS time to <15 seconds indicating decreased fall risk in population age >64 y/o Baseline: 15; 04/27/23: 9 seconds best trial, 11 seconds other trial Goal status: MET    4. Improve ABC score >13 points (> 88%) indicating reduced risk for falls   Baseline 06/10/23: 74%; 6/30:81%   Goal status: In progress  ASSESSMENT:  CLINICAL IMPRESSION: Pt remains very motivated to participate in PT; arrived with report of fatigue associated with a few consecutive days at the pool working on exercises.  Focused session today towards more of an emphasis on dynamic balance activities rather than LE strength/endurance training. Reviewed benefit of working on gaze stabilization exercise at home to increase frequency of this neuro-re education activity.  Had a few episodes of feet crossing midline during dynamic gait activities in the hall involving head turns with eyes fixed on ball horizontally and in D2 patterns.  Would benefit from continuing with her pool exercise program she did this week at her community pool.  Overall, making progress towards PT goals; Likely not yet met max benefit from skilled PT, recommend continuing tx to address strength/balance/gait impairments to improve safety during her activities indoors  and outdoors at home and to reduce fall risk.  OBJECTIVE IMPAIRMENTS: Abnormal gait, decreased activity tolerance, decreased balance, decreased endurance, decreased strength, and impaired perceived functional ability.   ACTIVITY LIMITATIONS: standing, transfers, and locomotion level  PARTICIPATION LIMITATIONS:  cleaning, interpersonal relationship, shopping, community activity, and yard work  PERSONAL FACTORS: Age, Time since onset of injury/illness/exacerbation, and 1-2 comorbidities: long COVID, orthopedic hx (see above) are also affecting patient's functional outcome.   REHAB POTENTIAL: Good  CLINICAL DECISION MAKING: Evolving/moderate complexity  EVALUATION COMPLEXITY: Moderate  PLAN:  PT FREQUENCY: 2x/week  PT DURATION: 6 weeks  PLANNED INTERVENTIONS: 97110-Therapeutic exercises, 97530- Therapeutic activity, 97112- Neuromuscular re-education, 97535- Self Care, and [id number]- Manual therapy  PLAN FOR NEXT SESSION: continue incorporating gaze stabilization exercises into tx, dynamic balance activities/dual task balance activities  Vernell Reges, PT, DPT, OCS  Vernell FORBES Reges, PT 08/24/2023, 10:52 AM

## 2023-08-28 ENCOUNTER — Ambulatory Visit: Attending: Internal Medicine

## 2023-08-28 DIAGNOSIS — R2681 Unsteadiness on feet: Secondary | ICD-10-CM | POA: Diagnosis present

## 2023-08-28 DIAGNOSIS — R42 Dizziness and giddiness: Secondary | ICD-10-CM | POA: Diagnosis present

## 2023-08-28 DIAGNOSIS — R2689 Other abnormalities of gait and mobility: Secondary | ICD-10-CM | POA: Insufficient documentation

## 2023-08-28 NOTE — Therapy (Signed)
 OUTPATIENT PHYSICAL THERAPY NEURO TREATMENT/RE-CERT THROUGH 09/07/23   Patient Name: Amber Richardson MRN: [id number] DOB:[birth year], 68 y.o., female Today's Date: 08/28/2023  PCP: Dr. Glendia, MD  REFERRING PROVIDER: same  END OF SESSION:  PT End of Session - 08/28/23 1022     Visit Number 31    Number of Visits 46    Date for PT Re-Evaluation 09/07/23    Authorization Type 2x/week x 6 weeks (PN done on recert on 07/27/23)    Authorization Time Period Medicare, PN needed at visit #36    PT Start Time 1020    PT Stop Time 1100    PT Time Calculation (min) 40 min    Equipment Utilized During Treatment Gait belt    Activity Tolerance Patient tolerated treatment well    Behavior During Therapy [facility] for tasks assessed/performed              Past Medical History:  Diagnosis Date   Allergies    Basal cell carcinoma    Fatigue 08/30/2014   Generalized anxiety disorder 03/21/2011   GERD (gastroesophageal reflux disease)    History of COVID-19 02/22/2019   HLD (hyperlipidemia)    HSV infection 09/20/2014   Hypercholesterolemia 02/08/2015   Hyperglycemia 08/28/2019   Hyperthyroidism    Major depressive disorder    Osteoporosis 05/13/2015   Pyelonephritis 1985   Scalp, arm, and leg tingling 08/28/2019   Tendinosis 09/12/2013   Rotator cuff tendinosis. Mild capsulitis.   Tremor 08/28/2019   Subtle to mild, upper extremities, left worse than right   Past Surgical History:  Procedure Laterality Date   DILATION AND CURETTAGE OF UTERUS     Miscarriage     x1   TONSILLECTOMY  1963   Patient Active Problem List   Diagnosis Date Noted   Head injury 06/02/2023   Balance problem 01/02/2023   Poison ivy dermatitis 09/26/2022   Urinary frequency 09/26/2022   Abnormal liver function tests 08/22/2022   Flank pain 08/22/2022   Cold intolerance 08/11/2022   Concussion 08/11/2022   Diarrhea 03/26/2022   Headache 01/29/2022   Melanoma in situ (HCC) 06/08/2021   Contact  dermatitis and eczema due to plant 05/31/2021   Healthcare maintenance 11/11/2020   Rib pain on right side 09/30/2020   Joint pain 09/19/2020   Chest congestion 06/17/2020   Joint stiffness 06/17/2020   Change in vision 06/17/2020   Unsteady gait 06/17/2020   Moderate episode of recurrent major depressive disorder (HCC) 06/17/2020   Muscle weakness 06/13/2020   Stress 11/14/2019   Breast cancer screening 11/14/2019   Major depressive disorder    Disorder of bursae of shoulder region 09/01/2019   Knee pain 09/01/2019   Low back pain 09/01/2019   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   Tremor 08/28/2019   Hyperglycemia 08/28/2019   History of COVID-19 02/20/2019   Left arm pain 01/09/2019   Neck pain 09/04/2018   Dizziness 12/19/2017   Dyspnea 09/13/2017   Left facial numbness 06/14/2015   Osteoporosis 05/13/2015   Hypercholesterolemia 02/08/2015   Left foot pain 11/04/2014   Atrophic vaginitis 09/20/2014   HSV infection 09/20/2014   Pelvic pain in female 09/20/2014   Fatigue 08/30/2014   Chest pain 08/30/2014   Abdominal pain 08/29/2014   Tendinosis 09/12/2013   Basal cell carcinoma 03/17/2012   Hyperthyroidism 03/17/2012   Fibroids 01/19/2012   BV (bacterial vaginosis) 01/05/2012   PMB (postmenopausal bleeding) 01/05/2012   Generalized anxiety disorder 03/21/2011   GERD (gastroesophageal reflux disease) 03/21/2011  ONSET DATE: 2021  REFERRING DIAG: R26.89 (ICD-10-CM) - Balance problem   THERAPY DIAG:  Unsteadiness on feet  Balance problem  Dizziness and giddiness  Rationale for Evaluation and Treatment: Rehabilitation  SUBJECTIVE: From initial evaluation note 03/02/23                                                                                                                                                                                            SUBJECTIVE STATEMENT: Pt states she is having difficulty with her balance.  She has been noticing it for several  months.  It is progessively worsening.  She feels unsteady on her feet at times while walking and has to lean into the wall or reach for support.  She attributes the balance changes to her long COVID recovery process.  She is not falling to floor.  But when she stands up and starts moving example: walking towards a door and then leans on the door or leans on the wall for balance; sometimes she is weaving when she ambulates.  She notices she veers to one side more than the other.  She has a consult with [facility] in March 2025 for a consult.  She had seen a neurologist earlier in the long COVID recovery process.  She has also seen her cardiologist and r/o cardiac reasons.    She has not been able to exercise a lot since COVID.  She still has sort term memory issues too.  She does walk 1.5 miles- flat neighborhood; she has not done any strength training.  If she overdoes it she will have a flare up for 2 days.    She is not lightheaded, no dizziness noted.  Pt accompanied by: self  PERTINENT HISTORY: Jan 2021- had COVID and long COVID sx; spent 6 months lying bed, had a tough recovery; has had exploratory R ankle arthroscopy, no other LE  or spine injuries or surgeries.  Retired Teacher, early years/pre   PAIN:  Are you having pain? No  PRECAUTIONS: Fall  RED FLAGS: None   WEIGHT BEARING RESTRICTIONS: No  FALLS: Has patient fallen in last 6 months? No, but catches herself with the wall/doorway  LIVING ENVIRONMENT: Lives with: lives with their spouse Lives in: House/apartment Stairs: yes, 3 story home- she is concerned about her balance going down the stairs- does not feel stable and leans to the R when going down the stairs; sometimes has R ankle pain while going down the stairs- has rail on the L but not on the R; has 3 steps to enter Has following equipment at home: None  PLOF: Independent  PATIENT GOALS: to  improve balance  OBJECTIVE:  Note: Objective measures were completed at  Evaluation unless otherwise noted. (Portions deferred secondary to time constraints as pt arrived late to appointment)  COGNITION: Overall cognitive status: Within functional limits for tasks assessed   SENSATION: Not tested  COORDINATION: deferred  POSTURE: rounded shoulders  LOWER EXTREMITY ROM:   functionally able to perform sit to stand independently  Active  Right Eval Left Eval  Hip flexion    Hip extension    Hip abduction    Hip adduction    Hip internal rotation    Hip external rotation    Knee flexion    Knee extension    Ankle dorsiflexion    Ankle plantarflexion    Ankle inversion    Ankle eversion     (Blank rows = not tested)  LOWER EXTREMITY MMT:    MMT Right Eval Left Eval  Hip flexion 4 4  Hip extension 4 4  Hip abduction    Hip adduction    Hip internal rotation    Hip external rotation    Knee flexion 4 4  Knee extension 4 4  Ankle dorsiflexion    Ankle plantarflexion    Ankle inversion    Ankle eversion    (Blank rows = not tested)   GAIT:  Will further assess at next visit  FUNCTIONAL TESTS:  5 times sit to stand: 15 seconds SL balance: R x 20 seconds, L x 12 seconds  PATIENT SURVEYS:  FGA to be administered at second visit                                                                                                                              TREATMENT DATE:08/28/23 Subjective: pt has not had any falls.  Today, she was reading some papers and looking down and walking over surface changes with texture/color change and leaned into the wall, but not as hard as she typically has.  She feels she would have been able to step if she did not have the wall there.  Pain: 0/10  Objective: SLS balance R x 28 seconds, L x 18 seconds (was 20 and 12 at initial evaluation) LE MMT: hip abd R 4-/5, L 4/5 today Activities specific balance scale: 81%  Assessed Gr toe extension R and L at 80 deg extension Symmetrical MTP 2-5 extension AROM  b/l L MTP 2-5 flexion AROM 50% limited compared to R and MMT 4/5 L vs 5/5 R for toe flexion  Neuro re-ed: 25 min Amb in hallway head turns R/L 2 laps and up/down 2 laps  Amb in hallway 2 laps tossing ball R/L follow with eyes with PT  Amb in hallway watching ball in her hands- eyes on moving horizontal target, 2 laps R/L and 2 laps up/down, 2 laps D2 R/2 laps D2 L, 2 laps moving ball close/far  Standing D2 pattern squat with follow ball with eyes low to high R and  L x 15 ea direction  Gaze stabilization (horizontal head turns) 20-30 sec intervals, static standing, feet together standing, standing on airex feet together, and forward walk + gaze stabilization dual tasking: standing on airex today with feet together 3x  3 way lunges on blue mat (for balance challenge)- forward, lateral, reverse- 6 laps ea side   Tandem walk Holding dynadisc to block view: step ups on bosu, alternating foot taps, lateral step over hurdle (12 inch), and front step ups RLLR and LRRL  Amb in hallway looking at ball with head turns R and L and PT moving solid colored disc in front of patient too- 4 laps (close supervision)   Not today: Tandem walk 4x20 ft with eyes focused on various targets, not looking at feet - on airex- not today Lateral hurdles outside parallel bars without UE support 4 laps R/L- not today Standing alternating toe taps on cones: 1 min intervals, not looking at feet Single leg balance: 3x ea LE with eyes looking forward 20-30 second intervals ea Sit to stand from physioball x10- not today 60 lb resisted trunk walk outs forward/back, right, left with turning head R/L- 8x ea scenario- not today Sit to stand with head turn to R or L target on wall x10, 2 sets ea direction- not today Sit to stand with head turn + initiating few steps to R or L combined x 8, 3 rounds ea direction; then added intervals of weaving figure 8 in/out of cones x 4; then added intervals of stepping over hurdles/various  heights and obstacles x4; then added carrying box- not today   Therapeutic Activities: 15 min Squats: 2x12 to mat table Step ups onto bosu: forward, alternating x 15 ea, step up and overs x15 ea direction Step over hurdles- lateral, not looking at feet (12 inch height) x 1 min, 2 rounds Forward step over hurdles- mixed 12 and 16 inch height without looking at feet- 4 laps Transfer standing to floor via quadruped 8x, used blue mat- worked on for functional activity as she has to assist her brother with putting on an ankle brace daily; practiced this for balance/safety; also practiced as a fall recovery technique strategy; discussed option to use furniture/firm chair to push up with 1 UE from tall kneeling too if she is feeling a bit fatigued- pt able to perform all versions today (PT supervision/tactile, verbal, demonstration cues and instructions with pt gait belt)  Sit to stand holding 5 lb and walk in small CW or CWW circle around chair then sit down without hands- 8 reps ea direction  (Not today) Obstacle course: stepping over/around various heights, squat lift green physioball floor to waist, turn to R or L and put behind back, and 15 wall squats with physioball behind, set ball down, turn to R or L to return over obstacles.  Practiced multiple trials x 10 min, to simulate activities in home like bending to lift something and turning directions to carry it while also integrating LE strengthening with squat  Added carrying box (shoe box in hands) for dual tasking while completing sit to stand with 180 deg turn to walk around a corner to R/L while holding something at home like a functional activity she describes having difficulty with at home and stepping around cones/over obstacles, having to scan environment with eyes for safety (PT supervision with gait belt)   Therapeutic Exercise: not today Squats 2x10 holding 10 lb med ball Star lunge: R and L, focused on anterior/diagonal/lateral then  posterior/diagonal/lateral  5 rounds ea, ea LE Heel raises x20, 2 sets Toe raises x20, 2 sets Bosu: step ups- RLLR, and LRRL x15 ea Towel curls L foot x 1 min- not today Self toe flexion stretch L x 30 seconds, 2 reps- not today Nustep x 6 min for LE strength level 5- not today  (Not today) Lateral band walk with blue TB at distal thighs, 4 laps x 20 ft Ascend/descend stairs: using single railing, x 4 laps- not today Stepping over obstacles: 4 laps in parallel bars (multi height hurdles)- not today Heel raises: 2x10 5# ankle weights- not today Toe raises: 2x10 5# ankle weights- not today Front step ups on 6 inch step: LRRL, RLLR x10 ea- not today Standing hamstring curl: 5# 2x12 Seated knee extension: 5# 2x12 Deadlifts: 2x9# dumbbells, 2x10- not today  HEP instruction- see below  PATIENT EDUCATION: Education details: PT POC/tx for balance and strength deficits, pt ed for benefits of continuing with her pool exercises while community pool is open for the remainder of the summer Person educated: Patient Education method: Explanation Education comprehension: verbalized understanding  HOME EXERCISE PROGRAM: Access Code: [email or web address] URL: [email or web address] Date: 03/09/2023 Prepared by: Vernell Reges  Exercises - Sit to Stand Without Arm Support  - 1 x daily - 7 x weekly - 4 sets - 5 reps - Tandem Walking with Counter Support  - 1 x daily - 7 x weekly - 4 sets - Single Leg Stance  - 1 x daily - 7 x weekly - 3-5 reps - 15 hold - Seated Gaze Stabilization with Head Nod  - 1 x daily - 7 x weekly - 30 reps - 30 sec hold - Seated Gaze Stabilization with Head Rotation  - 1 x daily - 7 x weekly - 3 reps - 30 sec hold   GOALS: Goals reviewed with patient? Yes  SHORT TERM GOALS: Target date: 09/07/23  Pt will demonstrate ability to perform HEP for LE strengthening and balance exercises with proper technique/form in clinic Baseline: pt is not formally performing strength  exercises currently at home; 04/27/23 in progress Goal status: In progress  LONG TERM GOALS: Target date: 09/07/23  Improve LE strength 1/2 MMT grade to facilitate improved ability to ambulate down her hallway at home and descend/ascend stairs in her home without losing balance Baseline: 4/5; 5/5 hip flexion (not knee extension or flex yet- still 4/5), ascending and descending stairs with improved balance, but hasn't been home to trial this in a few weeks; pt reports she is ascending/descending stairs at home without difficulty, but continues to lean heavily into wall sometimes in her hallway if she is moving quickly and going around a corner from one room to the next Goal status: In progress  2.  Improve FGA score to >23 indicating decreased fall risk during ambulation Baseline: to be administered at 2nd visit; will modifiy this goal accordingly if needed Goal status: In progress  3.  Improve 5x STS time to <15 seconds indicating decreased fall risk in population age >66 y/o Baseline: 15; 04/27/23: 9 seconds best trial, 11 seconds other trial Goal status: MET    4. Improve ABC score >13 points (> 88%) indicating reduced risk for falls   Baseline 06/10/23: 74%; 6/30:81%   Goal status: In progress  ASSESSMENT:  CLINICAL IMPRESSION: Pt remains very motivated to participate in PT; arrived with report of fatigue associated with a few consecutive days at the pool working on exercises.  Focused session today towards more of an  emphasis on dynamic balance activities rather than LE strength/endurance training. Reviewed benefit of working on gaze stabilization exercise at home to increase frequency of this neuro-re education activity.  Overall she amb with improved gait stability during dynamic balance and dual tasking activities.  Practiced stepping strategies without relying on visual cues looking at feet today and pt only caught foot on hurdle a few times- no major loss of balance.  Also she  demonstrated good balance strategies with ankle/hip/trunk movements on bosu today.  Overall, making progress towards PT goals; Likely not yet met max benefit from skilled PT, recommend continuing tx to address strength/balance/gait impairments to improve safety during her activities indoors and outdoors at home and to reduce fall risk.  OBJECTIVE IMPAIRMENTS: Abnormal gait, decreased activity tolerance, decreased balance, decreased endurance, decreased strength, and impaired perceived functional ability.   ACTIVITY LIMITATIONS: standing, transfers, and locomotion level  PARTICIPATION LIMITATIONS: cleaning, interpersonal relationship, shopping, community activity, and yard work  PERSONAL FACTORS: Age, Time since onset of injury/illness/exacerbation, and 1-2 comorbidities: long COVID, orthopedic hx (see above) are also affecting patient's functional outcome.   REHAB POTENTIAL: Good  CLINICAL DECISION MAKING: Evolving/moderate complexity  EVALUATION COMPLEXITY: Moderate  PLAN:  PT FREQUENCY: 2x/week  PT DURATION: 6 weeks  PLANNED INTERVENTIONS: 97110-Therapeutic exercises, 97530- Therapeutic activity, 97112- Neuromuscular re-education, 97535- Self Care, and [id number]- Manual therapy  PLAN FOR NEXT SESSION: continue incorporating gaze stabilization exercises into tx, dynamic balance activities/dual task balance activities  Vernell Reges, PT, DPT, OCS  Vernell FORBES Reges, PT 08/28/2023, 11:05 AM

## 2023-08-31 ENCOUNTER — Ambulatory Visit

## 2023-09-01 ENCOUNTER — Ambulatory Visit

## 2023-09-01 DIAGNOSIS — R2681 Unsteadiness on feet: Secondary | ICD-10-CM

## 2023-09-01 DIAGNOSIS — R42 Dizziness and giddiness: Secondary | ICD-10-CM

## 2023-09-01 DIAGNOSIS — R2689 Other abnormalities of gait and mobility: Secondary | ICD-10-CM

## 2023-09-01 NOTE — Therapy (Signed)
 OUTPATIENT PHYSICAL THERAPY NEURO TREATMENT/RE-CERT THROUGH 09/07/23   Patient Name: Amber Richardson MRN: [id number] DOB:August 20, 1955, 68 y.o., female Today's Date: 09/01/2023  PCP: Dr. Glendia, MD  REFERRING PROVIDER: same  END OF SESSION:  PT End of Session - 09/01/23 1423     Visit Number 32    Number of Visits 46    Date for PT Re-Evaluation 09/07/23    Authorization Type 2x/week x 6 weeks (PN done on recert on 07/27/23)    Authorization Time Period Medicare, PN needed at visit #36    PT Start Time 1425    PT Stop Time 1505    PT Time Calculation (min) 40 min    Equipment Utilized During Treatment Gait belt    Activity Tolerance Patient tolerated treatment well    Behavior During Therapy [facility] for tasks assessed/performed              Past Medical History:  Diagnosis Date   Allergies    Basal cell carcinoma    Fatigue 08/30/2014   Generalized anxiety disorder 03/21/2011   GERD (gastroesophageal reflux disease)    History of COVID-19 02/22/2019   HLD (hyperlipidemia)    HSV infection 09/20/2014   Hypercholesterolemia 02/08/2015   Hyperglycemia 08/28/2019   Hyperthyroidism    Major depressive disorder    Osteoporosis 05/13/2015   Pyelonephritis 1985   Scalp, arm, and leg tingling 08/28/2019   Tendinosis 09/12/2013   Rotator cuff tendinosis. Mild capsulitis.   Tremor 08/28/2019   Subtle to mild, upper extremities, left worse than right   Past Surgical History:  Procedure Laterality Date   DILATION AND CURETTAGE OF UTERUS     Miscarriage     x1   TONSILLECTOMY  1963   Patient Active Problem List   Diagnosis Date Noted   Head injury 06/02/2023   Balance problem 01/02/2023   Poison ivy dermatitis 09/26/2022   Urinary frequency 09/26/2022   Abnormal liver function tests 08/22/2022   Flank pain 08/22/2022   Cold intolerance 08/11/2022   Concussion 08/11/2022   Diarrhea 03/26/2022   Headache 01/29/2022   Melanoma in situ (HCC) 06/08/2021   Contact  dermatitis and eczema due to plant 05/31/2021   Healthcare maintenance 11/11/2020   Rib pain on right side 09/30/2020   Joint pain 09/19/2020   Chest congestion 06/17/2020   Joint stiffness 06/17/2020   Change in vision 06/17/2020   Unsteady gait 06/17/2020   Moderate episode of recurrent major depressive disorder (HCC) 06/17/2020   Muscle weakness 06/13/2020   Stress 11/14/2019   Breast cancer screening 11/14/2019   Major depressive disorder    Disorder of bursae of shoulder region 09/01/2019   Knee pain 09/01/2019   Low back pain 09/01/2019   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   Tremor 08/28/2019   Hyperglycemia 08/28/2019   History of COVID-19 02/20/2019   Left arm pain 01/09/2019   Neck pain 09/04/2018   Dizziness 12/19/2017   Dyspnea 09/13/2017   Left facial numbness 06/14/2015   Osteoporosis 05/13/2015   Hypercholesterolemia 02/08/2015   Left foot pain 11/04/2014   Atrophic vaginitis 09/20/2014   HSV infection 09/20/2014   Pelvic pain in female 09/20/2014   Fatigue 08/30/2014   Chest pain 08/30/2014   Abdominal pain 08/29/2014   Tendinosis 09/12/2013   Basal cell carcinoma 03/17/2012   Hyperthyroidism 03/17/2012   Fibroids 01/19/2012   BV (bacterial vaginosis) 01/05/2012   PMB (postmenopausal bleeding) 01/05/2012   Generalized anxiety disorder 03/21/2011   GERD (gastroesophageal reflux disease) 03/21/2011  ONSET DATE: 2021  REFERRING DIAG: R26.89 (ICD-10-CM) - Balance problem   THERAPY DIAG:  Unsteadiness on feet  Balance problem  Dizziness and giddiness  Rationale for Evaluation and Treatment: Rehabilitation  SUBJECTIVE: From initial evaluation note 03/02/23                                                                                                                                                                                            SUBJECTIVE STATEMENT: Pt states she is having difficulty with her balance.  She has been noticing it for several  months.  It is progessively worsening.  She feels unsteady on her feet at times while walking and has to lean into the wall or reach for support.  She attributes the balance changes to her long COVID recovery process.  She is not falling to floor.  But when she stands up and starts moving example: walking towards a door and then leans on the door or leans on the wall for balance; sometimes she is weaving when she ambulates.  She notices she veers to one side more than the other.  She has a consult with [facility] in March 2025 for a consult.  She had seen a neurologist earlier in the long COVID recovery process.  She has also seen her cardiologist and r/o cardiac Richardson.    She has not been able to exercise a lot since COVID.  She still has sort term memory issues too.  She does walk 1.5 miles- flat neighborhood; she has not done any strength training.  If she overdoes it she will have a flare up for 2 days.    She is not lightheaded, no dizziness noted.  Pt accompanied by: self  PERTINENT HISTORY: Jan 2021- had COVID and long COVID sx; spent 6 months lying bed, had a tough recovery; has had exploratory R ankle arthroscopy, no other LE  or spine injuries or surgeries.  Retired Teacher, early years/pre   PAIN:  Are you having pain? No  PRECAUTIONS: Fall  RED FLAGS: None   WEIGHT BEARING RESTRICTIONS: No  FALLS: Has patient fallen in last 6 months? No, but catches herself with the wall/doorway  LIVING ENVIRONMENT: Lives with: lives with their spouse Lives in: House/apartment Stairs: yes, 3 story home- she is concerned about her balance going down the stairs- does not feel stable and leans to the R when going down the stairs; sometimes has R ankle pain while going down the stairs- has rail on the L but not on the R; has 3 steps to enter Has following equipment at home: None  PLOF: Independent  PATIENT GOALS: to  improve balance  OBJECTIVE:  Note: Objective measures were completed at  Evaluation unless otherwise noted. (Portions deferred secondary to time constraints as pt arrived late to appointment)  COGNITION: Overall cognitive status: Within functional limits for tasks assessed   SENSATION: Not tested  COORDINATION: deferred  POSTURE: rounded shoulders  LOWER EXTREMITY ROM:   functionally able to perform sit to stand independently  Active  Right Eval Left Eval  Hip flexion    Hip extension    Hip abduction    Hip adduction    Hip internal rotation    Hip external rotation    Knee flexion    Knee extension    Ankle dorsiflexion    Ankle plantarflexion    Ankle inversion    Ankle eversion     (Blank rows = not tested)  LOWER EXTREMITY MMT:    MMT Right Eval Left Eval  Hip flexion 4 4  Hip extension 4 4  Hip abduction    Hip adduction    Hip internal rotation    Hip external rotation    Knee flexion 4 4  Knee extension 4 4  Ankle dorsiflexion    Ankle plantarflexion    Ankle inversion    Ankle eversion    (Blank rows = not tested)   GAIT:  Will further assess at next visit  FUNCTIONAL TESTS:  5 times sit to stand: 15 seconds SL balance: R x 20 seconds, L x 12 seconds  PATIENT SURVEYS:  FGA to be administered at second visit                                                                                                                              TREATMENT DATE:09/01/23 Subjective: pt has not had any falls.  Today, she was reading some papers and looking down and walking over surface changes with texture/color change and leaned into the wall, but not as hard as she typically has.  She feels she would have been able to step if she did not have the wall there.    Pain: 0/10  Objective: SLS balance R x 28 seconds, L x 18 seconds (was 20 and 12 at initial evaluation) LE MMT: hip abd R 4-/5, L 4/5 today Activities specific balance scale: 81%  Assessed Gr toe extension R and L at 80 deg extension Symmetrical MTP 2-5 extension  AROM b/l L MTP 2-5 flexion AROM 50% limited compared to R and MMT 4/5 L vs 5/5 R for toe flexion  Neuro re-ed: 25 min Amb in hallway head turns R/L 2 laps and up/down 2 laps  Amb in hallway 2 laps tossing ball R/L follow with eyes with PT  Amb in hallway watching ball in her hands- eyes on moving horizontal target, 2 laps R/L and 2 laps up/down, 2 laps D2 R/2 laps D2 L, 2 laps moving ball close/far  Standing D2 pattern squat with follow ball with eyes low to high  R and L x 15 ea direction  Gaze stabilization (horizontal head turns) 20-30 sec intervals, static standing, feet together standing, standing on airex feet together, and forward walk + gaze stabilization dual tasking: standing on airex today with feet together 3x  3 way lunges on blue mat (for balance challenge)- forward, lateral, reverse- 6 laps ea side   Tandem walk Holding dynadisc to block view: step ups on bosu, alternating foot taps, lateral step over hurdle (12 inch), and front step ups RLLR and LRRL  Amb in hallway looking at ball with head turns R and L and PT moving solid colored disc in front of patient too- 4 laps (close supervision)   Not today: Tandem walk 4x20 ft with eyes focused on various targets, not looking at feet - on airex- not today Lateral hurdles outside parallel bars without UE support 4 laps R/L- not today Standing alternating toe taps on cones: 1 min intervals, not looking at feet Single leg balance: 3x ea LE with eyes looking forward 20-30 second intervals ea Sit to stand from physioball x10- not today 60 lb resisted trunk walk outs forward/back, right, left with turning head R/L- 8x ea scenario- not today Sit to stand with head turn to R or L target on wall x10, 2 sets ea direction- not today Sit to stand with head turn + initiating few steps to R or L combined x 8, 3 rounds ea direction; then added intervals of weaving figure 8 in/out of cones x 4; then added intervals of stepping over  hurdles/various heights and obstacles x4; then added carrying box- not today   Therapeutic Activities: 15 min Squats: 2x12 to mat table Step ups onto bosu: forward, alternating x 15 ea, step up and overs x15 ea direction Step over hurdles- lateral, not looking at feet (12 inch height) x 1 min, 2 rounds Forward step over hurdles- mixed 12 and 16 inch height without looking at feet- 4 laps Transfer standing to floor via quadruped 8x, used blue mat- worked on for functional activity as she has to assist her brother with putting on an ankle brace daily; practiced this for balance/safety; also practiced as a fall recovery technique strategy; discussed option to use furniture/firm chair to push up with 1 UE from tall kneeling too if she is feeling a bit fatigued- pt able to perform all versions today (PT supervision/tactile, verbal, demonstration cues and instructions with pt gait belt)  Sit to stand holding 5 lb and walk in small CW or CWW circle around chair then sit down without hands- 8 reps ea direction  (Not today) Obstacle course: stepping over/around various heights, squat lift green physioball floor to waist, turn to R or L and put behind back, and 15 wall squats with physioball behind, set ball down, turn to R or L to return over obstacles.  Practiced multiple trials x 10 min, to simulate activities in home like bending to lift something and turning directions to carry it while also integrating LE strengthening with squat  Added carrying box (shoe box in hands) for dual tasking while completing sit to stand with 180 deg turn to walk around a corner to R/L while holding something at home like a functional activity she describes having difficulty with at home and stepping around cones/over obstacles, having to scan environment with eyes for safety (PT supervision with gait belt)   Therapeutic Exercise: not today Squats 2x10 holding 10 lb med ball Star lunge: R and L, focused on  anterior/diagonal/lateral  then posterior/diagonal/lateral 5 rounds ea, ea LE Heel raises x20, 2 sets Toe raises x20, 2 sets Bosu: step ups- RLLR, and LRRL x15 ea Towel curls L foot x 1 min- not today Self toe flexion stretch L x 30 seconds, 2 reps- not today Nustep x 6 min for LE strength level 5- not today  (Not today) Lateral band walk with blue TB at distal thighs, 4 laps x 20 ft Ascend/descend stairs: using single railing, x 4 laps- not today Stepping over obstacles: 4 laps in parallel bars (multi height hurdles)- not today Heel raises: 2x10 5# ankle weights- not today Toe raises: 2x10 5# ankle weights- not today Front step ups on 6 inch step: LRRL, RLLR x10 ea- not today Standing hamstring curl: 5# 2x12 Seated knee extension: 5# 2x12 Deadlifts: 2x9# dumbbells, 2x10- not today  HEP instruction- see below  PATIENT EDUCATION: Education details: PT POC/tx for balance and strength deficits, pt ed for benefits of continuing with her pool exercises while community pool is open for the remainder of the summer Person educated: Patient Education method: Explanation Education comprehension: verbalized understanding  HOME EXERCISE PROGRAM: Access Code: [email or web address] URL: [email or web address] Date: 03/09/2023 Prepared by: Vernell Reges  Exercises - Sit to Stand Without Arm Support  - 1 x daily - 7 x weekly - 4 sets - 5 reps - Tandem Walking with Counter Support  - 1 x daily - 7 x weekly - 4 sets - Single Leg Stance  - 1 x daily - 7 x weekly - 3-5 reps - 15 hold - Seated Gaze Stabilization with Head Nod  - 1 x daily - 7 x weekly - 30 reps - 30 sec hold - Seated Gaze Stabilization with Head Rotation  - 1 x daily - 7 x weekly - 3 reps - 30 sec hold   GOALS: Goals reviewed with patient? Yes  SHORT TERM GOALS: Target date: 09/07/23  Pt will demonstrate ability to perform HEP for LE strengthening and balance exercises with proper technique/form in clinic Baseline: pt is  not formally performing strength exercises currently at home; 04/27/23 in progress Goal status: In progress  LONG TERM GOALS: Target date: 09/07/23  Improve LE strength 1/2 MMT grade to facilitate improved ability to ambulate down her hallway at home and descend/ascend stairs in her home without losing balance Baseline: 4/5; 5/5 hip flexion (not knee extension or flex yet- still 4/5), ascending and descending stairs with improved balance, but hasn't been home to trial this in a few weeks; pt reports she is ascending/descending stairs at home without difficulty, but continues to lean heavily into wall sometimes in her hallway if she is moving quickly and going around a corner from one room to the next Goal status: In progress  2.  Improve FGA score to >23 indicating decreased fall risk during ambulation Baseline: to be administered at 2nd visit; will modifiy this goal accordingly if needed Goal status: In progress  3.  Improve 5x STS time to <15 seconds indicating decreased fall risk in population age >24 y/o Baseline: 15; 04/27/23: 9 seconds best trial, 11 seconds other trial Goal status: MET    4. Improve ABC score >13 points (> 88%) indicating reduced risk for falls   Baseline 06/10/23: 74%; 6/30:81%   Goal status: In progress  ASSESSMENT:  CLINICAL IMPRESSION: Pt remains very motivated to participate in PT; arrived with report of fatigue associated with a few consecutive days at the pool working on exercises.  Focused session today towards more  of an emphasis on dynamic balance activities rather than LE strength/endurance training. Reviewed benefit of working on gaze stabilization exercise at home to increase frequency of this neuro-re education activity.  Overall she amb with improved gait stability during dynamic balance and dual tasking activities.  Practiced stepping strategies without relying on visual cues looking at feet today and pt only caught foot on hurdle a few times- no major loss  of balance.  Also she demonstrated good balance strategies with ankle/hip/trunk movements on bosu today.  Overall, making progress towards PT goals; Likely not yet met max benefit from skilled PT, recommend continuing tx to address strength/balance/gait impairments to improve safety during her activities indoors and outdoors at home and to reduce fall risk.  OBJECTIVE IMPAIRMENTS: Abnormal gait, decreased activity tolerance, decreased balance, decreased endurance, decreased strength, and impaired perceived functional ability.   ACTIVITY LIMITATIONS: standing, transfers, and locomotion level  PARTICIPATION LIMITATIONS: cleaning, interpersonal relationship, shopping, community activity, and yard work  PERSONAL FACTORS: Age, Time since onset of injury/illness/exacerbation, and 1-2 comorbidities: long COVID, orthopedic hx (see above) are also affecting patient's functional outcome.   REHAB POTENTIAL: Good  CLINICAL DECISION MAKING: Evolving/moderate complexity  EVALUATION COMPLEXITY: Moderate  PLAN:  PT FREQUENCY: 2x/week  PT DURATION: 6 weeks  PLANNED INTERVENTIONS: 97110-Therapeutic exercises, 97530- Therapeutic activity, 97112- Neuromuscular re-education, 97535- Self Care, and [id number]- Manual therapy  PLAN FOR NEXT SESSION: continue incorporating gaze stabilization exercises into tx, dynamic balance activities/dual task balance activities  Vernell Reges, PT, DPT, OCS  Vernell FORBES Reges, PT 09/01/2023, 5:00 PM

## 2023-09-02 ENCOUNTER — Ambulatory Visit

## 2023-09-03 ENCOUNTER — Encounter

## 2023-09-04 ENCOUNTER — Ambulatory Visit

## 2023-09-04 DIAGNOSIS — R2681 Unsteadiness on feet: Secondary | ICD-10-CM

## 2023-09-04 DIAGNOSIS — R2689 Other abnormalities of gait and mobility: Secondary | ICD-10-CM

## 2023-09-04 DIAGNOSIS — R42 Dizziness and giddiness: Secondary | ICD-10-CM

## 2023-09-04 NOTE — Therapy (Signed)
 OUTPATIENT PHYSICAL THERAPY NEURO TREATMENT/RE-CERT THROUGH 09/07/23   Patient Name: Amber Richardson MRN: [id number] DOB:Apr 09, 1955, 68 y.o., female Today's Date: 09/04/2023  PCP: Dr. Glendia, MD  REFERRING PROVIDER: same  END OF SESSION:  PT End of Session - 09/04/23 1109     Visit Number 33    Number of Visits 46    Date for PT Re-Evaluation 09/07/23    Authorization Type 2x/week x 6 weeks (PN done on recert on 07/27/23)    Authorization Time Period Medicare, PN needed at visit #36    PT Start Time 1100    PT Stop Time 1145    PT Time Calculation (min) 45 min    Equipment Utilized During Treatment Gait belt    Activity Tolerance Patient tolerated treatment well    Behavior During Therapy [facility] for tasks assessed/performed              Past Medical History:  Diagnosis Date   Allergies    Basal cell carcinoma    Fatigue 08/30/2014   Generalized anxiety disorder 03/21/2011   GERD (gastroesophageal reflux disease)    History of COVID-19 02/22/2019   HLD (hyperlipidemia)    HSV infection 09/20/2014   Hypercholesterolemia 02/08/2015   Hyperglycemia 08/28/2019   Hyperthyroidism    Major depressive disorder    Osteoporosis 05/13/2015   Pyelonephritis 1985   Scalp, arm, and leg tingling 08/28/2019   Tendinosis 09/12/2013   Rotator cuff tendinosis. Mild capsulitis.   Tremor 08/28/2019   Subtle to mild, upper extremities, left worse than right   Past Surgical History:  Procedure Laterality Date   DILATION AND CURETTAGE OF UTERUS     Miscarriage     x1   TONSILLECTOMY  1963   Patient Active Problem List   Diagnosis Date Noted   Head injury 06/02/2023   Balance problem 01/02/2023   Poison ivy dermatitis 09/26/2022   Urinary frequency 09/26/2022   Abnormal liver function tests 08/22/2022   Flank pain 08/22/2022   Cold intolerance 08/11/2022   Concussion 08/11/2022   Diarrhea 03/26/2022   Headache 01/29/2022   Melanoma in situ (HCC) 06/08/2021   Contact  dermatitis and eczema due to plant 05/31/2021   Healthcare maintenance 11/11/2020   Rib pain on right side 09/30/2020   Joint pain 09/19/2020   Chest congestion 06/17/2020   Joint stiffness 06/17/2020   Change in vision 06/17/2020   Unsteady gait 06/17/2020   Moderate episode of recurrent major depressive disorder (HCC) 06/17/2020   Muscle weakness 06/13/2020   Stress 11/14/2019   Breast cancer screening 11/14/2019   Major depressive disorder    Disorder of bursae of shoulder region 09/01/2019   Knee pain 09/01/2019   Low back pain 09/01/2019   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   Tremor 08/28/2019   Hyperglycemia 08/28/2019   History of COVID-19 02/20/2019   Left arm pain 01/09/2019   Neck pain 09/04/2018   Dizziness 12/19/2017   Dyspnea 09/13/2017   Left facial numbness 06/14/2015   Osteoporosis 05/13/2015   Hypercholesterolemia 02/08/2015   Left foot pain 11/04/2014   Atrophic vaginitis 09/20/2014   HSV infection 09/20/2014   Pelvic pain in female 09/20/2014   Fatigue 08/30/2014   Chest pain 08/30/2014   Abdominal pain 08/29/2014   Tendinosis 09/12/2013   Basal cell carcinoma 03/17/2012   Hyperthyroidism 03/17/2012   Fibroids 01/19/2012   BV (bacterial vaginosis) 01/05/2012   PMB (postmenopausal bleeding) 01/05/2012   Generalized anxiety disorder 03/21/2011   GERD (gastroesophageal reflux disease) 03/21/2011  ONSET DATE: 2021  REFERRING DIAG: R26.89 (ICD-10-CM) - Balance problem   THERAPY DIAG:  Unsteadiness on feet  Balance problem  Dizziness and giddiness  Rationale for Evaluation and Treatment: Rehabilitation  SUBJECTIVE: From initial evaluation note 03/02/23                                                                                                                                                                                            SUBJECTIVE STATEMENT: Pt states she is having difficulty with her balance.  She has been noticing it for several  months.  It is progessively worsening.  She feels unsteady on her feet at times while walking and has to lean into the wall or reach for support.  She attributes the balance changes to her long COVID recovery process.  She is not falling to floor.  But when she stands up and starts moving example: walking towards a door and then leans on the door or leans on the wall for balance; sometimes she is weaving when she ambulates.  She notices she veers to one side more than the other.  She has a consult with [facility] in March 2025 for a consult.  She had seen a neurologist earlier in the long COVID recovery process.  She has also seen her cardiologist and r/o cardiac reasons.    She has not been able to exercise a lot since COVID.  She still has sort term memory issues too.  She does walk 1.5 miles- flat neighborhood; she has not done any strength training.  If she overdoes it she will have a flare up for 2 days.    She is not lightheaded, no dizziness noted.  Pt accompanied by: self  PERTINENT HISTORY: Jan 2021- had COVID and long COVID sx; spent 6 months lying bed, had a tough recovery; has had exploratory R ankle arthroscopy, no other LE  or spine injuries or surgeries.  Retired Teacher, early years/pre   PAIN:  Are you having pain? No  PRECAUTIONS: Fall  RED FLAGS: None   WEIGHT BEARING RESTRICTIONS: No  FALLS: Has patient fallen in last 6 months? No, but catches herself with the wall/doorway  LIVING ENVIRONMENT: Lives with: lives with their spouse Lives in: House/apartment Stairs: yes, 3 story home- she is concerned about her balance going down the stairs- does not feel stable and leans to the R when going down the stairs; sometimes has R ankle pain while going down the stairs- has rail on the L but not on the R; has 3 steps to enter Has following equipment at home: None  PLOF: Independent  PATIENT GOALS: to  improve balance  OBJECTIVE:  Note: Objective measures were completed at  Evaluation unless otherwise noted. (Portions deferred secondary to time constraints as pt arrived late to appointment)  COGNITION: Overall cognitive status: Within functional limits for tasks assessed   SENSATION: Not tested  COORDINATION: deferred  POSTURE: rounded shoulders  LOWER EXTREMITY ROM:   functionally able to perform sit to stand independently  Active  Right Eval Left Eval  Hip flexion    Hip extension    Hip abduction    Hip adduction    Hip internal rotation    Hip external rotation    Knee flexion    Knee extension    Ankle dorsiflexion    Ankle plantarflexion    Ankle inversion    Ankle eversion     (Blank rows = not tested)  LOWER EXTREMITY MMT:    MMT Right Eval Left Eval  Hip flexion 4 4  Hip extension 4 4  Hip abduction    Hip adduction    Hip internal rotation    Hip external rotation    Knee flexion 4 4  Knee extension 4 4  Ankle dorsiflexion    Ankle plantarflexion    Ankle inversion    Ankle eversion    (Blank rows = not tested)   GAIT:  Will further assess at next visit  FUNCTIONAL TESTS:  5 times sit to stand: 15 seconds SL balance: R x 20 seconds, L x 12 seconds  PATIENT SURVEYS:  FGA to be administered at second visit                                                                                                                              TREATMENT DATE:09/04/23 Subjective: pt has not had any falls since last session.  She has not leaned heavily into any door jams.  Felt a little shortness of breath from her long covid this morning, used her inhaler and is feeling better.  Pain: 0/10  Objective: SLS balance R x 28 seconds, L x 18 seconds (was 20 and 12 at initial evaluation) LE MMT: hip abd R 4-/5, L 4/5 today Activities specific balance scale: 81%  SpO2: 98% HR: 66 bpm at rest; 98 bpm during session Assessed Gr toe extension R and L at 80 deg extension Symmetrical MTP 2-5 extension AROM b/l L MTP 2-5 flexion  AROM 50% limited compared to R and MMT 4/5 L vs 5/5 R for toe flexion  Neuro re-ed: 25 min Amb in hallway head turns R/L 2 laps and up/down 2 laps  Amb in hallway 2 laps tossing ball R/L follow with eyes with PT  Amb in hallway watching ball in her hands- eyes on moving horizontal target, 2 laps R/L and 2 laps up/down, 2 laps D2 R/2 laps D2 L, 2 laps moving ball close/far  Standing D2 pattern squat with follow ball with eyes low to high R and L x 15 ea direction,  standing on airex cushion today  Gaze stabilization (horizontal head turns) 20-30 sec intervals, static standing, feet together standing, standing on airex feet together, 3x  3 way lunges in hallway today (for balance challenge)- forward, lateral, reverse- 6 laps ea side  Tandem walk holding 5 lb med ball in hallway x 25 feet  Amb in hallway looking at ball with head turns R and L and PT moving solid colored disc in front of patient too- 4 laps (close supervision)  Walking forward lunges x 20   Not today: Tandem walk 4x20 ft with eyes focused on various targets, not looking at feet - on airex- not today Lateral hurdles outside parallel bars without UE support 4 laps R/L- not today Standing alternating toe taps on cones: 1 min intervals, not looking at feet Single leg balance: 3x ea LE with eyes looking forward 20-30 second intervals ea Sit to stand from physioball x10- not today 60 lb resisted trunk walk outs forward/back, right, left with turning head R/L- 8x ea scenario- not today Sit to stand with head turn to R or L target on wall x10, 2 sets ea direction- not today Sit to stand with head turn + initiating few steps to R or L combined x 8, 3 rounds ea direction; then added intervals of weaving figure 8 in/out of cones x 4; then added intervals of stepping over hurdles/various heights and obstacles x4; then added carrying box- not today   Therapeutic Activities: 15 min Squats: 2x12 to mat table 5# ball Step ups onto  bosu: forward, alternating x 15 ea, step up and overs x15 ea direction holding 5# med ball Standing on bosu feet together 5# ball toss at trampoline x 20 Step over hurdles- lateral, not looking at feet (12 inch height) x 1 min, 2 rounds- holding ball and talking (dual tasking)- not today Forward step over hurdles- mixed 12 and 16 inch height without looking at feet- 4 laps Transfer standing to floor via quadruped 8x, used blue mat- worked on for functional activity as she has to assist her brother with putting on an ankle brace daily; practiced this for balance/safety; also practiced as a fall recovery technique strategy; discussed option to use furniture/firm chair to push up with 1 UE from tall kneeling too if she is feeling a bit fatigued- pt able to perform all versions today (PT supervision/tactile, verbal, demonstration cues and instructions with pt gait belt)- not today  Sit to stand holding 5 lb and walk in small CW or CWW circle around chair then sit down without hands- 8 reps ea direction  (Not today) Obstacle course: stepping over/around various heights, squat lift green physioball floor to waist, turn to R or L and put behind back, and 15 wall squats with physioball behind, set ball down, turn to R or L to return over obstacles.  Practiced multiple trials x 10 min, to simulate activities in home like bending to lift something and turning directions to carry it while also integrating LE strengthening with squat  Added carrying box (shoe box in hands) for dual tasking while completing sit to stand with 180 deg turn to walk around a corner to R/L while holding something at home like a functional activity she describes having difficulty with at home and stepping around cones/over obstacles, having to scan environment with eyes for safety (PT supervision with gait belt)   Therapeutic Exercise: not today Squats 2x10 holding 10 lb med ball Star lunge: R and L, focused on  anterior/diagonal/lateral then posterior/diagonal/lateral 5 rounds ea, ea LE Heel raises x20, 2 sets Toe raises x20, 2 sets Bosu: step ups- RLLR, and LRRL x15 ea Towel curls L foot x 1 min- not today Self toe flexion stretch L x 30 seconds, 2 reps- not today Nustep x 6 min for LE strength level 5- not today  (Not today) Lateral band walk with blue TB at distal thighs, 4 laps x 20 ft Ascend/descend stairs: using single railing, x 4 laps- not today Stepping over obstacles: 4 laps in parallel bars (multi height hurdles)- not today Heel raises: 2x10 5# ankle weights- not today Toe raises: 2x10 5# ankle weights- not today Front step ups on 6 inch step: LRRL, RLLR x10 ea- not today Standing hamstring curl: 5# 2x12 Seated knee extension: 5# 2x12 Deadlifts: 2x9# dumbbells, 2x10- not today  HEP instruction- see below  PATIENT EDUCATION: Education details: PT POC/tx for balance and strength deficits, pt ed for benefits of continuing with her pool exercises while community pool is open for the remainder of the summer Person educated: Patient Education method: Explanation Education comprehension: verbalized understanding  HOME EXERCISE PROGRAM: Access Code: [email or web address] URL: [email or web address] Date: 03/09/2023 Prepared by: Vernell Reges  Exercises - Sit to Stand Without Arm Support  - 1 x daily - 7 x weekly - 4 sets - 5 reps - Tandem Walking with Counter Support  - 1 x daily - 7 x weekly - 4 sets - Single Leg Stance  - 1 x daily - 7 x weekly - 3-5 reps - 15 hold - Seated Gaze Stabilization with Head Nod  - 1 x daily - 7 x weekly - 30 reps - 30 sec hold - Seated Gaze Stabilization with Head Rotation  - 1 x daily - 7 x weekly - 3 reps - 30 sec hold   GOALS: Goals reviewed with patient? Yes  SHORT TERM GOALS: Target date: 09/07/23  Pt will demonstrate ability to perform HEP for LE strengthening and balance exercises with proper technique/form in clinic Baseline: pt is  not formally performing strength exercises currently at home; 04/27/23 in progress Goal status: In progress  LONG TERM GOALS: Target date: 09/07/23  Improve LE strength 1/2 MMT grade to facilitate improved ability to ambulate down her hallway at home and descend/ascend stairs in her home without losing balance Baseline: 4/5; 5/5 hip flexion (not knee extension or flex yet- still 4/5), ascending and descending stairs with improved balance, but hasn't been home to trial this in a few weeks; pt reports she is ascending/descending stairs at home without difficulty, but continues to lean heavily into wall sometimes in her hallway if she is moving quickly and going around a corner from one room to the next Goal status: In progress  2.  Improve FGA score to >23 indicating decreased fall risk during ambulation Baseline: to be administered at 2nd visit; will modifiy this goal accordingly if needed Goal status: In progress  3.  Improve 5x STS time to <15 seconds indicating decreased fall risk in population age >107 y/o Baseline: 15; 04/27/23: 9 seconds best trial, 11 seconds other trial Goal status: MET    4. Improve ABC score >13 points (> 88%) indicating reduced risk for falls   Baseline 06/10/23: 74%; 6/30:81%   Goal status: In progress  ASSESSMENT:  CLINICAL IMPRESSION: Overall pt's balance is improving with standing and dynamic balance activities.  She demonstrated good balance strategies with ankle/hip/trunk movements on bosu and stepping strategies during session today.  Sp  O2% remained 98% today and HR responded as expected with exercise from resting HR 66 bpm to 98 bpm during exercise.  Pt reports no SOB during session.  Overall, making progress towards PT goals; Likely not yet met max benefit from skilled PT, recommend continuing tx to address strength/balance/gait impairments to improve safety during her activities indoors and outdoors at home and to reduce fall risk.  OBJECTIVE IMPAIRMENTS:  Abnormal gait, decreased activity tolerance, decreased balance, decreased endurance, decreased strength, and impaired perceived functional ability.   ACTIVITY LIMITATIONS: standing, transfers, and locomotion level  PARTICIPATION LIMITATIONS: cleaning, interpersonal relationship, shopping, community activity, and yard work  PERSONAL FACTORS: Age, Time since onset of injury/illness/exacerbation, and 1-2 comorbidities: long COVID, orthopedic hx (see above) are also affecting patient's functional outcome.   REHAB POTENTIAL: Good  CLINICAL DECISION MAKING: Evolving/moderate complexity  EVALUATION COMPLEXITY: Moderate  PLAN:  PT FREQUENCY: 2x/week  PT DURATION: 6 weeks  PLANNED INTERVENTIONS: 97110-Therapeutic exercises, 97530- Therapeutic activity, 97112- Neuromuscular re-education, 97535- Self Care, and [id number]- Manual therapy  PLAN FOR NEXT SESSION: continue incorporating gaze stabilization exercises into tx, dynamic balance activities/dual task balance activities  Vernell Reges, PT, DPT, OCS  Vernell FORBES Reges, PT 09/04/2023, 11:09 AM

## 2023-09-07 ENCOUNTER — Ambulatory Visit

## 2023-09-07 DIAGNOSIS — R2681 Unsteadiness on feet: Secondary | ICD-10-CM | POA: Diagnosis not present

## 2023-09-07 DIAGNOSIS — R2689 Other abnormalities of gait and mobility: Secondary | ICD-10-CM

## 2023-09-07 DIAGNOSIS — R42 Dizziness and giddiness: Secondary | ICD-10-CM

## 2023-09-07 NOTE — Therapy (Signed)
 OUTPATIENT PHYSICAL THERAPY NEURO TREATMENT/PROGRESS NOTE/RE-CERT THROUGH 10/07/23   Patient Name: Amber Richardson MRN: [id number] DOB:01/09/1956, 68 y.o., female Today's Date: 09/07/2023  PCP: Dr. Glendia, MD  REFERRING PROVIDER: same  END OF SESSION:  PT End of Session - 09/07/23 1508     Visit Number 34    Number of Visits 46    Date for PT Re-Evaluation 09/07/23    Authorization Type 2x/week x 6 weeks (PN done on recert on 07/27/23)    Authorization Time Period Medicare, PN needed at visit #36    PT Start Time 1500    PT Stop Time 1545    PT Time Calculation (min) 45 min    Equipment Utilized During Treatment Gait belt    Activity Tolerance Patient tolerated treatment well    Behavior During Therapy [facility] for tasks assessed/performed              Past Medical History:  Diagnosis Date   Allergies    Basal cell carcinoma    Fatigue 08/30/2014   Generalized anxiety disorder 03/21/2011   GERD (gastroesophageal reflux disease)    History of COVID-19 02/22/2019   HLD (hyperlipidemia)    HSV infection 09/20/2014   Hypercholesterolemia 02/08/2015   Hyperglycemia 08/28/2019   Hyperthyroidism    Major depressive disorder    Osteoporosis 05/13/2015   Pyelonephritis 1985   Scalp, arm, and leg tingling 08/28/2019   Tendinosis 09/12/2013   Rotator cuff tendinosis. Mild capsulitis.   Tremor 08/28/2019   Subtle to mild, upper extremities, left worse than right   Past Surgical History:  Procedure Laterality Date   DILATION AND CURETTAGE OF UTERUS     Miscarriage     x1   TONSILLECTOMY  1963   Patient Active Problem List   Diagnosis Date Noted   Head injury 06/02/2023   Balance problem 01/02/2023   Poison ivy dermatitis 09/26/2022   Urinary frequency 09/26/2022   Abnormal liver function tests 08/22/2022   Flank pain 08/22/2022   Cold intolerance 08/11/2022   Concussion 08/11/2022   Diarrhea 03/26/2022   Headache 01/29/2022   Melanoma in situ (HCC) 06/08/2021    Contact dermatitis and eczema due to plant 05/31/2021   Healthcare maintenance 11/11/2020   Rib pain on right side 09/30/2020   Joint pain 09/19/2020   Chest congestion 06/17/2020   Joint stiffness 06/17/2020   Change in vision 06/17/2020   Unsteady gait 06/17/2020   Moderate episode of recurrent major depressive disorder (HCC) 06/17/2020   Muscle weakness 06/13/2020   Stress 11/14/2019   Breast cancer screening 11/14/2019   Major depressive disorder    Disorder of bursae of shoulder region 09/01/2019   Knee pain 09/01/2019   Low back pain 09/01/2019   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   Tremor 08/28/2019   Hyperglycemia 08/28/2019   History of COVID-19 02/20/2019   Left arm pain 01/09/2019   Neck pain 09/04/2018   Dizziness 12/19/2017   Dyspnea 09/13/2017   Left facial numbness 06/14/2015   Osteoporosis 05/13/2015   Hypercholesterolemia 02/08/2015   Left foot pain 11/04/2014   Atrophic vaginitis 09/20/2014   HSV infection 09/20/2014   Pelvic pain in female 09/20/2014   Fatigue 08/30/2014   Chest pain 08/30/2014   Abdominal pain 08/29/2014   Tendinosis 09/12/2013   Basal cell carcinoma 03/17/2012   Hyperthyroidism 03/17/2012   Fibroids 01/19/2012   BV (bacterial vaginosis) 01/05/2012   PMB (postmenopausal bleeding) 01/05/2012   Generalized anxiety disorder 03/21/2011   GERD (gastroesophageal reflux disease) 03/21/2011  ONSET DATE: 2021  REFERRING DIAG: R26.89 (ICD-10-CM) - Balance problem   THERAPY DIAG:  Unsteadiness on feet  Balance problem  Dizziness and giddiness  Rationale for Evaluation and Treatment: Rehabilitation  SUBJECTIVE: From initial evaluation note 03/02/23                                                                                                                                                                                            SUBJECTIVE STATEMENT: Pt states she is having difficulty with her balance.  She has been noticing it for  several months.  It is progessively worsening.  She feels unsteady on her feet at times while walking and has to lean into the wall or reach for support.  She attributes the balance changes to her long COVID recovery process.  She is not falling to floor.  But when she stands up and starts moving example: walking towards a door and then leans on the door or leans on the wall for balance; sometimes she is weaving when she ambulates.  She notices she veers to one side more than the other.  She has a consult with [facility] in March 2025 for a consult.  She had seen a neurologist earlier in the long COVID recovery process.  She has also seen her cardiologist and r/o cardiac reasons.    She has not been able to exercise a lot since COVID.  She still has sort term memory issues too.  She does walk 1.5 miles- flat neighborhood; she has not done any strength training.  If she overdoes it she will have a flare up for 2 days.    She is not lightheaded, no dizziness noted.  Pt accompanied by: self  PERTINENT HISTORY: Jan 2021- had COVID and long COVID sx; spent 6 months lying bed, had a tough recovery; has had exploratory R ankle arthroscopy, no other LE  or spine injuries or surgeries.  Retired Teacher, early years/pre   PAIN:  Are you having pain? No  PRECAUTIONS: Fall  RED FLAGS: None   WEIGHT BEARING RESTRICTIONS: No  FALLS: Has patient fallen in last 6 months? No, but catches herself with the wall/doorway  LIVING ENVIRONMENT: Lives with: lives with their spouse Lives in: House/apartment Stairs: yes, 3 story home- she is concerned about her balance going down the stairs- does not feel stable and leans to the R when going down the stairs; sometimes has R ankle pain while going down the stairs- has rail on the L but not on the R; has 3 steps to enter Has following equipment at home: None  PLOF: Independent  PATIENT GOALS: to  improve balance  OBJECTIVE:  Note: Objective measures were completed at  Evaluation unless otherwise noted. (Portions deferred secondary to time constraints as pt arrived late to appointment)  COGNITION: Overall cognitive status: Within functional limits for tasks assessed   SENSATION: Not tested  COORDINATION: deferred  POSTURE: rounded shoulders  LOWER EXTREMITY ROM:   functionally able to perform sit to stand independently  Active  Right Eval Left Eval  Hip flexion    Hip extension    Hip abduction    Hip adduction    Hip internal rotation    Hip external rotation    Knee flexion    Knee extension    Ankle dorsiflexion    Ankle plantarflexion    Ankle inversion    Ankle eversion     (Blank rows = not tested)  LOWER EXTREMITY MMT:    MMT Right Eval Left Eval  Hip flexion 4 4  Hip extension 4 4  Hip abduction    Hip adduction    Hip internal rotation    Hip external rotation    Knee flexion 4 4  Knee extension 4 4  Ankle dorsiflexion    Ankle plantarflexion    Ankle inversion    Ankle eversion    (Blank rows = not tested)   GAIT:  Will further assess at next visit  FUNCTIONAL TESTS:  5 times sit to stand: 15 seconds SL balance: R x 20 seconds, L x 12 seconds  PATIENT SURVEYS:  FGA to be administered at second visit                                                                                                                              TREATMENT DATE:09/04/23 Subjective: pt has not had any falls since last session.  She has not leaned heavily into any door jams.  Felt a little shortness of breath from her long covid this morning, used her inhaler and is feeling better.  She is overall feeling more steady.  Still having difficulty when she turns her head, and especially in rooms with solid walls as she loses sense of depth perception.  Pain: 0/10  Objective: 09/07/23 SLS balance R x 49 seconds, L x 44 seconds (was 20 and 12 at initial evaluation) LE MMT: hip flexion 5/5, knee extension 4+/5, knee flexion 4+/5 b/l    SpO2: 98% HR: 66 bpm at rest; 98 bpm during session  Neuro re-ed: 25 min FGA: administered and scored today at 24 ABC scale administered: 84%  Amb in hallway head turns R/L 2 laps and up/down 2 laps  Amb in hallway 2 laps tossing ball R/L follow with eyes with PT  Amb in hallway watching ball in her hands- eyes on moving horizontal target, 2 laps R/L and 2 laps up/down, 2 laps D2 R/2 laps D2 L, 2 laps moving ball close/far  Standing D2 pattern squat with follow ball with eyes low to high R and  L x 15 ea direction, standing on airex cushion today  Gaze stabilization (horizontal head turns) 20-30 sec intervals, static standing, feet together standing, standing on airex feet together, 3x  3 way lunges in hallway today (for balance challenge)- forward, lateral, reverse- 6 laps ea side  Tandem walk holding 5 lb med ball in hallway x 25 feet  Amb in hallway looking at ball with head turns R and L and PT moving solid colored disc in front of patient too- 4 laps (close supervision)  Walking forward lunges x 20   Not today: Tandem walk 4x20 ft with eyes focused on various targets, not looking at feet - on airex- not today Lateral hurdles outside parallel bars without UE support 4 laps R/L- not today Standing alternating toe taps on cones: 1 min intervals, not looking at feet Single leg balance: 3x ea LE with eyes looking forward 20-30 second intervals ea Sit to stand from physioball x10- not today 60 lb resisted trunk walk outs forward/back, right, left with turning head R/L- 8x ea scenario- not today Sit to stand with head turn to R or L target on wall x10, 2 sets ea direction- not today Sit to stand with head turn + initiating few steps to R or L combined x 8, 3 rounds ea direction; then added intervals of weaving figure 8 in/out of cones x 4; then added intervals of stepping over hurdles/various heights and obstacles x4; then added carrying box- not today   Therapeutic Activities:  15 min Squats: 2x12 to mat table 5# ball Step ups onto bosu: forward, alternating x 15 ea, step up and overs x15 ea direction holding 5# med ball Standing on bosu feet together 5# ball toss at trampoline x 20 Step over hurdles- lateral, not looking at feet (12 inch height) x 1 min, 2 rounds- holding ball and talking (dual tasking)- not today Forward step over hurdles- mixed 12 and 16 inch height without looking at feet- 4 laps Transfer standing to floor via quadruped 8x, used blue mat- worked on for functional activity as she has to assist her brother with putting on an ankle brace daily; practiced this for balance/safety; also practiced as a fall recovery technique strategy; discussed option to use furniture/firm chair to push up with 1 UE from tall kneeling too if she is feeling a bit fatigued- pt able to perform all versions today (PT supervision/tactile, verbal, demonstration cues and instructions with pt gait belt)- not today  Sit to stand holding 5 lb and walk in small CW or CWW circle around chair then sit down without hands- 8 reps ea direction  (Not today) Obstacle course: stepping over/around various heights, squat lift green physioball floor to waist, turn to R or L and put behind back, and 15 wall squats with physioball behind, set ball down, turn to R or L to return over obstacles.  Practiced multiple trials x 10 min, to simulate activities in home like bending to lift something and turning directions to carry it while also integrating LE strengthening with squat  Added carrying box (shoe box in hands) for dual tasking while completing sit to stand with 180 deg turn to walk around a corner to R/L while holding something at home like a functional activity she describes having difficulty with at home and stepping around cones/over obstacles, having to scan environment with eyes for safety (PT supervision with gait belt)   Therapeutic Exercise: not today Squats 2x10 holding 10 lb med  ball Star lunge:  R and L, focused on anterior/diagonal/lateral then posterior/diagonal/lateral 5 rounds ea, ea LE Heel raises x20, 2 sets Toe raises x20, 2 sets Bosu: step ups- RLLR, and LRRL x15 ea Towel curls L foot x 1 min- not today Self toe flexion stretch L x 30 seconds, 2 reps- not today Nustep x 6 min for LE strength level 5- not today  (Not today) Lateral band walk with blue TB at distal thighs, 4 laps x 20 ft Ascend/descend stairs: using single railing, x 4 laps- not today Stepping over obstacles: 4 laps in parallel bars (multi height hurdles)- not today Heel raises: 2x10 5# ankle weights- not today Toe raises: 2x10 5# ankle weights- not today Front step ups on 6 inch step: LRRL, RLLR x10 ea- not today Standing hamstring curl: 5# 2x12 Seated knee extension: 5# 2x12 Deadlifts: 2x9# dumbbells, 2x10- not today  HEP instruction- see below  PATIENT EDUCATION: Education details: PT POC/tx for balance and strength deficits, pt ed for benefits of continuing with her pool exercises while community pool is open for the remainder of the summer Person educated: Patient Education method: Explanation Education comprehension: verbalized understanding  HOME EXERCISE PROGRAM: Access Code: [email or web address] URL: [email or web address] Date: 03/09/2023 Prepared by: Vernell Reges  Exercises - Sit to Stand Without Arm Support  - 1 x daily - 7 x weekly - 4 sets - 5 reps - Tandem Walking with Counter Support  - 1 x daily - 7 x weekly - 4 sets - Single Leg Stance  - 1 x daily - 7 x weekly - 3-5 reps - 15 hold - Seated Gaze Stabilization with Head Nod  - 1 x daily - 7 x weekly - 30 reps - 30 sec hold - Seated Gaze Stabilization with Head Rotation  - 1 x daily - 7 x weekly - 3 reps - 30 sec hold   GOALS: Goals reviewed with patient? Yes  SHORT TERM GOALS: Target date: 09/07/23  Pt will demonstrate ability to perform HEP for LE strengthening and balance exercises with proper  technique/form in clinic Baseline: pt is not formally performing strength exercises currently at home; 04/27/23 in progress Goal status: In progress  LONG TERM GOALS: Target date: 10/07/23  Improve LE strength 1/2 MMT grade to facilitate improved ability to ambulate down her hallway at home and descend/ascend stairs in her home without losing balance Baseline: 4/5; 5/5 hip flexion (not knee extension or flex yet- still 4/5), ascending and descending stairs with improved balance, but hasn't been home to trial this in a few weeks; pt reports she is ascending/descending stairs at home without difficulty, but continues to lean heavily into wall sometimes in her hallway if she is moving quickly and going around a corner from one room to the next; 8/11: 4+/5 knee ext b/l and pt reports feeling more confident on stairs, still using 1 railing of carrying something down Goal status: In progress  2.  Improve FGA score to >23 indicating decreased fall risk during ambulation Baseline: to be administered at 2nd visit; will modifiy this goal accordingly if needed; 09/07/23: 24 Goal status: MET  3.  Improve 5x STS time to <15 seconds indicating decreased fall risk in population age >69 y/o Baseline: 15; 04/27/23: 9 seconds best trial, 11 seconds other trial; 8/11: 9 seconds  Goal status: MET    4. Improve ABC score >13 points (> 88%) indicating reduced risk for falls   Baseline 06/10/23: 74%; 6/30:81%; 8/11: 84%   Goal status: In progress  ASSESSMENT:  CLINICAL IMPRESSION: Overall pt's LE strength and balance are improving.  She is overall making progress towards PT goals; still remains most challenged and requires PT supervision for safety during dynamic gait activities or dual tasking activities that incorporate horizontal head turns.  Likely not yet met max benefit from skilled PT, recommend continuing tx to address strength/balance/gait impairments to improve safety during her activities indoors and  outdoors at home and to reduce fall risk.  Plan to continue x 1 more month and likely anticipate full DC to independent HEP and exercise program at that point.  OBJECTIVE IMPAIRMENTS: Abnormal gait, decreased activity tolerance, decreased balance, decreased endurance, decreased strength, and impaired perceived functional ability.   ACTIVITY LIMITATIONS: standing, transfers, and locomotion level  PARTICIPATION LIMITATIONS: cleaning, interpersonal relationship, shopping, community activity, and yard work  PERSONAL FACTORS: Age, Time since onset of injury/illness/exacerbation, and 1-2 comorbidities: long COVID, orthopedic hx (see above) are also affecting patient's functional outcome.   REHAB POTENTIAL: Good  CLINICAL DECISION MAKING: Evolving/moderate complexity  EVALUATION COMPLEXITY: Moderate  PLAN:  PT FREQUENCY: 1-2x/week  PT DURATION: 4 weeks  PLANNED INTERVENTIONS: 97110-Therapeutic exercises, 97530- Therapeutic activity, 97112- Neuromuscular re-education, 97535- Self Care, and [id number]- Manual therapy  PLAN FOR NEXT SESSION: continue incorporating gaze stabilization exercises into tx, dynamic balance activities/dual task balance activities with horizontal head turns; continue working on overall activity tolerance/endurance as she has h/o long covid too  Vernell Reges, PT, DPT, OCS  Dilcia Rybarczyk E Falesha Schommer, PT 09/07/2023, 3:10 PM

## 2023-09-10 NOTE — Therapy (Signed)
 OUTPATIENT PHYSICAL THERAPY NEURO TREATMENT  Patient Name: Amber Richardson MRN: [id number] DOB:[birth year], 68 y.o., female Today's Date: 09/11/2023  PCP: Dr. Glendia, MD  REFERRING PROVIDER: same  END OF SESSION:  PT End of Session - 09/11/23 1018     Visit Number 35    Number of Visits 44    Date for PT Re-Evaluation 10/07/23    Authorization Type 2x/week x 6 weeks (PN done on recert on 09/07/23 x 4 weeks)    Authorization Time Period Medicare, PN needed at visit #44    PT Start Time 1017    PT Stop Time 1058    PT Time Calculation (min) 41 min    Equipment Utilized During Treatment Gait belt    Activity Tolerance Patient tolerated treatment well    Behavior During Therapy WFL for tasks assessed/performed               Past Medical History:  Diagnosis Date   Allergies    Basal cell carcinoma    Fatigue 08/30/2014   Generalized anxiety disorder 03/21/2011   GERD (gastroesophageal reflux disease)    History of COVID-19 02/22/2019   HLD (hyperlipidemia)    HSV infection 09/20/2014   Hypercholesterolemia 02/08/2015   Hyperglycemia 08/28/2019   Hyperthyroidism    Major depressive disorder    Osteoporosis 05/13/2015   Pyelonephritis 1985   Scalp, arm, and leg tingling 08/28/2019   Tendinosis 09/12/2013   Rotator cuff tendinosis. Mild capsulitis.   Tremor 08/28/2019   Subtle to mild, upper extremities, left worse than right   Past Surgical History:  Procedure Laterality Date   DILATION AND CURETTAGE OF UTERUS     Miscarriage     x1   TONSILLECTOMY  1963   Patient Active Problem List   Diagnosis Date Noted   Head injury 06/02/2023   Balance problem 01/02/2023   Poison ivy dermatitis 09/26/2022   Urinary frequency 09/26/2022   Abnormal liver function tests 08/22/2022   Flank pain 08/22/2022   Cold intolerance 08/11/2022   Concussion 08/11/2022   Diarrhea 03/26/2022   Headache 01/29/2022   Melanoma in situ (HCC) 06/08/2021   Contact dermatitis and  eczema due to plant 05/31/2021   Healthcare maintenance 11/11/2020   Rib pain on right side 09/30/2020   Joint pain 09/19/2020   Chest congestion 06/17/2020   Joint stiffness 06/17/2020   Change in vision 06/17/2020   Unsteady gait 06/17/2020   Moderate episode of recurrent major depressive disorder (HCC) 06/17/2020   Muscle weakness 06/13/2020   Stress 11/14/2019   Breast cancer screening 11/14/2019   Major depressive disorder    Disorder of bursae of shoulder region 09/01/2019   Knee pain 09/01/2019   Low back pain 09/01/2019   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   Tremor 08/28/2019   Hyperglycemia 08/28/2019   History of COVID-19 02/20/2019   Left arm pain 01/09/2019   Neck pain 09/04/2018   Dizziness 12/19/2017   Dyspnea 09/13/2017   Left facial numbness 06/14/2015   Osteoporosis 05/13/2015   Hypercholesterolemia 02/08/2015   Left foot pain 11/04/2014   Atrophic vaginitis 09/20/2014   HSV infection 09/20/2014   Pelvic pain in female 09/20/2014   Fatigue 08/30/2014   Chest pain 08/30/2014   Abdominal pain 08/29/2014   Tendinosis 09/12/2013   Basal cell carcinoma 03/17/2012   Hyperthyroidism 03/17/2012   Fibroids 01/19/2012   BV (bacterial vaginosis) 01/05/2012   PMB (postmenopausal bleeding) 01/05/2012   Generalized anxiety disorder 03/21/2011   GERD (gastroesophageal reflux disease) 03/21/2011  ONSET DATE: 2021  REFERRING DIAG: R26.89 (ICD-10-CM) - Balance problem   THERAPY DIAG:  Unsteadiness on feet  Balance problem  Dizziness and giddiness  Rationale for Evaluation and Treatment: Rehabilitation  SUBJECTIVE: From initial evaluation note 03/02/23                                                                                                                                                                                            SUBJECTIVE STATEMENT: Pt states she is having difficulty with her balance.  She has been noticing it for several months.  It is  progessively worsening.  She feels unsteady on her feet at times while walking and has to lean into the wall or reach for support.  She attributes the balance changes to her long COVID recovery process.  She is not falling to floor.  But when she stands up and starts moving example: walking towards a door and then leans on the door or leans on the wall for balance; sometimes she is weaving when she ambulates.  She notices she veers to one side more than the other.  She has a consult with [facility] in March 2025 for a consult.  She had seen a neurologist earlier in the long COVID recovery process.  She has also seen her cardiologist and r/o cardiac reasons.    She has not been able to exercise a lot since COVID.  She still has sort term memory issues too.  She does walk 1.5 miles- flat neighborhood; she has not done any strength training.  If she overdoes it she will have a flare up for 2 days.    She is not lightheaded, no dizziness noted.  Pt accompanied by: self  PERTINENT HISTORY: Jan 2021- had COVID and long COVID sx; spent 6 months lying bed, had a tough recovery; has had exploratory R ankle arthroscopy, no other LE  or spine injuries or surgeries.  Retired Teacher, early years/pre   PAIN:  Are you having pain? No  PRECAUTIONS: Fall  RED FLAGS: None   WEIGHT BEARING RESTRICTIONS: No  FALLS: Has patient fallen in last 6 months? No, but catches herself with the wall/doorway  LIVING ENVIRONMENT: Lives with: lives with their spouse Lives in: House/apartment Stairs: yes, 3 story home- she is concerned about her balance going down the stairs- does not feel stable and leans to the R when going down the stairs; sometimes has R ankle pain while going down the stairs- has rail on the L but not on the R; has 3 steps to enter Has following equipment at home: None  PLOF: Independent  PATIENT GOALS: to  improve balance  OBJECTIVE:  Note: Objective measures were completed at Evaluation unless  otherwise noted. (Portions deferred secondary to time constraints as pt arrived late to appointment)  COGNITION: Overall cognitive status: Within functional limits for tasks assessed   SENSATION: Not tested  COORDINATION: deferred  POSTURE: rounded shoulders  LOWER EXTREMITY ROM:   functionally able to perform sit to stand independently  Active  Right Eval Left Eval  Hip flexion    Hip extension    Hip abduction    Hip adduction    Hip internal rotation    Hip external rotation    Knee flexion    Knee extension    Ankle dorsiflexion    Ankle plantarflexion    Ankle inversion    Ankle eversion     (Blank rows = not tested)  LOWER EXTREMITY MMT:    MMT Right Eval Left Eval  Hip flexion 4 4  Hip extension 4 4  Hip abduction    Hip adduction    Hip internal rotation    Hip external rotation    Knee flexion 4 4  Knee extension 4 4  Ankle dorsiflexion    Ankle plantarflexion    Ankle inversion    Ankle eversion    (Blank rows = not tested)   GAIT:  Will further assess at next visit  FUNCTIONAL TESTS:  5 times sit to stand: 15 seconds SL balance: R x 20 seconds, L x 12 seconds  PATIENT SURVEYS:  FGA to be administered at second visit                                                                                                                              TREATMENT DATE:09/04/23  Subjective: Patient continues to have difficulty with head turns with mobility.   Pain: 0/10   Neuro re-ed: 25 min  Amb in hallway 2 laps tossing ball up/down with eyes following ball  Amb in hallway with tossing ball L/R with eyes following ball with PT   Activity Description: 6 blaze pods spread across mirror above and below eye level with focus on quick head turns and change of direction  Activity Setting:  The Blaze Pod Random setting was chosen to enhance cognitive processing and agility, providing an unpredictable environment to simulate real-world scenarios, and  fostering quick reactions and adaptability.  Number of Pods:  6  Cycles/Sets:  2 Duration (Time or Hit Count):  1.5 minutes   Gaze stabilization (horizontal head turns) 30 sec intervals each, static standing with feet shoulder width apart, feet together standing, standing on airex feet apart, standing on airex with feet together  3 way lunges at star in floor x 10 each direction   Therapeutic Activities: 15 min Squats: 2x12 to mat table 5# ball Step ups onto bosu: forward, alternating x 10 leading with each LE   Forward step over hurdles- mixed 12 and 6 inch height without looking at feet- 4  laps   (Not today) Lateral band walk with blue TB at distal thighs, 4 laps x 20 ft Ascend/descend stairs: using single railing, x 4 laps- not today Stepping over obstacles: 4 laps in parallel bars (multi height hurdles)- not today Heel raises: 2x10 5# ankle weights- not today Toe raises: 2x10 5# ankle weights- not today Front step ups on 6 inch step: LRRL, RLLR x10 ea- not today Standing hamstring curl: 5# 2x12 Seated knee extension: 5# 2x12 Deadlifts: 2x9# dumbbells, 2x10- not today  HEP instruction- see below  PATIENT EDUCATION: Education details: PT POC/tx for balance and strength deficits, pt ed for benefits of continuing with her pool exercises while community pool is open for the remainder of the summer Person educated: Patient Education method: Explanation Education comprehension: verbalized understanding  HOME EXERCISE PROGRAM: Access Code: [email or web address] URL: [email or web address] Date: 03/09/2023 Prepared by: Vernell Reges  Exercises - Sit to Stand Without Arm Support  - 1 x daily - 7 x weekly - 4 sets - 5 reps - Tandem Walking with Counter Support  - 1 x daily - 7 x weekly - 4 sets - Single Leg Stance  - 1 x daily - 7 x weekly - 3-5 reps - 15 hold - Seated Gaze Stabilization with Head Nod  - 1 x daily - 7 x weekly - 30 reps - 30 sec hold - Seated Gaze  Stabilization with Head Rotation  - 1 x daily - 7 x weekly - 3 reps - 30 sec hold   GOALS: Goals reviewed with patient? Yes  SHORT TERM GOALS: Target date: 09/07/23  Pt will demonstrate ability to perform HEP for LE strengthening and balance exercises with proper technique/form in clinic Baseline: pt is not formally performing strength exercises currently at home; 04/27/23 in progress Goal status: In progress  LONG TERM GOALS: Target date: 10/07/23  Improve LE strength 1/2 MMT grade to facilitate improved ability to ambulate down her hallway at home and descend/ascend stairs in her home without losing balance Baseline: 4/5; 5/5 hip flexion (not knee extension or flex yet- still 4/5), ascending and descending stairs with improved balance, but hasn't been home to trial this in a few weeks; pt reports she is ascending/descending stairs at home without difficulty, but continues to lean heavily into wall sometimes in her hallway if she is moving quickly and going around a corner from one room to the next; 8/11: 4+/5 knee ext b/l and pt reports feeling more confident on stairs, still using 1 railing of carrying something down Goal status: In progress  2.  Improve FGA score to >23 indicating decreased fall risk during ambulation Baseline: to be administered at 2nd visit; will modifiy this goal accordingly if needed; 09/07/23: 24 Goal status: MET  3.  Improve 5x STS time to <15 seconds indicating decreased fall risk in population age >51 y/o Baseline: 15; 04/27/23: 9 seconds best trial, 11 seconds other trial; 8/11: 9 seconds  Goal status: MET    4. Improve ABC score >13 points (> 88%) indicating reduced risk for falls   Baseline 06/10/23: 74%; 6/30:81%; 8/11: 84%   Goal status: In progress   ASSESSMENT:  CLINICAL IMPRESSION:   Patient arrives to treatment session with no new complaints. Session focused on dynamic balance with quick head turns and change of direction along with gaze  stabilization. Good tolerance to activity with greatest difficulty with backwards lunges and step up on BOSU ball. Patient will continue to benefit from skilled therapy to address remaining deficits  in order to improve quality of life and balance.     OBJECTIVE IMPAIRMENTS: Abnormal gait, decreased activity tolerance, decreased balance, decreased endurance, decreased strength, and impaired perceived functional ability.   ACTIVITY LIMITATIONS: standing, transfers, and locomotion level  PARTICIPATION LIMITATIONS: cleaning, interpersonal relationship, shopping, community activity, and yard work  PERSONAL FACTORS: Age, Time since onset of injury/illness/exacerbation, and 1-2 comorbidities: long COVID, orthopedic hx (see above) are also affecting patient's functional outcome.   REHAB POTENTIAL: Good  CLINICAL DECISION MAKING: Evolving/moderate complexity  EVALUATION COMPLEXITY: Moderate  PLAN:  PT FREQUENCY: 1-2x/week  PT DURATION: 4 weeks  PLANNED INTERVENTIONS: 97110-Therapeutic exercises, 97530- Therapeutic activity, 97112- Neuromuscular re-education, 97535- Self Care, and [id number]- Manual therapy  PLAN FOR NEXT SESSION: continue incorporating gaze stabilization exercises into tx, dynamic balance activities/dual task balance activities with horizontal head turns; continue working on overall activity tolerance/endurance as she has h/o long covid too  Maryanne Finder, PT, DPT Physical Therapist - [place]  [facility] 09/11/2023, 10:18 AM

## 2023-09-11 ENCOUNTER — Ambulatory Visit

## 2023-09-11 DIAGNOSIS — R2689 Other abnormalities of gait and mobility: Secondary | ICD-10-CM

## 2023-09-11 DIAGNOSIS — R42 Dizziness and giddiness: Secondary | ICD-10-CM

## 2023-09-11 DIAGNOSIS — R2681 Unsteadiness on feet: Secondary | ICD-10-CM

## 2023-09-14 ENCOUNTER — Encounter

## 2023-09-16 ENCOUNTER — Ambulatory Visit

## 2023-09-16 DIAGNOSIS — R2681 Unsteadiness on feet: Secondary | ICD-10-CM | POA: Diagnosis not present

## 2023-09-16 DIAGNOSIS — R42 Dizziness and giddiness: Secondary | ICD-10-CM

## 2023-09-16 DIAGNOSIS — R2689 Other abnormalities of gait and mobility: Secondary | ICD-10-CM

## 2023-09-16 NOTE — Therapy (Signed)
 OUTPATIENT PHYSICAL THERAPY NEURO TREATMENT  Patient Name: Amber Richardson MRN: [id number] DOB:[birth year], 68 y.o., female Today's Date: 09/16/2023  PCP: Dr. Glendia, MD  REFERRING PROVIDER: same  END OF SESSION:  PT End of Session - 09/16/23 1459     Visit Number 36    Number of Visits 44    Date for PT Re-Evaluation 10/07/23    Authorization Type 2x/week x 6 weeks (PN done on recert on 09/07/23 x 4 weeks)    Authorization Time Period Medicare, PN needed at visit #44    PT Start Time 1500    PT Stop Time 1541    PT Time Calculation (min) 41 min    Equipment Utilized During Treatment Gait belt    Activity Tolerance Patient tolerated treatment well    Behavior During Therapy [facility] for tasks assessed/performed                Past Medical History:  Diagnosis Date   Allergies    Basal cell carcinoma    Fatigue 08/30/2014   Generalized anxiety disorder 03/21/2011   GERD (gastroesophageal reflux disease)    History of COVID-19 02/22/2019   HLD (hyperlipidemia)    HSV infection 09/20/2014   Hypercholesterolemia 02/08/2015   Hyperglycemia 08/28/2019   Hyperthyroidism    Major depressive disorder    Osteoporosis 05/13/2015   Pyelonephritis 1985   Scalp, arm, and leg tingling 08/28/2019   Tendinosis 09/12/2013   Rotator cuff tendinosis. Mild capsulitis.   Tremor 08/28/2019   Subtle to mild, upper extremities, left worse than right   Past Surgical History:  Procedure Laterality Date   DILATION AND CURETTAGE OF UTERUS     Miscarriage     x1   TONSILLECTOMY  1963   Patient Active Problem List   Diagnosis Date Noted   Head injury 06/02/2023   Balance problem 01/02/2023   Poison ivy dermatitis 09/26/2022   Urinary frequency 09/26/2022   Abnormal liver function tests 08/22/2022   Flank pain 08/22/2022   Cold intolerance 08/11/2022   Concussion 08/11/2022   Diarrhea 03/26/2022   Headache 01/29/2022   Melanoma in situ (HCC) 06/08/2021   Contact dermatitis and  eczema due to plant 05/31/2021   Healthcare maintenance 11/11/2020   Rib pain on right side 09/30/2020   Joint pain 09/19/2020   Chest congestion 06/17/2020   Joint stiffness 06/17/2020   Change in vision 06/17/2020   Unsteady gait 06/17/2020   Moderate episode of recurrent major depressive disorder (HCC) 06/17/2020   Muscle weakness 06/13/2020   Stress 11/14/2019   Breast cancer screening 11/14/2019   Major depressive disorder    Disorder of bursae of shoulder region 09/01/2019   Knee pain 09/01/2019   Low back pain 09/01/2019   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   Tremor 08/28/2019   Hyperglycemia 08/28/2019   History of COVID-19 02/20/2019   Left arm pain 01/09/2019   Neck pain 09/04/2018   Dizziness 12/19/2017   Dyspnea 09/13/2017   Left facial numbness 06/14/2015   Osteoporosis 05/13/2015   Hypercholesterolemia 02/08/2015   Left foot pain 11/04/2014   Atrophic vaginitis 09/20/2014   HSV infection 09/20/2014   Pelvic pain in female 09/20/2014   Fatigue 08/30/2014   Chest pain 08/30/2014   Abdominal pain 08/29/2014   Tendinosis 09/12/2013   Basal cell carcinoma 03/17/2012   Hyperthyroidism 03/17/2012   Fibroids 01/19/2012   BV (bacterial vaginosis) 01/05/2012   PMB (postmenopausal bleeding) 01/05/2012   Generalized anxiety disorder 03/21/2011   GERD (gastroesophageal reflux disease) 03/21/2011  ONSET DATE: 2021  REFERRING DIAG: R26.89 (ICD-10-CM) - Balance problem   THERAPY DIAG:  Unsteadiness on feet  Dizziness and giddiness  Balance problem  Rationale for Evaluation and Treatment: Rehabilitation  SUBJECTIVE: From initial evaluation note 03/02/23                                                                                                                                                                                            SUBJECTIVE STATEMENT: Pt states she is having difficulty with her balance.  She has been noticing it for several months.  It is  progessively worsening.  She feels unsteady on her feet at times while walking and has to lean into the wall or reach for support.  She attributes the balance changes to her long COVID recovery process.  She is not falling to floor.  But when she stands up and starts moving example: walking towards a door and then leans on the door or leans on the wall for balance; sometimes she is weaving when she ambulates.  She notices she veers to one side more than the other.  She has a consult with [facility] in March 2025 for a consult.  She had seen a neurologist earlier in the long COVID recovery process.  She has also seen her cardiologist and r/o cardiac reasons.    She has not been able to exercise a lot since COVID.  She still has sort term memory issues too.  She does walk 1.5 miles- [place] neighborhood; she has not done any strength training.  If she overdoes it she will have a flare up for 2 days.    She is not lightheaded, no dizziness noted.  Pt accompanied by: self  PERTINENT HISTORY: Jan 2021- had COVID and long COVID sx; spent 6 months lying bed, had a tough recovery; has had exploratory R ankle arthroscopy, no other LE  or spine injuries or surgeries.  Retired Teacher, early years/pre   PAIN:  Are you having pain? No  PRECAUTIONS: Fall  RED FLAGS: None   WEIGHT BEARING RESTRICTIONS: No  FALLS: Has patient fallen in last 6 months? No, but catches herself with the wall/doorway  LIVING ENVIRONMENT: Lives with: lives with their spouse Lives in: House/apartment Stairs: yes, 3 story home- she is concerned about her balance going down the stairs- does not feel stable and leans to the R when going down the stairs; sometimes has R ankle pain while going down the stairs- has rail on the L but not on the R; has 3 steps to enter Has following equipment at home: None  PLOF: Independent  PATIENT GOALS: to  improve balance  OBJECTIVE:  Note: Objective measures were completed at Evaluation unless  otherwise noted. (Portions deferred secondary to time constraints as pt arrived late to appointment)  COGNITION: Overall cognitive status: Within functional limits for tasks assessed   SENSATION: Not tested  COORDINATION: deferred  POSTURE: rounded shoulders  LOWER EXTREMITY ROM:   functionally able to perform sit to stand independently  Active  Right Eval Left Eval  Hip flexion    Hip extension    Hip abduction    Hip adduction    Hip internal rotation    Hip external rotation    Knee flexion    Knee extension    Ankle dorsiflexion    Ankle plantarflexion    Ankle inversion    Ankle eversion     (Blank rows = not tested)  LOWER EXTREMITY MMT:    MMT Right Eval Left Eval  Hip flexion 4 4  Hip extension 4 4  Hip abduction    Hip adduction    Hip internal rotation    Hip external rotation    Knee flexion 4 4  Knee extension 4 4  Ankle dorsiflexion    Ankle plantarflexion    Ankle inversion    Ankle eversion    (Blank rows = not tested)   GAIT:  Will further assess at next visit  FUNCTIONAL TESTS:  5 times sit to stand: 15 seconds SL balance: R x 20 seconds, L x 12 seconds  PATIENT SURVEYS:  FGA to be administered at second visit                                                                                                                              TREATMENT DATE: 09/16/23   Subjective: patient denies any new complaints this date.   Pain: 0/10  Neuro re-ed: 25 minutes   Tandem walking in // bars with 5# medicine ball carry x 6 laps   Amb in hallway 2 laps tossing ball up/down with eyes following ball  Amb in hallway with tossing ball L/R with eyes following ball with PT   Fwd lunges in // bars x 3 laps with no UE support   Gaze stabilization (horizontal head turns) 30 sec intervals each x 2, static standing with feet shoulder width apart, feet together standing, standing on airex feet apart, standing on airex with feet together - the  last set with standing on airex with feet together increased feeling of off balance and forward backward swaying   Therapeutic Activities: 15 minutes Squats: 2x12 to gray chair 5# ball Sidestepping in // bars with BTB around ankles x 3 laps D/B Step ups onto bosu: forward, alternating x 10 leading with each LE     (Not today) Ascend/descend stairs: using single railing, x 4 laps- not today Stepping over obstacles: 4 laps in parallel bars (multi height hurdles)- not today Heel raises: 2x10 5# ankle weights- not today Toe raises: 2x10 5#  ankle weights- not today Front step ups on 6 inch step: LRRL, RLLR x10 ea- not today Standing hamstring curl: 5# 2x12 Seated knee extension: 5# 2x12 Deadlifts: 2x9# dumbbells, 2x10- not today  HEP instruction- see below  PATIENT EDUCATION: Education details: PT POC/tx for balance and strength deficits, pt ed for benefits of continuing with her pool exercises while community pool is open for the remainder of the summer Person educated: Patient Education method: Explanation Education comprehension: verbalized understanding  HOME EXERCISE PROGRAM: Access Code: [email or web address] URL: [email or web address] Date: 03/09/2023 Prepared by: Vernell Reges  Exercises - Sit to Stand Without Arm Support  - 1 x daily - 7 x weekly - 4 sets - 5 reps - Tandem Walking with Counter Support  - 1 x daily - 7 x weekly - 4 sets - Single Leg Stance  - 1 x daily - 7 x weekly - 3-5 reps - 15 hold - Seated Gaze Stabilization with Head Nod  - 1 x daily - 7 x weekly - 30 reps - 30 sec hold - Seated Gaze Stabilization with Head Rotation  - 1 x daily - 7 x weekly - 3 reps - 30 sec hold   GOALS: Goals reviewed with patient? Yes  SHORT TERM GOALS: Target date: 09/07/23  Pt will demonstrate ability to perform HEP for LE strengthening and balance exercises with proper technique/form in clinic Baseline: pt is not formally performing strength exercises currently at  home; 04/27/23 in progress Goal status: In progress  LONG TERM GOALS: Target date: 10/07/23  Improve LE strength 1/2 MMT grade to facilitate improved ability to ambulate down her hallway at home and descend/ascend stairs in her home without losing balance Baseline: 4/5; 5/5 hip flexion (not knee extension or flex yet- still 4/5), ascending and descending stairs with improved balance, but hasn't been home to trial this in a few weeks; pt reports she is ascending/descending stairs at home without difficulty, but continues to lean heavily into wall sometimes in her hallway if she is moving quickly and going around a corner from one room to the next; 8/11: 4+/5 knee ext b/l and pt reports feeling more confident on stairs, still using 1 railing of carrying something down Goal status: In progress  2.  Improve FGA score to >23 indicating decreased fall risk during ambulation Baseline: to be administered at 2nd visit; will modifiy this goal accordingly if needed; 09/07/23: 24 Goal status: MET  3.  Improve 5x STS time to <15 seconds indicating decreased fall risk in population age >64 y/o Baseline: 15; 04/27/23: 9 seconds best trial, 11 seconds other trial; 8/11: 9 seconds  Goal status: MET    4. Improve ABC score >13 points (> 88%) indicating reduced risk for falls   Baseline 06/10/23: 74%; 6/30:81%; 8/11: 84%   Goal status: In progress   ASSESSMENT:  CLINICAL IMPRESSION:    Patient arrives to treatment session with no new complaints. Session focused on dynamic balance with quick head turns and change of direction along with gaze stabilization. Patient with increase in feeling of off balance and forward backward swaying with gaze stabilization exercises with narrow BOS on airex pad. Patient will continue to benefit from skilled therapy to address remaining deficits in order to improve quality of life and balance.     OBJECTIVE IMPAIRMENTS: Abnormal gait, decreased activity tolerance, decreased  balance, decreased endurance, decreased strength, and impaired perceived functional ability.   ACTIVITY LIMITATIONS: standing, transfers, and locomotion level  PARTICIPATION LIMITATIONS: cleaning, interpersonal relationship, shopping,  community activity, and yard work  PERSONAL FACTORS: Age, Time since onset of injury/illness/exacerbation, and 1-2 comorbidities: long COVID, orthopedic hx (see above) are also affecting patient's functional outcome.   REHAB POTENTIAL: Good  CLINICAL DECISION MAKING: Evolving/moderate complexity  EVALUATION COMPLEXITY: Moderate  PLAN:  PT FREQUENCY: 1-2x/week  PT DURATION: 4 weeks  PLANNED INTERVENTIONS: 97110-Therapeutic exercises, 97530- Therapeutic activity, 97112- Neuromuscular re-education, 97535- Self Care, and [id number]- Manual therapy  PLAN FOR NEXT SESSION: continue incorporating gaze stabilization exercises into tx, dynamic balance activities/dual task balance activities with horizontal head turns; continue working on overall activity tolerance/endurance as she has h/o long covid too  Maryanne Finder, PT, DPT Physical Therapist - [place]  [facility] 09/16/2023, 3:00 PM

## 2023-09-17 ENCOUNTER — Ambulatory Visit

## 2023-09-17 DIAGNOSIS — R42 Dizziness and giddiness: Secondary | ICD-10-CM

## 2023-09-17 DIAGNOSIS — R2681 Unsteadiness on feet: Secondary | ICD-10-CM | POA: Diagnosis not present

## 2023-09-17 DIAGNOSIS — R2689 Other abnormalities of gait and mobility: Secondary | ICD-10-CM

## 2023-09-17 NOTE — Therapy (Signed)
 OUTPATIENT PHYSICAL THERAPY NEURO TREATMENT  Patient Name: Amber Richardson MRN: [id number] DOB:1955/11/19, 68 y.o., female Today's Date: 09/17/2023  PCP: Dr. Glendia, MD  REFERRING PROVIDER: same  END OF SESSION:  PT End of Session - 09/17/23 1501     Visit Number 37    Number of Visits 44    Date for PT Re-Evaluation 10/07/23    Authorization Type 2x/week x 6 weeks (PN done on recert on 09/07/23 x 4 weeks)    Authorization Time Period Medicare, PN needed at visit #44    PT Start Time 1500    PT Stop Time 1545    PT Time Calculation (min) 45 min    Equipment Utilized During Treatment Gait belt    Activity Tolerance Patient tolerated treatment well    Behavior During Therapy [facility] for tasks assessed/performed                Past Medical History:  Diagnosis Date   Allergies    Basal cell carcinoma    Fatigue 08/30/2014   Generalized anxiety disorder 03/21/2011   GERD (gastroesophageal reflux disease)    History of COVID-19 02/22/2019   HLD (hyperlipidemia)    HSV infection 09/20/2014   Hypercholesterolemia 02/08/2015   Hyperglycemia 08/28/2019   Hyperthyroidism    Major depressive disorder    Osteoporosis 05/13/2015   Pyelonephritis 1985   Scalp, arm, and leg tingling 08/28/2019   Tendinosis 09/12/2013   Rotator cuff tendinosis. Mild capsulitis.   Tremor 08/28/2019   Subtle to mild, upper extremities, left worse than right   Past Surgical History:  Procedure Laterality Date   DILATION AND CURETTAGE OF UTERUS     Miscarriage     x1   TONSILLECTOMY  1963   Patient Active Problem List   Diagnosis Date Noted   Head injury 06/02/2023   Balance problem 01/02/2023   Poison ivy dermatitis 09/26/2022   Urinary frequency 09/26/2022   Abnormal liver function tests 08/22/2022   Flank pain 08/22/2022   Cold intolerance 08/11/2022   Concussion 08/11/2022   Diarrhea 03/26/2022   Headache 01/29/2022   Melanoma in situ (HCC) 06/08/2021   Contact dermatitis and  eczema due to plant 05/31/2021   Healthcare maintenance 11/11/2020   Rib pain on right side 09/30/2020   Joint pain 09/19/2020   Chest congestion 06/17/2020   Joint stiffness 06/17/2020   Change in vision 06/17/2020   Unsteady gait 06/17/2020   Moderate episode of recurrent major depressive disorder (HCC) 06/17/2020   Muscle weakness 06/13/2020   Stress 11/14/2019   Breast cancer screening 11/14/2019   Major depressive disorder    Disorder of bursae of shoulder region 09/01/2019   Knee pain 09/01/2019   Low back pain 09/01/2019   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   Tremor 08/28/2019   Hyperglycemia 08/28/2019   History of COVID-19 02/20/2019   Left arm pain 01/09/2019   Neck pain 09/04/2018   Dizziness 12/19/2017   Dyspnea 09/13/2017   Left facial numbness 06/14/2015   Osteoporosis 05/13/2015   Hypercholesterolemia 02/08/2015   Left foot pain 11/04/2014   Atrophic vaginitis 09/20/2014   HSV infection 09/20/2014   Pelvic pain in female 09/20/2014   Fatigue 08/30/2014   Chest pain 08/30/2014   Abdominal pain 08/29/2014   Tendinosis 09/12/2013   Basal cell carcinoma 03/17/2012   Hyperthyroidism 03/17/2012   Fibroids 01/19/2012   BV (bacterial vaginosis) 01/05/2012   PMB (postmenopausal bleeding) 01/05/2012   Generalized anxiety disorder 03/21/2011   GERD (gastroesophageal reflux disease) 03/21/2011  ONSET DATE: 2021  REFERRING DIAG: R26.89 (ICD-10-CM) - Balance problem   THERAPY DIAG:  Unsteadiness on feet  Dizziness and giddiness  Balance problem  Rationale for Evaluation and Treatment: Rehabilitation  SUBJECTIVE: From initial evaluation note 03/02/23                                                                                                                                                                                            SUBJECTIVE STATEMENT: Pt states she is having difficulty with her balance.  She has been noticing it for several months.  It is  progessively worsening.  She feels unsteady on her feet at times while walking and has to lean into the wall or reach for support.  She attributes the balance changes to her long COVID recovery process.  She is not falling to floor.  But when she stands up and starts moving example: walking towards a door and then leans on the door or leans on the wall for balance; sometimes she is weaving when she ambulates.  She notices she veers to one side more than the other.  She has a consult with [facility] in March 2025 for a consult.  She had seen a neurologist earlier in the long COVID recovery process.  She has also seen her cardiologist and r/o cardiac reasons.    She has not been able to exercise a lot since COVID.  She still has sort term memory issues too.  She does walk 1.5 miles- [place] neighborhood; she has not done any strength training.  If she overdoes it she will have a flare up for 2 days.    She is not lightheaded, no dizziness noted.  Pt accompanied by: self  PERTINENT HISTORY: Jan 2021- had COVID and long COVID sx; spent 6 months lying bed, had a tough recovery; has had exploratory R ankle arthroscopy, no other LE  or spine injuries or surgeries.  Retired Teacher, early years/pre   PAIN:  Are you having pain? No  PRECAUTIONS: Fall  RED FLAGS: None   WEIGHT BEARING RESTRICTIONS: No  FALLS: Has patient fallen in last 6 months? No, but catches herself with the wall/doorway  LIVING ENVIRONMENT: Lives with: lives with their spouse Lives in: House/apartment Stairs: yes, 3 story home- she is concerned about her balance going down the stairs- does not feel stable and leans to the R when going down the stairs; sometimes has R ankle pain while going down the stairs- has rail on the L but not on the R; has 3 steps to enter Has following equipment at home: None  PLOF: Independent  PATIENT GOALS: to  improve balance  OBJECTIVE:  Note: Objective measures were completed at Evaluation unless  otherwise noted. (Portions deferred secondary to time constraints as pt arrived late to appointment)  COGNITION: Overall cognitive status: Within functional limits for tasks assessed   SENSATION: Not tested  COORDINATION: deferred  POSTURE: rounded shoulders  LOWER EXTREMITY ROM:   functionally able to perform sit to stand independently  Active  Right Eval Left Eval  Hip flexion    Hip extension    Hip abduction    Hip adduction    Hip internal rotation    Hip external rotation    Knee flexion    Knee extension    Ankle dorsiflexion    Ankle plantarflexion    Ankle inversion    Ankle eversion     (Blank rows = not tested)  LOWER EXTREMITY MMT:    MMT Right Eval Left Eval  Hip flexion 4 4  Hip extension 4 4  Hip abduction    Hip adduction    Hip internal rotation    Hip external rotation    Knee flexion 4 4  Knee extension 4 4  Ankle dorsiflexion    Ankle plantarflexion    Ankle inversion    Ankle eversion    (Blank rows = not tested)   GAIT:  Will further assess at next visit  FUNCTIONAL TESTS:  5 times sit to stand: 15 seconds SL balance: R x 20 seconds, L x 12 seconds  PATIENT SURVEYS:  FGA to be administered at second visit                                                                                                                              TREATMENT DATE: 09/17/23   Subjective: patient reports no falls this week.  She had a good PT session, felt like she worked hard, feels a little fatigued from that.  Overall, making progress but still having some episodes of feeling a little swimmy headed with turning her head side to side quickly.  Pain: 0/10  Neuro re-ed: 25 minutes  Fwd lunges in // bars x 3 laps with no UE support  Single leg heel raises: ea side x 12 ea, with min UE support for balance Close weave in/out of cones in hallway holding 10 lb weight- 4 laps Walk through cones with PT verbal cue for color and which foot to tap,  while holding 10 lb weight- 4 laps Sit to stand with small 360 deg turn around chair then sit down controlled- practiced CW and CWW x 5 ea Tandem stance with head turns R/L x10, 3 rounds  Amb in hallway 2 laps tossing ball up/down with eyes following ball- not today Amb in hallway with tossing ball L/R with eyes following ball with PT- not today  Gaze stabilization (horizontal head turns) 30 sec intervals each x 2, static standing with feet shoulder width apart, feet together standing, standing on airex feet apart,  standing on airex with feet together - the last set with standing on airex with feet together increased feeling of off balance and forward backward swaying   Therapeutic Activities: 15 minutes Squats: 2x12 to gray chair 5# ball- no ball today, did squat holds instead Sidestepping in // bars with BTB around ankles x 3 laps D/B- not today Step ups onto bosu: forward, alternating x 10 leading with each LE with ball toss (5#) at trampoline x 5 reps at a time chest pass Front step up onto bosu with head turns R and L x 4 rounds with PT close supervision   (Not today) Ascend/descend stairs: using single railing, x 4 laps- not today Stepping over obstacles: 4 laps in parallel bars (multi height hurdles)- not today Heel raises: 2x10 5# ankle weights- not today Toe raises: 2x10 5# ankle weights- not today Front step ups on 6 inch step: LRRL, RLLR x10 ea- not today Standing hamstring curl: 5# 2x12 Seated knee extension: 5# 2x12 Deadlifts: 2x9# dumbbells, 2x10- not today  HEP instruction- see below  PATIENT EDUCATION: Education details: PT POC/tx for balance and strength deficits, pt ed for benefits of continuing with her pool exercises while community pool is open for the remainder of the summer Person educated: Patient Education method: Explanation Education comprehension: verbalized understanding  HOME EXERCISE PROGRAM: Access Code: [email or web address] URL:  [email or web address] Date: 03/09/2023 Prepared by: Vernell Reges  Exercises - Sit to Stand Without Arm Support  - 1 x daily - 7 x weekly - 4 sets - 5 reps - Tandem Walking with Counter Support  - 1 x daily - 7 x weekly - 4 sets - Single Leg Stance  - 1 x daily - 7 x weekly - 3-5 reps - 15 hold - Seated Gaze Stabilization with Head Nod  - 1 x daily - 7 x weekly - 30 reps - 30 sec hold - Seated Gaze Stabilization with Head Rotation  - 1 x daily - 7 x weekly - 3 reps - 30 sec hold   GOALS: Goals reviewed with patient? Yes  SHORT TERM GOALS: Target date: 09/07/23  Pt will demonstrate ability to perform HEP for LE strengthening and balance exercises with proper technique/form in clinic Baseline: pt is not formally performing strength exercises currently at home; 04/27/23 in progress Goal status: In progress  LONG TERM GOALS: Target date: 10/07/23  Improve LE strength 1/2 MMT grade to facilitate improved ability to ambulate down her hallway at home and descend/ascend stairs in her home without losing balance Baseline: 4/5; 5/5 hip flexion (not knee extension or flex yet- still 4/5), ascending and descending stairs with improved balance, but hasn't been home to trial this in a few weeks; pt reports she is ascending/descending stairs at home without difficulty, but continues to lean heavily into wall sometimes in her hallway if she is moving quickly and going around a corner from one room to the next; 8/11: 4+/5 knee ext b/l and pt reports feeling more confident on stairs, still using 1 railing of carrying something down Goal status: In progress  2.  Improve FGA score to >23 indicating decreased fall risk during ambulation Baseline: to be administered at 2nd visit; will modifiy this goal accordingly if needed; 09/07/23: 24 Goal status: MET  3.  Improve 5x STS time to <15 seconds indicating decreased fall risk in population age >59 y/o Baseline: 15; 04/27/23: 9 seconds best trial,  11 seconds other trial; 8/11: 9 seconds  Goal status: MET  4. Improve ABC score >13 points (> 88%) indicating reduced risk for falls   Baseline 06/10/23: 74%; 6/30:81%; 8/11: 84%   Goal status: In progress   ASSESSMENT:  CLINICAL IMPRESSION:    Patient had a few episodes of feet crossing over midline to steady herself while practicing dynamic gait with close figure 8 weave in and out of cones.  Was challenged with progression to unilateral heel raise.  Overall, appropriately challenged with dual tasking and dynamic balance activities incorporating horizontal head turns.  Requires close supervision with PT for safety, and no major losses of balance during session.  Patient will continue to benefit from skilled therapy to address remaining deficits in order to improve quality of life and balance.     OBJECTIVE IMPAIRMENTS: Abnormal gait, decreased activity tolerance, decreased balance, decreased endurance, decreased strength, and impaired perceived functional ability.   ACTIVITY LIMITATIONS: standing, transfers, and locomotion level  PARTICIPATION LIMITATIONS: cleaning, interpersonal relationship, shopping, community activity, and yard work  PERSONAL FACTORS: Age, Time since onset of injury/illness/exacerbation, and 1-2 comorbidities: long COVID, orthopedic hx (see above) are also affecting patient's functional outcome.   REHAB POTENTIAL: Good  CLINICAL DECISION MAKING: Evolving/moderate complexity  EVALUATION COMPLEXITY: Moderate  PLAN:  PT FREQUENCY: 1-2x/week  PT DURATION: 4 weeks  PLANNED INTERVENTIONS: 97110-Therapeutic exercises, 97530- Therapeutic activity, 97112- Neuromuscular re-education, 97535- Self Care, and [id number]- Manual therapy  PLAN FOR NEXT SESSION: continue incorporating gaze stabilization exercises into tx, dynamic balance activities/dual task balance activities with horizontal head turns; continue working on overall activity tolerance/endurance as she has h/o  long covid too  Vernell Reges, PT, DPT, OCS  09/17/2023, 4:20 PM

## 2023-09-21 ENCOUNTER — Ambulatory Visit

## 2023-09-23 ENCOUNTER — Ambulatory Visit

## 2023-09-23 DIAGNOSIS — R2681 Unsteadiness on feet: Secondary | ICD-10-CM | POA: Diagnosis not present

## 2023-09-23 DIAGNOSIS — R42 Dizziness and giddiness: Secondary | ICD-10-CM

## 2023-09-23 DIAGNOSIS — R2689 Other abnormalities of gait and mobility: Secondary | ICD-10-CM

## 2023-09-23 NOTE — Therapy (Signed)
 OUTPATIENT PHYSICAL THERAPY NEURO TREATMENT  Patient Name: Amber Richardson MRN: [id number] DOB:April 14, 1955, 68 y.o., female Today's Date: 09/23/2023  PCP: Dr. Glendia, MD  REFERRING PROVIDER: same  END OF SESSION:  PT End of Session - 09/23/23 1530     Visit Number 38    Number of Visits 44    Date for PT Re-Evaluation 10/07/23    Authorization Type 2x/week x 6 weeks (PN done on recert on 09/07/23 x 4 weeks)    Authorization Time Period Medicare, PN needed at visit #44    PT Start Time 1505    PT Stop Time 1550    PT Time Calculation (min) 45 min    Equipment Utilized During Treatment Gait belt    Activity Tolerance Patient tolerated treatment well    Behavior During Therapy [facility] for tasks assessed/performed                Past Medical History:  Diagnosis Date   Allergies    Basal cell carcinoma    Fatigue 08/30/2014   Generalized anxiety disorder 03/21/2011   GERD (gastroesophageal reflux disease)    History of COVID-19 02/22/2019   HLD (hyperlipidemia)    HSV infection 09/20/2014   Hypercholesterolemia 02/08/2015   Hyperglycemia 08/28/2019   Hyperthyroidism    Major depressive disorder    Osteoporosis 05/13/2015   Pyelonephritis 1985   Scalp, arm, and leg tingling 08/28/2019   Tendinosis 09/12/2013   Rotator cuff tendinosis. Mild capsulitis.   Tremor 08/28/2019   Subtle to mild, upper extremities, left worse than right   Past Surgical History:  Procedure Laterality Date   DILATION AND CURETTAGE OF UTERUS     Miscarriage     x1   TONSILLECTOMY  1963   Patient Active Problem List   Diagnosis Date Noted   Head injury 06/02/2023   Balance problem 01/02/2023   Poison ivy dermatitis 09/26/2022   Urinary frequency 09/26/2022   Abnormal liver function tests 08/22/2022   Flank pain 08/22/2022   Cold intolerance 08/11/2022   Concussion 08/11/2022   Diarrhea 03/26/2022   Headache 01/29/2022   Melanoma in situ (HCC) 06/08/2021   Contact dermatitis and  eczema due to plant 05/31/2021   Healthcare maintenance 11/11/2020   Rib pain on right side 09/30/2020   Joint pain 09/19/2020   Chest congestion 06/17/2020   Joint stiffness 06/17/2020   Change in vision 06/17/2020   Unsteady gait 06/17/2020   Moderate episode of recurrent major depressive disorder (HCC) 06/17/2020   Muscle weakness 06/13/2020   Stress 11/14/2019   Breast cancer screening 11/14/2019   Major depressive disorder    Disorder of bursae of shoulder region 09/01/2019   Knee pain 09/01/2019   Low back pain 09/01/2019   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   Tremor 08/28/2019   Hyperglycemia 08/28/2019   History of COVID-19 02/20/2019   Left arm pain 01/09/2019   Neck pain 09/04/2018   Dizziness 12/19/2017   Dyspnea 09/13/2017   Left facial numbness 06/14/2015   Osteoporosis 05/13/2015   Hypercholesterolemia 02/08/2015   Left foot pain 11/04/2014   Atrophic vaginitis 09/20/2014   HSV infection 09/20/2014   Pelvic pain in female 09/20/2014   Fatigue 08/30/2014   Chest pain 08/30/2014   Abdominal pain 08/29/2014   Tendinosis 09/12/2013   Basal cell carcinoma 03/17/2012   Hyperthyroidism 03/17/2012   Fibroids 01/19/2012   BV (bacterial vaginosis) 01/05/2012   PMB (postmenopausal bleeding) 01/05/2012   Generalized anxiety disorder 03/21/2011   GERD (gastroesophageal reflux disease) 03/21/2011  ONSET DATE: 2021  REFERRING DIAG: R26.89 (ICD-10-CM) - Balance problem   THERAPY DIAG:  Unsteadiness on feet  Dizziness and giddiness  Balance problem  Rationale for Evaluation and Treatment: Rehabilitation  SUBJECTIVE: From initial evaluation note 03/02/23                                                                                                                                                                                            SUBJECTIVE STATEMENT: Pt states she is having difficulty with her balance.  She has been noticing it for several months.  It is  progessively worsening.  She feels unsteady on her feet at times while walking and has to lean into the wall or reach for support.  She attributes the balance changes to her long COVID recovery process.  She is not falling to floor.  But when she stands up and starts moving example: walking towards a door and then leans on the door or leans on the wall for balance; sometimes she is weaving when she ambulates.  She notices she veers to one side more than the other.  She has a consult with [facility] in March 2025 for a consult.  She had seen a neurologist earlier in the long COVID recovery process.  She has also seen her cardiologist and r/o cardiac reasons.    She has not been able to exercise a lot since COVID.  She still has sort term memory issues too.  She does walk 1.5 miles- flat neighborhood; she has not done any strength training.  If she overdoes it she will have a flare up for 2 days.    She is not lightheaded, no dizziness noted.  Pt accompanied by: self  PERTINENT HISTORY: Jan 2021- had COVID and long COVID sx; spent 6 months lying bed, had a tough recovery; has had exploratory R ankle arthroscopy, no other LE  or spine injuries or surgeries.  Retired Teacher, early years/pre   PAIN:  Are you having pain? No  PRECAUTIONS: Fall  RED FLAGS: None   WEIGHT BEARING RESTRICTIONS: No  FALLS: Has patient fallen in last 6 months? No, but catches herself with the wall/doorway  LIVING ENVIRONMENT: Lives with: lives with their spouse Lives in: House/apartment Stairs: yes, 3 story home- she is concerned about her balance going down the stairs- does not feel stable and leans to the R when going down the stairs; sometimes has R ankle pain while going down the stairs- has rail on the L but not on the R; has 3 steps to enter Has following equipment at home: None  PLOF: Independent  PATIENT GOALS: to  improve balance  OBJECTIVE:  Note: Objective measures were completed at Evaluation unless  otherwise noted. (Portions deferred secondary to time constraints as pt arrived late to appointment)  COGNITION: Overall cognitive status: Within functional limits for tasks assessed   SENSATION: Not tested  COORDINATION: deferred  POSTURE: rounded shoulders  LOWER EXTREMITY ROM:   functionally able to perform sit to stand independently  Active  Right Eval Left Eval  Hip flexion    Hip extension    Hip abduction    Hip adduction    Hip internal rotation    Hip external rotation    Knee flexion    Knee extension    Ankle dorsiflexion    Ankle plantarflexion    Ankle inversion    Ankle eversion     (Blank rows = not tested)  LOWER EXTREMITY MMT:    MMT Right Eval Left Eval  Hip flexion 4 4  Hip extension 4 4  Hip abduction    Hip adduction    Hip internal rotation    Hip external rotation    Knee flexion 4 4  Knee extension 4 4  Ankle dorsiflexion    Ankle plantarflexion    Ankle inversion    Ankle eversion    (Blank rows = not tested)   GAIT:  Will further assess at next visit  FUNCTIONAL TESTS:  5 times sit to stand: 15 seconds SL balance: R x 20 seconds, L x 12 seconds  PATIENT SURVEYS:  FGA to be administered at second visit                                                                                                                              TREATMENT DATE: 09/23/23   Subjective: patient reports no falls this week.  Had a few episodes of feeling swimmy headed.    Pain: 0/10  Neuro re-ed:  Fwd lunges in // bars x 3 laps with no UE support  Single leg heel raises: ea side x 12 ea, with min UE support for balance Close weave in/out of cones in hallway holding 10 lb weight- 4 laps Walk through cones with PT verbal cue for color and which foot to tap, while holding 10 lb weight- 4 laps Sit to stand with small 360 deg turn around chair then sit down controlled- practiced CW and CWW x 5 ea Tandem stance with head turns R/L x10, 3  rounds Amb with trunk rotation holding ball- follow with eyes 4x25 ft (simulating reciprocal trunk rot/LE movement during gait) Amb in hallway 2 laps tossing ball up/down with eyes following ball- not today Amb in hallway with tossing ball L/R with eyes following ball with PT- not today  Gaze stabilization (horizontal head turns) 30 sec intervals each x 2, static standing with feet shoulder width apart, feet together standing, standing on airex feet apart, standing on airex with feet together - the last set with standing on airex with  feet together increased feeling of off balance and forward backward swaying   Therapeutic Activities:  Squats: 2x12 to gray chair 5# ball- no ball today, did squat holds instead Sidestepping in // bars with BTB around ankles x 3 laps D/B- not today Step ups onto bosu: forward, alternating x 10 leading with each LE with ball toss (5#) at trampoline x 5 reps at a time chest pass Front step up onto bosu with head turns R and L x 4 rounds with PT close supervision  Therapeutic Exercise:  Squats x 20 Heel raises x 20 Ankle DF x 20 3 rounds  (Not today) Ascend/descend stairs: using single railing, x 4 laps- not today Stepping over obstacles: 4 laps in parallel bars (multi height hurdles)- not today Heel raises: 2x10 5# ankle weights- not today Toe raises: 2x10 5# ankle weights- not today Front step ups on 6 inch step: LRRL, RLLR x10 ea- not today Standing hamstring curl: 5# 2x12 Seated knee extension: 5# 2x12 Deadlifts: 2x9# dumbbells, 2x10- not today  HEP instruction- see below  PATIENT EDUCATION: Education details: PT POC/tx for balance and strength deficits, pt ed for benefits of continuing with her pool exercises while community pool is open for the remainder of the summer Person educated: Patient Education method: Explanation Education comprehension: verbalized understanding  HOME EXERCISE PROGRAM: Access Code: [email or web address] URL:  [email or web address] Date: 03/09/2023 Prepared by: Vernell Reges  Exercises - Sit to Stand Without Arm Support  - 1 x daily - 7 x weekly - 4 sets - 5 reps - Tandem Walking with Counter Support  - 1 x daily - 7 x weekly - 4 sets - Single Leg Stance  - 1 x daily - 7 x weekly - 3-5 reps - 15 hold - Seated Gaze Stabilization with Head Nod  - 1 x daily - 7 x weekly - 30 reps - 30 sec hold - Seated Gaze Stabilization with Head Rotation  - 1 x daily - 7 x weekly - 3 reps - 30 sec hold   GOALS: Goals reviewed with patient? Yes  SHORT TERM GOALS: Target date: 09/07/23  Pt will demonstrate ability to perform HEP for LE strengthening and balance exercises with proper technique/form in clinic Baseline: pt is not formally performing strength exercises currently at home; 04/27/23 in progress Goal status: In progress  LONG TERM GOALS: Target date: 10/07/23  Improve LE strength 1/2 MMT grade to facilitate improved ability to ambulate down her hallway at home and descend/ascend stairs in her home without losing balance Baseline: 4/5; 5/5 hip flexion (not knee extension or flex yet- still 4/5), ascending and descending stairs with improved balance, but hasn't been home to trial this in a few weeks; pt reports she is ascending/descending stairs at home without difficulty, but continues to lean heavily into wall sometimes in her hallway if she is moving quickly and going around a corner from one room to the next; 8/11: 4+/5 knee ext b/l and pt reports feeling more confident on stairs, still using 1 railing of carrying something down Goal status: In progress  2.  Improve FGA score to >23 indicating decreased fall risk during ambulation Baseline: to be administered at 2nd visit; will modifiy this goal accordingly if needed; 09/07/23: 24 Goal status: MET  3.  Improve 5x STS time to <15 seconds indicating decreased fall risk in population age >78 y/o Baseline: 15; 04/27/23: 9 seconds best trial,  11 seconds other trial; 8/11: 9 seconds  Goal status: MET  4. Improve ABC score >13 points (> 88%) indicating reduced risk for falls   Baseline 06/10/23: 74%; 6/30:81%; 8/11: 84%   Goal status: In progress   ASSESSMENT:  CLINICAL IMPRESSION:    Patient had a few episodes of LOB and utilized stepping strategy to regain balance today during dynamic gait activities.  Overall, appropriately challenged with dual tasking and dynamic balance activities incorporating horizontal head turns.  Requires close supervision with PT for safety, and no major losses of balance during session.  Patient will continue to benefit from skilled therapy to address remaining deficits in order to improve quality of life and balance.     OBJECTIVE IMPAIRMENTS: Abnormal gait, decreased activity tolerance, decreased balance, decreased endurance, decreased strength, and impaired perceived functional ability.   ACTIVITY LIMITATIONS: standing, transfers, and locomotion level  PARTICIPATION LIMITATIONS: cleaning, interpersonal relationship, shopping, community activity, and yard work  PERSONAL FACTORS: Age, Time since onset of injury/illness/exacerbation, and 1-2 comorbidities: long COVID, orthopedic hx (see above) are also affecting patient's functional outcome.   REHAB POTENTIAL: Good  CLINICAL DECISION MAKING: Evolving/moderate complexity  EVALUATION COMPLEXITY: Moderate  PLAN:  PT FREQUENCY: 1-2x/week  PT DURATION: 4 weeks  PLANNED INTERVENTIONS: 97110-Therapeutic exercises, 97530- Therapeutic activity, 97112- Neuromuscular re-education, 97535- Self Care, and [id number]- Manual therapy  PLAN FOR NEXT SESSION: continue incorporating gaze stabilization exercises into tx, dynamic balance activities/dual task balance activities with horizontal head turns; continue working on overall activity tolerance/endurance as she has h/o long covid too  Vernell Reges, PT, DPT, OCS  09/23/2023, 3:31 PM

## 2023-09-30 ENCOUNTER — Ambulatory Visit: Attending: Internal Medicine

## 2023-09-30 DIAGNOSIS — R42 Dizziness and giddiness: Secondary | ICD-10-CM | POA: Diagnosis present

## 2023-09-30 DIAGNOSIS — R2681 Unsteadiness on feet: Secondary | ICD-10-CM | POA: Diagnosis present

## 2023-09-30 DIAGNOSIS — R2689 Other abnormalities of gait and mobility: Secondary | ICD-10-CM | POA: Insufficient documentation

## 2023-09-30 NOTE — Therapy (Signed)
 OUTPATIENT PHYSICAL THERAPY NEURO TREATMENT and D/C Note  Patient Name: Amber Richardson MRN: [id number] DOB:Jan 01, 1956, 68 y.o., female Today's Date: 09/30/2023  PCP: Dr. Glendia, MD  REFERRING PROVIDER: same  END OF SESSION:  PT End of Session - 09/30/23 0948     Visit Number [id number]    Number of Visits 44    Date for PT Re-Evaluation 10/07/23    Authorization Type 2x/week x 6 weeks (PN done on recert on 09/07/23 x 4 weeks)    Authorization Time Period Medicare, PN needed at visit #44    PT Start Time 0947    PT Stop Time 1030    PT Time Calculation (min) 43 min    Equipment Utilized During Treatment Gait belt    Activity Tolerance Patient tolerated treatment well    Behavior During Therapy [facility] for tasks assessed/performed                Past Medical History:  Diagnosis Date   Allergies    Basal cell carcinoma    Fatigue 08/30/2014   Generalized anxiety disorder 03/21/2011   GERD (gastroesophageal reflux disease)    History of COVID-19 02/22/2019   HLD (hyperlipidemia)    HSV infection 09/20/2014   Hypercholesterolemia 02/08/2015   Hyperglycemia 08/28/2019   Hyperthyroidism    Major depressive disorder    Osteoporosis 05/13/2015   Pyelonephritis 1985   Scalp, arm, and leg tingling 08/28/2019   Tendinosis 09/12/2013   Rotator cuff tendinosis. Mild capsulitis.   Tremor 08/28/2019   Subtle to mild, upper extremities, left worse than right   Past Surgical History:  Procedure Laterality Date   DILATION AND CURETTAGE OF UTERUS     Miscarriage     x1   TONSILLECTOMY  1963   Patient Active Problem List   Diagnosis Date Noted   Head injury 06/02/2023   Balance problem 01/02/2023   Poison ivy dermatitis 09/26/2022   Urinary frequency 09/26/2022   Abnormal liver function tests 08/22/2022   Flank pain 08/22/2022   Cold intolerance 08/11/2022   Concussion 08/11/2022   Diarrhea 03/26/2022   Headache 01/29/2022   Melanoma in situ (HCC) 06/08/2021   Contact  dermatitis and eczema due to plant 05/31/2021   Healthcare maintenance 11/11/2020   Rib pain on right side 09/30/2020   Joint pain 09/19/2020   Chest congestion 06/17/2020   Joint stiffness 06/17/2020   Change in vision 06/17/2020   Unsteady gait 06/17/2020   Moderate episode of recurrent major depressive disorder (HCC) 06/17/2020   Muscle weakness 06/13/2020   Stress 11/14/2019   Breast cancer screening 11/14/2019   Major depressive disorder    Disorder of bursae of shoulder region 09/01/2019   Knee pain 09/01/2019   Low back pain 09/01/2019   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   Tremor 08/28/2019   Hyperglycemia 08/28/2019   History of COVID-19 02/20/2019   Left arm pain 01/09/2019   Neck pain 09/04/2018   Dizziness 12/19/2017   Dyspnea 09/13/2017   Left facial numbness 06/14/2015   Osteoporosis 05/13/2015   Hypercholesterolemia 02/08/2015   Left foot pain 11/04/2014   Atrophic vaginitis 09/20/2014   HSV infection 09/20/2014   Pelvic pain in female 09/20/2014   Fatigue 08/30/2014   Chest pain 08/30/2014   Abdominal pain 08/29/2014   Tendinosis 09/12/2013   Basal cell carcinoma 03/17/2012   Hyperthyroidism 03/17/2012   Fibroids 01/19/2012   BV (bacterial vaginosis) 01/05/2012   PMB (postmenopausal bleeding) 01/05/2012   Generalized anxiety disorder 03/21/2011   GERD (gastroesophageal  reflux disease) 03/21/2011    ONSET DATE: 2021  REFERRING DIAG: R26.89 (ICD-10-CM) - Balance problem   THERAPY DIAG:  Unsteadiness on feet  Dizziness and giddiness  Balance problem  Rationale for Evaluation and Treatment: Rehabilitation  SUBJECTIVE: From initial evaluation note 03/02/23                                                                                                                                                                                            SUBJECTIVE STATEMENT: Pt states she is having difficulty with her balance.  She has been noticing it for several  months.  It is progessively worsening.  She feels unsteady on her feet at times while walking and has to lean into the wall or reach for support.  She attributes the balance changes to her long COVID recovery process.  She is not falling to floor.  But when she stands up and starts moving example: walking towards a door and then leans on the door or leans on the wall for balance; sometimes she is weaving when she ambulates.  She notices she veers to one side more than the other.  She has a consult with [facility] in March 2025 for a consult.  She had seen a neurologist earlier in the long COVID recovery process.  She has also seen her cardiologist and r/o cardiac reasons.    She has not been able to exercise a lot since COVID.  She still has sort term memory issues too.  She does walk 1.5 miles- flat neighborhood; she has not done any strength training.  If she overdoes it she will have a flare up for 2 days.    She is not lightheaded, no dizziness noted.  Pt accompanied by: self  PERTINENT HISTORY: Jan 2021- had COVID and long COVID sx; spent 6 months lying bed, had a tough recovery; has had exploratory R ankle arthroscopy, no other LE  or spine injuries or surgeries.  Retired Teacher, early years/pre   PAIN:  Are you having pain? No  PRECAUTIONS: Fall  RED FLAGS: None   WEIGHT BEARING RESTRICTIONS: No  FALLS: Has patient fallen in last 6 months? No, but catches herself with the wall/doorway  LIVING ENVIRONMENT: Lives with: lives with their spouse Lives in: House/apartment Stairs: yes, 3 story home- she is concerned about her balance going down the stairs- does not feel stable and leans to the R when going down the stairs; sometimes has R ankle pain while going down the stairs- has rail on the L but not on the R; has 3 steps to enter Has following equipment at home: None  PLOF: Independent  PATIENT GOALS: to improve balance  OBJECTIVE:  Note: Objective measures were completed at  Evaluation unless otherwise noted. (Portions deferred secondary to time constraints as pt arrived late to appointment)  COGNITION: Overall cognitive status: Within functional limits for tasks assessed   SENSATION: Not tested  COORDINATION: deferred  POSTURE: rounded shoulders  LOWER EXTREMITY ROM:   functionally able to perform sit to stand independently  Active  Right Eval Left Eval  Hip flexion    Hip extension    Hip abduction    Hip adduction    Hip internal rotation    Hip external rotation    Knee flexion    Knee extension    Ankle dorsiflexion    Ankle plantarflexion    Ankle inversion    Ankle eversion     (Blank rows = not tested)  LOWER EXTREMITY MMT:    MMT Right Eval Left Eval  Hip flexion 4 4  Hip extension 4 4  Hip abduction    Hip adduction    Hip internal rotation    Hip external rotation    Knee flexion 4 4  Knee extension 4 4  Ankle dorsiflexion    Ankle plantarflexion    Ankle inversion    Ankle eversion    (Blank rows = not tested)   GAIT:  Will further assess at next visit  FUNCTIONAL TESTS:  5 times sit to stand: 15 seconds SL balance: R x 20 seconds, L x 12 seconds  PATIENT SURVEYS:  FGA to be administered at second visit                                                                                                                              TREATMENT DATE: 09/30/23   Subjective: patient reports no falls this week.  Had a few episodes of feeling swimmy headed.    Pain: 0/10  Neuro re-ed:  Fwd lunges in // bars x 3 laps with no UE support  Single leg heel raises: ea side x 12 ea, with min UE support for balance Walk through cones with PT verbal cue for color and which foot to tap, while holding 10 lb weight- 4 laps Sit to stand with small 360 deg turn around chair then sit down controlled- practiced CW and CWW x 5 ea Tandem walk with eyes forward and then head turns R/L x10, 3 rounds Amb with trunk rotation holding  ball- follow with eyes 4x25 ft (simulating reciprocal trunk rot/LE movement during gait) Amb in hallway 2 laps tossing ball up/down with eyes following ball Amb in hallway with tossing ball L/R with eyes following ball with PT  Gaze stabilization (horizontal head turns) 30 sec intervals each x 2, static standing with feet shoulder width apart, feet together standing, standing on airex feet apart, standing on airex with feet together - the last set with standing on airex with feet together increased feeling of off balance  and forward backward swaying   Therapeutic Activities:  Squats: 2x12 to gray chair 5# ball- no ball today, did squat holds instead Sidestepping in // bars with BTB around ankles x 3 laps D/B- not today Step ups onto bosu: forward, alternating x 10 leading with each LE with ball toss (5#) at trampoline x 5 reps at a time chest pass Front step up onto bosu with head turns R and L x 4 rounds with PT close supervision  (Not today) Ascend/descend stairs: using single railing, x 4 laps- not today Stepping over obstacles: 4 laps in parallel bars (multi height hurdles)- not today Heel raises: 2x10 5# ankle weights- not today Toe raises: 2x10 5# ankle weights- not today Front step ups on 6 inch step: LRRL, RLLR x10 ea- not today Standing hamstring curl: 5# 2x12 Seated knee extension: 5# 2x12 Deadlifts: 2x9# dumbbells, 2x10- not today  HEP instruction- see below; discussed benefits of performing VOR, HEP, strengthening exercises with her bowflex and her rowing machine at home  PATIENT EDUCATION: Education details: PT POC/tx for balance and strength deficits, pt ed for benefits of continuing with her pool exercises while community pool is open for the remainder of the summer; HEP Person educated: Patient Education method: Explanation Education comprehension: verbalized understanding  HOME EXERCISE PROGRAM: Access Code: [email or web address] URL: [email or web address] Date:  03/09/2023 Prepared by: Vernell Reges  Exercises - Sit to Stand Without Arm Support  - 1 x daily - 7 x weekly - 4 sets - 5 reps - Tandem Walking with Counter Support  - 1 x daily - 7 x weekly - 4 sets - Single Leg Stance  - 1 x daily - 7 x weekly - 3-5 reps - 15 hold - Seated Gaze Stabilization with Head Nod  - 1 x daily - 7 x weekly - 30 reps - 30 sec hold - Seated Gaze Stabilization with Head Rotation  - 1 x daily - 7 x weekly - 3 reps - 30 sec hold   GOALS: Goals reviewed with patient? Yes  SHORT TERM GOALS: Target date: 09/07/23  Pt will demonstrate ability to perform HEP for LE strengthening and balance exercises with proper technique/form in clinic Baseline: pt is not formally performing strength exercises currently at home; 04/27/23 in progress; 09/30/23: has HEP and able to perform with proper technique Goal status: MET  LONG TERM GOALS: Target date: 10/07/23  Improve LE strength 1/2 MMT grade to facilitate improved ability to ambulate down her hallway at home and descend/ascend stairs in her home without losing balance Baseline: 4/5; 5/5 hip flexion (not knee extension or flex yet- still 4/5), ascending and descending stairs with improved balance, but hasn't been home to trial this in a few weeks; pt reports she is ascending/descending stairs at home without difficulty, but continues to lean heavily into wall sometimes in her hallway if she is moving quickly and going around a corner from one room to the next; 8/11: 4+/5 knee ext b/l and pt reports feeling more confident on stairs, still using 1 railing of carrying something down; 9/3: ascending and descending stairs without difficulty or loss of balance Goal status: MET  2.  Improve FGA score to >23 indicating decreased fall risk during ambulation Baseline: to be administered at 2nd visit; will modifiy this goal accordingly if needed; 09/07/23: 24 Goal status: MET  3.  Improve 5x STS time to <15 seconds indicating decreased fall  risk in population age >69 y/o Baseline: 15; 04/27/23: 9 seconds best trial, 11  seconds other trial; 8/11: 9 seconds  Goal status: MET    4. Improve ABC score >13 points (> 88%) indicating reduced risk for falls   Baseline 06/10/23: 74%; 6/30:81%; 8/11: 84%   Goal status: In progress   ASSESSMENT:  CLINICAL IMPRESSION:    Pt tolerated performing standing balance/strengthening exercises today and was appropriately challenged during session.  No loss of balance occurred today that pt was not able to recover from on her own with appropriate stepping, ankle/hip/trunk strategy implemented.  She has made significant progress towards PT goals.  Patient would like to continue working on HEP independently today to allow more time in her weekly routine to take care of other aspects of her life and self-care.  She reports feeling pleased with her progress in PT and she plans to resume a home strengthening program too with her gym equipment and rower moving forward.  This course of PT will be discharged today.  I would be glad to work with Barnie again if she needs PT in the future.     OBJECTIVE IMPAIRMENTS: Abnormal gait, decreased activity tolerance, decreased balance, decreased endurance, decreased strength, and impaired perceived functional ability.   ACTIVITY LIMITATIONS: standing, transfers, and locomotion level  PARTICIPATION LIMITATIONS: cleaning, interpersonal relationship, shopping, community activity, and yard work  PERSONAL FACTORS: Age, Time since onset of injury/illness/exacerbation, and 1-2 comorbidities: long COVID, orthopedic hx (see above) are also affecting patient's functional outcome.   REHAB POTENTIAL: Good  CLINICAL DECISION MAKING: Evolving/moderate complexity  EVALUATION COMPLEXITY: Moderate  PLAN:  PT FREQUENCY: 1-2x/week  PT DURATION: 4 weeks  PLANNED INTERVENTIONS: 97110-Therapeutic exercises, 97530- Therapeutic activity, [id number]- Neuromuscular re-education, 97535-  Self Care, and [id number]- Manual therapy  PLAN FOR NEXT SESSION: D/C PT  Vernell Reges, PT, DPT, OCS  09/30/2023, 9:48 AM

## 2023-10-02 ENCOUNTER — Ambulatory Visit

## 2023-10-05 ENCOUNTER — Ambulatory Visit

## 2023-10-07 ENCOUNTER — Ambulatory Visit

## 2023-10-12 ENCOUNTER — Encounter

## 2023-10-14 ENCOUNTER — Encounter

## 2023-10-16 ENCOUNTER — Other Ambulatory Visit (INDEPENDENT_AMBULATORY_CARE_PROVIDER_SITE_OTHER)

## 2023-10-16 DIAGNOSIS — E0590 Thyrotoxicosis, unspecified without thyrotoxic crisis or storm: Secondary | ICD-10-CM | POA: Diagnosis not present

## 2023-10-16 DIAGNOSIS — R739 Hyperglycemia, unspecified: Secondary | ICD-10-CM | POA: Diagnosis not present

## 2023-10-16 DIAGNOSIS — E7800 Pure hypercholesterolemia, unspecified: Secondary | ICD-10-CM

## 2023-10-16 LAB — HEPATIC FUNCTION PANEL
ALT: 23 U/L (ref 0–35)
AST: 22 U/L (ref 0–37)
Albumin: 4.5 g/dL (ref 3.5–5.2)
Alkaline Phosphatase: 113 U/L (ref 39–117)
Bilirubin, Direct: 0.1 mg/dL (ref 0.0–0.3)
Total Bilirubin: 0.6 mg/dL (ref 0.2–1.2)
Total Protein: 6.5 g/dL (ref 6.0–8.3)

## 2023-10-16 LAB — LIPID PANEL
Cholesterol: 214 mg/dL — ABNORMAL HIGH (ref 0–200)
HDL: 60.3 mg/dL (ref >39.00)
LDL Cholesterol: 124 mg/dL — ABNORMAL HIGH (ref 0–99)
NonHDL: 153.27
Total CHOL/HDL Ratio: 4
Triglycerides: 147 mg/dL (ref 0.0–149.0)
VLDL: 29.4 mg/dL (ref 0.0–40.0)

## 2023-10-16 LAB — BASIC METABOLIC PANEL WITH GFR
BUN: 19 mg/dL (ref 6–23)
CO2: 30 meq/L (ref 19–32)
Calcium: 9.2 mg/dL (ref 8.4–10.5)
Chloride: 103 meq/L (ref 96–112)
Creatinine, Ser: 1.06 mg/dL (ref 0.40–1.20)
GFR: 55.03 mL/min — ABNORMAL LOW (ref >60.00)
Glucose, Bld: 92 mg/dL (ref 70–99)
Potassium: 4.8 meq/L (ref 3.5–5.1)
Sodium: 138 meq/L (ref 135–145)

## 2023-10-16 LAB — TSH: TSH: 4.75 u[IU]/mL (ref 0.35–5.50)

## 2023-10-16 LAB — HEMOGLOBIN A1C: Hgb A1c MFr Bld: 6 % (ref 4.6–6.5)

## 2023-10-18 ENCOUNTER — Other Ambulatory Visit: Payer: Self-pay | Admitting: Internal Medicine

## 2023-10-19 ENCOUNTER — Ambulatory Visit: Payer: Self-pay | Admitting: Internal Medicine

## 2023-10-19 ENCOUNTER — Encounter

## 2023-10-21 ENCOUNTER — Encounter

## 2023-10-22 ENCOUNTER — Ambulatory Visit (INDEPENDENT_AMBULATORY_CARE_PROVIDER_SITE_OTHER): Admitting: Internal Medicine

## 2023-10-22 VITALS — BP 126/74 | HR 64 | Resp 16 | Ht 67.0 in | Wt 168.0 lb

## 2023-10-22 DIAGNOSIS — R739 Hyperglycemia, unspecified: Secondary | ICD-10-CM | POA: Diagnosis not present

## 2023-10-22 DIAGNOSIS — E7800 Pure hypercholesterolemia, unspecified: Secondary | ICD-10-CM

## 2023-10-22 DIAGNOSIS — R2681 Unsteadiness on feet: Secondary | ICD-10-CM

## 2023-10-22 DIAGNOSIS — R944 Abnormal results of kidney function studies: Secondary | ICD-10-CM

## 2023-10-22 DIAGNOSIS — F439 Reaction to severe stress, unspecified: Secondary | ICD-10-CM

## 2023-10-22 DIAGNOSIS — E0590 Thyrotoxicosis, unspecified without thyrotoxic crisis or storm: Secondary | ICD-10-CM

## 2023-10-22 DIAGNOSIS — F331 Major depressive disorder, recurrent, moderate: Secondary | ICD-10-CM

## 2023-10-22 DIAGNOSIS — K219 Gastro-esophageal reflux disease without esophagitis: Secondary | ICD-10-CM

## 2023-10-22 MED ORDER — PANTOPRAZOLE SODIUM 40 MG PO TBEC: 40.0000 mg | DELAYED_RELEASE_TABLET | Freq: Every day | ORAL | 1 refills | Status: DC

## 2023-10-22 NOTE — Progress Notes (Signed)
 Subjective:    Patient ID: Amber Richardson, female    DOB: 03-07-1955, 68 y.o.   MRN: [id number]  Patient here for  Chief Complaint  Patient presents with   Medical Management of Chronic Issues    HPI Here for a scheduled follow up - follow up regarding increased stress and hypercholesterolemia.  Saw neurology 05/13/23 - for evaluation of unsteady gait. Recommended MRI brain, MRI cervical spine and EMG/NCS. Recommended continuing PT and consideration of ENT evaluation or vestibular rehab if no neurological causes identified. MRI brain 05/25/23 - normal. MRI c-spine - Multilevel degenerative changes without significant spinal canal stenosis.  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 Was also evaluated by ortho 04/23/23 - subacromial impingement (left) - recommended PT. Saw GI 11/07/22 - recommended EGD and colonoscopy. Colonoscopy 12/22/22 - non bleeding internal hemorrhoids and diverticulosis. EGD - gastritis - s/p dilation. Recommended f/u colonoscopy in 10 years. Has been going to PT. NCS 07/08/23 -mildly abnormal with evidence of a mild sensorimotor polyneuropathy. Had f/u with neurology - 07/20/23 - referred to ENT. Recommended CTA head and neck - performed 09/09/23 - no brain abnormality present. No intracranial large vessel occlusion and no hemodynamically significant arterial stenosis in the neck. She does feel some improvement with PT. Has an appt with ENT ([facility]) scheduled. Increased stress. Stress with family issues - discussed. Seeing a Veterinary surgeon.    Past Medical History:  Diagnosis Date   Allergies    Basal cell carcinoma    Fatigue 08/30/2014   Generalized anxiety disorder 03/21/2011   GERD (gastroesophageal reflux disease)    History of COVID-19 02/22/2019   HLD (hyperlipidemia)    HSV infection 09/20/2014   Hypercholesterolemia 02/08/2015   Hyperglycemia 08/28/2019   Hyperthyroidism    Major depressive  disorder    Osteoporosis 05/13/2015   Pyelonephritis 1985   Scalp, arm, and leg tingling 08/28/2019   Tendinosis 09/12/2013   Rotator cuff tendinosis. Mild capsulitis.   Tremor 08/28/2019   Subtle to mild, upper extremities, left worse than right   Past Surgical History:  Procedure Laterality Date   DILATION AND CURETTAGE OF UTERUS     Miscarriage     x1   TONSILLECTOMY  1963   Family History  Problem Relation Age of Onset   Hypertension Mother    Hypercholesterolemia Mother    Heart disease Father    Hyperlipidemia Father    Hypercholesterolemia Father    Stroke Father    Dementia Father        Vascular dementia related to stroke history   Social History   Socioeconomic History   Marital status: Married    Spouse name: Not on file   Number of children: 0   Years of education: 16   Highest education level: Bachelor's degree (e.g., BA, AB, BS)  Occupational History   Not on file  Tobacco Use   Smoking status: Never   Smokeless tobacco: Never  Vaping Use   Vaping status: Never Used  Substance and Sexual Activity   Alcohol use: No    Alcohol/week: 0.0 standard drinks of alcohol   Drug use: No   Sexual activity: Not on file  Other Topics Concern   Not on file  Social History Narrative   Right handed   Drinks one cup coffee am and one pepsi   Social Drivers of Health   Financial Resource Strain: Low Risk  (08/03/2023)   Overall Physicist, medical Strain (  CARDIA)    Difficulty of Paying Living Expenses: Not hard at all  Food Insecurity: No Food Insecurity (08/03/2023)   Hunger Vital Sign    Worried About Running Out of Food in the Last Year: Never true    Ran Out of Food in the Last Year: Never true  Transportation Needs: No Transportation Needs (08/03/2023)   PRAPARE - Administrator, Civil Service (Medical): No    Lack of Transportation (Non-Medical): No  Physical Activity: Insufficiently Active (08/03/2023)   Exercise Vital Sign    Days of Exercise  per Week: 3 days    Minutes of Exercise per Session: 30 min  Stress: No Stress Concern Present (08/03/2023)   [employer] of Occupational Health - Occupational Stress Questionnaire    Feeling of Stress: Not at all  Social Connections: Moderately Isolated (08/03/2023)   Social Connection and Isolation Panel    Frequency of Communication with Friends and Family: Three times a week    Frequency of Social Gatherings with Friends and Family: Never    Attends Religious Services: Never    Database administrator or Organizations: No    Attends Engineer, structural: Not on file    Marital Status: Married     Review of Systems  Constitutional:  Negative for appetite change and unexpected weight change.  HENT:  Negative for congestion and sinus pressure.   Respiratory:  Negative for cough, chest tightness and shortness of breath.   Cardiovascular:  Negative for chest pain, palpitations and leg swelling.  Gastrointestinal:  Negative for abdominal pain, diarrhea, nausea and vomiting.  Genitourinary:  Negative for difficulty urinating and dysuria.  Musculoskeletal:  Negative for joint swelling.  Skin:  Negative for color change and rash.  Neurological:  Negative for dizziness and headaches.  Psychiatric/Behavioral:         Increased stress as outlined.        Objective:     BP 126/74   Pulse 64   Resp 16   Ht 5' 7 (1.702 m)   Wt 168 lb (76.2 kg)   LMP 01/13/2008   SpO2 98%   BMI 26.31 kg/m  Wt Readings from Last 3 Encounters:  10/22/23 168 lb (76.2 kg)  08/07/23 168 lb 3.2 oz (76.3 kg)  06/02/23 165 lb (74.8 kg)    Physical Exam Vitals reviewed.  Constitutional:      General: She is not in acute distress.    Appearance: Normal appearance.  HENT:     Head: Normocephalic and atraumatic.     Right Ear: External ear normal.     Left Ear: External ear normal.     Mouth/Throat:     Pharynx: No oropharyngeal exudate or posterior oropharyngeal erythema.  Eyes:      General: No scleral icterus.       Right eye: No discharge.        Left eye: No discharge.     Conjunctiva/sclera: Conjunctivae normal.  Neck:     Thyroid : No thyromegaly.  Cardiovascular:     Rate and Rhythm: Normal rate and regular rhythm.  Pulmonary:     Effort: No respiratory distress.     Breath sounds: Normal breath sounds. No wheezing.  Abdominal:     General: Bowel sounds are normal.     Palpations: Abdomen is soft.     Tenderness: There is no abdominal tenderness.  Musculoskeletal:        General: No swelling or tenderness.  Cervical back: Neck supple. No tenderness.  Lymphadenopathy:     Cervical: No cervical adenopathy.  Skin:    Findings: No erythema or rash.  Neurological:     Mental Status: She is alert.  Psychiatric:        Mood and Affect: Mood normal.        Behavior: Behavior normal.         Outpatient Encounter Medications as of 10/22/2023  Medication Sig   albuterol  (VENTOLIN  HFA) 108 (90 Base) MCG/ACT inhaler TAKE 2 PUFFS BY MOUTH EVERY 6 HOURS AS NEEDED FOR WHEEZE OR SHORTNESS OF BREATH   ALPRAZolam (XANAX) 0.5 MG tablet Take by mouth.   Calcium  Citrate-Vitamin D  (CITRACAL + D PO) Take by mouth. Take one in the morning & 2 in the afternoon   diclofenac (VOLTAREN) 50 MG EC tablet Take 50 mg by mouth 2 (two) times daily.   DULoxetine (CYMBALTA) 20 MG capsule Take 20 mg by mouth daily.   DULoxetine (CYMBALTA) 60 MG capsule Take 60 mg by mouth daily.   Evolocumab  (REPATHA  SURECLICK) 140 MG/ML SOAJ INJECT 140 MG INTO THE SKIN EVERY 14 (FOURTEEN) DAYS.   fluticasone  (FLONASE ) 50 MCG/ACT nasal spray SPRAY 2 SPRAYS INTO EACH NOSTRIL EVERY DAY   gabapentin (NEURONTIN) 300 MG capsule Take 300 mg by mouth 3 (three) times daily.   ipratropium (ATROVENT ) 0.03 % nasal spray PLACE 2 SPRAYS INTO BOTH NOSTRILS EVERY 12 (TWELVE) HOURS.   levothyroxine  (SYNTHROID ) 75 MCG tablet Take 1 tablet (75 mcg total) by mouth daily.   Multiple Vitamin (MULTIVITAMIN) tablet  Take 1 tablet by mouth daily.   pantoprazole  (PROTONIX ) 40 MG tablet Take 1 tablet (40 mg total) by mouth daily.   traZODone (DESYREL) 50 MG tablet 50 mg. Takes 1/2 tab po prn   triamcinolone  cream (KENALOG ) 0.1 % Apply 1 application. topically 2 (two) times daily. Until rash resolves   valACYclovir (VALTREX) 500 MG tablet Take one to two tablets by mouth daily   vitamin B-12 (CYANOCOBALAMIN) 1000 MCG tablet Take 1 tablet (1,000 mcg total) by mouth daily.   [DISCONTINUED] pantoprazole  (PROTONIX ) 40 MG tablet Take 1 tablet (40 mg total) by mouth daily.   No facility-administered encounter medications on file as of 10/22/2023.     Lab Results  Component Value Date   WBC 4.8 06/02/2023   HGB 13.0 06/02/2023   HCT 39.9 06/02/2023   PLT 260.0 06/02/2023   GLUCOSE 92 10/16/2023   CHOL 214 (H) 10/16/2023   TRIG 147.0 10/16/2023   HDL 60.30 10/16/2023   LDLDIRECT 213.0 05/27/2022   LDLCALC 124 (H) 10/16/2023   ALT 23 10/16/2023   AST 22 10/16/2023   NA 138 10/16/2023   K 4.8 10/16/2023   CL 103 10/16/2023   CREATININE 1.06 10/16/2023   BUN 19 10/16/2023   CO2 30 10/16/2023   TSH 4.75 10/16/2023   HGBA1C 6.0 10/16/2023    CT HEAD WO CONTRAST ( ) Result Date: 06/02/2023 CLINICAL DATA:  Clemens with trauma to the head yesterday. EXAM: CT HEAD WITHOUT CONTRAST TECHNIQUE: Contiguous axial images were obtained from the base of the skull through the vertex without intravenous contrast. RADIATION DOSE REDUCTION: This exam was performed according to the departmental dose-optimization program which includes automated exposure control, adjustment of the mA and/or kV according to patient size and/or use of iterative reconstruction technique. COMPARISON:  06/20/2020 FINDINGS: Brain: The brain shows a normal appearance without evidence of malformation, atrophy, old or acute small or large vessel infarction, mass lesion,  hemorrhage, hydrocephalus or extra-axial collection. Vascular: No hyperdense vessel.  No evidence of atherosclerotic calcification. Skull: Normal.  No traumatic finding.  No focal bone lesion. Sinuses/Orbits: Sinuses are clear. Orbits appear normal. Mastoids are clear. Other: None significant IMPRESSION: Normal head CT. Electronically Signed   By: Oneil Officer M.D.   On: 06/02/2023 13:08       Assessment & Plan:  Hyperthyroidism Assessment & Plan: Off tapazole. On synthroid . Follow tsh.   Orders: -     TSH; Future  Hypercholesterolemia Assessment & Plan: Low cholesterol diet and exercise as tolerated.  Off crestor  due to intolerance.  On repatha . Follow lipid panel.   Orders: -     Hepatic function panel; Future -     Basic metabolic panel with GFR; Future -     Lipid panel; Future  Hyperglycemia Assessment & Plan: Low carb diet and exercise. Follow met b and A1c.   Orders: -     Basic metabolic panel with GFR; Future -     Hemoglobin A1c; Future  Decreased GFR Assessment & Plan: Recent lab- GFR 55.  Try to minimize antiinflammatory medication. Will decrease voltaren to q day and add tylenol arthritis. Stay hydrated. Recheck metabolic panel soon..   Orders: -     Basic metabolic panel with GFR; Future  Unsteady gait Assessment & Plan: Persistent balance issues as outlined.   Saw neurology 05/13/23 - for evaluation of unsteady gait. Recommended MRI brain, MRI cervical spine and EMG/NCS. Recommended continuing PT and consideration of ENT evaluation or vestibular rehab if no neurological causes identified. MRI brain 05/25/23 - normal. MRI c-spine - Multilevel degenerative changes without significant spinal canal stenosis.  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 Was also evaluated by ortho 04/23/23 - subacromial impingement (left) - recommended PT. Has been going to PT. NCS 07/08/23 -mildly abnormal with evidence of a mild sensorimotor polyneuropathy. Had f/u with neurology - 07/20/23 - referred to  ENT. Recommended CTA head and neck - performed 09/09/23 - no brain abnormality present. No intracranial large vessel occlusion and no hemodynamically significant arterial stenosis in the neck. She does feel some improvement with PT. Continue PT and home exercise. Follow.    Stress Assessment & Plan: Increased stress. Discussed. Seeing psychiatrist. Continues on cymbalta and trazodone - per psych. Follow. Discussed social work referral for her brother - to help look at placement options, etc.    Moderate episode of recurrent major depressive disorder [facility]) Assessment & Plan: Followed by psychiatry. Continues on cymbalta and trazodone. Follow.    Gastroesophageal reflux disease, unspecified whether esophagitis present Assessment & Plan: EGD 12/22/22 - pathology - Barretts - negative dysplasia.  (Gastritis noted on EGD).  No upper symptoms reported today. On protonix .    Other orders -     Pantoprazole  Sodium; Take 1 tablet (40 mg total) by mouth daily.  Dispense: 90 tablet; Refill: 1     Allena Hamilton, MD

## 2023-10-22 NOTE — Assessment & Plan Note (Signed)
 Low cholesterol diet and exercise as tolerated.  Off crestor due to intolerance.  On repatha. Follow lipid panel.

## 2023-10-26 ENCOUNTER — Encounter: Payer: Self-pay | Admitting: Internal Medicine

## 2023-10-26 ENCOUNTER — Encounter

## 2023-10-26 NOTE — Assessment & Plan Note (Signed)
 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  (Gastritis noted on EGD).  No upper symptoms reported today. On protonix .

## 2023-10-26 NOTE — Assessment & Plan Note (Signed)
 Persistent balance issues as outlined.   Saw neurology 05/13/23 - for evaluation of unsteady gait. Recommended MRI brain, MRI cervical spine and EMG/NCS. Recommended continuing PT and consideration of ENT evaluation or vestibular rehab if no neurological causes identified. MRI brain 05/25/23 - normal. MRI c-spine - Multilevel degenerative changes without significant spinal canal stenosis.  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 Was also evaluated by ortho 04/23/23 - subacromial impingement (left) - recommended PT. Has been going to PT. NCS 07/08/23 -mildly abnormal with evidence of a mild sensorimotor polyneuropathy. Had f/u with neurology - 07/20/23 - referred to ENT. Recommended CTA head and neck - performed 09/09/23 - no brain abnormality present. No intracranial large vessel occlusion and no hemodynamically significant arterial stenosis in the neck. She does feel some improvement with PT. Continue PT and home exercise. Follow.

## 2023-10-26 NOTE — Assessment & Plan Note (Signed)
 Increased stress. Discussed. Seeing psychiatrist. Continues on cymbalta and trazodone - per psych. Follow. Discussed social work referral for her brother - to help look at placement options, etc.

## 2023-10-26 NOTE — Assessment & Plan Note (Signed)
 Low-carb diet and exercise.  Follow met b and A1c.

## 2023-10-26 NOTE — Assessment & Plan Note (Signed)
Off tapazole.  On synthroid.  Follow tsh.  °

## 2023-10-26 NOTE — Assessment & Plan Note (Signed)
 Followed by psychiatry. Continues on cymbalta and trazodone. Follow.

## 2023-10-26 NOTE — Assessment & Plan Note (Signed)
 Recent lab- GFR 55.  Try to minimize antiinflammatory medication. Will decrease voltaren to q day and add tylenol arthritis. Stay hydrated. Recheck metabolic panel soon.Amber Richardson

## 2023-10-27 ENCOUNTER — Encounter

## 2023-10-28 ENCOUNTER — Encounter

## 2023-11-02 ENCOUNTER — Encounter: Admitting: Internal Medicine

## 2023-11-05 ENCOUNTER — Other Ambulatory Visit

## 2023-11-11 ENCOUNTER — Other Ambulatory Visit (INDEPENDENT_AMBULATORY_CARE_PROVIDER_SITE_OTHER)

## 2023-11-11 DIAGNOSIS — R944 Abnormal results of kidney function studies: Secondary | ICD-10-CM

## 2023-11-11 LAB — BASIC METABOLIC PANEL WITH GFR
BUN: 17 mg/dL (ref 6–23)
CO2: 28 meq/L (ref 19–32)
Calcium: 8.7 mg/dL (ref 8.4–10.5)
Chloride: 101 meq/L (ref 96–112)
Creatinine, Ser: 1.02 mg/dL (ref 0.40–1.20)
GFR: 57.6 mL/min — ABNORMAL LOW (ref >60.00)
Glucose, Bld: 98 mg/dL (ref 70–99)
Potassium: 4.4 meq/L (ref 3.5–5.1)
Sodium: 136 meq/L (ref 135–145)

## 2023-11-12 ENCOUNTER — Ambulatory Visit: Payer: Self-pay | Admitting: Internal Medicine

## 2023-11-12 ENCOUNTER — Other Ambulatory Visit

## 2023-11-30 ENCOUNTER — Other Ambulatory Visit: Payer: Self-pay | Admitting: Internal Medicine

## 2023-12-13 ENCOUNTER — Encounter: Payer: Self-pay | Admitting: *Deleted

## 2023-12-14 ENCOUNTER — Encounter: Admitting: Internal Medicine

## 2023-12-14 DIAGNOSIS — E0590 Thyrotoxicosis, unspecified without thyrotoxic crisis or storm: Secondary | ICD-10-CM

## 2023-12-14 DIAGNOSIS — R739 Hyperglycemia, unspecified: Secondary | ICD-10-CM

## 2023-12-14 DIAGNOSIS — R944 Abnormal results of kidney function studies: Secondary | ICD-10-CM

## 2023-12-14 DIAGNOSIS — E7800 Pure hypercholesterolemia, unspecified: Secondary | ICD-10-CM

## 2023-12-17 ENCOUNTER — Encounter: Payer: Self-pay | Admitting: Internal Medicine

## 2023-12-17 ENCOUNTER — Ambulatory Visit (INDEPENDENT_AMBULATORY_CARE_PROVIDER_SITE_OTHER): Admitting: Internal Medicine

## 2023-12-17 VITALS — BP 110/70 | HR 60 | Temp 97.2°F | Ht 67.0 in | Wt 168.2 lb

## 2023-12-17 DIAGNOSIS — K219 Gastro-esophageal reflux disease without esophagitis: Secondary | ICD-10-CM

## 2023-12-17 DIAGNOSIS — M810 Age-related osteoporosis without current pathological fracture: Secondary | ICD-10-CM

## 2023-12-17 DIAGNOSIS — F331 Major depressive disorder, recurrent, moderate: Secondary | ICD-10-CM

## 2023-12-17 DIAGNOSIS — Z1231 Encounter for screening mammogram for malignant neoplasm of breast: Secondary | ICD-10-CM

## 2023-12-17 DIAGNOSIS — Z0000 Encounter for general adult medical examination without abnormal findings: Secondary | ICD-10-CM

## 2023-12-17 DIAGNOSIS — E7800 Pure hypercholesterolemia, unspecified: Secondary | ICD-10-CM

## 2023-12-17 DIAGNOSIS — R739 Hyperglycemia, unspecified: Secondary | ICD-10-CM

## 2023-12-17 DIAGNOSIS — E0590 Thyrotoxicosis, unspecified without thyrotoxic crisis or storm: Secondary | ICD-10-CM | POA: Diagnosis not present

## 2023-12-17 DIAGNOSIS — F439 Reaction to severe stress, unspecified: Secondary | ICD-10-CM

## 2023-12-17 DIAGNOSIS — R944 Abnormal results of kidney function studies: Secondary | ICD-10-CM | POA: Diagnosis not present

## 2023-12-17 DIAGNOSIS — Z13820 Encounter for screening for osteoporosis: Secondary | ICD-10-CM

## 2023-12-17 LAB — BASIC METABOLIC PANEL WITH GFR
BUN: 22 mg/dL (ref 6–23)
CO2: 31 meq/L (ref 19–32)
Calcium: 9.4 mg/dL (ref 8.4–10.5)
Chloride: 100 meq/L (ref 96–112)
Creatinine, Ser: 1.01 mg/dL (ref 0.40–1.20)
GFR: 58.25 mL/min — ABNORMAL LOW (ref >60.00)
Glucose, Bld: 98 mg/dL (ref 70–99)
Potassium: 5.1 meq/L (ref 3.5–5.1)
Sodium: 137 meq/L (ref 135–145)

## 2023-12-17 LAB — TSH: TSH: 2.76 u[IU]/mL (ref 0.35–5.50)

## 2023-12-17 NOTE — Progress Notes (Signed)
 Chief Complaint  Patient presents with   Medicare Wellness    Welcome to Medicare     Subjective:   Amber Richardson is a 68 y.o. female who presents for a Welcome to Medicare Exam.   Allergies (verified) Levaquin [levofloxacin in d5w], Crestor  [rosuvastatin ], Sertraline, and Sulfa antibiotics   History: Past Medical History:  Diagnosis Date   Allergies    Basal cell carcinoma    Fatigue 08/30/2014   Generalized anxiety disorder 03/21/2011   GERD (gastroesophageal reflux disease)    History of COVID-19 02/22/2019   HLD (hyperlipidemia)    HSV infection 09/20/2014   Hypercholesterolemia 02/08/2015   Hyperglycemia 08/28/2019   Hyperthyroidism    Major depressive disorder    Osteoporosis 05/13/2015   Pyelonephritis 1985   Scalp, arm, and leg tingling 08/28/2019   Tendinosis 09/12/2013   Rotator cuff tendinosis. Mild capsulitis.   Tremor 08/28/2019   Subtle to mild, upper extremities, left worse than right   Past Surgical History:  Procedure Laterality Date   DILATION AND CURETTAGE OF UTERUS     Miscarriage     x1   TONSILLECTOMY  1963   Family History  Problem Relation Age of Onset   Hypertension Mother    Hypercholesterolemia Mother    Heart disease Father    Hyperlipidemia Father    Hypercholesterolemia Father    Stroke Father    Dementia Father        Vascular dementia related to stroke history   Social History   Occupational History   Not on file  Tobacco Use   Smoking status: Never   Smokeless tobacco: Never  Vaping Use   Vaping status: Never Used  Substance and Sexual Activity   Alcohol use: No    Alcohol/week: 0.0 standard drinks of alcohol   Drug use: No   Sexual activity: Not on file   Tobacco Counseling Does not smoke.   SDOH Screenings   Food Insecurity: No Food Insecurity (12/17/2023)  Housing: Low Risk  (12/17/2023)  Transportation Needs: No Transportation Needs (12/17/2023)  Utilities: Not At Risk (04/20/2023)   Received from  [facility]  Alcohol Screen: Low Risk  (08/03/2023)  Depression (PHQ2-9): Low Risk  (12/17/2023)  Financial Resource Strain: Low Risk  (08/03/2023)  Physical Activity: Insufficiently Active (12/17/2023)  Social Connections: Moderately Isolated (12/17/2023)  Stress: Stress Concern Present (12/17/2023)  Tobacco Use: Low Risk  (12/26/2023)   See flowsheets for full screening details  Depression Screen PHQ 2 & 9 Depression Scale- Over the past 2 weeks, how often have you been bothered by any of the following problems? Little interest or pleasure in doing things: 0 Feeling down, depressed, or hopeless (PHQ Adolescent also includes...irritable): 0 PHQ-2 Total Score: 0 Trouble falling or staying asleep, or sleeping too much: 0 Feeling tired or having little energy: 0 Poor appetite or overeating (PHQ Adolescent also includes...weight loss): 0 Feeling bad about yourself - or that you are a failure or have let yourself or your family down: 0 Trouble concentrating on things, such as reading the newspaper or watching television (PHQ Adolescent also includes...like school work): 0 Moving or speaking so slowly that other people could have noticed. Or the opposite - being so fidgety or restless that you have been moving around a lot more than usual: 0 Thoughts that you would be better off dead, or of hurting yourself in some way: 0 PHQ-9 Total Score: 0 If you checked off any problems, how difficult have these problems made  it for you to do your work, take care of things at home, or get along with other people?: Not difficult at all      Visit info / Clinical Intake: Medicare Wellness Visit Type:: Welcome to [employer]ERN[employer] OFFICER) Interpreter Needed?: No Pre-visit prep was completed: yes AWV questionnaire completed by patient prior to visit?: no Living arrangements:: with family/others Patient's Overall Health Status Rating: good Typical amount of pain: none Does pain affect daily life?:  no Are you currently prescribed opioids?: no  Dietary Habits and Nutritional Risks How many meals a day?: 3 Eats fruit and vegetables daily?: yes Most meals are obtained by: preparing own meals In the last 2 weeks, have you had any of the following?: unintentional weight gain Diabetic:: no  Functional Status Activities of Daily Living (to include ambulation/medication): Independent Ambulation: Independent Medication Administration: Independent Home Management: Independent Manage your own finances?: yes Primary transportation is: driving Concerns about vision?: (!) yes Concerns about hearing?: no  Fall Screening Falls in the past year?: 1 Number of falls in past year: 0 Was there an injury with Fall?: 0 Fall Risk Category Calculator: 1 Patient Fall Risk Level: Low Fall Risk  Fall Risk Patient at Risk for Falls Due to: No Fall Risks  Home and Transportation Safety: All rugs have non-skid backing?: yes All stairs or steps have railings?: yes Grab bars in the bathtub or shower?: (!) no Have non-skid surface in bathtub or shower?: (!) no Good home lighting?: yes Regular seat belt use?: yes Hospital stays in the last year:: no  Cognitive Assessment Difficulty concentrating, remembering, or making decisions? : no Will 6CIT or Mini Cog be Completed: no 6CIT or Mini Cog Declined: patient declined  Advance Directives (For Healthcare) Does Patient Have a Medical Advance Directive?: Yes Does patient want to make changes to medical advance directive?: Yes (ED - send information to MyChart) Type of Advance Directive: Healthcare Power of [place]; Living will Copy of Healthcare Power of Attorney in Chart?: No - copy requested Copy of Living Will in Chart?: No - copy requested  Reviewed/Updated  Reviewed/Updated: Medical History; Family History; Medications; Allergies; Patient Goals; Surgical History; Care Teams   Discussed vaccines. Discussed pneumonia vaccine and shingles  vaccine. Discussed living will/MOST Form/HCPOA. Discussed hearing evaluation - hearing aids. Continue t/u.       Objective:    Today's Vitals   12/17/23 1126  BP: 110/70  Pulse: 60  Temp: (!) 97.2 F (36.2 C)  TempSrc: Oral  SpO2: 96%  Weight: 168 lb 4 oz (76.3 kg)  Height: 5' 7 (1.702 m)   Body mass index is 26.35 kg/m.   Physical Exam Constitutional:      General: She is not in acute distress.    Appearance: Normal appearance.  HENT:     Head: Normocephalic and atraumatic.     Mouth/Throat:     Pharynx: No oropharyngeal exudate or posterior oropharyngeal erythema.  Cardiovascular:     Rate and Rhythm: Normal rate and regular rhythm.  Pulmonary:     Effort: No respiratory distress.     Breath sounds: Normal breath sounds.  Abdominal:     General: Bowel sounds are normal.     Palpations: Abdomen is soft.     Tenderness: There is no abdominal tenderness.  Musculoskeletal:        General: No swelling or tenderness.     Cervical back: Neck supple. No tenderness.  Lymphadenopathy:     Cervical: No cervical adenopathy.  Skin:  Findings: No erythema or rash.  Neurological:     Mental Status: She is alert.  Psychiatric:        Mood and Affect: Mood normal.        Behavior: Behavior normal.     Current Medications (verified) Outpatient Encounter Medications as of 12/17/2023  Medication Sig   albuterol  (VENTOLIN  HFA) 108 (90 Base) MCG/ACT inhaler TAKE 2 PUFFS BY MOUTH EVERY 6 HOURS AS NEEDED FOR WHEEZE OR SHORTNESS OF BREATH   ALPRAZolam (XANAX) 0.5 MG tablet Take by mouth.   Calcium  Citrate-Vitamin D  (CITRACAL + D PO) Take by mouth. Take one in the morning & 2 in the afternoon   diclofenac (VOLTAREN) 50 MG EC tablet Take 50 mg by mouth 2 (two) times daily.   DULoxetine (CYMBALTA) 20 MG capsule Take 20 mg by mouth daily.   DULoxetine (CYMBALTA) 60 MG capsule Take 60 mg by mouth daily.   fluticasone  (FLONASE ) 50 MCG/ACT nasal spray SPRAY 2 SPRAYS INTO EACH NOSTRIL  EVERY DAY   gabapentin (NEURONTIN) 300 MG capsule Take 300 mg by mouth 3 (three) times daily.   ipratropium (ATROVENT ) 0.03 % nasal spray PLACE 2 SPRAYS INTO BOTH NOSTRILS EVERY 12 (TWELVE) HOURS.   Multiple Vitamin (MULTIVITAMIN) tablet Take 1 tablet by mouth daily.   traZODone (DESYREL) 50 MG tablet 50 mg. Takes 1/2 tab po prn   triamcinolone  cream (KENALOG ) 0.1 % Apply 1 application. topically 2 (two) times daily. Until rash resolves   valACYclovir (VALTREX) 500 MG tablet Take one to two tablets by mouth daily   vitamin B-12 (CYANOCOBALAMIN) 1000 MCG tablet Take 1 tablet (1,000 mcg total) by mouth daily.   Evolocumab  (REPATHA  SURECLICK) 140 MG/ML SOAJ Inject 140 mg into the skin every 14 (fourteen) days.   levothyroxine  (SYNTHROID ) 75 MCG tablet Take 1 tablet (75 mcg total) by mouth daily.   pantoprazole  (PROTONIX ) 40 MG tablet Take 1 tablet (40 mg total) by mouth daily.   [DISCONTINUED] Evolocumab  (REPATHA  SURECLICK) 140 MG/ML SOAJ INJECT 140 MG INTO THE SKIN EVERY 14 (FOURTEEN) DAYS.   [DISCONTINUED] levothyroxine  (SYNTHROID ) 75 MCG tablet TAKE 1 TABLET BY MOUTH EVERY DAY   [DISCONTINUED] pantoprazole  (PROTONIX ) 40 MG tablet Take 1 tablet (40 mg total) by mouth daily.   No facility-administered encounter medications on file as of 12/17/2023.   Hearing/Vision screen Vision Screening   Right eye Left eye Both eyes  Without correction     With correction 20 50 20 25 20 25    Immunizations and Health Maintenance Health Maintenance  Topic Date Due   HIV Screening  Never done   Hepatitis C Screening  Never done   Bone Density Scan  Never done   Mammogram  03/27/2023   COVID-19 Vaccine (3 - Pfizer risk series) 01/02/2024 (Originally 07/13/2019)   Zoster Vaccines- Shingrix (1 of 2) 01/21/2024 (Originally 01/14/1975)   Influenza Vaccine  04/26/2024 (Originally 08/28/2023)   Pneumococcal Vaccine: 50+ Years (1 of 1 - PCV) 06/01/2024 (Originally 01/15/2008)   Colonoscopy  06/01/2024  (Originally 09/09/2022)   Medicare Annual Wellness (AWV)  12/16/2024   Cervical Cancer Screening (HPV/Pap Cotest)  06/01/2025   Hepatitis B Vaccines 19-59 Average Risk  Aged Out   Meningococcal B Vaccine  Aged Out   DTaP/Tdap/Td  Discontinued    EKG: SR/SB. Ventricular rate 57. No acute ischemic changes.      Assessment/Plan:  This is a routine wellness examination for [place].  Patient Care Team: Glendia Shad, MD as PCP - General (Internal Medicine)  I have personally reviewed and noted the following in the patient's chart:   Medical and social history Use of alcohol, tobacco or illicit drugs  Current medications and supplements including opioid prescriptions. Functional ability and status Nutritional status Physical activity Advanced directives List of other physicians Hospitalizations, surgeries, and ER visits in previous 12 months Vitals Screenings to include cognitive, depression, and falls Referrals and appointments  Orders Placed This Encounter  Procedures   MM 3D SCREENING MAMMOGRAM BILATERAL BREAST    Standing Status:   Future    Expiration Date:   02/11/2025    Reason for Exam (SYMPTOM  OR DIAGNOSIS REQUIRED):   screening for breast cancer    Preferred imaging location?:   [facility]   DG Bone Density    Standing Status:   Future    Expiration Date:   12/25/2024    Reason for Exam (SYMPTOM  OR DIAGNOSIS REQUIRED):   estrogen deficiency.    Preferred imaging location?:   [facility]   TSH   Basic metabolic panel with GFR   EKG [id number]   In addition, I have reviewed and discussed with patient certain preventive protocols, quality metrics, and best practice recommendations. A written personalized care plan for preventive services as well as general preventive health recommendations were provided to patient.   Allena Hamilton, MD   12/26/2023   Return for keep scheduled.

## 2023-12-18 ENCOUNTER — Ambulatory Visit: Payer: Self-pay | Admitting: Internal Medicine

## 2023-12-21 ENCOUNTER — Other Ambulatory Visit: Payer: Self-pay | Admitting: Internal Medicine

## 2023-12-21 DIAGNOSIS — E875 Hyperkalemia: Secondary | ICD-10-CM

## 2023-12-21 NOTE — Telephone Encounter (Signed)
 Pt notified & lab appt scheduled (lab ordered)

## 2023-12-21 NOTE — Progress Notes (Signed)
Order placed for f/u potassium.

## 2023-12-26 ENCOUNTER — Encounter: Payer: Self-pay | Admitting: Internal Medicine

## 2023-12-26 MED ORDER — PANTOPRAZOLE SODIUM 40 MG PO TBEC: 40.0000 mg | DELAYED_RELEASE_TABLET | Freq: Every day | ORAL | 1 refills | Status: DC

## 2023-12-26 MED ORDER — REPATHA SURECLICK 140 MG/ML ~~LOC~~ SOAJ: 140.0000 mg | SUBCUTANEOUS | 3 refills | Status: AC

## 2023-12-26 MED ORDER — LEVOTHYROXINE SODIUM 75 MCG PO TABS
75.0000 ug | ORAL_TABLET | Freq: Every day | ORAL | 3 refills | Status: AC
Start: 2023-12-26 — End: ?

## 2023-12-26 NOTE — Assessment & Plan Note (Signed)
 Low cholesterol diet and exercise as tolerated.  Off crestor due to intolerance.  On repatha. Follow lipid panel.

## 2023-12-26 NOTE — Assessment & Plan Note (Signed)
 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  (Gastritis noted on EGD).  Continue protonix .

## 2023-12-26 NOTE — Assessment & Plan Note (Signed)
 Needs bone density.  See if agreeable to schedule with mammogram.

## 2023-12-26 NOTE — Assessment & Plan Note (Signed)
 Followed by psychiatry. Overall appears to be relatively stable - on cymbalta and trazodone. Continue f/u with psych.

## 2023-12-26 NOTE — Assessment & Plan Note (Signed)
 Low-carb diet and exercise.  Follow met b and A1c.

## 2023-12-26 NOTE — Assessment & Plan Note (Signed)
 Increased stress. Discussed. Seeing psychiatrist. Continues on cymbalta and trazodone - per psych. Overall appears to be stable. Follow.

## 2023-12-26 NOTE — Assessment & Plan Note (Signed)
 Off tapazole. On synthroid . Check tsh.

## 2023-12-28 ENCOUNTER — Other Ambulatory Visit: Payer: Self-pay | Admitting: *Deleted

## 2023-12-28 ENCOUNTER — Other Ambulatory Visit (INDEPENDENT_AMBULATORY_CARE_PROVIDER_SITE_OTHER)

## 2023-12-28 DIAGNOSIS — E7800 Pure hypercholesterolemia, unspecified: Secondary | ICD-10-CM

## 2023-12-28 DIAGNOSIS — E875 Hyperkalemia: Secondary | ICD-10-CM

## 2023-12-28 DIAGNOSIS — R739 Hyperglycemia, unspecified: Secondary | ICD-10-CM

## 2023-12-28 DIAGNOSIS — E0590 Thyrotoxicosis, unspecified without thyrotoxic crisis or storm: Secondary | ICD-10-CM | POA: Diagnosis not present

## 2023-12-28 LAB — POTASSIUM: Potassium: 5 meq/L (ref 3.5–5.1)

## 2023-12-28 NOTE — Addendum Note (Signed)
 Addended by: BRIEN SHARENE RAMAN on: 12/28/2023 01:01 PM   Modules accepted: Orders

## 2023-12-29 ENCOUNTER — Ambulatory Visit: Payer: Self-pay | Admitting: Internal Medicine

## 2023-12-29 ENCOUNTER — Other Ambulatory Visit: Payer: Self-pay | Admitting: Internal Medicine

## 2023-12-29 DIAGNOSIS — E875 Hyperkalemia: Secondary | ICD-10-CM

## 2023-12-29 NOTE — Progress Notes (Signed)
Order placed for f/u potassium check.  

## 2024-02-04 ENCOUNTER — Other Ambulatory Visit (INDEPENDENT_AMBULATORY_CARE_PROVIDER_SITE_OTHER)

## 2024-02-04 DIAGNOSIS — R739 Hyperglycemia, unspecified: Secondary | ICD-10-CM | POA: Diagnosis not present

## 2024-02-04 DIAGNOSIS — E875 Hyperkalemia: Secondary | ICD-10-CM

## 2024-02-04 DIAGNOSIS — E7800 Pure hypercholesterolemia, unspecified: Secondary | ICD-10-CM | POA: Diagnosis not present

## 2024-02-04 DIAGNOSIS — E0590 Thyrotoxicosis, unspecified without thyrotoxic crisis or storm: Secondary | ICD-10-CM | POA: Diagnosis not present

## 2024-02-04 LAB — HEPATIC FUNCTION PANEL
ALT: 17 U/L (ref 3–35)
AST: 20 U/L (ref 5–37)
Albumin: 4.6 g/dL (ref 3.5–5.2)
Alkaline Phosphatase: 117 U/L (ref 39–117)
Bilirubin, Direct: 0.1 mg/dL (ref 0.1–0.3)
Total Bilirubin: 0.6 mg/dL (ref 0.2–1.2)
Total Protein: 6.7 g/dL (ref 6.0–8.3)

## 2024-02-04 LAB — BASIC METABOLIC PANEL WITH GFR
BUN: 16 mg/dL (ref 6–23)
CO2: 30 meq/L (ref 19–32)
Calcium: 9.4 mg/dL (ref 8.4–10.5)
Chloride: 104 meq/L (ref 96–112)
Creatinine, Ser: 0.92 mg/dL (ref 0.40–1.20)
GFR: 65.09 mL/min
Glucose, Bld: 85 mg/dL (ref 70–99)
Potassium: 5.6 meq/L — ABNORMAL HIGH (ref 3.5–5.1)
Sodium: 138 meq/L (ref 135–145)

## 2024-02-04 LAB — LIPID PANEL
Cholesterol: 214 mg/dL — ABNORMAL HIGH (ref 28–200)
HDL: 69.7 mg/dL
LDL Cholesterol: 112 mg/dL — ABNORMAL HIGH (ref 10–99)
NonHDL: 144.46
Total CHOL/HDL Ratio: 3
Triglycerides: 164 mg/dL — ABNORMAL HIGH (ref 10.0–149.0)
VLDL: 32.8 mg/dL (ref 0.0–40.0)

## 2024-02-04 LAB — TSH: TSH: 4.23 u[IU]/mL (ref 0.35–5.50)

## 2024-02-04 LAB — HEMOGLOBIN A1C: Hgb A1c MFr Bld: 5.7 % (ref 4.6–6.5)

## 2024-02-04 NOTE — Addendum Note (Signed)
 Addended by: TRECIA JAMEE GRADE on: 02/04/2024 10:37 AM   Modules accepted: Orders

## 2024-02-05 ENCOUNTER — Other Ambulatory Visit
Admission: RE | Admit: 2024-02-05 | Discharge: 2024-02-05 | Disposition: A | Attending: Internal Medicine | Admitting: Internal Medicine

## 2024-02-05 ENCOUNTER — Ambulatory Visit: Payer: Self-pay | Admitting: Internal Medicine

## 2024-02-05 DIAGNOSIS — E875 Hyperkalemia: Secondary | ICD-10-CM | POA: Insufficient documentation

## 2024-02-05 LAB — POTASSIUM: Potassium: 4.4 mmol/L (ref 3.5–5.1)

## 2024-02-05 NOTE — Telephone Encounter (Signed)
Order placed for stat potassium.

## 2024-02-08 ENCOUNTER — Ambulatory Visit: Admitting: Internal Medicine

## 2024-02-08 ENCOUNTER — Encounter: Payer: Self-pay | Admitting: Internal Medicine

## 2024-02-08 VITALS — BP 126/76 | HR 71 | Temp 98.9°F | Ht 67.0 in | Wt 169.2 lb

## 2024-02-08 DIAGNOSIS — M810 Age-related osteoporosis without current pathological fracture: Secondary | ICD-10-CM | POA: Diagnosis not present

## 2024-02-08 DIAGNOSIS — K219 Gastro-esophageal reflux disease without esophagitis: Secondary | ICD-10-CM

## 2024-02-08 DIAGNOSIS — R739 Hyperglycemia, unspecified: Secondary | ICD-10-CM

## 2024-02-08 DIAGNOSIS — E7800 Pure hypercholesterolemia, unspecified: Secondary | ICD-10-CM

## 2024-02-08 DIAGNOSIS — F439 Reaction to severe stress, unspecified: Secondary | ICD-10-CM

## 2024-02-08 DIAGNOSIS — R070 Pain in throat: Secondary | ICD-10-CM

## 2024-02-08 DIAGNOSIS — E0590 Thyrotoxicosis, unspecified without thyrotoxic crisis or storm: Secondary | ICD-10-CM | POA: Diagnosis not present

## 2024-02-08 DIAGNOSIS — Z1231 Encounter for screening mammogram for malignant neoplasm of breast: Secondary | ICD-10-CM

## 2024-02-08 DIAGNOSIS — R519 Headache, unspecified: Secondary | ICD-10-CM | POA: Diagnosis not present

## 2024-02-08 DIAGNOSIS — F331 Major depressive disorder, recurrent, moderate: Secondary | ICD-10-CM

## 2024-02-08 DIAGNOSIS — R051 Acute cough: Secondary | ICD-10-CM | POA: Diagnosis not present

## 2024-02-08 DIAGNOSIS — R0981 Nasal congestion: Secondary | ICD-10-CM

## 2024-02-08 DIAGNOSIS — R6889 Other general symptoms and signs: Secondary | ICD-10-CM

## 2024-02-08 DIAGNOSIS — R2681 Unsteadiness on feet: Secondary | ICD-10-CM

## 2024-02-08 LAB — POCT INFLUENZA A/B
Influenza A, POC: NEGATIVE
Influenza B, POC: NEGATIVE

## 2024-02-08 LAB — POC COVID19 BINAXNOW: SARS Coronavirus 2 Ag: NEGATIVE

## 2024-02-08 MED ORDER — PANTOPRAZOLE SODIUM 40 MG PO TBEC: 40.0000 mg | DELAYED_RELEASE_TABLET | Freq: Every day | ORAL | 1 refills | Status: AC

## 2024-02-08 NOTE — Progress Notes (Signed)
 "  Subjective:    Patient ID: Amber Richardson, female    DOB: 1956-01-10, 69 y.o.   MRN: [id number]  Patient here for  Chief Complaint  Patient presents with   Medical Management of Chronic Issues   Sore Throat   Headache   Nasal Congestion    HPI Here for a scheduled follow up -follow up regarding increased stress and hypercholesterolemia.  Saw neurology 05/13/23 - for evaluation of unsteady gait. Recommended MRI brain, MRI cervical spine and EMG/NCS. Recommended continuing PT and consideration of ENT evaluation or vestibular rehab if no neurological causes identified. MRI brain 05/25/23 - normal. MRI c-spine - Multilevel degenerative changes without significant spinal canal stenosis.  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 Was also evaluated by ortho 04/23/23 - subacromial impingement (left) - recommended PT.  Saw GI 11/07/22 - recommended EGD and colonoscopy. Colonoscopy 12/22/22 - non bleeding internal hemorrhoids and diverticulosis. EGD - gastritis - s/p dilation. Recommended f/u colonoscopy in 10 years. Has been going to PT. NCS 07/08/23 -mildly abnormal with evidence of a mild sensorimotor polyneuropathy. Had f/u with neurology - 07/20/23 - referred to ENT. Recommended CTA head and neck - performed 09/09/23 - no brain abnormality present. No intracranial large vessel occlusion and no hemodynamically significant arterial stenosis in the neck. Saw ENT 11/09/23 - recommended to schedule vestibular screening clinic. Evaluated 01/27/24 - low suspicion for BPPV, vestibular migraine, meniere's or inner ear pathology. Recommended restart PT. Reports husband tested positive for influenza A. She reports cough. No fever. Sneezing yesterday. Some increased congestion. Runny nose and increased nasal congestion. Headache. Scratchy throat. Used flonase  - night before last. Atrovent  nasal spray. Breathing stable. Some nausea. No vomiting or  diarrhea.   Mammogram 01/26/24 - Birads I.   Past Medical History:  Diagnosis Date   Allergies    Basal cell carcinoma    Fatigue 08/30/2014   Generalized anxiety disorder 03/21/2011   GERD (gastroesophageal reflux disease)    History of COVID-19 02/22/2019   HLD (hyperlipidemia)    HSV infection 09/20/2014   Hypercholesterolemia 02/08/2015   Hyperglycemia 08/28/2019   Hyperthyroidism    Major depressive disorder    Neuromuscular disorder (HCC)    Fibromyalgia-Long COVID   Osteoporosis 05/13/2015   Pyelonephritis 1985   Scalp, arm, and leg tingling 08/28/2019   Tendinosis 09/12/2013   Rotator cuff tendinosis. Mild capsulitis.   Tremor 08/28/2019   Subtle to mild, upper extremities, left worse than right   Past Surgical History:  Procedure Laterality Date   DILATION AND CURETTAGE OF UTERUS     EYE SURGERY     Radial keratotomy, retinal tear repair x2   Miscarriage     x1   TONSILLECTOMY  01/27/1961   Family History  Problem Relation Age of Onset   Hypertension Mother    Hypercholesterolemia Mother    Anxiety disorder Mother    Arthritis Mother    Hearing loss Mother    Hyperlipidemia Mother    Heart disease Father    Hyperlipidemia Father    Hypercholesterolemia Father    Stroke Father    Dementia Father        Vascular dementia related to stroke history   Hearing loss Father    Hyperlipidemia Brother    Intellectual disability Brother    Learning disabilities Brother    Social History   Socioeconomic History   Marital status: Married    Spouse name: Not on file  Number of children: 0   Years of education: 16   Highest education level: Bachelor's degree (e.g., BA, AB, BS)  Occupational History   Not on file  Tobacco Use   Smoking status: Never   Smokeless tobacco: Never  Vaping Use   Vaping status: Never Used  Substance and Sexual Activity   Alcohol use: No    Alcohol/week: 0.0 standard drinks of alcohol   Drug use: No   Sexual activity: Not  on file  Other Topics Concern   Not on file  Social History Narrative   Right handed   Drinks one cup coffee am and one pepsi   Social Drivers of Health   Tobacco Use: Low Risk (02/14/2024)   Patient History    Smoking Tobacco Use: Never    Smokeless Tobacco Use: Never    Passive Exposure: Not on file  Financial Resource Strain: Low Risk (02/07/2024)   Overall Financial Resource Strain (CARDIA)    Difficulty of Paying Living Expenses: Not hard at all  Food Insecurity: No Food Insecurity (02/07/2024)   Epic    Worried About Programme Researcher, Broadcasting/film/video in the Last Year: Never true    Ran Out of Food in the Last Year: Never true  Transportation Needs: No Transportation Needs (02/07/2024)   Epic    Lack of Transportation (Medical): No    Lack of Transportation (Non-Medical): No  Physical Activity: Insufficiently Active (02/07/2024)   Exercise Vital Sign    Days of Exercise per Week: 1 day    Minutes of Exercise per Session: 30 min  Stress: No Stress Concern Present (02/07/2024)   [employer] of Occupational [employer] - Occupational Stress Questionnaire    Feeling of Stress: Only a little  Recent Concern: Stress - Stress Concern Present (12/17/2023)   [employer] of Occupational Health - Occupational Stress Questionnaire    Feeling of Stress: To some extent  Social Connections: Moderately Isolated (02/07/2024)   Social Connection and Isolation Panel    Frequency of Communication with Friends and Family: More than three times a week    Frequency of Social Gatherings with Friends and Family: Once a week    Attends Religious Services: Patient declined    Active Member of Clubs or Organizations: No    Attends Engineer, Structural: Not on file    Marital Status: Married  Depression (PHQ2-9): Low Risk (12/17/2023)   Depression (PHQ2-9)    PHQ-2 Score: 0  Alcohol Screen: Low Risk (02/07/2024)   Alcohol Screen    Last Alcohol Screening Score (AUDIT): 1  Housing: Low Risk  (02/07/2024)   Epic    Unable to Pay for Housing in the Last Year: No    Number of Times Moved in the Last Year: 0    Homeless in the Last Year: No  Utilities: Not At Risk (04/20/2023)   Received from [facility] Utilities    Threatened with loss of utilities: No  Health Literacy: Not on file     Review of Systems  Constitutional:  Negative for chills and fever.  HENT:  Positive for congestion and postnasal drip.        Scratchy throat.   Respiratory:  Positive for cough. Negative for chest tightness and shortness of breath.   Cardiovascular:  Negative for chest pain, palpitations and leg swelling.  Gastrointestinal:  Positive for nausea. Negative for abdominal pain, diarrhea and vomiting.  Genitourinary:  Negative for difficulty urinating and dysuria.  Musculoskeletal:  Negative for joint swelling and myalgias.  Skin:  Negative for color change and rash.  Neurological:  Negative for syncope.       Headache.   Psychiatric/Behavioral:  Negative for agitation.        Increased stress. Selling her mother's house.        Objective:     BP 126/76   Pulse 71   Temp 98.9 F (37.2 C) (Oral)   Ht 5' 7 (1.702 m)   Wt 169 lb 3.2 oz (76.7 kg)   LMP 01/13/2008   SpO2 95%   BMI 26.50 kg/m  Wt Readings from Last 3 Encounters:  02/08/24 169 lb 3.2 oz (76.7 kg)  12/17/23 168 lb 4 oz (76.3 kg)  10/22/23 168 lb (76.2 kg)    Physical Exam Vitals reviewed.  Constitutional:      General: She is not in acute distress.    Appearance: Normal appearance.  HENT:     Head: Normocephalic and atraumatic.     Right Ear: External ear normal.     Left Ear: External ear normal.  Eyes:     General: No scleral icterus.       Right eye: No discharge.        Left eye: No discharge.     Conjunctiva/sclera: Conjunctivae normal.  Neck:     Thyroid : No thyromegaly.  Cardiovascular:     Rate and Rhythm: Normal rate and regular rhythm.  Pulmonary:     Effort: No  respiratory distress.     Breath sounds: Normal breath sounds. No wheezing.  Abdominal:     General: Bowel sounds are normal.     Palpations: Abdomen is soft.     Tenderness: There is no abdominal tenderness.  Musculoskeletal:        General: No swelling or tenderness.     Cervical back: Neck supple. No tenderness.  Lymphadenopathy:     Cervical: No cervical adenopathy.  Skin:    Findings: No erythema or rash.  Neurological:     Mental Status: She is alert.  Psychiatric:        Mood and Affect: Mood normal.        Behavior: Behavior normal.         Outpatient Encounter Medications as of 02/08/2024  Medication Sig   albuterol  (VENTOLIN  HFA) 108 (90 Base) MCG/ACT inhaler TAKE 2 PUFFS BY MOUTH EVERY 6 HOURS AS NEEDED FOR WHEEZE OR SHORTNESS OF BREATH   ALPRAZolam (XANAX) 0.5 MG tablet Take by mouth.   Calcium  Citrate-Vitamin D  (CITRACAL + D PO) Take by mouth. Take one in the morning & 2 in the afternoon   diclofenac (VOLTAREN) 50 MG EC tablet Take 50 mg by mouth 2 (two) times daily.   DULoxetine (CYMBALTA) 20 MG capsule Take 20 mg by mouth daily.   DULoxetine (CYMBALTA) 60 MG capsule Take 60 mg by mouth daily.   Evolocumab  (REPATHA  SURECLICK) 140 MG/ML SOAJ Inject 140 mg into the skin every 14 (fourteen) days.   fluticasone  (FLONASE ) 50 MCG/ACT nasal spray SPRAY 2 SPRAYS INTO EACH NOSTRIL EVERY DAY   gabapentin (NEURONTIN) 300 MG capsule Take 300 mg by mouth 3 (three) times daily.   ipratropium (ATROVENT ) 0.03 % nasal spray PLACE 2 SPRAYS INTO BOTH NOSTRILS EVERY 12 (TWELVE) HOURS.   levothyroxine  (SYNTHROID ) 75 MCG tablet Take 1 tablet (75 mcg total) by mouth daily.   Multiple Vitamin (MULTIVITAMIN) tablet Take 1 tablet by mouth daily.   traZODone (DESYREL) 50 MG tablet 50  mg. Takes 1/2 tab po prn   triamcinolone  cream (KENALOG ) 0.1 % Apply 1 application. topically 2 (two) times daily. Until rash resolves   valACYclovir (VALTREX) 500 MG tablet Take one to two tablets by mouth  daily   vitamin B-12 (CYANOCOBALAMIN) 1000 MCG tablet Take 1 tablet (1,000 mcg total) by mouth daily.   pantoprazole  (PROTONIX ) 40 MG tablet Take 1 tablet (40 mg total) by mouth daily.   [DISCONTINUED] pantoprazole  (PROTONIX ) 40 MG tablet Take 1 tablet (40 mg total) by mouth daily.   No facility-administered encounter medications on file as of 02/08/2024.     Lab Results  Component Value Date   WBC 4.8 06/02/2023   HGB 13.0 06/02/2023   HCT 39.9 06/02/2023   PLT 260.0 06/02/2023   GLUCOSE 85 02/04/2024   CHOL 214 (H) 02/04/2024   TRIG 164.0 (H) 02/04/2024   HDL 69.70 02/04/2024   LDLDIRECT 213.0 05/27/2022   LDLCALC 112 (H) 02/04/2024   ALT 17 02/04/2024   AST 20 02/04/2024   NA 138 02/04/2024   K 4.4 02/05/2024   CL 104 02/04/2024   CREATININE 0.92 02/04/2024   BUN 16 02/04/2024   CO2 30 02/04/2024   TSH 4.23 02/04/2024   HGBA1C 5.7 02/04/2024       Assessment & Plan:  Flu-like symptoms -     POC COVID-19 BinaxNow -     POCT Influenza A/B  Hyperglycemia Assessment & Plan: Low carb diet and exercise. Follow met b and A1c.  Lab Results  Component Value Date   HGBA1C 5.7 02/04/2024     Orders: -     Hemoglobin A1c; Future -     Basic metabolic panel with GFR; Future  Hyperthyroidism Assessment & Plan: Off tapazole. On synthroid . Follow tsh.    Hypercholesterolemia Assessment & Plan: Low cholesterol diet and exercise as tolerated.  Off crestor  due to intolerance.  On repatha . Follow lipid panel.   Orders: -     Lipid panel; Future -     Basic metabolic panel with GFR; Future -     Hepatic function panel; Future  Unsteady gait Assessment & Plan: Persistent balance issues as outlined.   Saw neurology 05/13/23 - for evaluation of unsteady gait. Recommended MRI brain, MRI cervical spine and EMG/NCS. Recommended continuing PT and consideration of ENT evaluation or vestibular rehab if no neurological causes identified. MRI brain 05/25/23 - normal. MRI c-spine -  Multilevel degenerative changes without significant spinal canal stenosis.  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 Was also evaluated by ortho 04/23/23 - subacromial impingement (left) - recommended PT. Has been going to PT. NCS 07/08/23 -mildly abnormal with evidence of a mild sensorimotor polyneuropathy. Had f/u with neurology - 07/20/23 - referred to ENT. Recommended CTA head and neck - performed 09/09/23 - no brain abnormality present. No intracranial large vessel occlusion and no hemodynamically significant arterial stenosis in the neck. She does feel some improvement with PT. Continue PT and home exercise. Saw ENT 11/09/23 - recommended to schedule vestibular screening clinic. Evaluated 01/27/24 - low suspicion for BPPV, vestibular migraine, meniere's or inner ear pathology. Recommended restart PT.    Stress Assessment & Plan: Increased stress. Discussed. Seeing psychiatrist. Continues on cymbalta and trazodone - per psych. Overall stable. Follow.    Osteoporosis without current pathological fracture, unspecified osteoporosis type Assessment & Plan: Needs bone density. F/u and see if can schedule with mammogram.    Moderate episode  of recurrent major depressive disorder [facility]) Assessment & Plan: Followed by psychiatry. Overall appears to be relatively stable - on cymbalta and trazodone. Continue f/u with psych.    Gastroesophageal reflux disease, unspecified whether esophagitis present Assessment & Plan: EGD 12/22/22 - pathology - Barretts - negative dysplasia.  (Gastritis noted on EGD).  Continue protonix .    Encounter for screening mammogram for malignant neoplasm of breast Assessment & Plan: Overdue mammogram. See if agreeable to schedule.    Acute cough Assessment & Plan: Symptoms as outlined. Husband diagnosed recently with influenza A. Flu and covid swabs - negative. Treat symptoms. Flonase  nasal spray and  atrovent  nasal spray. Robitussin DM if needed. Follow.  Call with update.    Other orders -     Pantoprazole  Sodium; Take 1 tablet (40 mg total) by mouth daily.  Dispense: 90 tablet; Refill: 1     Allena Hamilton, MD "

## 2024-02-09 ENCOUNTER — Ambulatory Visit: Payer: Self-pay | Admitting: Internal Medicine

## 2024-02-14 ENCOUNTER — Encounter: Payer: Self-pay | Admitting: Internal Medicine

## 2024-02-14 DIAGNOSIS — R059 Cough, unspecified: Secondary | ICD-10-CM | POA: Insufficient documentation

## 2024-02-14 NOTE — Assessment & Plan Note (Signed)
 Low cholesterol diet and exercise as tolerated.  Off crestor due to intolerance.  On repatha. Follow lipid panel.

## 2024-02-14 NOTE — Assessment & Plan Note (Signed)
 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  (Gastritis noted on EGD).  Continue protonix .

## 2024-02-14 NOTE — Assessment & Plan Note (Signed)
Off tapazole.  On synthroid.  Follow tsh.  °

## 2024-02-14 NOTE — Assessment & Plan Note (Signed)
 Needs bone density. F/u and see if can schedule with mammogram.

## 2024-02-14 NOTE — Assessment & Plan Note (Signed)
 Increased stress. Discussed. Seeing psychiatrist. Continues on cymbalta and trazodone - per psych. Overall stable. Follow.

## 2024-02-14 NOTE — Assessment & Plan Note (Signed)
 Overdue mammogram. See if agreeable to schedule.

## 2024-02-14 NOTE — Assessment & Plan Note (Signed)
 Low carb diet and exercise. Follow met b and A1c.  Lab Results  Component Value Date   HGBA1C 5.7 02/04/2024

## 2024-02-14 NOTE — Assessment & Plan Note (Signed)
 Symptoms as outlined. Husband diagnosed recently with influenza A. Flu and covid swabs - negative. Treat symptoms. Flonase  nasal spray and atrovent  nasal spray. Robitussin DM if needed. Follow.  Call with update.

## 2024-02-14 NOTE — Assessment & Plan Note (Signed)
 Persistent balance issues as outlined.   Saw neurology 05/13/23 - for evaluation of unsteady gait. Recommended MRI brain, MRI cervical spine and EMG/NCS. Recommended continuing PT and consideration of ENT evaluation or vestibular rehab if no neurological causes identified. MRI brain 05/25/23 - normal. MRI c-spine - Multilevel degenerative changes without significant spinal canal stenosis.  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 Was also evaluated by ortho 04/23/23 - subacromial impingement (left) - recommended PT. Has been going to PT. NCS 07/08/23 -mildly abnormal with evidence of a mild sensorimotor polyneuropathy. Had f/u with neurology - 07/20/23 - referred to ENT. Recommended CTA head and neck - performed 09/09/23 - no brain abnormality present. No intracranial large vessel occlusion and no hemodynamically significant arterial stenosis in the neck. She does feel some improvement with PT. Continue PT and home exercise. Saw ENT 11/09/23 - recommended to schedule vestibular screening clinic. Evaluated 01/27/24 - low suspicion for BPPV, vestibular migraine, meniere's or inner ear pathology. Recommended restart PT.

## 2024-02-14 NOTE — Assessment & Plan Note (Signed)
 Followed by psychiatry. Overall appears to be relatively stable - on cymbalta and trazodone. Continue f/u with psych.

## 2024-05-13 ENCOUNTER — Other Ambulatory Visit

## 2024-05-17 ENCOUNTER — Ambulatory Visit: Admitting: Internal Medicine

## 2024-12-26 ENCOUNTER — Ambulatory Visit
